# Patient Record
Sex: Female | Born: 1965 | Race: White | Hispanic: No | Marital: Married | Smoking: Former smoker
Health system: Southern US, Community
[De-identification: ages and names within clinical notes are randomized; demographics above are authoritative.]

## PROBLEM LIST (undated history)

## (undated) DIAGNOSIS — U071 COVID-19: Secondary | ICD-10-CM

## (undated) DIAGNOSIS — N200 Calculus of kidney: Secondary | ICD-10-CM

## (undated) DIAGNOSIS — R112 Nausea with vomiting, unspecified: Secondary | ICD-10-CM

## (undated) DIAGNOSIS — G43909 Migraine, unspecified, not intractable, without status migrainosus: Secondary | ICD-10-CM

## (undated) DIAGNOSIS — Z9889 Other specified postprocedural states: Secondary | ICD-10-CM

## (undated) DIAGNOSIS — I509 Heart failure, unspecified: Secondary | ICD-10-CM

## (undated) DIAGNOSIS — I10 Essential (primary) hypertension: Secondary | ICD-10-CM

## (undated) DIAGNOSIS — K589 Irritable bowel syndrome without diarrhea: Secondary | ICD-10-CM

## (undated) DIAGNOSIS — Z22322 Carrier or suspected carrier of Methicillin resistant Staphylococcus aureus: Secondary | ICD-10-CM

## (undated) DIAGNOSIS — J189 Pneumonia, unspecified organism: Secondary | ICD-10-CM

## (undated) DIAGNOSIS — N390 Urinary tract infection, site not specified: Secondary | ICD-10-CM

## (undated) DIAGNOSIS — E039 Hypothyroidism, unspecified: Secondary | ICD-10-CM

## (undated) DIAGNOSIS — K5792 Diverticulitis of intestine, part unspecified, without perforation or abscess without bleeding: Secondary | ICD-10-CM

## (undated) DIAGNOSIS — E079 Disorder of thyroid, unspecified: Secondary | ICD-10-CM

## (undated) DIAGNOSIS — Z87442 Personal history of urinary calculi: Secondary | ICD-10-CM

## (undated) DIAGNOSIS — E785 Hyperlipidemia, unspecified: Secondary | ICD-10-CM

## (undated) DIAGNOSIS — K219 Gastro-esophageal reflux disease without esophagitis: Secondary | ICD-10-CM

## (undated) DIAGNOSIS — E119 Type 2 diabetes mellitus without complications: Secondary | ICD-10-CM

## (undated) HISTORY — PX: TUBAL LIGATION: SHX77

## (undated) HISTORY — DX: Disorder of thyroid, unspecified: E07.9

## (undated) HISTORY — DX: Irritable bowel syndrome, unspecified: K58.9

## (undated) HISTORY — DX: Diverticulitis of intestine, part unspecified, without perforation or abscess without bleeding: K57.92

## (undated) HISTORY — PX: OTHER SURGICAL HISTORY: SHX169

## (undated) HISTORY — DX: Urinary tract infection, site not specified: N39.0

## (undated) HISTORY — DX: COVID-19: U07.1

## (undated) HISTORY — DX: Hyperlipidemia, unspecified: E78.5

## (undated) HISTORY — PX: TYMPANOPLASTY W/ MASTOIDECTOMY: SUR1400

## (undated) HISTORY — DX: Type 2 diabetes mellitus without complications: E11.9

## (undated) HISTORY — DX: Gastro-esophageal reflux disease without esophagitis: K21.9

## (undated) HISTORY — DX: Migraine, unspecified, not intractable, without status migrainosus: G43.909

## (undated) HISTORY — PX: WISDOM TOOTH EXTRACTION: SHX21

---
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
## 2001-08-12 HISTORY — PX: FUNCTIONAL ENDOSCOPIC SINUS SURGERY: SUR616

## 2017-01-16 ENCOUNTER — Encounter: Payer: Self-pay | Admitting: Nurse Practitioner

## 2017-01-16 ENCOUNTER — Ambulatory Visit (INDEPENDENT_AMBULATORY_CARE_PROVIDER_SITE_OTHER): Payer: Managed Care, Other (non HMO) | Admitting: Nurse Practitioner

## 2017-01-16 ENCOUNTER — Other Ambulatory Visit (INDEPENDENT_AMBULATORY_CARE_PROVIDER_SITE_OTHER): Payer: Managed Care, Other (non HMO)

## 2017-01-16 VITALS — BP 98/66 | HR 84 | Temp 97.5°F | Ht 62.0 in | Wt 164.0 lb

## 2017-01-16 DIAGNOSIS — R5383 Other fatigue: Secondary | ICD-10-CM | POA: Diagnosis not present

## 2017-01-16 DIAGNOSIS — G629 Polyneuropathy, unspecified: Secondary | ICD-10-CM | POA: Insufficient documentation

## 2017-01-16 DIAGNOSIS — M2560 Stiffness of unspecified joint, not elsewhere classified: Secondary | ICD-10-CM

## 2017-01-16 DIAGNOSIS — N951 Menopausal and female climacteric states: Secondary | ICD-10-CM

## 2017-01-16 DIAGNOSIS — M2550 Pain in unspecified joint: Secondary | ICD-10-CM

## 2017-01-16 DIAGNOSIS — E1140 Type 2 diabetes mellitus with diabetic neuropathy, unspecified: Secondary | ICD-10-CM | POA: Diagnosis not present

## 2017-01-16 DIAGNOSIS — Z780 Asymptomatic menopausal state: Secondary | ICD-10-CM

## 2017-01-16 DIAGNOSIS — E119 Type 2 diabetes mellitus without complications: Secondary | ICD-10-CM | POA: Insufficient documentation

## 2017-01-16 DIAGNOSIS — E538 Deficiency of other specified B group vitamins: Secondary | ICD-10-CM

## 2017-01-16 DIAGNOSIS — E559 Vitamin D deficiency, unspecified: Secondary | ICD-10-CM | POA: Insufficient documentation

## 2017-01-16 DIAGNOSIS — K219 Gastro-esophageal reflux disease without esophagitis: Secondary | ICD-10-CM

## 2017-01-16 DIAGNOSIS — E039 Hypothyroidism, unspecified: Secondary | ICD-10-CM

## 2017-01-16 DIAGNOSIS — N3010 Interstitial cystitis (chronic) without hematuria: Secondary | ICD-10-CM

## 2017-01-16 DIAGNOSIS — G43009 Migraine without aura, not intractable, without status migrainosus: Secondary | ICD-10-CM | POA: Diagnosis not present

## 2017-01-16 LAB — COMPREHENSIVE METABOLIC PANEL
ALT: 38 U/L — ABNORMAL HIGH (ref 0–35)
AST: 25 U/L (ref 0–37)
Albumin: 4.6 g/dL (ref 3.5–5.2)
Alkaline Phosphatase: 89 U/L (ref 39–117)
BUN: 14 mg/dL (ref 6–23)
CO2: 25 mEq/L (ref 19–32)
Calcium: 10 mg/dL (ref 8.4–10.5)
Chloride: 105 mEq/L (ref 96–112)
Creatinine, Ser: 0.67 mg/dL (ref 0.40–1.20)
GFR: 99.09 mL/min (ref >60.00)
Glucose, Bld: 165 mg/dL — ABNORMAL HIGH (ref 70–99)
Potassium: 3.9 mEq/L (ref 3.5–5.1)
Sodium: 139 mEq/L (ref 135–145)
Total Bilirubin: 0.4 mg/dL (ref 0.2–1.2)
Total Protein: 7.3 g/dL (ref 6.0–8.3)

## 2017-01-16 LAB — CBC WITH DIFFERENTIAL/PLATELET
Basophils Absolute: 0 10*3/uL (ref 0.0–0.1)
Basophils Relative: 0.7 % (ref 0.0–3.0)
Eosinophils Absolute: 0.2 10*3/uL (ref 0.0–0.7)
Eosinophils Relative: 3.1 % (ref 0.0–5.0)
HCT: 43.6 % (ref 36.0–46.0)
Hemoglobin: 14.9 g/dL (ref 12.0–15.0)
Lymphocytes Relative: 33.4 % (ref 12.0–46.0)
Lymphs Abs: 2.2 10*3/uL (ref 0.7–4.0)
MCHC: 34.3 g/dL (ref 30.0–36.0)
MCV: 92.7 fl (ref 78.0–100.0)
Monocytes Absolute: 0.5 10*3/uL (ref 0.1–1.0)
Monocytes Relative: 7.8 % (ref 3.0–12.0)
Neutro Abs: 3.7 10*3/uL (ref 1.4–7.7)
Neutrophils Relative %: 55 % (ref 43.0–77.0)
Platelets: 329 10*3/uL (ref 150.0–400.0)
RBC: 4.7 Mil/uL (ref 3.87–5.11)
RDW: 13.1 % (ref 11.5–15.5)
WBC: 6.7 10*3/uL (ref 4.0–10.5)

## 2017-01-16 LAB — VITAMIN B12: Vitamin B-12: 365 pg/mL (ref 211–911)

## 2017-01-16 LAB — HEMOGLOBIN A1C: Hgb A1c MFr Bld: 7.1 % — ABNORMAL HIGH (ref 4.6–6.5)

## 2017-01-16 LAB — SEDIMENTATION RATE: Sed Rate: 6 mm/hr (ref 0–20)

## 2017-01-16 LAB — T4, FREE: Free T4: 0.66 ng/dL (ref 0.60–1.60)

## 2017-01-16 LAB — URIC ACID: Uric Acid, Serum: 4.8 mg/dL (ref 2.4–7.0)

## 2017-01-16 LAB — TSH: TSH: 0.25 u[IU]/mL — ABNORMAL LOW (ref 0.35–4.50)

## 2017-01-16 MED ORDER — BUTALBITAL-APAP-CAFFEINE 50-300-40 MG PO CAPS: 1.0000 | ORAL_CAPSULE | Freq: Four times a day (QID) | ORAL | 0 refills | Status: DC | PRN

## 2017-01-16 MED ORDER — OMEPRAZOLE 40 MG PO CPDR: 40.0000 mg | DELAYED_RELEASE_CAPSULE | Freq: Every day | ORAL | 0 refills | Status: DC

## 2017-01-16 NOTE — Progress Notes (Signed)
Subjective:    Patient ID: Caitlin Barnett, female    DOB: Jan 05, 1966, 51 y.o.   MRN: [id number]  Patient presents today for establish care (new patient)  HPI  joint pain and stiffness, swelling  x 3weeks. She is concerned about autoimmune cause.  Synthroid changed to levothyroxine 3weeks ago.  Labs last done 11/2016 by previous pcp: Last HgbA1c 6.14, Vit. B12 300. (per patient)  Moved from [place] to [place] 5days ago.  Report hx of interstitial cystitis Will like referral to urology.  Immunizations: (TDAP, Hep C screen, Pneumovax, Influenza, zoster)  Health Maintenance  Topic Date Due  . Pneumococcal vaccine (1) 03/22/1969  . Complete foot exam   03/22/1977  . Eye exam for diabetics  03/22/1977  . Urine Protein Check  03/22/1977  . HIV Screening  03/22/1982  . Pap Smear  03/22/1988  . Tetanus Vaccine  01/16/2018*  . Flu Shot  03/12/2017  . Hemoglobin A1C  07/18/2017  *Topic was postponed. The date shown is not the original due date.   Diet:regular.  Weight:  Wt Readings from Last 3 Encounters:  01/16/17 164 lb (74.4 kg)   Exercise:none.  Fall Risk: Fall Risk  01/16/2017  Falls in the past year? No   Home Safety:home with husband.  Depression/Suicide: Depression screen PHQ 2/9 01/16/2017  Decreased Interest 0  Down, Depressed, Hopeless 0  PHQ - 2 Score 0   Pap Smear (every 54yr for >21-29 without HPV, every 546yrfor >30-6531yrith HPV):needed, no hx of abnormal.  Mammogram (yearly, >45y40yreeded, no hx of abnormal.  Sexual History (birth control, marital status, STD):married, sexually active, works as HomeOccupational hygienistedications and allergies reviewed with patient and updated if appropriate.  Patient Active Problem List   Diagnosis Date Noted  . Hypothyroidism 01/16/2017  . Type 2 diabetes mellitus with diabetic neuropathy, without long-term current use of insulin (HCC)[place]/02/2017  . Vitamin D deficiency 01/16/2017  . Postmenopausal 01/16/2017  . Hot  flashes due to menopause 01/16/2017  . Vitamin B12 deficiency 01/16/2017  . Interstitial cystitis 01/16/2017  . Peripheral neuropathy 01/16/2017    No current outpatient prescriptions on file prior to visit.   No current facility-administered medications on file prior to visit.     Past Medical History:  Diagnosis Date  . Diabetes mellitus without complication (HCC)[place]. Diverticulitis   . GERD (gastroesophageal reflux disease)   . Hyperlipidemia   . IBS (irritable bowel syndrome)   . Migraine   . Thyroid disease   . UTI (urinary tract infection)     Past Surgical History:  Procedure Laterality Date  . CESAREAN SECTION  1993  . cystoscopies     multiple  . FUNCTIONAL ENDOSCOPIC SINUS SURGERY  2003  . LAPAROSCOPIC OVARIAN CYSTECTOMY  1983  . TONSILLECTOMY  1973  . TYMPANOPLASTY W/ MASTOIDECTOMY Left 2016 & 2017    Social History   Social History  . Marital status: Married    Spouse name: N/A  . Number of children: N/A  . Years of education: N/A   Social History Main Topics  . Smoking status: Current Every Day Smoker    Types: Cigarettes  . Smokeless tobacco: Never Used  . Alcohol use No  . Drug use: No  . Sexual activity: Not Asked   Other Topics Concern  . None   Social History Narrative  . None    Family History  Problem Relation Age of Onset  . Mental illness Mother  depression  . Alcohol abuse Father   . Kidney disease Father   . Mental illness Father        depression  . Cancer Maternal Aunt        breast cancer  . Heart disease Maternal Grandmother         Review of Systems  Constitutional: Positive for malaise/fatigue. Negative for fever and weight loss.  HENT: Negative for congestion and sore throat.   Eyes:       Negative for visual changes  Respiratory: Negative for cough and shortness of breath.   Cardiovascular: Negative for chest pain, palpitations and leg swelling.  Gastrointestinal: Negative for blood in stool,  constipation, diarrhea and heartburn.  Genitourinary: Negative for dysuria, frequency and urgency.  Musculoskeletal: Positive for joint pain, myalgias and neck pain. Negative for falls.  Skin: Negative for rash.  Neurological: Negative for dizziness, sensory change and headaches.  Endo/Heme/Allergies: Does not bruise/bleed easily.  Psychiatric/Behavioral: Negative for depression, substance abuse and suicidal ideas. The patient is not nervous/anxious.     Objective:   Vitals:   01/16/17 1118  BP: 98/66  Pulse: 84  Temp: 97.5 F (36.4 C)    Body mass index is 30 kg/m.   Physical Examination:  Physical Exam  Constitutional: She is oriented to person, place, and time and well-developed, well-nourished, and in no distress. No distress.  HENT:  Right Ear: External ear normal.  Left Ear: External ear normal.  Nose: Nose normal.  Mouth/Throat: Oropharynx is clear and moist. No oropharyngeal exudate.  Eyes: Conjunctivae and EOM are normal. Pupils are equal, round, and reactive to light. No scleral icterus.  Neck: Normal range of motion. Neck supple. No thyromegaly present.  Cardiovascular: Normal rate, normal heart sounds and intact distal pulses.   Pulmonary/Chest: Effort normal and breath sounds normal. She exhibits no tenderness.  Abdominal: Soft. Bowel sounds are normal. She exhibits no distension. There is no tenderness.  Musculoskeletal: Normal range of motion. She exhibits no edema or tenderness.  Lymphadenopathy:    She has no cervical adenopathy.  Neurological: She is alert and oriented to person, place, and time. Gait normal.  Skin: Skin is warm and dry.  Psychiatric: Affect and judgment normal.    ASSESSMENT and PLAN:  Suriya was seen today for establish care.  Diagnoses and all orders for this visit:  Joint stiffness -     CBC w/Diff; Future -     Sed Rate (ESR); Future -     Uric acid; Future -     Rheumatoid Factor; Future -     Comprehensive metabolic  panel; Future -     CK Total (and CKMB); Future  Arthralgia, unspecified joint -     CBC w/Diff; Future -     Sed Rate (ESR); Future -     Uric acid; Future -     Rheumatoid Factor; Future -     Comprehensive metabolic panel; Future -     CK Total (and CKMB); Future  Type 2 diabetes mellitus with diabetic neuropathy, without long-term current use of insulin (HCC) -     Hemoglobin A1c; Future  Hypothyroidism, unspecified type -     TSH; Future -     T4, free; Future  Vitamin D deficiency -     Vitamin D 1,25 dihydroxy; Future  Fatigue, unspecified type -     CBC w/Diff; Future -     Sed Rate (ESR); Future -     Comprehensive metabolic panel;  Future -     CK Total (and CKMB); Future  Vitamin B12 deficiency -     B12; Future  Interstitial cystitis -     Ambulatory referral to Urology  Peripheral polyneuropathy  Postmenopausal  Hot flashes due to menopause  Migraine without aura and without status migrainosus, not intractable -     Butalbital-APAP-Caffeine (FIORICET) 50-300-40 MG CAPS; Take 1 tablet by mouth 4 (four) times daily as needed. migrains  Gastroesophageal reflux disease without esophagitis -     omeprazole (PRILOSEC) 40 MG capsule; Take 1 capsule (40 mg total) by mouth daily.   No problem-specific Assessment & Plan notes found for this encounter.     Follow up: Return in about 4 weeks (around 02/13/2017) for CPE (fasting, breast and pap needed).  Wilfred Lacy, NP

## 2017-01-16 NOTE — Patient Instructions (Signed)
Go to basement for blood draw. You will be contacted with results. 

## 2017-01-17 LAB — CK TOTAL AND CKMB (NOT AT ARMC)
CK, MB: <0.7 ng/mL (ref 0.0–5.0)
Total CK: 42 U/L (ref 29–143)

## 2017-01-17 LAB — RHEUMATOID FACTOR: Rhuematoid fact SerPl-aCnc: <14 IU/mL (ref <14)

## 2017-01-21 ENCOUNTER — Telehealth: Payer: Self-pay | Admitting: Nurse Practitioner

## 2017-01-21 DIAGNOSIS — Z780 Asymptomatic menopausal state: Secondary | ICD-10-CM

## 2017-01-21 DIAGNOSIS — N951 Menopausal and female climacteric states: Secondary | ICD-10-CM

## 2017-01-21 LAB — VITAMIN D 1,25 DIHYDROXY
Vitamin D 1, 25 (OH)2 Total: 41 pg/mL (ref 18–72)
Vitamin D2 1, 25 (OH)2: <8 pg/mL
Vitamin D3 1, 25 (OH)2: 41 pg/mL

## 2017-01-21 MED ORDER — CONJ ESTROG-MEDROXYPROGEST ACE 0.45-1.5 MG PO TABS: 1.0000 | ORAL_TABLET | Freq: Every day | ORAL | 1 refills | Status: DC

## 2017-01-21 NOTE — Telephone Encounter (Signed)
rx sent

## 2017-01-21 NOTE — Telephone Encounter (Signed)
Please call patient

## 2017-01-21 NOTE — Telephone Encounter (Signed)
Pt called in and would someone to call her about her lab results   She also has info about her [facility] [address]

## 2017-01-24 ENCOUNTER — Telehealth: Payer: Self-pay | Admitting: Nurse Practitioner

## 2017-01-24 NOTE — Telephone Encounter (Signed)
ROI fax to Dr.Richard Alesia Bandaizzo

## 2017-01-28 ENCOUNTER — Encounter: Payer: Self-pay | Admitting: Nurse Practitioner

## 2017-01-28 ENCOUNTER — Ambulatory Visit (INDEPENDENT_AMBULATORY_CARE_PROVIDER_SITE_OTHER): Payer: Managed Care, Other (non HMO) | Admitting: Nurse Practitioner

## 2017-01-28 VITALS — BP 96/58 | HR 84 | Temp 98.8°F | Ht 62.0 in | Wt 168.0 lb

## 2017-01-28 DIAGNOSIS — R5383 Other fatigue: Secondary | ICD-10-CM

## 2017-01-28 DIAGNOSIS — M2560 Stiffness of unspecified joint, not elsewhere classified: Secondary | ICD-10-CM | POA: Diagnosis not present

## 2017-01-28 DIAGNOSIS — M2550 Pain in unspecified joint: Secondary | ICD-10-CM | POA: Diagnosis not present

## 2017-01-28 MED ORDER — MELOXICAM 7.5 MG PO TABS: 7.5000 mg | ORAL_TABLET | Freq: Every day | ORAL | 0 refills | Status: DC | PRN

## 2017-01-28 MED ORDER — PREGABALIN 75 MG PO CAPS: 75.0000 mg | ORAL_CAPSULE | Freq: Two times a day (BID) | ORAL | 0 refills | Status: DC

## 2017-01-28 NOTE — Patient Instructions (Addendum)
You will be contacted to schedule appt with rheumatology.  Stop gabapentin, and start lyrica.  Reschedule CPE to match follow up visit today.  Pregabalin capsules What is this medicine? PREGABALIN (pre GAB a lin) is used to treat nerve pain from diabetes, shingles, spinal cord injury, and fibromyalgia. It is also used to control seizures in epilepsy. This medicine may be used for other purposes; ask your health care provider or pharmacist if you have questions. COMMON BRAND NAME(S): Lyrica What should I tell my health care provider before I take this medicine? They need to know if you have any of these conditions: -bleeding problems -heart disease, including heart failure -history of alcohol or drug abuse -kidney disease -suicidal thoughts, plans, or attempt; a previous suicide attempt by you or a family member -an unusual or allergic reaction to pregabalin, gabapentin, other medicines, foods, dyes, or preservatives -pregnant or trying to get pregnant or trying to conceive with your partner -breast-feeding How should I use this medicine? Take this medicine by mouth with a glass of water. Follow the directions on the prescription label. You can take this medicine with or without food. Take your doses at regular intervals. Do not take your medicine more often than directed. Do not stop taking except on your doctor's advice. A special MedGuide will be given to you by the pharmacist with each prescription and refill. Be sure to read this information carefully each time. Talk to your pediatrician regarding the use of this medicine in children. Special care may be needed. Overdosage: If you think you have taken too much of this medicine contact a poison control center or emergency room at once. NOTE: This medicine is only for you. Do not share this medicine with others. What if I miss a dose? If you miss a dose, take it as soon as you can. If it is almost time for your next dose, take only that  dose. Do not take double or extra doses. What may interact with this medicine? -alcohol -certain medicines for blood pressure like captopril, enalapril, or lisinopril -certain medicines for diabetes, like pioglitazone or rosiglitazone -certain medicines for anxiety or sleep -narcotic medicines for pain This list may not describe all possible interactions. Give your health care provider a list of all the medicines, herbs, non-prescription drugs, or dietary supplements you use. Also tell them if you smoke, drink alcohol, or use illegal drugs. Some items may interact with your medicine. What should I watch for while using this medicine? Tell your doctor or healthcare professional if your symptoms do not start to get better or if they get worse. Visit your doctor or health care professional for regular checks on your progress. Do not stop taking except on your doctor's advice. You may develop a severe reaction. Your doctor will tell you how much medicine to take. Wear a medical identification bracelet or chain if you are taking this medicine for seizures, and carry a card that describes your disease and details of your medicine and dosage times. You may get drowsy or dizzy. Do not drive, use machinery, or do anything that needs mental alertness until you know how this medicine affects you. Do not stand or sit up quickly, especially if you are an older patient. This reduces the risk of dizzy or fainting spells. Alcohol may interfere with the effect of this medicine. Avoid alcoholic drinks. If you have a heart condition, like congestive heart failure, and notice that you are retaining water and have swelling in your hands  or feet, contact your health care provider immediately. The use of this medicine may increase the chance of suicidal thoughts or actions. Pay special attention to how you are responding while on this medicine. Any worsening of mood, or thoughts of suicide or dying should be reported to your  health care professional right away. This medicine has caused reduced sperm counts in some men. This may interfere with the ability to father a child. You should talk to your doctor or health care professional if you are concerned about your fertility. Women who become pregnant while using this medicine for seizures may enroll in the [place] American Antiepileptic Drug Pregnancy Registry by calling ([phone]. This registry collects information about the safety of antiepileptic drug use during pregnancy. What side effects may I notice from receiving this medicine? Side effects that you should report to your doctor or health care professional as soon as possible: -allergic reactions like skin rash, itching or hives, swelling of the face, lips, or tongue -breathing problems -changes in vision -chest pain -confusion -jerking or unusual movements of any part of your body -loss of memory -muscle pain, tenderness, or weakness -suicidal thoughts or other mood changes -swelling of the ankles, feet, hands -unusual bruising or bleeding Side effects that usually do not require medical attention (report to your doctor or health care professional if they continue or are bothersome): -dizziness -drowsiness -dry mouth -headache -nausea -tremors -trouble sleeping -weight gain This list may not describe all possible side effects. Call your doctor for medical advice about side effects. You may report side effects to FDA at 1-800-FDA-1088. Where should I keep my medicine? Keep out of the reach of children. This medicine can be abused. Keep your medicine in a safe place to protect it from theft. Do not share this medicine with anyone. Selling or giving away this medicine is dangerous and against the law. This medicine may cause accidental overdose and death if it taken by other adults, children, or pets. Mix any unused medicine with a substance like cat litter or coffee grounds. Then throw the medicine  away in a sealed container like a sealed bag or a coffee can with a lid. Do not use the medicine after the expiration date. Store at room temperature between 15 and 30 degrees C (59 and 86 degrees F). NOTE: This sheet is a summary. It may not cover all possible information. If you have questions about this medicine, talk to your doctor, pharmacist, or health care provider.  2018 Elsevier/Gold Standard (2015-08-31 10:26:12)

## 2017-01-28 NOTE — Progress Notes (Signed)
Subjective:  Patient ID: Caitlin Barnett, female    DOB: 03/14/1966  Age: 51 y.o. MRN: [id number]  CC: Follow-up (go over lab and joint pain consult--still in pain but the lab was [facility])   HPI  Joint pain: Worsening in last 2months. No improvement with gabapentin QID. Interfering with ADLs. Takes 1hour to get ready in morning. No paresthesia.  Outpatient Medications Prior to Visit  Medication Sig Dispense Refill  . Butalbital-APAP-Caffeine (FIORICET) 50-300-40 MG CAPS Take 1 tablet by mouth 4 (four) times daily as needed. migrains 30 capsule 0  . cetirizine (ZYRTEC) 10 MG tablet Take 10 mg by mouth daily.    Marland Kitchen. estrogen, conjugated,-medroxyprogesterone (PREMPRO) 0.45-1.5 MG tablet Take 1 tablet by mouth daily. 30 tablet 1  . fluticasone (FLONASE) 50 MCG/ACT nasal spray Place into both nostrils daily.    Marland Kitchen. levothyroxine (SYNTHROID, LEVOTHROID) 125 MCG tablet Take 125 mcg by mouth daily before breakfast.    . metFORMIN (GLUCOPHAGE) 500 MG tablet Take by mouth 2 (two) times daily with a meal.    . nitrofurantoin (MACRODANTIN) 100 MG capsule Take 100 mg by mouth 4 (four) times daily. 3 times a week PRN    . omeprazole (PRILOSEC) 40 MG capsule Take 1 capsule (40 mg total) by mouth daily. 90 capsule 0  . gabapentin (NEURONTIN) 300 MG capsule Take 300 mg by mouth 4 (four) times daily.    . cyanocobalamin 1000 MCG tablet Take 1,000 mcg by mouth daily. Once monthly     No facility-administered medications prior to visit.     ROS See HPI  Objective:  BP (!) 96/58   Pulse 84   Temp 98.8 F (37.1 C)   Ht 5\' 2"  (1.575 m)   Wt 168 lb (76.2 kg)   SpO2 99%   BMI 30.73 kg/m   BP Readings from Last 3 Encounters:  01/28/17 (!) 96/58  01/16/17 98/66    Wt Readings from Last 3 Encounters:  01/28/17 168 lb (76.2 kg)  01/16/17 164 lb (74.4 kg)    Physical Exam  Constitutional: She is oriented to person, place, and time. No distress.  Eyes: No scleral icterus.  Neck: Normal range of  motion. Neck supple.  Cardiovascular: Normal rate.   Pulmonary/Chest: Effort normal. No respiratory distress.  Musculoskeletal: She exhibits no edema.  Neurological: She is alert and oriented to person, place, and time.  Skin: Skin is warm and dry. No rash noted. No erythema.  Vitals reviewed.   Lab Results  Component Value Date   WBC 6.7 01/16/2017   HGB 14.9 01/16/2017   HCT 43.6 01/16/2017   PLT 329.0 01/16/2017   GLUCOSE 165 (H) 01/16/2017   ALT 38 (H) 01/16/2017   AST 25 01/16/2017   NA 139 01/16/2017   K 3.9 01/16/2017   CL 105 01/16/2017   CREATININE 0.67 01/16/2017   BUN 14 01/16/2017   CO2 25 01/16/2017   TSH 0.25 (L) 01/16/2017   HGBA1C 7.1 (H) 01/16/2017    Patient was never admitted.  Assessment & Plan:   Caitlin Barnett was seen today for follow-up.  Diagnoses and all orders for this visit:  Arthralgia, unspecified joint -     meloxicam (MOBIC) 7.5 MG tablet; Take 1 tablet (7.5 mg total) by mouth daily as needed for pain. -     pregabalin (LYRICA) 75 MG capsule; Take 1 capsule (75 mg total) by mouth 2 (two) times daily. -     Ambulatory referral to Rheumatology  Joint stiffness -  meloxicam (MOBIC) 7.5 MG tablet; Take 1 tablet (7.5 mg total) by mouth daily as needed for pain. -     pregabalin (LYRICA) 75 MG capsule; Take 1 capsule (75 mg total) by mouth 2 (two) times daily. -     Ambulatory referral to Rheumatology  Fatigue, unspecified type -     meloxicam (MOBIC) 7.5 MG tablet; Take 1 tablet (7.5 mg total) by mouth daily as needed for pain. -     pregabalin (LYRICA) 75 MG capsule; Take 1 capsule (75 mg total) by mouth 2 (two) times daily. -     Ambulatory referral to Rheumatology   I have discontinued Caitlin Barnett gabapentin. I am also having her start on meloxicam and pregabalin. Additionally, I am having her maintain her metFORMIN, cyanocobalamin, levothyroxine, cetirizine, fluticasone, nitrofurantoin, omeprazole, Butalbital-APAP-Caffeine, estrogen  (conjugated)-medroxyprogesterone, and cyanocobalamin.  Meds ordered this encounter  Medications  . cyanocobalamin (,VITAMIN B-12,) 1000 MCG/ML injection    Sig: Inject 1,000 mcg into the muscle.    Refill:  11  . meloxicam (MOBIC) 7.5 MG tablet    Sig: Take 1 tablet (7.5 mg total) by mouth daily as needed for pain.    Dispense:  30 tablet    Refill:  0    Order Specific Question:   Supervising Provider    Answer:   Tresa Garter [[id number]]  . pregabalin (LYRICA) 75 MG capsule    Sig: Take 1 capsule (75 mg total) by mouth 2 (two) times daily.    Dispense:  60 capsule    Refill:  0    Order Specific Question:   Supervising Provider    Answer:   Tresa Garter [[id number]]    Follow-up: Return in about 4 weeks (around 02/25/2017) for fatigue, joint and muscle pain.  Alysia Penna, NP

## 2017-02-07 ENCOUNTER — Ambulatory Visit: Payer: Managed Care, Other (non HMO) | Admitting: Nurse Practitioner

## 2017-02-24 ENCOUNTER — Ambulatory Visit (INDEPENDENT_AMBULATORY_CARE_PROVIDER_SITE_OTHER): Payer: Managed Care, Other (non HMO) | Admitting: Nurse Practitioner

## 2017-02-24 ENCOUNTER — Other Ambulatory Visit (HOSPITAL_COMMUNITY)
Admission: RE | Admit: 2017-02-24 | Discharge: 2017-02-24 | Disposition: A | Discharge status: Home / Self Care | Payer: Managed Care, Other (non HMO) | Source: Ambulatory Visit | Attending: Nurse Practitioner | Admitting: Nurse Practitioner

## 2017-02-24 ENCOUNTER — Other Ambulatory Visit: Payer: Managed Care, Other (non HMO)

## 2017-02-24 ENCOUNTER — Encounter: Payer: Self-pay | Admitting: Nurse Practitioner

## 2017-02-24 VITALS — BP 100/68 | HR 88 | Temp 98.3°F | Ht 62.0 in | Wt 170.0 lb

## 2017-02-24 DIAGNOSIS — H66005 Acute suppurative otitis media without spontaneous rupture of ear drum, recurrent, left ear: Secondary | ICD-10-CM

## 2017-02-24 DIAGNOSIS — Z124 Encounter for screening for malignant neoplasm of cervix: Secondary | ICD-10-CM | POA: Diagnosis not present

## 2017-02-24 DIAGNOSIS — R8789 Other abnormal findings in specimens from female genital organs: Secondary | ICD-10-CM

## 2017-02-24 DIAGNOSIS — M2560 Stiffness of unspecified joint, not elsewhere classified: Secondary | ICD-10-CM | POA: Diagnosis not present

## 2017-02-24 DIAGNOSIS — N951 Menopausal and female climacteric states: Secondary | ICD-10-CM

## 2017-02-24 DIAGNOSIS — R87618 Other abnormal cytological findings on specimens from cervix uteri: Secondary | ICD-10-CM

## 2017-02-24 DIAGNOSIS — R87810 Cervical high risk human papillomavirus (HPV) DNA test positive: Secondary | ICD-10-CM | POA: Diagnosis not present

## 2017-02-24 DIAGNOSIS — K219 Gastro-esophageal reflux disease without esophagitis: Secondary | ICD-10-CM | POA: Diagnosis not present

## 2017-02-24 DIAGNOSIS — R5383 Other fatigue: Secondary | ICD-10-CM | POA: Diagnosis not present

## 2017-02-24 DIAGNOSIS — H60332 Swimmer's ear, left ear: Secondary | ICD-10-CM | POA: Diagnosis not present

## 2017-02-24 DIAGNOSIS — M2550 Pain in unspecified joint: Secondary | ICD-10-CM

## 2017-02-24 DIAGNOSIS — Z780 Asymptomatic menopausal state: Secondary | ICD-10-CM | POA: Diagnosis not present

## 2017-02-24 DIAGNOSIS — Z0001 Encounter for general adult medical examination with abnormal findings: Secondary | ICD-10-CM | POA: Insufficient documentation

## 2017-02-24 MED ORDER — AMOXICILLIN-POT CLAVULANATE 875-125 MG PO TABS: 1.0000 | ORAL_TABLET | Freq: Two times a day (BID) | ORAL | 0 refills | Status: DC

## 2017-02-24 MED ORDER — PREGABALIN 75 MG PO CAPS: 75.0000 mg | ORAL_CAPSULE | Freq: Two times a day (BID) | ORAL | 2 refills | Status: DC

## 2017-02-24 MED ORDER — CONJ ESTROG-MEDROXYPROGEST ACE 0.45-1.5 MG PO TABS: 1.0000 | ORAL_TABLET | Freq: Every day | ORAL | 2 refills | Status: DC

## 2017-02-24 MED ORDER — OMEPRAZOLE 40 MG PO CPDR: 40.0000 mg | DELAYED_RELEASE_CAPSULE | Freq: Every day | ORAL | 1 refills | Status: DC

## 2017-02-24 MED ORDER — CIPROFLOXACIN-DEXAMETHASONE 0.3-0.1 % OT SUSP: 4.0000 [drp] | Freq: Two times a day (BID) | OTIC | 0 refills | Status: DC

## 2017-02-24 NOTE — Progress Notes (Signed)
Subjective:  Patient ID: Caitlin Barnett, female    DOB: 06-Jun-1966  Age: 51 y.o. MRN: [id number]  CC: Annual Exam (4 wk follow, med help/pap? left ear pain?)   Otalgia   There is pain in the left ear. This is a recurrent problem. The current episode started in the past 7 days. The problem has been gradually worsening. There has been no fever. Associated symptoms include ear discharge and hearing loss. Pertinent negatives include no abdominal pain, coughing, diarrhea, headaches, neck pain, rash, rhinorrhea, sore throat or vomiting. She has tried nothing for the symptoms. Her past medical history is significant for a chronic ear infection, hearing loss and a tympanostomy tube[place]  Joint and muscle pain: Improved with lyrica. Prn use of meloxicam. Does not want to proceed with rheumatology appt at this time.  Postmenopausal symptoms: Denies nay adverse effects with use of prempro.  Depression screen PHQ 2/9 01/16/2017  Decreased Interest 0  Down, Depressed, Hopeless 0  PHQ - 2 Score 0   Diet: regular.  Exercise: none  Past Medical History:  Diagnosis Date  . Diabetes mellitus without complication (HCC)   . Diverticulitis   . GERD (gastroesophageal reflux disease)   . Hyperlipidemia   . IBS (irritable bowel syndrome)   . Migraine   . Thyroid disease   . UTI (urinary tract infection)    Medications reviewed: Outpatient Medications Prior to Visit  Medication Sig Dispense Refill  . Butalbital-APAP-Caffeine (FIORICET) 50-300-40 MG CAPS Take 1 tablet by mouth 4 (four) times daily as needed. migrains 30 capsule 0  . cetirizine (ZYRTEC) 10 MG tablet Take 10 mg by mouth daily.    . cyanocobalamin (,VITAMIN B-12,) 1000 MCG/ML injection Inject 1,000 mcg into the muscle.  11  . fluticasone (FLONASE) 50 MCG/ACT nasal spray Place into both nostrils daily.    Marland Kitchen. levothyroxine (SYNTHROID, LEVOTHROID) 125 MCG tablet Take 125 mcg by mouth daily before breakfast.    .  meloxicam (MOBIC) 7.5 MG tablet Take 1 tablet (7.5 mg total) by mouth daily as needed for pain. 30 tablet 0  . metFORMIN (GLUCOPHAGE) 500 MG tablet Take by mouth 2 (two) times daily with a meal.    . nitrofurantoin (MACRODANTIN) 100 MG capsule Take 100 mg by mouth 4 (four) times daily. 3 times a week PRN    . estrogen, conjugated,-medroxyprogesterone (PREMPRO) 0.45-1.5 MG tablet Take 1 tablet by mouth daily. 30 tablet 1  . omeprazole (PRILOSEC) 40 MG capsule Take 1 capsule (40 mg total) by mouth daily. 90 capsule 0  . pregabalin (LYRICA) 75 MG capsule Take 1 capsule (75 mg total) by mouth 2 (two) times daily. 60 capsule 0  . cyanocobalamin 1000 MCG tablet Take 1,000 mcg by mouth daily. Once monthly     No facility-administered medications prior to visit.    Family History  Problem Relation Age of Onset  . Mental illness Mother        depression  . Alcohol abuse Father   . Kidney disease Father   . Mental illness Father        depression  . Cancer Maternal Aunt        breast cancer  . Heart disease Maternal Grandmother    Social History   Social History  . Marital status: Married    Spouse name: N/A  . Number of children: N/A  . Years of education: N/A   Social History Main Topics  . Smoking status: Current Every Day  Smoker    Types: Cigarettes  . Smokeless tobacco: Never Used  . Alcohol use No  . Drug use: No  . Sexual activity: Not Asked   Other Topics Concern  . None   Social History Narrative  . None   ROS Review of Systems  Constitutional: Negative.   HENT: Positive for ear discharge, ear pain and hearing loss. Negative for rhinorrhea and sore throat.   Eyes: Negative for blurred vision.  Respiratory: Negative.  Negative for cough.   Cardiovascular: Negative.   Gastrointestinal: Negative.  Negative for abdominal pain, diarrhea and vomiting.  Musculoskeletal: Positive for joint pain and myalgias. Negative for falls and neck pain.  Skin: Negative.  Negative for  rash.  Neurological: Negative.  Negative for headaches.  Psychiatric/Behavioral: Negative.     Objective:  BP 100/68   Pulse 88   Temp 98.3 F (36.8 C)   Ht 5\' 2"  (1.575 m)   Wt 170 lb (77.1 kg)   SpO2 98%   BMI 31.09 kg/m   BP Readings from Last 3 Encounters:  02/24/17 100/68  01/28/17 (!) 96/58  01/16/17 98/66    Wt Readings from Last 3 Encounters:  02/24/17 170 lb (77.1 kg)  01/28/17 168 lb (76.2 kg)  01/16/17 164 lb (74.4 kg)    Physical Exam  Constitutional: She is oriented to person, place, and time. No distress.  HENT:  Right Ear: External ear normal. No drainage or tenderness. No mastoid tenderness. Tympanic membrane is not injected, not erythematous and not bulging. No middle ear effusion.  Left Ear: There is drainage. No swelling. No mastoid tenderness. Tympanic membrane is injected, erythematous and bulging. A middle ear effusion is present.  Nose: Nose normal. No mucosal edema or rhinorrhea. Right sinus exhibits no maxillary sinus tenderness and no frontal sinus tenderness. Left sinus exhibits no maxillary sinus tenderness and no frontal sinus tenderness.  Mouth/Throat: Mucous membranes are normal. No oropharyngeal exudate or posterior oropharyngeal erythema.  Eyes: Pupils are equal, round, and reactive to light. Conjunctivae and EOM are normal. No scleral icterus.  Neck: Normal range of motion. Neck supple. No thyromegaly present.  Cardiovascular: Normal rate, regular rhythm and normal heart sounds.   Pulmonary/Chest: Effort normal and breath sounds normal. No respiratory distress. She exhibits no mass and no tenderness. Right breast exhibits no inverted nipple, no mass, no nipple discharge, no skin change and no tenderness. Left breast exhibits no inverted nipple, no mass, no nipple discharge, no skin change and no tenderness. Breasts are symmetrical.  Abdominal: Soft. She exhibits no distension. Hernia confirmed negative in the right inguinal area and confirmed  negative in the left inguinal area.  Genitourinary: No breast swelling, tenderness or discharge. Pelvic exam was performed with patient supine. There is no rash, tenderness or lesion on the right labia. There is no rash, tenderness or lesion on the left labia. No erythema or tenderness in the vagina. Vaginal discharge found.  Musculoskeletal: Normal range of motion. She exhibits no edema or tenderness.  Lymphadenopathy:    She has cervical adenopathy.       Right: No inguinal adenopathy present.       Left: No inguinal adenopathy present.  Neurological: She is alert and oriented to person, place, and time.  Skin: Skin is warm and dry.  Psychiatric: She has a normal mood and affect. Her behavior is normal.  Vitals reviewed.   Lab Results  Component Value Date   WBC 6.7 01/16/2017   HGB 14.9 01/16/2017  HCT 43.6 01/16/2017   PLT 329.0 01/16/2017   GLUCOSE 165 (H) 01/16/2017   ALT 38 (H) 01/16/2017   AST 25 01/16/2017   NA 139 01/16/2017   K 3.9 01/16/2017   CL 105 01/16/2017   CREATININE 0.67 01/16/2017   BUN 14 01/16/2017   CO2 25 01/16/2017   TSH 0.25 (L) 01/16/2017   HGBA1C 7.1 (H) 01/16/2017    Assessment & Plan:   Feliciana was seen today for annual exam.  Diagnoses and all orders for this visit:  Encounter for preventative adult health care exam with abnormal findings -     Cytology - PAP; Future  Recurrent acute suppurative otitis media without spontaneous rupture of left tympanic membrane -     amoxicillin-clavulanate (AUGMENTIN) 875-125 MG tablet; Take 1 tablet by mouth 2 (two) times daily. -     ciprofloxacin-dexamethasone (CIPRODEX) OTIC suspension; Place 4 drops into the left ear 2 (two) times daily.  Acute swimmer's ear of left side -     amoxicillin-clavulanate (AUGMENTIN) 875-125 MG tablet; Take 1 tablet by mouth 2 (two) times daily. -     ciprofloxacin-dexamethasone (CIPRODEX) OTIC suspension; Place 4 drops into the left ear 2 (two) times  daily.  Arthralgia, unspecified joint -     pregabalin (LYRICA) 75 MG capsule; Take 1 capsule (75 mg total) by mouth 2 (two) times daily.  Joint stiffness -     pregabalin (LYRICA) 75 MG capsule; Take 1 capsule (75 mg total) by mouth 2 (two) times daily.  Postmenopausal -     estrogen, conjugated,-medroxyprogesterone (PREMPRO) 0.45-1.5 MG tablet; Take 1 tablet by mouth daily.  Hot flashes due to menopause -     estrogen, conjugated,-medroxyprogesterone (PREMPRO) 0.45-1.5 MG tablet; Take 1 tablet by mouth daily.  Gastroesophageal reflux disease without esophagitis -     omeprazole (PRILOSEC) 40 MG capsule; Take 1 capsule (40 mg total) by mouth daily.  Fatigue, unspecified type -     pregabalin (LYRICA) 75 MG capsule; Take 1 capsule (75 mg total) by mouth 2 (two) times daily.  Encounter for Papanicolaou smear for cervical cancer screening -     Cytology - PAP; Future   I am having Ms. Ogren start on amoxicillin-clavulanate and ciprofloxacin-dexamethasone. I am also having her maintain her metFORMIN, cyanocobalamin, levothyroxine, cetirizine, fluticasone, nitrofurantoin, Butalbital-APAP-Caffeine, cyanocobalamin, meloxicam, estrogen (conjugated)-medroxyprogesterone, omeprazole, and pregabalin.  Meds ordered this encounter  Medications  . estrogen, conjugated,-medroxyprogesterone (PREMPRO) 0.45-1.5 MG tablet    Sig: Take 1 tablet by mouth daily.    Dispense:  30 tablet    Refill:  2    Order Specific Question:   Supervising Provider    Answer:   Tresa Garter [[id number]]  . omeprazole (PRILOSEC) 40 MG capsule    Sig: Take 1 capsule (40 mg total) by mouth daily.    Dispense:  90 capsule    Refill:  1    Order Specific Question:   Supervising Provider    Answer:   Tresa Garter [[id number]]  . pregabalin (LYRICA) 75 MG capsule    Sig: Take 1 capsule (75 mg total) by mouth 2 (two) times daily.    Dispense:  60 capsule    Refill:  2    Order Specific Question:   Supervising  Provider    Answer:   Tresa Garter [[id number]]  . amoxicillin-clavulanate (AUGMENTIN) 875-125 MG tablet    Sig: Take 1 tablet by mouth 2 (two) times daily.    Dispense:  20 tablet  Refill:  0    Order Specific Question:   Supervising Provider    Answer:   Tresa Garter [[id number]]  . ciprofloxacin-dexamethasone (CIPRODEX) OTIC suspension    Sig: Place 4 drops into the left ear 2 (two) times daily.    Dispense:  7.5 mL    Refill:  0    Order Specific Question:   Supervising Provider    Answer:   Tresa Garter [[id number]]    Follow-up: Return in about 6 months (around 08/27/2017) for hypothyroidism, DM and joint pain.  Alysia Penna, NP

## 2017-02-24 NOTE — Assessment & Plan Note (Signed)
Per patient's request will hold rheumatology referral at this time due to symptoms improvement with lyrica 75mg  BID and meloxicam 7.5mg  prn.  Normal arthritis panel.

## 2017-02-24 NOTE — Patient Instructions (Addendum)
Per patient's request will hold rheumatology referral at this time due to symptoms improvement with lyrica and meloxicam  I encourage regular exercise to promote joint motility and weight loss.  You will be contacted with pap results.  Health Maintenance, Female Adopting a healthy lifestyle and getting preventive care can go a long way to promote health and wellness. Talk with your health care provider about what schedule of regular examinations is right for you. This is a good chance for you to check in with your provider about disease prevention and staying healthy. In between checkups, there are plenty of things you can do on your own. Experts have done a lot of research about which lifestyle changes and preventive measures are most likely to keep you healthy. Ask your health care provider for more information. Weight and diet Eat a healthy diet  Be sure to include plenty of vegetables, fruits, low-fat dairy products, and lean protein.  Do not eat a lot of foods high in solid fats, added sugars, or salt.  Get regular exercise. This is one of the most important things you can do for your health. ? Most adults should exercise for at least 150 minutes each week. The exercise should increase your heart rate and make you sweat (moderate-intensity exercise). ? Most adults should also do strengthening exercises at least twice a week. This is in addition to the moderate-intensity exercise.  Maintain a healthy weight  Body mass index (BMI) is a measurement that can be used to identify possible weight problems. It estimates body fat based on height and weight. Your health care provider can help determine your BMI and help you achieve or maintain a healthy weight.  For females 33 years of age and older: ? A BMI below 18.5 is considered underweight. ? A BMI of 18.5 to 24.9 is normal. ? A BMI of 25 to 29.9 is considered overweight. ? A BMI of 30 and above is considered obese.  Watch levels of  cholesterol and blood lipids  You should start having your blood tested for lipids and cholesterol at 50 years of age, then have this test every 5 years.  You may need to have your cholesterol levels checked more often if: ? Your lipid or cholesterol levels are high. ? You are older than 50 years of age. ? You are at high risk for heart disease.  Cancer screening Lung Cancer  Lung cancer screening is recommended for adults 61-38 years old who are at high risk for lung cancer because of a history of smoking.  A yearly low-dose CT scan of the lungs is recommended for people who: ? Currently smoke. ? Have quit within the past 15 years. ? Have at least a 30-pack-year history of smoking. A pack year is smoking an average of one pack of cigarettes a day for 1 year.  Yearly screening should continue until it has been 15 years since you quit.  Yearly screening should stop if you develop a health problem that would prevent you from having lung cancer treatment.  Breast Cancer  Practice breast self-awareness. This means understanding how your breasts normally appear and feel.  It also means doing regular breast self-exams. Let your health care provider know about any changes, no matter how small.  If you are in your 20s or 30s, you should have a clinical breast exam (CBE) by a health care provider every 1-3 years as part of a regular health exam.  If you are 40 or older, have  a CBE every year. Also consider having a breast X-ray (mammogram) every year.  If you have a family history of breast cancer, talk to your health care provider about genetic screening.  If you are at high risk for breast cancer, talk to your health care provider about having an MRI and a mammogram every year.  Breast cancer gene (BRCA) assessment is recommended for women who have family members with BRCA-related cancers. BRCA-related cancers include: ? Breast. ? Ovarian. ? Tubal. ? Peritoneal cancers.  Results  of the assessment will determine the need for genetic counseling and BRCA1 and BRCA2 testing.  Cervical Cancer Your health care provider may recommend that you be screened regularly for cancer of the pelvic organs (ovaries, uterus, and vagina). This screening involves a pelvic examination, including checking for microscopic changes to the surface of your cervix (Pap test). You may be encouraged to have this screening done every 3 years, beginning at age 62.  For women ages 52-65, health care providers may recommend pelvic exams and Pap testing every 3 years, or they may recommend the Pap and pelvic exam, combined with testing for human papilloma virus (HPV), every 5 years. Some types of HPV increase your risk of cervical cancer. Testing for HPV may also be done on women of any age with unclear Pap test results.  Other health care providers may not recommend any screening for nonpregnant women who are considered low risk for pelvic cancer and who do not have symptoms. Ask your health care provider if a screening pelvic exam is right for you.  If you have had past treatment for cervical cancer or a condition that could lead to cancer, you need Pap tests and screening for cancer for at least 20 years after your treatment. If Pap tests have been discontinued, your risk factors (such as having a new sexual partner) need to be reassessed to determine if screening should resume. Some women have medical problems that increase the chance of getting cervical cancer. In these cases, your health care provider may recommend more frequent screening and Pap tests.  Colorectal Cancer  This type of cancer can be detected and often prevented.  Routine colorectal cancer screening usually begins at 50 years of age and continues through 50 years of age.  Your health care provider may recommend screening at an earlier age if you have risk factors for colon cancer.  Your health care provider may also recommend using  home test kits to check for hidden blood in the stool.  A small camera at the end of a tube can be used to examine your colon directly (sigmoidoscopy or colonoscopy). This is done to check for the earliest forms of colorectal cancer.  Routine screening usually begins at age 52.  Direct examination of the colon should be repeated every 5-10 years through 50 years of age. However, you may need to be screened more often if early forms of precancerous polyps or small growths are found.  Skin Cancer  Check your skin from head to toe regularly.  Tell your health care provider about any new moles or changes in moles, especially if there is a change in a mole's shape or color.  Also tell your health care provider if you have a mole that is larger than the size of a pencil eraser.  Always use sunscreen. Apply sunscreen liberally and repeatedly throughout the day.  Protect yourself by wearing long sleeves, pants, a wide-brimmed hat, and sunglasses whenever you are outside.  Heart  disease, diabetes, and high blood pressure  High blood pressure causes heart disease and increases the risk of stroke. High blood pressure is more likely to develop in: ? People who have blood pressure in the high end of the normal range (130-139/85-89 mm Hg). ? People who are overweight or obese. ? People who are African American.  If you are 15-26 years of age, have your blood pressure checked every 3-5 years. If you are 6 years of age or older, have your blood pressure checked every year. You should have your blood pressure measured twice-once when you are at a hospital or clinic, and once when you are not at a hospital or clinic. Record the average of the two measurements. To check your blood pressure when you are not at a hospital or clinic, you can use: ? An automated blood pressure machine at a pharmacy. ? A home blood pressure monitor.  If you are between 70 years and 5 years old, ask your health care provider  if you should take aspirin to prevent strokes.  Have regular diabetes screenings. This involves taking a blood sample to check your fasting blood sugar level. ? If you are at a normal weight and have a low risk for diabetes, have this test once every three years after 50 years of age. ? If you are overweight and have a high risk for diabetes, consider being tested at a younger age or more often. Preventing infection Hepatitis B  If you have a higher risk for hepatitis B, you should be screened for this virus. You are considered at high risk for hepatitis B if: ? You were born in a country where hepatitis B is common. Ask your health care provider which countries are considered high risk. ? Your parents were born in a high-risk country, and you have not been immunized against hepatitis B (hepatitis B vaccine). ? You have HIV or AIDS. ? You use needles to inject street drugs. ? You live with someone who has hepatitis B. ? You have had sex with someone who has hepatitis B. ? You get hemodialysis treatment. ? You take certain medicines for conditions, including cancer, organ transplantation, and autoimmune conditions.  Hepatitis C  Blood testing is recommended for: ? Everyone born from [birth year] through 1964. ? Anyone with known risk factors for hepatitis C.  Sexually transmitted infections (STIs)  You should be screened for sexually transmitted infections (STIs) including gonorrhea and chlamydia if: ? You are sexually active and are younger than 50 years of age. ? You are older than 50 years of age and your health care provider tells you that you are at risk for this type of infection. ? Your sexual activity has changed since you were last screened and you are at an increased risk for chlamydia or gonorrhea. Ask your health care provider if you are at risk.  If you do not have HIV, but are at risk, it may be recommended that you take a prescription medicine daily to prevent HIV infection. This  is called pre-exposure prophylaxis (PrEP). You are considered at risk if: ? You are sexually active and do not regularly use condoms or know the HIV status of your partner(s). ? You take drugs by injection. ? You are sexually active with a partner who has HIV.  Talk with your health care provider about whether you are at high risk of being infected with HIV. If you choose to begin PrEP, you should first be tested for HIV. You  should then be tested every 3 months for as long as you are taking PrEP. Pregnancy  If you are premenopausal and you may become pregnant, ask your health care provider about preconception counseling.  If you may become pregnant, take 400 to 800 micrograms (mcg) of folic acid every day.  If you want to prevent pregnancy, talk to your health care provider about birth control (contraception). Osteoporosis and menopause  Osteoporosis is a disease in which the bones lose minerals and strength with aging. This can result in serious bone fractures. Your risk for osteoporosis can be identified using a bone density scan.  If you are 35 years of age or older, or if you are at risk for osteoporosis and fractures, ask your health care provider if you should be screened.  Ask your health care provider whether you should take a calcium or vitamin D supplement to lower your risk for osteoporosis.  Menopause may have certain physical symptoms and risks.  Hormone replacement therapy may reduce some of these symptoms and risks. Talk to your health care provider about whether hormone replacement therapy is right for you. Follow these instructions at home:  Schedule regular health, dental, and eye exams.  Stay current with your immunizations.  Do not use any tobacco products including cigarettes, chewing tobacco, or electronic cigarettes.  If you are pregnant, do not drink alcohol.  If you are breastfeeding, limit how much and how often you drink alcohol.  Limit alcohol intake  to no more than 1 drink per day for nonpregnant women. One drink equals 12 ounces of beer, 5 ounces of wine, or 1 ounces of hard liquor.  Do not use street drugs.  Do not share needles.  Ask your health care provider for help if you need support or information about quitting drugs.  Tell your health care provider if you often feel depressed.  Tell your health care provider if you have ever been abused or do not feel safe at home. This information is not intended to replace advice given to you by your health care provider. Make sure you discuss any questions you have with your health care provider. Document Released: 02/11/2011 Document Revised: 01/04/2016 Document Reviewed: 05/02/2015 Elsevier Interactive Patient Education  [employer].

## 2017-02-27 LAB — CYTOLOGY - PAP
Adequacy: ABSENT
Bacterial vaginitis: NEGATIVE
Candida vaginitis: NEGATIVE
Diagnosis: NEGATIVE
HPV: DETECTED — AB

## 2017-02-28 ENCOUNTER — Ambulatory Visit: Payer: Managed Care, Other (non HMO) | Admitting: Nurse Practitioner

## 2017-03-03 ENCOUNTER — Encounter: Payer: Self-pay | Admitting: Nurse Practitioner

## 2017-03-06 ENCOUNTER — Encounter: Payer: Self-pay | Admitting: Nurse Practitioner

## 2017-05-27 ENCOUNTER — Encounter: Payer: Self-pay | Admitting: Nurse Practitioner

## 2017-05-27 ENCOUNTER — Other Ambulatory Visit: Payer: Self-pay | Admitting: Nurse Practitioner

## 2017-05-27 ENCOUNTER — Telehealth: Payer: Self-pay | Admitting: Nurse Practitioner

## 2017-05-27 DIAGNOSIS — R5383 Other fatigue: Secondary | ICD-10-CM

## 2017-05-27 DIAGNOSIS — M2550 Pain in unspecified joint: Secondary | ICD-10-CM

## 2017-05-27 DIAGNOSIS — M2560 Stiffness of unspecified joint, not elsewhere classified: Secondary | ICD-10-CM

## 2017-05-27 DIAGNOSIS — G43009 Migraine without aura, not intractable, without status migrainosus: Secondary | ICD-10-CM

## 2017-05-27 MED ORDER — PREGABALIN 75 MG PO CAPS: 75.0000 mg | ORAL_CAPSULE | Freq: Two times a day (BID) | ORAL | 2 refills | Status: DC

## 2017-05-27 MED ORDER — BUTALBITAL-APAP-CAFFEINE 50-300-40 MG PO CAPS: 1.0000 | ORAL_CAPSULE | Freq: Four times a day (QID) | ORAL | 0 refills | Status: DC | PRN

## 2017-05-27 NOTE — Telephone Encounter (Signed)
Requesting refill on lyrica

## 2017-05-27 NOTE — Telephone Encounter (Signed)
Rx for Fioricet and lyrica will be fax tomorrow to [facility]. Omeprazole last sent in was 02/2017 for 6 mo supply. Pt should have enough until she comes in for her appt in 08/2017.   Will call pt tomorrow.

## 2017-05-28 NOTE — Telephone Encounter (Signed)
rx faxed to [facility], pt is aware about all 3 meds.

## 2017-06-17 ENCOUNTER — Encounter: Payer: Self-pay | Admitting: Nurse Practitioner

## 2017-06-17 ENCOUNTER — Other Ambulatory Visit: Payer: Self-pay | Admitting: Nurse Practitioner

## 2017-06-17 DIAGNOSIS — E039 Hypothyroidism, unspecified: Secondary | ICD-10-CM

## 2017-06-17 NOTE — Telephone Encounter (Signed)
Please advise, last lab was 01/2017 suppose to repeat this again in 04/2017 but pt advise to come back for a follow up in 08/2017. Please advise. Ok to send in? With refills?

## 2017-07-22 ENCOUNTER — Ambulatory Visit: Payer: Managed Care, Other (non HMO) | Admitting: Internal Medicine

## 2017-07-22 ENCOUNTER — Encounter: Payer: Self-pay | Admitting: Internal Medicine

## 2017-07-22 VITALS — BP 116/78 | HR 80 | Temp 97.3°F | Resp 16 | Ht 62.0 in | Wt 172.0 lb

## 2017-07-22 DIAGNOSIS — E538 Deficiency of other specified B group vitamins: Secondary | ICD-10-CM

## 2017-07-22 DIAGNOSIS — Z780 Asymptomatic menopausal state: Secondary | ICD-10-CM

## 2017-07-22 DIAGNOSIS — I10 Essential (primary) hypertension: Secondary | ICD-10-CM

## 2017-07-22 DIAGNOSIS — R030 Elevated blood-pressure reading, without diagnosis of hypertension: Secondary | ICD-10-CM

## 2017-07-22 DIAGNOSIS — E1140 Type 2 diabetes mellitus with diabetic neuropathy, unspecified: Secondary | ICD-10-CM

## 2017-07-22 DIAGNOSIS — Z1211 Encounter for screening for malignant neoplasm of colon: Secondary | ICD-10-CM

## 2017-07-22 DIAGNOSIS — Z0000 Encounter for general adult medical examination without abnormal findings: Secondary | ICD-10-CM

## 2017-07-22 DIAGNOSIS — K219 Gastro-esophageal reflux disease without esophagitis: Secondary | ICD-10-CM

## 2017-07-22 DIAGNOSIS — Z23 Encounter for immunization: Secondary | ICD-10-CM

## 2017-07-22 DIAGNOSIS — Z79899 Other long term (current) drug therapy: Secondary | ICD-10-CM | POA: Diagnosis not present

## 2017-07-22 DIAGNOSIS — Z136 Encounter for screening for cardiovascular disorders: Secondary | ICD-10-CM

## 2017-07-22 DIAGNOSIS — Z0001 Encounter for general adult medical examination with abnormal findings: Secondary | ICD-10-CM

## 2017-07-22 DIAGNOSIS — N951 Menopausal and female climacteric states: Secondary | ICD-10-CM

## 2017-07-22 DIAGNOSIS — E782 Mixed hyperlipidemia: Secondary | ICD-10-CM

## 2017-07-22 DIAGNOSIS — Z9109 Other allergy status, other than to drugs and biological substances: Secondary | ICD-10-CM

## 2017-07-22 DIAGNOSIS — Z1212 Encounter for screening for malignant neoplasm of rectum: Secondary | ICD-10-CM

## 2017-07-22 DIAGNOSIS — G609 Hereditary and idiopathic neuropathy, unspecified: Secondary | ICD-10-CM

## 2017-07-22 DIAGNOSIS — F17200 Nicotine dependence, unspecified, uncomplicated: Secondary | ICD-10-CM

## 2017-07-22 DIAGNOSIS — R5383 Other fatigue: Secondary | ICD-10-CM

## 2017-07-22 DIAGNOSIS — E559 Vitamin D deficiency, unspecified: Secondary | ICD-10-CM

## 2017-07-22 DIAGNOSIS — E039 Hypothyroidism, unspecified: Secondary | ICD-10-CM

## 2017-07-22 MED ORDER — LEVOTHYROXINE SODIUM 125 MCG PO TABS: ORAL_TABLET | ORAL | 1 refills | Status: DC

## 2017-07-22 MED ORDER — METFORMIN HCL ER 500 MG PO TB24: ORAL_TABLET | ORAL | 1 refills | Status: DC

## 2017-07-22 MED ORDER — PREGABALIN 75 MG PO CAPS: ORAL_CAPSULE | ORAL | 1 refills | Status: DC

## 2017-07-22 MED ORDER — CONJ ESTROG-MEDROXYPROGEST ACE 0.45-1.5 MG PO TABS: ORAL_TABLET | ORAL | 1 refills | Status: DC

## 2017-07-22 MED ORDER — MONTELUKAST SODIUM 10 MG PO TABS: ORAL_TABLET | ORAL | 3 refills | Status: DC

## 2017-07-22 MED ORDER — OMEPRAZOLE 40 MG PO CPDR: DELAYED_RELEASE_CAPSULE | ORAL | 1 refills | Status: DC

## 2017-07-22 NOTE — Progress Notes (Signed)
[facility] Lucky CowboyWilliam Cornell Bourbon, M.D.     Dyanne CarrelAmanda R. Steffanie Dunnollier, P.A.-C Judd GaudierAshley Corbett, DNP [facility] [address] [place], [place] [id number] Telephone [phone] Telefax [phone] Annual Screening/Preventative Visit & Comprehensive Evaluation &  Examination     This very nice 51 y.o. MWF presents as a new patient to establish with a Screening/Preventative Visit & comprehensive evaluation and management of multiple medical co-morbidities.  Patient his evaluated expectantly for elevated BP, T2_NIDDM , Hyperlipidemia and Vitamin D Deficiency. She also has GERD failing attempt to wean from PPI's recently. She has 20 year smoking hx approx 1/2 ppd and has no intentions to taper or quit smoking.       Patient is monitored expectantly fore elevated BP.Patient's BP has been controlled at home and patient denies any cardiac symptoms as chest pain, palpitations, shortness of breath, dizziness or ankle swelling. Today's BP is at goal -  116/78.      Patient is screened expectantly for abnormal  Lipids.     Patient has hx/o Gestational Diabetes back 6323 and 25 years ago (1993 & 1995) but wasn't dx'd  T2_DM until 2016 when she was started on Metformin. and patient denies reactive hypoglycemic symptoms, visual blurring, diabetic polys, but he does have hx/o painful  Paresthesias of the lower extremities. She does not monitor CBG's.  Her most recent  A1c was not at goal: Lab Results  Component Value Date   HGBA1C 7.1 (H) 01/16/2017      Finally, patient has history of Vitamin D Deficiency ("8" in June 2018) and is not on supplements.   Current Outpatient Medications on File Prior to Visit  Medication Sig  . Butalbital-APAP-Caffeine (FIORICET) 50-300-40 MG CAPS Take 1 tablet by mouth 4 (four) times daily as needed. migrains  . cetirizine (ZYRTEC) 10 MG tablet Take 10 mg by mouth daily.  . cyanocobalamin (,VITAMIN B-12,) 1000 MCG/ML  injection Inject 1,000 mcg into the muscle.  . estrogen, conjugated,-medroxyprogesterone (PREMPRO) 0.45-1.5 MG tablet Take 1 tablet by mouth daily.  . fluticasone (FLONASE) 50 MCG/ACT nasal spray Place into both nostrils daily.  Marland Kitchen. gabapentin (NEURONTIN) 300 MG capsule Take 300 mg by mouth. Takes 1 capsule daily if needed.  Marland Kitchen. levothyroxine (SYNTHROID, LEVOTHROID) 125 MCG tablet Take 125 mcg by mouth daily before breakfast.  . meloxicam (MOBIC) 7.5 MG tablet Take 1 tablet (7.5 mg total) by mouth daily as needed for pain.  . metFORMIN (GLUCOPHAGE) 500 MG tablet Take by mouth 2 (two) times daily with a meal.  . nitrofurantoin (MACRODANTIN) 100 MG capsule Take 100 mg by mouth 4 (four) times daily. 3 times a week PRN  . omeprazole (PRILOSEC) 40 MG capsule Take 1 capsule (40 mg total) by mouth daily.  . pregabalin (LYRICA) 75 MG capsule Take 1 capsule (75 mg total) by mouth 2 (two) times daily.   No current facility-administered medications on file prior to visit.    Allergies  Allergen Reactions  . Tetanus Toxoids Other (See Comments)    Injection site abcess  . Sulfa Antibiotics Hives    itching   Past Medical History:  Diagnosis Date  . Diabetes mellitus without complication (HCC)   . Diverticulitis   . GERD (gastroesophageal reflux disease)   . Hyperlipidemia   . IBS (irritable bowel syndrome)   . Migraine   . Thyroid disease   . UTI (urinary tract infection)    Health Maintenance  Topic Date Due  . PNEUMOCOCCAL POLYSACCHARIDE VACCINE (1)  03/22/1969  . FOOT EXAM  03/22/1977  . OPHTHALMOLOGY EXAM  03/22/1977  . URINE MICROALBUMIN  03/22/1977  . HIV Screening  03/22/1982  . INFLUENZA VACCINE  03/12/2017  . MAMMOGRAM  03/22/2017  . COLONOSCOPY  03/22/2017  . HEMOGLOBIN A1C  07/18/2017  . TETANUS/TDAP  01/16/2018 (Originally 03/22/1986)  . PAP SMEAR  02/25/2020   Immunization History  Administered Date(s) Administered  . Influenza Inj Mdck Quad With Preservative 07/22/2017    Past Surgical History:  Procedure Laterality Date  . CESAREAN SECTION  1993  . cystoscopies     multiple  . FUNCTIONAL ENDOSCOPIC SINUS SURGERY  2003  . LAPAROSCOPIC OVARIAN CYSTECTOMY  1983  . TONSILLECTOMY  1973  . TYMPANOPLASTY W/ MASTOIDECTOMY Left 2016 & 2017   Family History  Problem Relation Age of Onset  . Mental illness Mother        depression  . Alcohol abuse Father   . Kidney disease Father   . Mental illness Father        depression  . Cancer Maternal Aunt        breast cancer  . Heart disease Maternal Grandmother    Social History   Tobacco Use  . Smoking status: Current Every Day Smoker    Types: Cigarettes  . Smokeless tobacco: Never Used  Substance Use Topics  . Alcohol use: No  . Drug use: No    ROS Constitutional: Denies fever, chills, weight loss/gain, headaches, insomnia,  night sweats, and change in appetite. Does c/o fatigue. Eyes: Denies redness, blurred vision, diplopia, discharge, itchy, watery eyes.  ENT: Denies discharge, congestion, post nasal drip, epistaxis, sore throat, earache, hearing loss, dental pain, Tinnitus, Vertigo, Sinus pain, snoring.  Cardio: Denies chest pain, palpitations, irregular heartbeat, syncope, dyspnea, diaphoresis, orthopnea, PND, claudication, edema Respiratory: denies cough, dyspnea, DOE, pleurisy, hoarseness, laryngitis, wheezing.  Gastrointestinal: Denies dysphagia, heartburn, reflux, water brash, pain, cramps, nausea, vomiting, bloating, diarrhea, constipation, hematemesis, melena, hematochezia, jaundice, hemorrhoids Genitourinary: Denies dysuria, frequency, urgency, nocturia, hesitancy, discharge, hematuria, flank pain Breast: Breast lumps, nipple discharge, bleeding.  Musculoskeletal: Denies arthralgia, myalgia, stiffness, Jt. Swelling, pain, limp, and strain/sprain. Denies falls. Skin: Denies puritis, rash, hives, warts, acne, eczema, changing in skin lesion Neuro: No weakness, tremor, incoordination, spasms,  paresthesia, pain Psychiatric: Denies confusion, memory loss, sensory loss. Denies Depression. Endocrine: Denies change in weight, skin, hair change, nocturia, and paresthesia, diabetic polys, visual blurring, hyper / hypo glycemic episodes.  Heme/Lymph: No excessive bleeding, bruising, enlarged lymph nodes.  Physical Exam  BP 116/78   Pulse 80   Temp (!) 97.3 F (36.3 C)   Resp 16   Ht 5\' 2"  (1.575 m)   Wt 172 lb (78 kg)   BMI 31.46 kg/m   General Appearance: over nourished, well groomed and in no apparent distress.  Eyes: PERRLA, EOMs, conjunctiva no swelling or erythema, normal fundi and vessels. Sinuses: No frontal/maxillary tenderness ENT/Mouth: EACs patent / TMs  nl. Nares clear without erythema, swelling, mucoid exudates. Oral hygiene is good. No erythema, swelling, or exudate. Tongue normal, non-obstructing. Tonsils not swollen or erythematous. Hearing normal.  Neck: Supple, thyroid normal. No bruits, nodes or JVD. Respiratory: Respiratory effort normal.  BS equal and clear bilateral without rales, rhonci, wheezing or stridor. Cardio: Heart sounds are normal with regular rate and rhythm and no murmurs, rubs or gallops. Peripheral pulses are normal and equal bilaterally without edema. No aortic or femoral bruits. Chest: symmetric with normal excursions and percussion. Breasts: Symmetric, without lumps, nipple discharge,  retractions, or fibrocystic changes.  Abdomen: Flat, soft with bowel sounds active. Nontender, no guarding, rebound, hernias, masses, or organomegaly.  Lymphatics: Non tender without lymphadenopathy.  Musculoskeletal: Full ROM all peripheral extremities, joint stability, 5/5 strength, and normal gait. Skin: Warm and dry without rashes, lesions, cyanosis, clubbing or  ecchymosis.  Neuro: Cranial nerves intact, reflexes equal bilaterally. Normal muscle tone, no cerebellar symptoms. Sensation intact to touch, vibratory and Monofilament to the toes  bilaterally.  Pysch: Alert and oriented X 3, normal affect, Insight and Judgment appropriate.   Assessment and Plan  1. Annual Preventative Screening Examination  1. Encounter for general adult medical examination with abnormal findings   2. Elevated BP without diagnosis of hypertension  - EKG 12-Lead - [place], RETROPERITNL ABD,  LTD - Urinalysis, Routine w reflex microscopic - Microalbumin / creatinine urine ratio - CBC with Differential/Platelet - BASIC METABOLIC PANEL WITH GFR - Magnesium - TSH  3. Hyperlipidemia, mixed  - EKG 12-Lead - [place], RETROPERITNL ABD,  LTD - Hepatic function panel - Lipid panel - TSH  4. Type 2 diabetes mellitus with diabetic neuropathy, without long-term current use of insulin (HCC)  - metFORMIN (GLUCOPHAGE XR) 500 MG 24 hr tablet; Take 2 tablets 2 x / day for Diabetes  Dispense: 360 tablet; Refill: 1 - EKG 12-Lead - [place], RETROPERITNL ABD,  LTD - Urinalysis, Routine w reflex microscopic - Microalbumin / creatinine urine ratio - Hemoglobin A1c - Insulin, random  5. Vitamin D deficiency  - VITAMIN D 25 Hydroxy (Vit-D Deficiency, Fractures)  6. Vitamin B12 deficiency  - Vitamin B12  7. Environmental allergies  - montelukast (SINGULAIR) 10 MG tablet; Take 1 tablet daily for Allergies  Dispense: 90 tablet; Refill: 3  8. Smoker  - EKG 12-Lead - [place], RETROPERITNL ABD,  LTD - DG Chest 2 View; Future  9. Screening for colorectal cancer  - POC Hemoccult Bld/Stl (3-Cd Home Screen); Future  10. Screening for ischemic heart disease  - EKG 12-Lead  11. Screening for AAA (aortic abdominal aneurysm)  - [place], RETROPERITNL ABD,  LTD  12. Need for immunization against influenza  - FLU VACCINE MDCK QUAD W/Preservative  13. Fatigue, unspecified type  - Iron,Total/Total Iron Binding Cap - Vitamin B12 - CBC with Differential/Platelet - TSH - pregabalin (LYRICA) 75 MG capsule; Take 1 capsule 3 x / day for neuropathy pains  Dispense: 270  capsule; Refill: 1  14. Medication management  - Urinalysis, Routine w reflex microscopic - Microalbumin / creatinine urine ratio - Vitamin B12 - CBC with Differential/Platelet - BASIC METABOLIC PANEL WITH GFR - Hepatic function panel - Magnesium - Lipid panel - TSH - Hemoglobin A1c - Insulin, random - VITAMIN D 25 Hydroxy (Vit-D Deficiency, Fractures)  15. Postmenopausal  - estrogen, conjugated,-medroxyprogesterone (PREMPRO) 0.45-1.5 MG tablet; Take 1 tablet daily  Dispense: 90 tablet; Refill: 1  16. Hot flashes due to menopause ** - estrogen, conjugated,-medroxyprogesterone (PREMPRO) 0.45-1.5 MG tablet; Take 1 tablet daily  Dispense: 90 tablet; Refill: 1  17. Gastroesophageal reflux disease without esophagitis  - omeprazole (PRILOSEC) 40 MG capsule; Take 1 capsule daily for acid indigestion  Dispense: 90 capsule; Refill: 1  19. Neuropathy  - pregabalin (LYRICA) 75 MG capsule; Take 1 capsule 3 x / day for neuropathy pains  Dispense: 270 capsule; Refill: 1       Patient was counseled in prudent diet to achieve/maintain BMI less than 25 for weight control, BP monitoring, regular exercise and medications. Discussed med's effects and SE's. Screening labs  and tests as requested with regular follow-up as recommended. Over 40 minutes of exam, counseling, chart review and high complex critical decision making was performed.

## 2017-07-22 NOTE — Patient Instructions (Addendum)
(1) Recommend take Low Dose Aspirin 81 mg enteric coated Daily  (2) Recommend take Vit D 5,000 units x 2 capsules  = 10,000 units / daily   ++++++++++++++++++++++++++++++   Preventive Care for Adults  A healthy lifestyle and preventive care can promote health and wellness. Preventive health guidelines for women include the following key practices.  A routine yearly physical is a good way to check with your health care provider about your health and preventive screening. It is a chance to share any concerns and updates on your health and to receive a thorough exam.  Visit your dentist for a routine exam and preventive care every 6 months. Brush your teeth twice a day and floss once a day. Good oral hygiene prevents tooth decay and gum disease.  The frequency of eye exams is based on your age, health, family medical history, use of contact lenses, and other factors. Follow your health care provider's recommendations for frequency of eye exams.  Eat a healthy diet. Foods like vegetables, fruits, whole grains, low-fat dairy products, and lean protein foods contain the nutrients you need without too many calories. Decrease your intake of foods high in solid fats, added sugars, and salt. Eat the right amount of calories for you.Get information about a proper diet from your health care provider, if necessary.  Regular physical exercise is one of the most important things you can do for your health. Most adults should get at least 150 minutes of moderate-intensity exercise (any activity that increases your heart rate and causes you to sweat) each week. In addition, most adults need muscle-strengthening exercises on 2 or more days a week.  Maintain a healthy weight. The body mass index (BMI) is a screening tool to identify possible weight problems. It provides an estimate of body fat based on height and weight. Your health care provider can find your BMI and can help you achieve or maintain a healthy  weight.For adults 20 years and older:  A BMI below 18.5 is considered underweight.  A BMI of 18.5 to 24.9 is normal.  A BMI of 25 to 29.9 is considered overweight.  A BMI of 30 and above is considered obese.  Maintain normal blood lipids and cholesterol levels by exercising and minimizing your intake of saturated fat. Eat a balanced diet with plenty of fruit and vegetables. Blood tests for lipids and cholesterol should begin at age 34 and be repeated every 5 years. If your lipid or cholesterol levels are high, you are over 50, or you are at high risk for heart disease, you may need your cholesterol levels checked more frequently.Ongoing high lipid and cholesterol levels should be treated with medicines if diet and exercise are not working.  If you smoke, find out from your health care provider how to quit. If you do not use tobacco, do not start.  Lung cancer screening is recommended for adults aged 51-80 years who are at high risk for developing lung cancer because of a history of smoking. A yearly low-dose CT scan of the lungs is recommended for people who have at least a 30-pack-year history of smoking and are a current smoker or have quit within the past 15 years. A pack year of smoking is smoking an average of 1 pack of cigarettes a day for 1 year (for example: 1 pack a day for 30 years or 2 packs a day for 15 years). Yearly screening should continue until the smoker has stopped smoking for at least 15 years.  Yearly screening should be stopped for people who develop a health problem that would prevent them from having lung cancer treatment.  High blood pressure causes heart disease and increases the risk of stroke. Your blood pressure should be checked at least every 1 to 2 years. Ongoing high blood pressure should be treated with medicines if weight loss and exercise do not work.  If you are 27-91 years old, ask your health care provider if you should take aspirin to prevent  strokes.  Diabetes screening involves taking a blood sample to check your fasting blood sugar level. This should be done once every 3 years, after age 76, if you are within normal weight and without risk factors for diabetes. Testing should be considered at a younger age or be carried out more frequently if you are overweight and have at least 1 risk factor for diabetes.  Breast cancer screening is essential preventive care for women. You should practice "breast self-awareness." This means understanding the normal appearance and feel of your breasts and may include breast self-examination. Any changes detected, no matter how small, should be reported to a health care provider. Women in their 40s and 30s should have a clinical breast exam (CBE) by a health care provider as part of a regular health exam every 1 to 3 years. After age 48, women should have a CBE every year. Starting at age 14, women should consider having a mammogram (breast X-ray test) every year. Women who have a family history of breast cancer should talk to their health care provider about genetic screening. Women at a high risk of breast cancer should talk to their health care providers about having an MRI and a mammogram every year.  Breast cancer gene (BRCA)-related cancer risk assessment is recommended for women who have family members with BRCA-related cancers. BRCA-related cancers include breast, ovarian, tubal, and peritoneal cancers. Having family members with these cancers may be associated with an increased risk for harmful changes (mutations) in the breast cancer genes BRCA1 and BRCA2. Results of the assessment will determine the need for genetic counseling and BRCA1 and BRCA2 testing.  Routine pelvic exams to screen for cancer are no longer recommended for nonpregnant women who are considered low risk for cancer of the pelvic organs (ovaries, uterus, and vagina) and who do not have symptoms. Ask your health care provider if a  screening pelvic exam is right for you.  If you have had past treatment for cervical cancer or a condition that could lead to cancer, you need Pap tests and screening for cancer for at least 20 years after your treatment. If Pap tests have been discontinued, your risk factors (such as having a new sexual partner) need to be reassessed to determine if screening should be resumed. Some women have medical problems that increase the chance of getting cervical cancer. In these cases, your health care provider may recommend more frequent screening and Pap tests.  Colorectal cancer can be detected and often prevented. Most routine colorectal cancer screening begins at the age of 68 years and continues through age 32 years. However, your health care provider may recommend screening at an earlier age if you have risk factors for colon cancer. On a yearly basis, your health care provider may provide home test kits to check for hidden blood in the stool. Use of a small camera at the end of a tube, to directly examine the colon (sigmoidoscopy or colonoscopy), can detect the earliest forms of colorectal cancer. Talk to  your health care provider about this at age 82, when routine screening begins. Direct exam of the colon should be repeated every 5-10 years through age 15 years, unless early forms of pre-cancerous polyps or small growths are found.  Hepatitis C blood testing is recommended for all people born from [birth year] through 1965 and any individual with known risks for hepatitis C.  Pra  Osteoporosis is a disease in which the bones lose minerals and strength with aging. This can result in serious bone fractures or breaks. The risk of osteoporosis can be identified using a bone density scan. Women ages 70 years and over and women at risk for fractures or osteoporosis should discuss screening with their health care providers. Ask your health care provider whether you should take a calcium supplement or vitamin D to  reduce the rate of osteoporosis.  Menopause can be associated with physical symptoms and risks. Hormone replacement therapy is available to decrease symptoms and risks. You should talk to your health care provider about whether hormone replacement therapy is right for you.  Use sunscreen. Apply sunscreen liberally and repeatedly throughout the day. You should seek shade when your shadow is shorter than you. Protect yourself by wearing long sleeves, pants, a wide-brimmed hat, and sunglasses year round, whenever you are outdoors.  Once a month, do a whole body skin exam, using a mirror to look at the skin on your back. Tell your health care provider of new moles, moles that have irregular borders, moles that are larger than a pencil eraser, or moles that have changed in shape or color.  Stay current with required vaccines (immunizations).  Influenza vaccine. All adults should be immunized every year.  Tetanus, diphtheria, and acellular pertussis (Td, Tdap) vaccine. Pregnant women should receive 1 dose of Tdap vaccine during each pregnancy. The dose should be obtained regardless of the length of time since the last dose. Immunization is preferred during the 27th-36th week of gestation. An adult who has not previously received Tdap or who does not know her vaccine status should receive 1 dose of Tdap. This initial dose should be followed by tetanus and diphtheria toxoids (Td) booster doses every 10 years. Adults with an unknown or incomplete history of completing a 3-dose immunization series with Td-containing vaccines should begin or complete a primary immunization series including a Tdap dose. Adults should receive a Td booster every 10 years.  Varicella vaccine. An adult without evidence of immunity to varicella should receive 2 doses or a second dose if she has previously received 1 dose. Pregnant females who do not have evidence of immunity should receive the first dose after pregnancy. This first  dose should be obtained before leaving the health care facility. The second dose should be obtained 4-8 weeks after the first dose.  Human papillomavirus (HPV) vaccine. Females aged 13-26 years who have not received the vaccine previously should obtain the 3-dose series. The vaccine is not recommended for use in pregnant females. However, pregnancy testing is not needed before receiving a dose. If a female is found to be pregnant after receiving a dose, no treatment is needed. In that case, the remaining doses should be delayed until after the pregnancy. Immunization is recommended for any person with an immunocompromised condition through the age of 38 years if she did not get any or all doses earlier. During the 3-dose series, the second dose should be obtained 4-8 weeks after the first dose. The third dose should be obtained 24 weeks after the  first dose and 16 weeks after the second dose.  Zoster vaccine. One dose is recommended for adults aged 58 years or older unless certain conditions are present.  Measles, mumps, and rubella (MMR) vaccine. Adults born before [birth year] generally are considered immune to measles and mumps. Adults born in [birth year] or later should have 1 or more doses of MMR vaccine unless there is a contraindication to the vaccine or there is laboratory evidence of immunity to each of the three diseases. A routine second dose of MMR vaccine should be obtained at least 28 days after the first dose for students attending postsecondary schools, health care workers, or international travelers. People who received inactivated measles vaccine or an unknown type of measles vaccine during 1963-1967 should receive 2 doses of MMR vaccine. People who received inactivated mumps vaccine or an unknown type of mumps vaccine before 1979 and are at high risk for mumps infection should consider immunization with 2 doses of MMR vaccine. For females of childbearing age, rubella immunity should be determined. If there  is no evidence of immunity, females who are not pregnant should be vaccinated. If there is no evidence of immunity, females who are pregnant should delay immunization until after pregnancy. Unvaccinated health care workers born before [birth year] who lack laboratory evidence of measles, mumps, or rubella immunity or laboratory confirmation of disease should consider measles and mumps immunization with 2 doses of MMR vaccine or rubella immunization with 1 dose of MMR vaccine.  Pneumococcal 13-valent conjugate (PCV13) vaccine. When indicated, a person who is uncertain of her immunization history and has no record of immunization should receive the PCV13 vaccine. An adult aged 75 years or older who has certain medical conditions and has not been previously immunized should receive 1 dose of PCV13 vaccine. This PCV13 should be followed with a dose of pneumococcal polysaccharide (PPSV23) vaccine. The PPSV23 vaccine dose should be obtained at least 8 weeks after the dose of PCV13 vaccine. An adult aged 56 years or older who has certain medical conditions and previously received 1 or more doses of PPSV23 vaccine should receive 1 dose of PCV13. The PCV13 vaccine dose should be obtained 1 or more years after the last PPSV23 vaccine dose.    Pneumococcal polysaccharide (PPSV23) vaccine. When PCV13 is also indicated, PCV13 should be obtained first. All adults aged 56 years and older should be immunized. An adult younger than age 31 years who has certain medical conditions should be immunized. Any person who resides in a nursing home or long-term care facility should be immunized. An adult smoker should be immunized. People with an immunocompromised condition and certain other conditions should receive both PCV13 and PPSV23 vaccines. People with human immunodeficiency virus (HIV) infection should be immunized as soon as possible after diagnosis. Immunization during chemotherapy or radiation therapy should be avoided. Routine use  of PPSV23 vaccine is not recommended for American Indians, [place] Natives, or people younger than 65 years unless there are medical conditions that require PPSV23 vaccine. When indicated, people who have unknown immunization and have no record of immunization should receive PPSV23 vaccine. One-time revaccination 5 years after the first dose of PPSV23 is recommended for people aged 19-64 years who have chronic kidney failure, nephrotic syndrome, asplenia, or immunocompromised conditions. People who received 1-2 doses of PPSV23 before age 28 years should receive another dose of PPSV23 vaccine at age 24 years or later if at least 5 years have passed since the previous dose. Doses of PPSV23 are not needed for  people immunized with PPSV23 at or after age 67 years.  Preventive Services / Frequency   Ages 3 to 7 years  Blood pressure check.  Lipid and cholesterol check.  Lung cancer screening. / Every year if you are aged 76-80 years and have a 30-pack-year history of smoking and currently smoke or have quit within the past 15 years. Yearly screening is stopped once you have quit smoking for at least 15 years or develop a health problem that would prevent you from having lung cancer treatment.  Clinical breast exam.** / Every year after age 11 years.  BRCA-related cancer risk assessment.** / For women who have family members with a BRCA-related cancer (breast, ovarian, tubal, or peritoneal cancers).  Mammogram.** / Every year beginning at age 14 years and continuing for as long as you are in good health. Consult with your health care provider.  Pap test.** / Every 3 years starting at age 54 years through age 100 or 49 years with a history of 3 consecutive normal Pap tests.  HPV screening.** / Every 3 years from ages 70 years through ages 76 to 37 years with a history of 3 consecutive normal Pap tests.  Fecal occult blood test (FOBT) of stool. / Every year beginning at age 60 years and continuing  until age 65 years. You may not need to do this test if you get a colonoscopy every 10 years.  Flexible sigmoidoscopy or colonoscopy.** / Every 5 years for a flexible sigmoidoscopy or every 10 years for a colonoscopy beginning at age 69 years and continuing until age 70 years.  Hepatitis C blood test.** / For all people born from [birth year] through 1964 and any individual with known risks for hepatitis C.  Skin self-exam. / Monthly.  Influenza vaccine. / Every year.  Tetanus, diphtheria, and acellular pertussis (Tdap/Td) vaccine.** / Consult your health care provider. Pregnant women should receive 1 dose of Tdap vaccine during each pregnancy. 1 dose of Td every 10 years.  Varicella vaccine.** / Consult your health care provider. Pregnant females who do not have evidence of immunity should receive the first dose after pregnancy.  Zoster vaccine.** / 1 dose for adults aged 29 years or older.  Pneumococcal 13-valent conjugate (PCV13) vaccine.** / Consult your health care provider.  Pneumococcal polysaccharide (PPSV23) vaccine.** / 1 to 2 doses if you smoke cigarettes or if you have certain conditions.  Meningococcal vaccine.** / Consult your health care provider.  Hepatitis A vaccine.** / Consult your health care provider.  Hepatitis B vaccine.** / Consult your health care provider. Screening for abdominal aortic aneurysm (AAA)  by ultrasound is recommended for people over 50 who have history of high blood pressure or who are current or former smokers. ++++++++++++++++++ Recommend Adult Low Dose Aspirin or  coated  Aspirin 81 mg daily  To reduce risk of Colon Cancer 20 %,  Skin Cancer 26 % ,  Melanoma 46%  and  Pancreatic cancer 60% +++++++++++++++++++ Vitamin D goal  is between 70-100.  Please make sure that you are taking your Vitamin D as directed.  It is very important as a natural anti-inflammatory  helping hair, skin, and nails, as well as reducing stroke and heart attack risk.   It helps your bones and helps with mood. It also decreases numerous cancer risks so please take it as directed.  Low Vit D is associated with a 200-300% higher risk for CANCER  and 200-300% higher risk for HEART   ATTACK  &  STROKE.   .....................................Marland Kitchen  It is also associated with higher death rate at younger ages,  autoimmune diseases like Rheumatoid arthritis, Lupus, Multiple Sclerosis.    Also many other serious conditions, like depression, Alzheimer's Dementia, infertility, muscle aches, fatigue, fibromyalgia - just to name a few. ++++++++++++++++++ Recommend the book "The END of DIETING" by Dr Excell Seltzer  & the book "The END of DIABETES " by Dr Excell Seltzer At [email or web address] - get book & Audio CD's    Being diabetic has a  300% increased risk for heart attack, stroke, cancer, and alzheimer- type vascular dementia. It is very important that you work harder with diet by avoiding all foods that are white. Avoid white rice (brown & wild rice is OK), white potatoes (sweetpotatoes in moderation is OK), White bread or wheat bread or anything made out of white flour like bagels, donuts, rolls, buns, biscuits, cakes, pastries, cookies, pizza crust, and pasta (made from white flour & egg whites) - vegetarian pasta or spinach or wheat pasta is OK. Multigrain breads like Arnold's or Pepper[employer], or multigrain sandwich thins or flatbreads.  Diet, exercise and weight loss can reverse and cure diabetes in the early stages.  Diet, exercise and weight loss is very important in the control and prevention of complications of diabetes which affects every system in your body, ie. Brain - dementia/stroke, eyes - glaucoma/blindness, heart - heart attack/heart failure, kidneys - dialysis, stomach - gastric paralysis, intestines - malabsorption, nerves - severe painful neuritis, circulation - gangrene & loss of a leg(s), and finally cancer and Alzheimers.    I recommend avoid fried & greasy foods,   sweets/candy, white rice (brown or wild rice or Quinoa is OK), white potatoes (sweet potatoes are OK) - anything made from white flour - bagels, doughnuts, rolls, buns, biscuits,white and wheat breads, pizza crust and traditional pasta made of white flour & egg white(vegetarian pasta or spinach or wheat pasta is OK).  Multi-grain bread is OK - like multi-grain flat bread or sandwich thins. Avoid alcohol in excess. Exercise is also important.    Eat all the vegetables you want - avoid meat, especially red meat and dairy - especially cheese.  Cheese is the most concentrated form of trans-fats which is the worst thing to clog up our arteries. Veggie cheese is OK which can be found in the fresh produce section at [employer] or [employer] or [employer]  ++++++++++++++++++++++ DASH Eating Plan  DASH stands for "Dietary Approaches to Stop Hypertension."   The DASH eating plan is a healthy eating plan that has been shown to reduce high blood pressure (hypertension). Additional health benefits may include reducing the risk of type 2 diabetes mellitus, heart disease, and stroke. The DASH eating plan may also help with weight loss. WHAT DO I NEED TO KNOW ABOUT THE DASH EATING PLAN? For the DASH eating plan, you will follow these general guidelines:  Choose foods with a percent daily value for sodium of less than 5% (as listed on the food label).  Use salt-free seasonings or herbs instead of table salt or sea salt.  Check with your health care provider or pharmacist before using salt substitutes.  Eat lower-sodium products, often labeled as "lower sodium" or "no salt added."  Eat fresh foods.  Eat more vegetables, fruits, and low-fat dairy products.  Choose whole grains. Look for the word "whole" as the first word in the ingredient list.  Choose fish   Limit sweets, desserts, sugars, and sugary drinks.  Choose heart-healthy fats.  Eat  veggie cheese   Eat more home-cooked food and less  restaurant, buffet, and fast food.  Limit fried foods.  Cook foods using methods other than frying.  Limit canned vegetables. If you do use them, rinse them well to decrease the sodium.  When eating at a restaurant, ask that your food be prepared with less salt, or no salt if possible.                      WHAT FOODS CAN I EAT? Read Dr Fara Olden Fuhrman's books on The End of Dieting & The End of Diabetes  Grains Whole grain or whole wheat bread. Brown rice. Whole grain or whole wheat pasta. Quinoa, bulgur, and whole grain cereals. Low-sodium cereals. Corn or whole wheat flour tortillas. Whole grain cornbread. Whole grain crackers. Low-sodium crackers.  Vegetables Fresh or frozen vegetables (raw, steamed, roasted, or grilled). Low-sodium or reduced-sodium tomato and vegetable juices. Low-sodium or reduced-sodium tomato sauce and paste. Low-sodium or reduced-sodium canned vegetables.   Fruits All fresh, canned (in natural juice), or frozen fruits.  Protein Products  All fish and seafood.  Dried beans, peas, or lentils. Unsalted nuts and seeds. Unsalted canned beans.  Dairy Low-fat dairy products, such as skim or 1% milk, 2% or reduced-fat cheeses, low-fat ricotta or cottage cheese, or plain low-fat yogurt. Low-sodium or reduced-sodium cheeses.  Fats and Oils Tub margarines without trans fats. Light or reduced-fat mayonnaise and salad dressings (reduced sodium). Avocado. Safflower, olive, or canola oils. Natural peanut or almond butter.  Other Unsalted popcorn and pretzels. The items listed above may not be a complete list of recommended foods or beverages. Contact your dietitian for more options.  ++++++++++++++++++  WHAT FOODS ARE NOT RECOMMENDED? Grains/ White flour or wheat flour White bread. White pasta. White rice. Refined cornbread. Bagels and croissants. Crackers that contain trans fat.  Vegetables  Creamed or fried vegetables. Vegetables in a . Regular canned vegetables.  Regular canned tomato sauce and paste. Regular tomato and vegetable juices.  Fruits Dried fruits. Canned fruit in light or heavy syrup. Fruit juice.  Meat and Other Protein Products Meat in general - RED meat & White meat.  Fatty cuts of meat. Ribs, chicken wings, all processed meats as bacon, sausage, bologna, salami, fatback, hot dogs, bratwurst and packaged luncheon meats.  Dairy Whole or 2% milk, cream, half-and-half, and cream cheese. Whole-fat or sweetened yogurt. Full-fat cheeses or blue cheese. Non-dairy creamers and whipped toppings. Processed cheese, cheese spreads, or cheese curds.  Condiments Onion and garlic salt, seasoned salt, table salt, and sea salt. Canned and packaged gravies. Worcestershire sauce. Tartar sauce. Barbecue sauce. Teriyaki sauce. Soy sauce, including reduced sodium. Steak sauce. Fish sauce. Oyster sauce. Cocktail sauce. Horseradish. Ketchup and mustard. Meat flavorings and tenderizers. Bouillon cubes. Hot sauce. Tabasco sauce. Marinades. Taco seasonings. Relishes.  Fats and Oils Butter, stick margarine, lard, shortening and bacon fat. Coconut, palm kernel, or palm oils. Regular salad dressings.  Pickles and olives. Salted popcorn and pretzels.  The items listed above may not be a complete list of foods and beverages to avoid.

## 2017-07-23 ENCOUNTER — Telehealth: Payer: Self-pay | Admitting: *Deleted

## 2017-07-23 LAB — INSULIN, RANDOM: Insulin: 31.3 u[IU]/mL — ABNORMAL HIGH (ref 2.0–19.6)

## 2017-07-23 LAB — CBC WITH DIFFERENTIAL/PLATELET
Basophils Absolute: 49 cells/uL (ref 0–200)
Basophils Relative: 0.7 %
Eosinophils Absolute: 203 cells/uL (ref 15–500)
Eosinophils Relative: 2.9 %
HCT: 39.7 % (ref 35.0–45.0)
Hemoglobin: 13.9 g/dL (ref 11.7–15.5)
Lymphs Abs: 2373 cells/uL (ref 850–3900)
MCH: 32.6 pg (ref 27.0–33.0)
MCHC: 35 g/dL (ref 32.0–36.0)
MCV: 93 fL (ref 80.0–100.0)
MPV: 11 fL (ref 7.5–12.5)
Monocytes Relative: 8.6 %
Neutro Abs: 3773 cells/uL (ref 1500–7800)
Neutrophils Relative %: 53.9 %
Platelets: 306 10*3/uL (ref 140–400)
RBC: 4.27 10*6/uL (ref 3.80–5.10)
RDW: 12.7 % (ref 11.0–15.0)
Total Lymphocyte: 33.9 %
WBC mixed population: 602 cells/uL (ref 200–950)
WBC: 7 10*3/uL (ref 3.8–10.8)

## 2017-07-23 LAB — URINALYSIS, ROUTINE W REFLEX MICROSCOPIC
Bacteria, UA: NONE SEEN /HPF
Bilirubin Urine: NEGATIVE
Glucose, UA: NEGATIVE
Hgb urine dipstick: NEGATIVE
Hyaline Cast: NONE SEEN /LPF
Ketones, ur: NEGATIVE
Leukocytes, UA: NEGATIVE
Nitrite: NEGATIVE
Protein, ur: NEGATIVE
RBC / HPF: NONE SEEN /HPF (ref 0–2)
Specific Gravity, Urine: 1.006 (ref 1.001–1.03)
Squamous Epithelial / LPF: NONE SEEN /HPF (ref <=5)
WBC, UA: NONE SEEN /HPF (ref 0–5)
pH: 5.5 (ref 5.0–8.0)

## 2017-07-23 LAB — LIPID PANEL
Cholesterol: 174 mg/dL (ref <200)
HDL: 38 mg/dL — ABNORMAL LOW (ref >50)
Non-HDL Cholesterol (Calc): 136 mg/dL (calc) — ABNORMAL HIGH (ref <130)
Total CHOL/HDL Ratio: 4.6 (calc) (ref <5.0)
Triglycerides: 431 mg/dL — ABNORMAL HIGH (ref <150)

## 2017-07-23 LAB — HEPATIC FUNCTION PANEL
AG Ratio: 2.1 (calc) (ref 1.0–2.5)
ALT: 44 U/L — ABNORMAL HIGH (ref 6–29)
AST: 24 U/L (ref 10–35)
Albumin: 4.4 g/dL (ref 3.6–5.1)
Alkaline phosphatase (APISO): 89 U/L (ref 33–130)
Bilirubin, Direct: 0 mg/dL (ref 0.0–0.2)
Globulin: 2.1 g/dL (calc) (ref 1.9–3.7)
Indirect Bilirubin: 0.2 mg/dL (calc) (ref 0.2–1.2)
Total Bilirubin: 0.2 mg/dL (ref 0.2–1.2)
Total Protein: 6.5 g/dL (ref 6.1–8.1)

## 2017-07-23 LAB — IRON, TOTAL/TOTAL IRON BINDING CAP
%SAT: 26 % (calc) (ref 11–50)
Iron: 82 ug/dL (ref 45–160)
TIBC: 314 mcg/dL (calc) (ref 250–450)

## 2017-07-23 LAB — BASIC METABOLIC PANEL WITH GFR
BUN: 12 mg/dL (ref 7–25)
CO2: 30 mmol/L (ref 20–32)
Calcium: 9.8 mg/dL (ref 8.6–10.4)
Chloride: 106 mmol/L (ref 98–110)
Creat: 0.59 mg/dL (ref 0.50–1.05)
GFR, Est African American: 124 mL/min/{1.73_m2} (ref >=60)
GFR, Est Non African American: 107 mL/min/{1.73_m2} (ref >=60)
Glucose, Bld: 115 mg/dL — ABNORMAL HIGH (ref 65–99)
Potassium: 4.4 mmol/L (ref 3.5–5.3)
Sodium: 142 mmol/L (ref 135–146)

## 2017-07-23 LAB — HEMOGLOBIN A1C
Hgb A1c MFr Bld: 6.4 % of total Hgb — ABNORMAL HIGH (ref <5.7)
Mean Plasma Glucose: 137 (calc)
eAG (mmol/L): 7.6 (calc)

## 2017-07-23 LAB — MAGNESIUM: Magnesium: 1.9 mg/dL (ref 1.5–2.5)

## 2017-07-23 LAB — VITAMIN D 25 HYDROXY (VIT D DEFICIENCY, FRACTURES): Vit D, 25-Hydroxy: 27 ng/mL — ABNORMAL LOW (ref 30–100)

## 2017-07-23 LAB — MICROALBUMIN / CREATININE URINE RATIO
Creatinine, Urine: 24 mg/dL (ref 20–275)
Microalb Creat Ratio: 17 mcg/mg creat (ref <30)
Microalb, Ur: 0.4 mg/dL

## 2017-07-23 LAB — TSH: TSH: 2.38 mIU/L

## 2017-07-23 LAB — VITAMIN B12: Vitamin B-12: 236 pg/mL (ref 200–1100)

## 2017-07-23 NOTE — Telephone Encounter (Signed)
Patient was advised of the telephone number for [facility] so she can schedule her mammogram.  She was also advised she has an order [facility] and does not require an appointment time there.

## 2017-08-06 ENCOUNTER — Ambulatory Visit: Payer: Managed Care, Other (non HMO) | Admitting: Adult Health

## 2017-08-06 ENCOUNTER — Encounter: Payer: Self-pay | Admitting: Adult Health

## 2017-08-06 VITALS — BP 96/58 | HR 100 | Temp 97.5°F | Ht 62.0 in | Wt 170.0 lb

## 2017-08-06 DIAGNOSIS — R062 Wheezing: Secondary | ICD-10-CM | POA: Diagnosis not present

## 2017-08-06 DIAGNOSIS — R059 Cough, unspecified: Secondary | ICD-10-CM

## 2017-08-06 DIAGNOSIS — R05 Cough: Secondary | ICD-10-CM

## 2017-08-06 DIAGNOSIS — J0141 Acute recurrent pansinusitis: Secondary | ICD-10-CM

## 2017-08-06 MED ORDER — BENZONATATE 100 MG PO CAPS: 200.0000 mg | ORAL_CAPSULE | Freq: Three times a day (TID) | ORAL | 0 refills | Status: DC | PRN

## 2017-08-06 MED ORDER — ALBUTEROL SULFATE HFA 108 (90 BASE) MCG/ACT IN AERS: 2.0000 | INHALATION_SPRAY | RESPIRATORY_TRACT | 0 refills | Status: DC | PRN

## 2017-08-06 MED ORDER — PROMETHAZINE-DM 6.25-15 MG/5ML PO SYRP: 5.0000 mL | ORAL_SOLUTION | Freq: Four times a day (QID) | ORAL | 1 refills | Status: DC | PRN

## 2017-08-06 MED ORDER — PREDNISONE 20 MG PO TABS: ORAL_TABLET | ORAL | 0 refills | Status: DC

## 2017-08-06 MED ORDER — AZITHROMYCIN 250 MG PO TABS: ORAL_TABLET | ORAL | 1 refills | Status: AC

## 2017-08-06 MED ORDER — FLUTICASONE PROPIONATE 50 MCG/ACT NA SUSP: 1.0000 | Freq: Every day | NASAL | 2 refills | Status: DC

## 2017-08-06 NOTE — Progress Notes (Signed)
Assessment and Plan:  Caitlin Barnett was seen today for uri.  Diagnoses and all orders for this visit:  Wheezing -     albuterol (VENTOLIN HFA) 108 (90 Base) MCG/ACT inhaler; Inhale 2 puffs into the lungs every 4 (four) hours as needed for wheezing or shortness of breath. Please give generic or the one that insurance covers  Acute recurrent pansinusitis Suggested symptomatic OTC remedies. Nasal saline spray for congestion. Nasal steroids, allergy pill, oral steroids Follow up as needed - report back for uncontrollable fever, severe pain - worsening symptoms. -     predniSONE (DELTASONE) 20 MG tablet; 2 tablets daily for 3 days, 1 tablet daily for 4 days. -     azithromycin (ZITHROMAX) 250 MG tablet; Take 2 tablets (500 mg) on  Day 1,  followed by 1 tablet (250 mg) once daily on Days 2 through 5. -     fluticasone (FLONASE) 50 MCG/ACT nasal spray; Place 1 spray into both nostrils daily.  Cough -     benzonatate (TESSALON PERLES) 100 MG capsule; Take 2 capsules (200 mg total) by mouth 3 (three) times daily as needed for cough (Max: 600mg  per day). -     promethazine-dextromethorphan (PROMETHAZINE-DM) 6.25-15 MG/5ML syrup; Take 5 mLs by mouth 4 (four) times daily as needed for cough.   Further disposition pending results of labs. Discussed med's effects and SE's.   Over 15 minutes of exam, counseling, chart review, and critical decision making was performed.   Future Appointments  Date Time Provider Department Center  10/21/2017  3:45 PM Judd Gaudierorbett, Jurell Basista, NP [facility] None  01/28/2018  3:30 PM Lucky CowboyMcKeown, William, MD [facility] None    ------------------------------------------------------------------------------------------------------------------   HPI BP (!) 96/58   Pulse 100   Temp (!) 97.5 F (36.4 C)   Ht 5\' 2"  (1.575 m)   Wt 170 lb (77.1 kg)   SpO2 95%   BMI 31.09 kg/m   51 y.o.female presents for facial pressure, nasal congestions, productive cough, chest pressure x 9 days. She  reports intermittent fever, mild headache as well. Denies SOB, chest pain, dizziness.  She took motrin 400 mg for this this AM. She has been on zyrtec, singulair, albuterol inhaler. Takes flonase daily but has run out.   She reports intermittent ongoing symptoms for several months; hx recurrent sinus issues; s/p sinus surgery in 2003. Reports intermittent URI symptoms worse and ongoing since moving back from [place]   Past Medical History:  Diagnosis Date  . Diabetes mellitus without complication (HCC)   . Diverticulitis   . GERD (gastroesophageal reflux disease)   . Hyperlipidemia   . IBS (irritable bowel syndrome)   . Migraine   . Thyroid disease   . UTI (urinary tract infection)      Allergies  Allergen Reactions  . Tetanus Toxoids Other (See Comments)    Injection site abcess  . Sulfa Antibiotics Hives    itching    Current Outpatient Medications on File Prior to Visit  Medication Sig  . Butalbital-APAP-Caffeine (FIORICET) 50-300-40 MG CAPS Take 1 tablet by mouth 4 (four) times daily as needed. migrains  . cetirizine (ZYRTEC) 10 MG tablet Take 10 mg by mouth daily.  . cyanocobalamin (,VITAMIN B-12,) 1000 MCG/ML injection Inject 1,000 mcg into the muscle.  . estrogen, conjugated,-medroxyprogesterone (PREMPRO) 0.45-1.5 MG tablet Take 1 tablet daily  . levothyroxine (SYNTHROID, LEVOTHROID) 125 MCG tablet Take 1 tablet every morning on an empty stomach with only water for 30 minutes  . meloxicam (MOBIC) 7.5 MG tablet  Take 1 tablet (7.5 mg total) by mouth daily as needed for pain.  . metFORMIN (GLUCOPHAGE XR) 500 MG 24 hr tablet Take 2 tablets 2 x / day for Diabetes  . nitrofurantoin (MACRODANTIN) 100 MG capsule Take 100 mg by mouth 4 (four) times daily. 3 times a week PRN  . omeprazole (PRILOSEC) 40 MG capsule Take 1 capsule daily for acid indigestion  . pregabalin (LYRICA) 75 MG capsule Take 1 capsule 3 x / day for neuropathy pains  . montelukast (SINGULAIR) 10 MG tablet Take 1  tablet daily for Allergies   No current facility-administered medications on file prior to visit.     ROS: Review of Systems  Constitutional: Positive for chills and fever. Negative for diaphoresis and malaise/fatigue.  HENT: Positive for congestion and sinus pain. Negative for ear discharge, ear pain, hearing loss, sore throat and tinnitus.   Eyes: Negative for blurred vision, pain, discharge and redness.  Respiratory: Positive for cough. Negative for hemoptysis, sputum production, shortness of breath, wheezing and stridor.   Cardiovascular: Negative for chest pain, palpitations and orthopnea.  Gastrointestinal: Negative for abdominal pain, diarrhea, nausea and vomiting.  Genitourinary: Negative.   Musculoskeletal: Negative for joint pain and myalgias.  Skin: Negative for rash.  Neurological: Negative for dizziness, sensory change, weakness and headaches.  Endo/Heme/Allergies: Negative for environmental allergies.  Psychiatric/Behavioral: Negative.   All other systems reviewed and are negative.    Physical Exam:  BP (!) 96/58   Pulse 100   Temp (!) 97.5 F (36.4 C)   Ht 5\' 2"  (1.575 m)   Wt 170 lb (77.1 kg)   SpO2 95%   BMI 31.09 kg/m   General Appearance: Well nourished, in no apparent distress. Eyes: PERRLA, EOMs, conjunctiva no swelling or erythema Sinuses: Bilateral Frontal/maxillary tenderness ENT/Mouth: Ext aud canals clear, R TMs without erythema, bulging, L TM injected with white discoloration to lower 1/3 - per patient this is baseline. No erythema, swelling, or exudate on post pharynx.  Tonsils not swollen or erythematous. Hearing normal.  Neck: Supple, thyroid normal.  Respiratory: Respiratory effort normal, BS equal bilaterally without rales, rhonchi, wheezing or stridor.  Cardio: RRR with no MRGs. Brisk peripheral pulses without edema.  Abdomen: Soft, + BS.  Non tender, no guarding, rebound, hernias, masses. Lymphatics: Non tender without lymphadenopathy.   Musculoskeletal: Full ROM, 5/5 strength, normal gait.  Skin: Warm, dry without rashes, lesions, ecchymosis.  Neuro: Cranial nerves intact. Normal muscle tone, no cerebellar symptoms. Sensation intact.  Psych: Awake and oriented X 3, normal affect, Insight and Judgment appropriate.     Dan MakerAshley C Temitope Griffing, NP 3:06 PM [facility]

## 2017-08-06 NOTE — Patient Instructions (Signed)

## 2017-08-14 ENCOUNTER — Other Ambulatory Visit: Payer: Self-pay | Admitting: Adult Health

## 2017-08-14 ENCOUNTER — Telehealth: Payer: Self-pay

## 2017-08-14 DIAGNOSIS — J0141 Acute recurrent pansinusitis: Secondary | ICD-10-CM

## 2017-08-14 DIAGNOSIS — R062 Wheezing: Secondary | ICD-10-CM

## 2017-08-14 MED ORDER — PREDNISONE 20 MG PO TABS: ORAL_TABLET | ORAL | 0 refills | Status: DC

## 2017-08-14 MED ORDER — ALBUTEROL SULFATE HFA 108 (90 BASE) MCG/ACT IN AERS: 2.0000 | INHALATION_SPRAY | RESPIRATORY_TRACT | 0 refills | Status: DC | PRN

## 2017-08-14 MED ORDER — AZITHROMYCIN 250 MG PO TABS
ORAL_TABLET | ORAL | 1 refills | Status: AC
Start: 2017-08-14 — End: 2017-08-19

## 2017-08-14 NOTE — Telephone Encounter (Signed)
Sent in as requested 

## 2017-08-14 NOTE — Telephone Encounter (Signed)
Patient called and states that she got better after taking meds but the past 2 days she has been coughing up green mucus. Requesting 1 more round of z-pak and prednisone. Also, pharmacy says that they never received rx for Albuterol that was sent in on 08/07/27 and she really needs it.

## 2017-08-15 NOTE — Telephone Encounter (Signed)
Left detailed message on VM.

## 2017-08-18 ENCOUNTER — Other Ambulatory Visit: Payer: Self-pay | Admitting: Adult Health

## 2017-08-18 ENCOUNTER — Other Ambulatory Visit: Payer: Self-pay | Admitting: Internal Medicine

## 2017-08-18 ENCOUNTER — Telehealth: Payer: Self-pay

## 2017-08-18 DIAGNOSIS — R062 Wheezing: Secondary | ICD-10-CM

## 2017-08-18 MED ORDER — ALBUTEROL SULFATE HFA 108 (90 BASE) MCG/ACT IN AERS: 2.0000 | INHALATION_SPRAY | RESPIRATORY_TRACT | 0 refills | Status: DC | PRN

## 2017-08-18 MED ORDER — PROMETHAZINE-CODEINE 6.25-10 MG/5ML PO SYRP: 5.0000 mL | ORAL_SOLUTION | Freq: Four times a day (QID) | ORAL | 0 refills | Status: DC | PRN

## 2017-08-18 NOTE — Telephone Encounter (Signed)
Patient states that her coughing is really bad, requesting a new rx for something to help. Also, her inhaler still hasn't gone through to the pharmacy. Needs this really bad. Please advise.

## 2017-08-18 NOTE — Telephone Encounter (Signed)
Patient aware of meds being called into the pharmacy

## 2017-08-18 NOTE — Telephone Encounter (Signed)
Both Dr. Oneta RackMckeown and I have resent in albuterol to pharmacy on file today; sent in codeine cough syrup as well. Please call to verify that pharmacy received this prescription this afternoon and call patient back to let her know.

## 2017-10-14 ENCOUNTER — Other Ambulatory Visit: Payer: Self-pay | Admitting: Internal Medicine

## 2017-10-14 DIAGNOSIS — R062 Wheezing: Secondary | ICD-10-CM

## 2017-10-20 DIAGNOSIS — E1169 Type 2 diabetes mellitus with other specified complication: Secondary | ICD-10-CM | POA: Insufficient documentation

## 2017-10-20 DIAGNOSIS — E669 Obesity, unspecified: Secondary | ICD-10-CM | POA: Insufficient documentation

## 2017-10-20 DIAGNOSIS — K219 Gastro-esophageal reflux disease without esophagitis: Secondary | ICD-10-CM | POA: Insufficient documentation

## 2017-10-20 DIAGNOSIS — E785 Hyperlipidemia, unspecified: Secondary | ICD-10-CM

## 2017-10-20 NOTE — Progress Notes (Addendum)
FOLLOW UP  Assessment and Plan:   Palpitations with dyspnea/fatigue/LE edema EKG unremarkable today; will order Holter, cardiology referral placed for possible need extended holter and ECHO for new edema and fatigue x 6 months, suspect paroxysmal a. Fib -   Smoking cessation Recently quit, also some ?depression and wants to lose weight, will start low dose wellbutrin after discussion of risks and benefits  Polyarthralgia/stiffness/fatigue Per patient had autoimmune workup which was "negative" - report not available - she will request reports be sent to us and will follow up sooner if needed - poss fibromyalgia - will discuss further at follow up appointment - may schedule sooner than standard 3 months for this  Cholesterol Currently above goal; not on statin - statin and fenofibrate discussed today Continue low cholesterol diet and exercise.  Check lipid panel.   Diabetes with diabetic mononeuropathy Continue medication: metformin, lyrica Continue diet and exercise.  Perform daily foot/skin check, notify office of any concerning changes.  Check A1C  Obesity with co morbidities Long discussion about weight loss, diet, and exercise Recommended diet heavy in fruits and veggies and low in animal meats, cheeses, and dairy products, appropriate calorie intake Discussed ideal weight for height  Will follow up in 3 months  Hypothyroidism continue medications the same pending lab results reminded to take on an empty stomach 30-6560mins before food.  check TSH level  Vitamin D Def Very low at last visit; patient has started on supplement; continue to recommend supplementation for goal of 70-100 Defer Vit D level  Continue diet and meds as discussed. Further disposition pending results of labs. Discussed med's effects and SE's.   Over 30 minutes of exam, counseling, chart review, and critical decision making was performed.   Future Appointments  Date Time Provider Department Center   01/28/2018  3:30 PM Lucky CowboyMcKeown, William, MD [facility] None    ----------------------------------------------------------------------------------------------------------------------  HPI 52 y.o. female  presents for 3 month follow up on cholesterol, diabetes, hypothyroid, weight and vitamin D deficiency. She reports she recently quit smoking, has been noting intermittent palpitaions "feels like quivering" and has some dyspnea with this and has new peripheral edema for which she has been wearing compression hose. She denies CP, PND. She reports increased stress recently - grandmother passed away last week.   She also endorses ongoing fatigue, polymyalgias (PIP joints, wrists, elbows, hips, knees, ankles feet), joint stiffness - reports she had autoimmune workup which was "negative" at another office. She suspects fibromyalgia but has not been formally diagnosed.   BMI is Body mass index is 32.56 kg/m., she has not been working on diet and exercise. Wt Readings from Last 3 Encounters:  10/21/17 178 lb (80.7 kg)  08/06/17 170 lb (77.1 kg)  07/22/17 172 lb (78 kg)   Her blood pressure has been controlled at home, today their BP is BP: 100/60  She does not workout. She denies chest pain, shortness of breath, dizziness.   She is not on cholesterol medication and denies myalgias. Her cholesterol is not at goal. The cholesterol last visit was:   Lab Results  Component Value Date   CHOL 174 07/22/2017   HDL 38 (L) 07/22/2017   LDLCALC  07/22/2017     Comment:     . LDL cholesterol not calculated. Triglyceride levels greater than 400 mg/dL invalidate calculated LDL results. . Reference range: <100 . Desirable range <100 mg/dL for primary prevention;   <70 mg/dL for patients with CHD or diabetic patients  with > or = 2  CHD risk factors. Marland Kitchen LDL-C is now calculated using the Martin-Hopkins  calculation, which is a validated novel method providing  better accuracy than the Friedewald equation  in the  estimation of LDL-C.  Horald Pollen et al. Lenox Ahr. 1610;960(45): 2061-2068  (http://education.[employer].com/faq/FAQ164)    TRIG 431 (H) 07/22/2017   CHOLHDL 4.6 07/22/2017    She has not been working on diet and exercise for T2DM on metformin, and denies foot ulcerations, increased appetite, nausea, polydipsia, polyuria, visual disturbances, vomiting and weight loss. She is treated by lyrica for neuropathy. Last A1C in the office was:  Lab Results  Component Value Date   HGBA1C 6.4 (H) 07/22/2017   Patient is not on Vitamin D supplement and very low at last check:    Lab Results  Component Value Date   VD25OH 27 (L) 07/22/2017     She is on thyroid medication. Her medication was not changed last visit.   Lab Results  Component Value Date   TSH 2.38 07/22/2017     Current Medications:  Current Outpatient Medications on File Prior to Visit  Medication Sig  . albuterol (PROVENTIL HFA;VENTOLIN HFA) 108 (90 Base) MCG/ACT inhaler INHALE 2 PUFFS INTO THE LUNGS EVERY 4 HOURS AS NEEDED FOR WHEEZING OR SHORTNESS OF BREATH.  . benzonatate (TESSALON PERLES) 100 MG capsule Take 2 capsules (200 mg total) by mouth 3 (three) times daily as needed for cough (Max: 600mg  per day).  . Butalbital-APAP-Caffeine (FIORICET) 50-300-40 MG CAPS Take 1 tablet by mouth 4 (four) times daily as needed. migrains  . cetirizine (ZYRTEC) 10 MG tablet Take 10 mg by mouth daily.  . cyanocobalamin (,VITAMIN B-12,) 1000 MCG/ML injection Inject 1,000 mcg into the muscle.  . estrogen, conjugated,-medroxyprogesterone (PREMPRO) 0.45-1.5 MG tablet Take 1 tablet daily  . fluticasone (FLONASE) 50 MCG/ACT nasal spray Place 1 spray into both nostrils daily.  Marland Kitchen levothyroxine (SYNTHROID, LEVOTHROID) 125 MCG tablet Take 1 tablet every morning on an empty stomach with only water for 30 minutes  . metFORMIN (GLUCOPHAGE XR) 500 MG 24 hr tablet Take 2 tablets 2 x / day for Diabetes  . nitrofurantoin (MACRODANTIN) 100 MG  capsule Take 100 mg by mouth 4 (four) times daily. 3 times a week PRN  . omeprazole (PRILOSEC) 40 MG capsule Take 1 capsule daily for acid indigestion  . pregabalin (LYRICA) 75 MG capsule Take 1 capsule 3 x / day for neuropathy pains  . promethazine-codeine (PHENERGAN WITH CODEINE) 6.25-10 MG/5ML syrup Take 5 mLs by mouth every 6 (six) hours as needed for cough. Max: 20mL per day  . promethazine-dextromethorphan (PROMETHAZINE-DM) 6.25-15 MG/5ML syrup Take 5 mLs by mouth 4 (four) times daily as needed for cough.  . meloxicam (MOBIC) 7.5 MG tablet Take 1 tablet (7.5 mg total) by mouth daily as needed for pain. (Patient not taking: Reported on 10/21/2017)  . montelukast (SINGULAIR) 10 MG tablet Take 1 tablet daily for Allergies  . predniSONE (DELTASONE) 20 MG tablet 2 tablets daily for 3 days, 1 tablet daily for 4 days. (Patient not taking: Reported on 10/21/2017)   No current facility-administered medications on file prior to visit.      Allergies:  Allergies  Allergen Reactions  . Tetanus Toxoids Other (See Comments)    Injection site abcess  . Sulfa Antibiotics Hives    itching     Medical History:  Past Medical History:  Diagnosis Date  . Diabetes mellitus without complication (HCC)   . Diverticulitis   . GERD (gastroesophageal reflux disease)   .  Hyperlipidemia   . IBS (irritable bowel syndrome)   . Migraine   . Thyroid disease   . UTI (urinary tract infection)    Family history- Reviewed and unchanged Social history- Reviewed and unchanged   Review of Systems:  Review of Systems  Constitutional: Positive for malaise/fatigue. Negative for chills, fever and weight loss.  HENT: Negative for hearing loss and tinnitus.   Eyes: Negative for blurred vision and double vision.  Respiratory: Positive for shortness of breath. Negative for cough, sputum production and wheezing.   Cardiovascular: Positive for palpitations and leg swelling. Negative for chest pain, orthopnea,  claudication and PND.  Gastrointestinal: Negative for abdominal pain, blood in stool, constipation, diarrhea, heartburn, melena, nausea and vomiting.  Genitourinary: Negative.   Musculoskeletal: Positive for joint pain. Negative for myalgias.  Skin: Negative for rash.  Neurological: Negative for dizziness, tingling, sensory change, weakness and headaches.  Endo/Heme/Allergies: Negative for polydipsia.  Psychiatric/Behavioral: Negative for depression, substance abuse and suicidal ideas. The patient has insomnia. The patient is not nervous/anxious.   All other systems reviewed and are negative.     Physical Exam: BP 100/60   Pulse 85   Temp 97.9 F (36.6 C)   Ht 5\' 2"  (1.575 m)   Wt 178 lb (80.7 kg)   SpO2 95%   BMI 32.56 kg/m  Wt Readings from Last 3 Encounters:  10/21/17 178 lb (80.7 kg)  08/06/17 170 lb (77.1 kg)  07/22/17 172 lb (78 kg)   General Appearance: Well nourished, in no apparent distress. Eyes: PERRLA, EOMs, conjunctiva no swelling or erythema Sinuses: No Frontal/maxillary tenderness ENT/Mouth: Ext aud canals clear, TMs without erythema, bulging. No erythema, swelling, or exudate on post pharynx.  Tonsils not swollen or erythematous. Hearing normal.  Neck: Supple, thyroid normal.  Respiratory: Respiratory effort normal, BS equal bilaterally without rales, rhonchi, wheezing or stridor.  Cardio: RRR with no MRGs. Brisk peripheral pulses with scant edema to ankles (wearing bilateral compression hose).  Abdomen: Soft, + BS.  Non tender, no guarding, rebound, hernias, masses. Lymphatics: Non tender without lymphadenopathy.  Musculoskeletal: Full ROM, 5/5 strength, Normal gait Skin: Warm, dry without rashes, lesions, ecchymosis.  Neuro: Cranial nerves intact. No cerebellar symptoms.  Psych: Awake and oriented X 3, normal affect, Insight and Judgment appropriate.    Dan Maker, NP 4:05 PM [facility]

## 2017-10-21 ENCOUNTER — Encounter: Payer: Self-pay | Admitting: Adult Health

## 2017-10-21 ENCOUNTER — Ambulatory Visit: Payer: Managed Care, Other (non HMO) | Admitting: Adult Health

## 2017-10-21 VITALS — BP 100/60 | HR 85 | Temp 97.9°F | Ht 62.0 in | Wt 178.0 lb

## 2017-10-21 DIAGNOSIS — K219 Gastro-esophageal reflux disease without esophagitis: Secondary | ICD-10-CM | POA: Diagnosis not present

## 2017-10-21 DIAGNOSIS — Z79899 Other long term (current) drug therapy: Secondary | ICD-10-CM

## 2017-10-21 DIAGNOSIS — E66811 Obesity, class 1: Secondary | ICD-10-CM

## 2017-10-21 DIAGNOSIS — E669 Obesity, unspecified: Secondary | ICD-10-CM | POA: Diagnosis not present

## 2017-10-21 DIAGNOSIS — E1140 Type 2 diabetes mellitus with diabetic neuropathy, unspecified: Secondary | ICD-10-CM | POA: Diagnosis not present

## 2017-10-21 DIAGNOSIS — R002 Palpitations: Secondary | ICD-10-CM

## 2017-10-21 DIAGNOSIS — R600 Localized edema: Secondary | ICD-10-CM

## 2017-10-21 DIAGNOSIS — R5383 Other fatigue: Secondary | ICD-10-CM | POA: Diagnosis not present

## 2017-10-21 DIAGNOSIS — E782 Mixed hyperlipidemia: Secondary | ICD-10-CM

## 2017-10-21 DIAGNOSIS — E039 Hypothyroidism, unspecified: Secondary | ICD-10-CM | POA: Diagnosis not present

## 2017-10-21 DIAGNOSIS — M2550 Pain in unspecified joint: Secondary | ICD-10-CM | POA: Diagnosis not present

## 2017-10-21 DIAGNOSIS — F17210 Nicotine dependence, cigarettes, uncomplicated: Secondary | ICD-10-CM | POA: Diagnosis not present

## 2017-10-21 DIAGNOSIS — E559 Vitamin D deficiency, unspecified: Secondary | ICD-10-CM | POA: Diagnosis not present

## 2017-10-21 MED ORDER — BUPROPION HCL ER (XL) 150 MG PO TB24: 150.0000 mg | ORAL_TABLET | ORAL | 1 refills | Status: DC

## 2017-10-22 ENCOUNTER — Other Ambulatory Visit: Payer: Self-pay | Admitting: Adult Health

## 2017-10-22 LAB — BASIC METABOLIC PANEL WITH GFR
BUN: 16 mg/dL (ref 7–25)
CO2: 26 mmol/L (ref 20–32)
Calcium: 9.1 mg/dL (ref 8.6–10.4)
Chloride: 104 mmol/L (ref 98–110)
Creat: 0.82 mg/dL (ref 0.50–1.05)
GFR, Est African American: 97 mL/min/{1.73_m2} (ref >=60)
GFR, Est Non African American: 83 mL/min/{1.73_m2} (ref >=60)
Glucose, Bld: 192 mg/dL — ABNORMAL HIGH (ref 65–99)
Potassium: 4 mmol/L (ref 3.5–5.3)
Sodium: 140 mmol/L (ref 135–146)

## 2017-10-22 LAB — CBC WITH DIFFERENTIAL/PLATELET
Basophils Absolute: 29 cells/uL (ref 0–200)
Basophils Relative: 0.5 %
Eosinophils Absolute: 220 cells/uL (ref 15–500)
Eosinophils Relative: 3.8 %
HCT: 37 % (ref 35.0–45.0)
Hemoglobin: 12.8 g/dL (ref 11.7–15.5)
Lymphs Abs: 1955 cells/uL (ref 850–3900)
MCH: 31.4 pg (ref 27.0–33.0)
MCHC: 34.6 g/dL (ref 32.0–36.0)
MCV: 90.7 fL (ref 80.0–100.0)
MPV: 11.2 fL (ref 7.5–12.5)
Monocytes Relative: 8.5 %
Neutro Abs: 3103 cells/uL (ref 1500–7800)
Neutrophils Relative %: 53.5 %
Platelets: 285 10*3/uL (ref 140–400)
RBC: 4.08 10*6/uL (ref 3.80–5.10)
RDW: 12 % (ref 11.0–15.0)
Total Lymphocyte: 33.7 %
WBC mixed population: 493 cells/uL (ref 200–950)
WBC: 5.8 10*3/uL (ref 3.8–10.8)

## 2017-10-22 LAB — HEPATIC FUNCTION PANEL
AG Ratio: 2 (calc) (ref 1.0–2.5)
ALT: 32 U/L — ABNORMAL HIGH (ref 6–29)
AST: 29 U/L (ref 10–35)
Albumin: 4.1 g/dL (ref 3.6–5.1)
Alkaline phosphatase (APISO): 98 U/L (ref 33–130)
Bilirubin, Direct: 0.1 mg/dL (ref 0.0–0.2)
Globulin: 2.1 g/dL (calc) (ref 1.9–3.7)
Indirect Bilirubin: 0.2 mg/dL (calc) (ref 0.2–1.2)
Total Bilirubin: 0.3 mg/dL (ref 0.2–1.2)
Total Protein: 6.2 g/dL (ref 6.1–8.1)

## 2017-10-22 LAB — LIPID PANEL
Cholesterol: 147 mg/dL (ref <200)
HDL: 40 mg/dL — ABNORMAL LOW (ref >50)
LDL Cholesterol (Calc): 59 mg/dL (calc)
Non-HDL Cholesterol (Calc): 107 mg/dL (calc) (ref <130)
Total CHOL/HDL Ratio: 3.7 (calc) (ref <5.0)
Triglycerides: 397 mg/dL — ABNORMAL HIGH (ref <150)

## 2017-10-22 LAB — HEMOGLOBIN A1C
Hgb A1c MFr Bld: 7.3 % of total Hgb — ABNORMAL HIGH (ref <5.7)
Mean Plasma Glucose: 163 (calc)
eAG (mmol/L): 9 (calc)

## 2017-10-22 LAB — TSH: TSH: 0.72 mIU/L

## 2017-10-22 LAB — MAGNESIUM: Magnesium: 1.6 mg/dL (ref 1.5–2.5)

## 2017-10-22 LAB — VITAMIN D 25 HYDROXY (VIT D DEFICIENCY, FRACTURES): Vit D, 25-Hydroxy: 55 ng/mL (ref 30–100)

## 2017-10-22 MED ORDER — FENOFIBRATE 145 MG PO TABS: 145.0000 mg | ORAL_TABLET | Freq: Every day | ORAL | 1 refills | Status: DC

## 2017-10-24 ENCOUNTER — Encounter: Payer: Self-pay | Admitting: Adult Health

## 2017-10-26 ENCOUNTER — Other Ambulatory Visit: Payer: Self-pay | Admitting: Adult Health

## 2017-10-26 DIAGNOSIS — H65112 Acute and subacute allergic otitis media (mucoid) (sanguinous) (serous), left ear: Secondary | ICD-10-CM

## 2017-10-26 MED ORDER — AMOXICILLIN-POT CLAVULANATE 875-125 MG PO TABS: 1.0000 | ORAL_TABLET | Freq: Two times a day (BID) | ORAL | 0 refills | Status: AC

## 2017-10-26 MED ORDER — PREDNISONE 20 MG PO TABS: ORAL_TABLET | ORAL | 0 refills | Status: DC

## 2017-10-27 ENCOUNTER — Ambulatory Visit (INDEPENDENT_AMBULATORY_CARE_PROVIDER_SITE_OTHER): Payer: Managed Care, Other (non HMO)

## 2017-10-27 DIAGNOSIS — R002 Palpitations: Secondary | ICD-10-CM

## 2017-10-27 DIAGNOSIS — R5383 Other fatigue: Secondary | ICD-10-CM

## 2017-11-07 ENCOUNTER — Encounter: Payer: Self-pay | Admitting: Cardiovascular Disease

## 2017-11-07 ENCOUNTER — Ambulatory Visit: Payer: Managed Care, Other (non HMO) | Admitting: Cardiovascular Disease

## 2017-11-07 VITALS — BP 108/76 | HR 96 | Ht 62.0 in | Wt 173.0 lb

## 2017-11-07 DIAGNOSIS — R002 Palpitations: Secondary | ICD-10-CM | POA: Diagnosis not present

## 2017-11-07 DIAGNOSIS — R072 Precordial pain: Secondary | ICD-10-CM

## 2017-11-07 DIAGNOSIS — N644 Mastodynia: Secondary | ICD-10-CM | POA: Insufficient documentation

## 2017-11-07 DIAGNOSIS — R600 Localized edema: Secondary | ICD-10-CM | POA: Insufficient documentation

## 2017-11-07 DIAGNOSIS — R0600 Dyspnea, unspecified: Secondary | ICD-10-CM

## 2017-11-07 DIAGNOSIS — I739 Peripheral vascular disease, unspecified: Secondary | ICD-10-CM | POA: Diagnosis not present

## 2017-11-07 DIAGNOSIS — R0609 Other forms of dyspnea: Secondary | ICD-10-CM

## 2017-11-07 NOTE — Assessment & Plan Note (Signed)
History of recent onset dyspnea on exertion exertion in association with her chest pain. I am going to get a 2-D echo to further evaluate. She has also noticed bilateral lower extremity edema during the same time.

## 2017-11-07 NOTE — Assessment & Plan Note (Addendum)
Complaints of claudication although she has palpable pedal pulses. I am going to get lower extremity arterial Doppler studies to further evaluate

## 2017-11-07 NOTE — Assessment & Plan Note (Signed)
History of hyperlipidemia on fenofibrate followed by her PCP 

## 2017-11-07 NOTE — Assessment & Plan Note (Addendum)
History of palpitations over the last several months with recent Holter monitor that showed sinus tachycardia, sinus rhythm, PACs and PVCs. There is no evidence of nonsustained ventricular tachycardia, PSVT or PAF although she said that she did not have her typical "symptoms" while wearing the Holter monitor. I am going to order a 30 day event monitor to further evaluate.

## 2017-11-07 NOTE — Patient Instructions (Signed)
Medication Instructions: Your physician recommends that you continue on your current medications as directed. Please refer to the Current Medication list given to you today.   Testing/Procedures: Your physician has recommended that you wear a 30 day event monitor. Event monitors are medical devices that record the heart's electrical activity. Doctors most often us these monitors to diagnose arrhythmias. Arrhythmias are problems with the speed or rhythm of the heartbeat. The monitor is a small, portable device. You can wear one while you do your normal daily activities. This is usually used to diagnose what is causing palpitations/syncope (passing out).  Your physician has requested that you have an echocardiogram. Echocardiography is a painless test that uses sound waves to create images of your heart. It provides your doctor with information about the size and shape of your heart and how well your heart's chambers and valves are working. This procedure takes approximately one hour. There are no restrictions for this procedure.  Your physician has requested that you have an exercise stress myoview. For further information please visit [email or web address]. Please follow instruction sheet, as given.  Your physician has requested that you have a lower extremity arterial duplex. During this test, ultrasound is used to evaluate arterial blood flow in the legs. Allow one hour for this exam. There are no restrictions or special instructions.  Your physician has requested that you have an ankle brachial index (ABI). During this test an ultrasound and blood pressure cuff are used to evaluate the arteries that supply the arms and legs with blood. Allow thirty minutes for this exam. There are no restrictions or special instructions.   Follow-Up: Your physician recommends that you schedule a follow-up appointment after testing with Dr. Allyson SabalBerry.

## 2017-11-07 NOTE — Progress Notes (Signed)
11/07/2017 [name]   [birth year]  [id number]  Primary Physician [name], MD Primary Cardiologist: Runell Gess MD Nicholes Calamity, [place]  HPI:  Caitlin Barnett is a 52 y.o. mildly overweight married Caucasian female mother of 2, grandmother of hypertension works as a Nurse, mental health at [employer]. She was referred by Judd Gaudier nurse practitioner for cardiovascular evaluation because of palpitations, chest pain dyspnea and edema. Reflexes include 15-pack-years of tobacco abuse having quit back in January of this year as well as reated hyperlipidemia and diabetes. There is no family history. She has never had a heart attack or stroke. Nonetheless 3 months she's noticed increasing fatigue, dyspnea on exertion, lower extremity edema and atypical chest pain occurring on a weekly basis. She is also noted palpitations and had a Holter monitor performed recently that showed sinus rhythm/sinus tachycardia with PACs and PVCs.   Current Meds  Medication Sig  . albuterol (PROVENTIL HFA;VENTOLIN HFA) 108 (90 Base) MCG/ACT inhaler INHALE 2 PUFFS INTO THE LUNGS EVERY 4 HOURS AS NEEDED FOR WHEEZING OR SHORTNESS OF BREATH.  Marland Kitchen aspirin EC 81 MG tablet Take 81 mg by mouth daily.  Marland Kitchen buPROPion (WELLBUTRIN XL) 150 MG 24 hr tablet Take 1 tablet (150 mg total) by mouth every morning.  . Butalbital-APAP-Caffeine (FIORICET) 50-300-40 MG CAPS Take 1 tablet by mouth 4 (four) times daily as needed. migrains  . cetirizine (ZYRTEC) 10 MG tablet Take 10 mg by mouth daily.  . Cholecalciferol 10000 units CAPS Take 1 tablet by mouth daily.  . Cinnamon 500 MG TABS Take 2 capsules by mouth 2 (two) times daily.  . cyanocobalamin (,VITAMIN B-12,) 1000 MCG/ML injection Inject 1,000 mcg into the muscle.  . estrogen, conjugated,-medroxyprogesterone (PREMPRO) 0.45-1.5 MG tablet Take 1 tablet daily  . fenofibrate (TRICOR) 145 MG tablet Take 1 tablet (145 mg total) by mouth daily.  . fluticasone (FLONASE) 50  MCG/ACT nasal spray Place 1 spray into both nostrils daily.  Marland Kitchen levothyroxine (SYNTHROID, LEVOTHROID) 125 MCG tablet Take 1 tablet every morning on an empty stomach with only water for 30 minutes  . metFORMIN (GLUCOPHAGE XR) 500 MG 24 hr tablet Take 2 tablets 2 x / day for Diabetes  . nitrofurantoin (MACRODANTIN) 100 MG capsule Take 100 mg by mouth 4 (four) times daily. 3 times a week PRN  . omeprazole (PRILOSEC) 40 MG capsule Take 1 capsule daily for acid indigestion  . pregabalin (LYRICA) 75 MG capsule Take 1 capsule 3 x / day for neuropathy pains     Allergies  Allergen Reactions  . Tetanus Toxoids Other (See Comments)    Injection site abcess  . Sulfa Antibiotics Hives    itching    Social History   Socioeconomic History  . Marital status: Married    Spouse name: Not on file  . Number of children: Not on file  . Years of education: Not on file  . Highest education level: Not on file  Occupational History  . Not on file  Social Needs  . Financial resource strain: Not on file  . Food insecurity:    Worry: Not on file    Inability: Not on file  . Transportation needs:    Medical: Not on file    Non-medical: Not on file  Tobacco Use  . Smoking status: Former Smoker    Packs/day: 0.50    Years: 30.00    Pack years: 15.00    Types: Cigarettes    Last attempt to quit:  08/24/2017    Years since quitting: 0.2  . Smokeless tobacco: Never Used  Substance and Sexual Activity  . Alcohol use: No  . Drug use: No  . Sexual activity: Not on file  Lifestyle  . Physical activity:    Days per week: Not on file    Minutes per session: Not on file  . Stress: Not on file  Relationships  . Social connections:    Talks on phone: Not on file    Gets together: Not on file    Attends religious service: Not on file    Active member of club or organization: Not on file    Attends meetings of clubs or organizations: Not on file    Relationship status: Not on file  . Intimate partner  violence:    Fear of current or ex partner: Not on file    Emotionally abused: Not on file    Physically abused: Not on file    Forced sexual activity: Not on file  Other Topics Concern  . Not on file  Social History Narrative  . Not on file     Review of Systems: General: negative for chills, fever, night sweats or weight changes.  Cardiovascular: negative for chest pain, dyspnea on exertion, edema, orthopnea, palpitations, paroxysmal nocturnal dyspnea or shortness of breath Dermatological: negative for rash Respiratory: negative for cough or wheezing Urologic: negative for hematuria Abdominal: negative for nausea, vomiting, diarrhea, bright red blood per rectum, melena, or hematemesis Neurologic: negative for visual changes, syncope, or dizziness All other systems reviewed and are otherwise negative except as noted above.    Blood pressure 108/76, pulse 96, height 5\' 2"  (1.575 m), weight 173 lb (78.5 kg).  General appearance: alert and no distress Neck: no adenopathy, no carotid bruit, no JVD, supple, symmetrical, trachea midline and thyroid not enlarged, symmetric, no tenderness/mass/nodules Lungs: clear to auscultation bilaterally Heart: regular rate and rhythm, S1, S2 normal, no murmur, click, rub or gallop Extremities: extremities normal, atraumatic, no cyanosis or edema Pulses: 2+ and symmetric Skin: Skin color, texture, turgor normal. No rashes or lesions Neurologic: Alert and oriented X 3, normal strength and tone. Normal symmetric reflexes. Normal coordination and gait  EKG not performed today  ASSESSMENT AND PLAN:   Claudication in peripheral vascular disease (HCC) Complaints of claudication although she has palpable pedal pulses. I am going to get lower extremity arterial Doppler studies to further evaluate  Hyperlipidemia History of hyperlipidemia on fenofibrate followed by her PCP  Palpitations History of palpitations over the last several months with recent  Holter monitor that showed sinus tachycardia, sinus rhythm, PACs and PVCs. There is no evidence of nonsustained ventricular tachycardia, PSVT or PAF although she said that she did not have her typical "symptoms" while wearing the Holter monitor. I am going to order a 30 day event monitor to further evaluate.  Chest pain History of new onset chest pain in the last 3 months occurring weekly associated with increasing shortness of breath/dyspnea on exertion. Risk factors do include 15-pack-years of tobacco abuse having quit in January of this year, treated diabetes and hyperlipidemia. I am going to get an exercise Myoview stress test to further evaluate  Dyspnea on exertion History of recent onset dyspnea on exertion exertion in association with her chest pain. I am going to get a 2-D echo to further evaluate. She has also noticed bilateral lower extremity edema during the same time.      Runell GessJonathan J. Marji Kuehnel MD FACP,FACC,FAHA, [facility] 11/07/2017  11:22 AM

## 2017-11-07 NOTE — Assessment & Plan Note (Signed)
History of new onset chest pain in the last 3 months occurring weekly associated with increasing shortness of breath/dyspnea on exertion. Risk factors do include 15-pack-years of tobacco abuse having quit in January of this year, treated diabetes and hyperlipidemia. I am going to get an exercise Myoview stress test to further evaluate

## 2017-11-10 DIAGNOSIS — J189 Pneumonia, unspecified organism: Secondary | ICD-10-CM

## 2017-11-10 HISTORY — DX: Pneumonia, unspecified organism: J18.9

## 2017-11-20 ENCOUNTER — Other Ambulatory Visit: Payer: Self-pay | Admitting: Cardiovascular Disease

## 2017-11-20 DIAGNOSIS — I739 Peripheral vascular disease, unspecified: Secondary | ICD-10-CM

## 2017-11-21 ENCOUNTER — Encounter: Payer: Self-pay | Admitting: Cardiovascular Disease

## 2017-11-24 ENCOUNTER — Ambulatory Visit (INDEPENDENT_AMBULATORY_CARE_PROVIDER_SITE_OTHER): Payer: Managed Care, Other (non HMO)

## 2017-11-24 ENCOUNTER — Other Ambulatory Visit: Payer: Self-pay

## 2017-11-24 ENCOUNTER — Ambulatory Visit (HOSPITAL_COMMUNITY): Payer: Managed Care, Other (non HMO) | Attending: Cardiology

## 2017-11-24 DIAGNOSIS — R002 Palpitations: Secondary | ICD-10-CM

## 2017-11-24 DIAGNOSIS — R079 Chest pain, unspecified: Secondary | ICD-10-CM | POA: Diagnosis not present

## 2017-11-24 DIAGNOSIS — R072 Precordial pain: Secondary | ICD-10-CM | POA: Diagnosis not present

## 2017-11-24 DIAGNOSIS — R600 Localized edema: Secondary | ICD-10-CM | POA: Diagnosis not present

## 2017-11-24 DIAGNOSIS — I493 Ventricular premature depolarization: Secondary | ICD-10-CM | POA: Insufficient documentation

## 2017-11-24 DIAGNOSIS — R0609 Other forms of dyspnea: Secondary | ICD-10-CM

## 2017-11-24 DIAGNOSIS — R0600 Dyspnea, unspecified: Secondary | ICD-10-CM | POA: Diagnosis not present

## 2017-11-24 DIAGNOSIS — Z87891 Personal history of nicotine dependence: Secondary | ICD-10-CM | POA: Insufficient documentation

## 2017-11-24 DIAGNOSIS — I5189 Other ill-defined heart diseases: Secondary | ICD-10-CM | POA: Insufficient documentation

## 2017-11-25 ENCOUNTER — Telehealth (HOSPITAL_COMMUNITY): Payer: Self-pay

## 2017-11-25 NOTE — Telephone Encounter (Signed)
Encounter complete. 

## 2017-11-27 ENCOUNTER — Encounter (HOSPITAL_COMMUNITY): Payer: Managed Care, Other (non HMO)

## 2017-11-27 ENCOUNTER — Ambulatory Visit (HOSPITAL_COMMUNITY)
Admission: RE | Admit: 2017-11-27 | Discharge: 2017-11-27 | Disposition: A | Discharge status: Home / Self Care | Payer: Managed Care, Other (non HMO) | Source: Ambulatory Visit | Attending: Cardiovascular Disease | Admitting: Cardiovascular Disease

## 2017-11-27 DIAGNOSIS — R072 Precordial pain: Secondary | ICD-10-CM | POA: Insufficient documentation

## 2017-11-27 DIAGNOSIS — E663 Overweight: Secondary | ICD-10-CM | POA: Insufficient documentation

## 2017-11-27 DIAGNOSIS — E119 Type 2 diabetes mellitus without complications: Secondary | ICD-10-CM | POA: Insufficient documentation

## 2017-11-27 DIAGNOSIS — R002 Palpitations: Secondary | ICD-10-CM | POA: Diagnosis not present

## 2017-11-27 DIAGNOSIS — I739 Peripheral vascular disease, unspecified: Secondary | ICD-10-CM

## 2017-11-27 DIAGNOSIS — I10 Essential (primary) hypertension: Secondary | ICD-10-CM | POA: Insufficient documentation

## 2017-11-27 DIAGNOSIS — Z6831 Body mass index (BMI) 31.0-31.9, adult: Secondary | ICD-10-CM | POA: Insufficient documentation

## 2017-11-27 DIAGNOSIS — R0609 Other forms of dyspnea: Secondary | ICD-10-CM | POA: Insufficient documentation

## 2017-11-27 DIAGNOSIS — R0600 Dyspnea, unspecified: Secondary | ICD-10-CM

## 2017-11-27 DIAGNOSIS — E785 Hyperlipidemia, unspecified: Secondary | ICD-10-CM | POA: Diagnosis not present

## 2017-11-27 DIAGNOSIS — Z87891 Personal history of nicotine dependence: Secondary | ICD-10-CM | POA: Diagnosis not present

## 2017-11-27 DIAGNOSIS — E1151 Type 2 diabetes mellitus with diabetic peripheral angiopathy without gangrene: Secondary | ICD-10-CM | POA: Diagnosis not present

## 2017-11-27 DIAGNOSIS — R5383 Other fatigue: Secondary | ICD-10-CM | POA: Diagnosis not present

## 2017-11-27 DIAGNOSIS — E039 Hypothyroidism, unspecified: Secondary | ICD-10-CM | POA: Diagnosis not present

## 2017-11-27 LAB — MYOCARDIAL PERFUSION IMAGING
Estimated workload: 7.4 METS
Exercise duration (min): 7 min
Exercise duration (sec): 0 s
LV dias vol: 52 mL (ref 46–106)
LV sys vol: 14 mL
MPHR: 170 {beats}/min
Peak HR: 153 {beats}/min
Percent HR: 90 %
RPE: 18
Rest HR: 78 {beats}/min
SDS: 0
SRS: 0
SSS: 0
TID: 0.84

## 2017-11-27 MED ORDER — TECHNETIUM TC 99M TETROFOSMIN IV KIT
10.6000 | PACK | Freq: Once | INTRAVENOUS | Status: AC | PRN
  Administered 2017-11-27: 10.6 via INTRAVENOUS
  Filled 2017-11-27: qty 11

## 2017-11-27 MED ORDER — TECHNETIUM TC 99M TETROFOSMIN IV KIT
32.0000 | PACK | Freq: Once | INTRAVENOUS | Status: AC | PRN
  Administered 2017-11-27: 32 via INTRAVENOUS
  Filled 2017-11-27: qty 32

## 2017-12-01 ENCOUNTER — Inpatient Hospital Stay (HOSPITAL_COMMUNITY)
Admission: EM | Admit: 2017-12-01 | Discharge: 2017-12-05 | DRG: 193 | Disposition: A | Discharge status: Home / Self Care | Payer: Managed Care, Other (non HMO) | Attending: Internal Medicine | Admitting: Internal Medicine

## 2017-12-01 ENCOUNTER — Encounter (HOSPITAL_COMMUNITY): Payer: Self-pay | Admitting: Emergency Medicine

## 2017-12-01 ENCOUNTER — Emergency Department (HOSPITAL_COMMUNITY): Payer: Managed Care, Other (non HMO)

## 2017-12-01 ENCOUNTER — Other Ambulatory Visit: Payer: Self-pay

## 2017-12-01 DIAGNOSIS — G43909 Migraine, unspecified, not intractable, without status migrainosus: Secondary | ICD-10-CM | POA: Diagnosis present

## 2017-12-01 DIAGNOSIS — E118 Type 2 diabetes mellitus with unspecified complications: Secondary | ICD-10-CM | POA: Diagnosis not present

## 2017-12-01 DIAGNOSIS — E1165 Type 2 diabetes mellitus with hyperglycemia: Secondary | ICD-10-CM | POA: Diagnosis not present

## 2017-12-01 DIAGNOSIS — E538 Deficiency of other specified B group vitamins: Secondary | ICD-10-CM | POA: Diagnosis present

## 2017-12-01 DIAGNOSIS — F419 Anxiety disorder, unspecified: Secondary | ICD-10-CM | POA: Diagnosis present

## 2017-12-01 DIAGNOSIS — Z780 Asymptomatic menopausal state: Secondary | ICD-10-CM

## 2017-12-01 DIAGNOSIS — E039 Hypothyroidism, unspecified: Secondary | ICD-10-CM | POA: Diagnosis present

## 2017-12-01 DIAGNOSIS — K760 Fatty (change of) liver, not elsewhere classified: Secondary | ICD-10-CM | POA: Diagnosis present

## 2017-12-01 DIAGNOSIS — Y95 Nosocomial condition: Secondary | ICD-10-CM | POA: Diagnosis present

## 2017-12-01 DIAGNOSIS — I5033 Acute on chronic diastolic (congestive) heart failure: Secondary | ICD-10-CM | POA: Diagnosis present

## 2017-12-01 DIAGNOSIS — N951 Menopausal and female climacteric states: Secondary | ICD-10-CM | POA: Diagnosis present

## 2017-12-01 DIAGNOSIS — Z79899 Other long term (current) drug therapy: Secondary | ICD-10-CM

## 2017-12-01 DIAGNOSIS — J180 Bronchopneumonia, unspecified organism: Principal | ICD-10-CM | POA: Diagnosis present

## 2017-12-01 DIAGNOSIS — R0603 Acute respiratory distress: Secondary | ICD-10-CM

## 2017-12-01 DIAGNOSIS — R0781 Pleurodynia: Secondary | ICD-10-CM | POA: Diagnosis present

## 2017-12-01 DIAGNOSIS — G629 Polyneuropathy, unspecified: Secondary | ICD-10-CM

## 2017-12-01 DIAGNOSIS — E1169 Type 2 diabetes mellitus with other specified complication: Secondary | ICD-10-CM | POA: Diagnosis present

## 2017-12-01 DIAGNOSIS — T380X5A Adverse effect of glucocorticoids and synthetic analogues, initial encounter: Secondary | ICD-10-CM | POA: Diagnosis not present

## 2017-12-01 DIAGNOSIS — I5031 Acute diastolic (congestive) heart failure: Secondary | ICD-10-CM | POA: Diagnosis present

## 2017-12-01 DIAGNOSIS — E1151 Type 2 diabetes mellitus with diabetic peripheral angiopathy without gangrene: Secondary | ICD-10-CM | POA: Diagnosis present

## 2017-12-01 DIAGNOSIS — E119 Type 2 diabetes mellitus without complications: Secondary | ICD-10-CM | POA: Diagnosis present

## 2017-12-01 DIAGNOSIS — J181 Lobar pneumonia, unspecified organism: Secondary | ICD-10-CM | POA: Diagnosis not present

## 2017-12-01 DIAGNOSIS — I739 Peripheral vascular disease, unspecified: Secondary | ICD-10-CM | POA: Diagnosis present

## 2017-12-01 DIAGNOSIS — E785 Hyperlipidemia, unspecified: Secondary | ICD-10-CM | POA: Diagnosis present

## 2017-12-01 DIAGNOSIS — E669 Obesity, unspecified: Secondary | ICD-10-CM | POA: Diagnosis present

## 2017-12-01 DIAGNOSIS — E1140 Type 2 diabetes mellitus with diabetic neuropathy, unspecified: Secondary | ICD-10-CM | POA: Diagnosis not present

## 2017-12-01 DIAGNOSIS — I5032 Chronic diastolic (congestive) heart failure: Secondary | ICD-10-CM | POA: Diagnosis present

## 2017-12-01 DIAGNOSIS — Z22322 Carrier or suspected carrier of Methicillin resistant Staphylococcus aureus: Secondary | ICD-10-CM

## 2017-12-01 DIAGNOSIS — J440 Chronic obstructive pulmonary disease with acute lower respiratory infection: Secondary | ICD-10-CM | POA: Diagnosis present

## 2017-12-01 DIAGNOSIS — J441 Chronic obstructive pulmonary disease with (acute) exacerbation: Secondary | ICD-10-CM | POA: Diagnosis present

## 2017-12-01 DIAGNOSIS — K219 Gastro-esophageal reflux disease without esophagitis: Secondary | ICD-10-CM | POA: Diagnosis present

## 2017-12-01 DIAGNOSIS — R402414 Glasgow coma scale score 13-15, 24 hours or more after hospital admission: Secondary | ICD-10-CM | POA: Diagnosis not present

## 2017-12-01 DIAGNOSIS — Z882 Allergy status to sulfonamides status: Secondary | ICD-10-CM

## 2017-12-01 DIAGNOSIS — Z87891 Personal history of nicotine dependence: Secondary | ICD-10-CM

## 2017-12-01 DIAGNOSIS — Z6831 Body mass index (BMI) 31.0-31.9, adult: Secondary | ICD-10-CM

## 2017-12-01 DIAGNOSIS — J9601 Acute respiratory failure with hypoxia: Secondary | ICD-10-CM | POA: Diagnosis present

## 2017-12-01 DIAGNOSIS — E559 Vitamin D deficiency, unspecified: Secondary | ICD-10-CM | POA: Diagnosis present

## 2017-12-01 DIAGNOSIS — Z79890 Hormone replacement therapy (postmenopausal): Secondary | ICD-10-CM | POA: Diagnosis not present

## 2017-12-01 DIAGNOSIS — Z887 Allergy status to serum and vaccine status: Secondary | ICD-10-CM | POA: Diagnosis not present

## 2017-12-01 DIAGNOSIS — E1142 Type 2 diabetes mellitus with diabetic polyneuropathy: Secondary | ICD-10-CM | POA: Diagnosis present

## 2017-12-01 DIAGNOSIS — Z7984 Long term (current) use of oral hypoglycemic drugs: Secondary | ICD-10-CM

## 2017-12-01 DIAGNOSIS — F329 Major depressive disorder, single episode, unspecified: Secondary | ICD-10-CM | POA: Diagnosis present

## 2017-12-01 DIAGNOSIS — Z7982 Long term (current) use of aspirin: Secondary | ICD-10-CM

## 2017-12-01 DIAGNOSIS — J189 Pneumonia, unspecified organism: Secondary | ICD-10-CM | POA: Insufficient documentation

## 2017-12-01 HISTORY — DX: Pneumonia, unspecified organism: J18.9

## 2017-12-01 LAB — URINALYSIS, ROUTINE W REFLEX MICROSCOPIC
Bacteria, UA: NONE SEEN
Bilirubin Urine: NEGATIVE
Glucose, UA: >=500 mg/dL — AB
Hgb urine dipstick: NEGATIVE
Ketones, ur: NEGATIVE mg/dL
Leukocytes, UA: NEGATIVE
Nitrite: NEGATIVE
Protein, ur: NEGATIVE mg/dL
RBC / HPF: NONE SEEN RBC/hpf (ref 0–5)
Specific Gravity, Urine: 1.012 (ref 1.005–1.030)
pH: 6 (ref 5.0–8.0)

## 2017-12-01 LAB — CBC WITH DIFFERENTIAL/PLATELET
Basophils Absolute: 0 10*3/uL (ref 0.0–0.1)
Basophils Relative: 0 %
Eosinophils Absolute: 0.2 10*3/uL (ref 0.0–0.7)
Eosinophils Relative: 2 %
HCT: 40 % (ref 36.0–46.0)
Hemoglobin: 13.3 g/dL (ref 12.0–15.0)
Lymphocytes Relative: 21 %
Lymphs Abs: 2.1 10*3/uL (ref 0.7–4.0)
MCH: 31.4 pg (ref 26.0–34.0)
MCHC: 33.3 g/dL (ref 30.0–36.0)
MCV: 94.6 fL (ref 78.0–100.0)
Monocytes Absolute: 0.4 10*3/uL (ref 0.1–1.0)
Monocytes Relative: 4 %
Neutro Abs: 7.5 10*3/uL (ref 1.7–7.7)
Neutrophils Relative %: 73 %
Platelets: 333 10*3/uL (ref 150–400)
RBC: 4.23 MIL/uL (ref 3.87–5.11)
RDW: 13 % (ref 11.5–15.5)
WBC: 10.2 10*3/uL (ref 4.0–10.5)

## 2017-12-01 LAB — COMPREHENSIVE METABOLIC PANEL
ALT: 31 U/L (ref 14–54)
AST: 33 U/L (ref 15–41)
Albumin: 3.8 g/dL (ref 3.5–5.0)
Alkaline Phosphatase: 76 U/L (ref 38–126)
Anion gap: 12 (ref 5–15)
BUN: 10 mg/dL (ref 6–20)
CO2: 19 mmol/L — ABNORMAL LOW (ref 22–32)
Calcium: 9.3 mg/dL (ref 8.9–10.3)
Chloride: 103 mmol/L (ref 101–111)
Creatinine, Ser: 0.66 mg/dL (ref 0.44–1.00)
GFR calc Af Amer: >60 mL/min (ref >60)
GFR calc non Af Amer: >60 mL/min (ref >60)
Glucose, Bld: 207 mg/dL — ABNORMAL HIGH (ref 65–99)
Potassium: 3.9 mmol/L (ref 3.5–5.1)
Sodium: 134 mmol/L — ABNORMAL LOW (ref 135–145)
Total Bilirubin: 0.4 mg/dL (ref 0.3–1.2)
Total Protein: 6.7 g/dL (ref 6.5–8.1)

## 2017-12-01 LAB — GLUCOSE, CAPILLARY: Glucose-Capillary: 255 mg/dL — ABNORMAL HIGH (ref 65–99)

## 2017-12-01 LAB — HEMOGLOBIN A1C
Hgb A1c MFr Bld: 7.3 % — ABNORMAL HIGH (ref 4.8–5.6)
Mean Plasma Glucose: 162.81 mg/dL

## 2017-12-01 LAB — MAGNESIUM: Magnesium: 1.7 mg/dL (ref 1.7–2.4)

## 2017-12-01 LAB — PROCALCITONIN: Procalcitonin: <0.1 ng/mL

## 2017-12-01 MED ORDER — LEVOTHYROXINE SODIUM 125 MCG PO TABS
125.0000 ug | ORAL_TABLET | Freq: Every day | ORAL | Status: DC
  Administered 2017-12-02 – 2017-12-05 (×4): 125 ug via ORAL
  Filled 2017-12-01 (×4): qty 1

## 2017-12-01 MED ORDER — HYDROCODONE-ACETAMINOPHEN 5-325 MG PO TABS
1.0000 | ORAL_TABLET | ORAL | Status: DC | PRN
  Administered 2017-12-02: 2 via ORAL
  Filled 2017-12-01: qty 2

## 2017-12-01 MED ORDER — INSULIN ASPART 100 UNIT/ML ~~LOC~~ SOLN: 0.0000 [IU] | Freq: Three times a day (TID) | SUBCUTANEOUS | Status: DC

## 2017-12-01 MED ORDER — MONTELUKAST SODIUM 10 MG PO TABS
10.0000 mg | ORAL_TABLET | Freq: Every day | ORAL | Status: DC
  Administered 2017-12-01 – 2017-12-04 (×4): 10 mg via ORAL
  Filled 2017-12-01 (×4): qty 1

## 2017-12-01 MED ORDER — NICOTINE 21 MG/24HR TD PT24
21.0000 mg | MEDICATED_PATCH | Freq: Every day | TRANSDERMAL | Status: DC
Start: 2017-12-01 — End: 2017-12-02
  Filled 2017-12-01 (×2): qty 1

## 2017-12-01 MED ORDER — IOPAMIDOL (ISOVUE-370) INJECTION 76%
100.0000 mL | Freq: Once | INTRAVENOUS | Status: AC | PRN
  Administered 2017-12-01: 100 mL via INTRAVENOUS

## 2017-12-01 MED ORDER — BUDESONIDE 0.5 MG/2ML IN SUSP
0.5000 mg | Freq: Two times a day (BID) | RESPIRATORY_TRACT | Status: DC
  Administered 2017-12-02 – 2017-12-05 (×7): 0.5 mg via RESPIRATORY_TRACT
  Filled 2017-12-01 (×7): qty 2

## 2017-12-01 MED ORDER — LEVOFLOXACIN IN D5W 750 MG/150ML IV SOLN
750.0000 mg | Freq: Once | INTRAVENOUS | Status: AC
  Administered 2017-12-01: 750 mg via INTRAVENOUS
  Filled 2017-12-01: qty 150

## 2017-12-01 MED ORDER — LORATADINE 10 MG PO TABS
10.0000 mg | ORAL_TABLET | Freq: Every day | ORAL | Status: DC
  Administered 2017-12-01 – 2017-12-05 (×5): 10 mg via ORAL
  Filled 2017-12-01 (×5): qty 1

## 2017-12-01 MED ORDER — VITAMIN D 1000 UNITS PO TABS
10000.0000 [IU] | ORAL_TABLET | Freq: Every day | ORAL | Status: DC
  Administered 2017-12-02: 10000 [IU] via ORAL
  Filled 2017-12-01 (×2): qty 10

## 2017-12-01 MED ORDER — IPRATROPIUM BROMIDE 0.02 % IN SOLN
0.5000 mg | RESPIRATORY_TRACT | Status: DC | PRN
  Filled 2017-12-01 (×2): qty 2.5

## 2017-12-01 MED ORDER — ARFORMOTEROL TARTRATE 15 MCG/2ML IN NEBU
15.0000 ug | INHALATION_SOLUTION | Freq: Two times a day (BID) | RESPIRATORY_TRACT | Status: DC
  Administered 2017-12-02 – 2017-12-05 (×7): 15 ug via RESPIRATORY_TRACT
  Filled 2017-12-01 (×7): qty 2

## 2017-12-01 MED ORDER — IPRATROPIUM BROMIDE 0.02 % IN SOLN
0.5000 mg | Freq: Four times a day (QID) | RESPIRATORY_TRACT | Status: DC
  Administered 2017-12-01 – 2017-12-02 (×3): 0.5 mg via RESPIRATORY_TRACT
  Filled 2017-12-01 (×4): qty 2.5

## 2017-12-01 MED ORDER — VANCOMYCIN HCL IN DEXTROSE 1-5 GM/200ML-% IV SOLN
1000.0000 mg | Freq: Two times a day (BID) | INTRAVENOUS | Status: DC
  Administered 2017-12-02 – 2017-12-03 (×3): 1000 mg via INTRAVENOUS
  Filled 2017-12-01 (×3): qty 200

## 2017-12-01 MED ORDER — LEVALBUTEROL HCL 0.63 MG/3ML IN NEBU
0.6300 mg | INHALATION_SOLUTION | RESPIRATORY_TRACT | Status: DC | PRN
  Administered 2017-12-03 (×3): 0.63 mg via RESPIRATORY_TRACT
  Filled 2017-12-01 (×4): qty 3

## 2017-12-01 MED ORDER — CYANOCOBALAMIN 1000 MCG/ML IJ SOLN
1000.0000 ug | Freq: Every day | INTRAMUSCULAR | Status: DC
  Administered 2017-12-02 – 2017-12-05 (×4): 1000 ug via INTRAMUSCULAR
  Filled 2017-12-01 (×5): qty 1

## 2017-12-01 MED ORDER — BISACODYL 10 MG RE SUPP
10.0000 mg | Freq: Every day | RECTAL | Status: DC | PRN
  Administered 2017-12-03: 10 mg via RECTAL
  Filled 2017-12-01: qty 1

## 2017-12-01 MED ORDER — INSULIN ASPART 100 UNIT/ML ~~LOC~~ SOLN
0.0000 [IU] | Freq: Every day | SUBCUTANEOUS | Status: DC
  Administered 2017-12-01: 3 [IU] via SUBCUTANEOUS
  Administered 2017-12-02: 4 [IU] via SUBCUTANEOUS

## 2017-12-01 MED ORDER — ONDANSETRON HCL 4 MG PO TABS: 4.0000 mg | ORAL_TABLET | Freq: Four times a day (QID) | ORAL | Status: DC | PRN

## 2017-12-01 MED ORDER — GUAIFENESIN ER 600 MG PO TB12: 600.0000 mg | ORAL_TABLET | Freq: Two times a day (BID) | ORAL | Status: DC | PRN

## 2017-12-01 MED ORDER — SODIUM CHLORIDE 0.9 % IV SOLN
1.0000 g | Freq: Three times a day (TID) | INTRAVENOUS | Status: DC
  Administered 2017-12-02 – 2017-12-03 (×4): 1 g via INTRAVENOUS
  Filled 2017-12-01 (×5): qty 1

## 2017-12-01 MED ORDER — INSULIN ASPART 100 UNIT/ML ~~LOC~~ SOLN
0.0000 [IU] | Freq: Three times a day (TID) | SUBCUTANEOUS | Status: DC
  Administered 2017-12-02: 11 [IU] via SUBCUTANEOUS
  Administered 2017-12-02: 15 [IU] via SUBCUTANEOUS
  Administered 2017-12-02: 20 [IU] via SUBCUTANEOUS
  Administered 2017-12-03: 11 [IU] via SUBCUTANEOUS
  Administered 2017-12-03 (×2): 7 [IU] via SUBCUTANEOUS
  Administered 2017-12-04: 15 [IU] via SUBCUTANEOUS
  Administered 2017-12-04: 5 [IU] via SUBCUTANEOUS
  Administered 2017-12-04: 7 [IU] via SUBCUTANEOUS
  Administered 2017-12-05: 15 [IU] via SUBCUTANEOUS
  Administered 2017-12-05: 7 [IU] via SUBCUTANEOUS

## 2017-12-01 MED ORDER — ACETAMINOPHEN 650 MG RE SUPP: 650.0000 mg | Freq: Four times a day (QID) | RECTAL | Status: DC | PRN

## 2017-12-01 MED ORDER — ALBUTEROL SULFATE (2.5 MG/3ML) 0.083% IN NEBU
INHALATION_SOLUTION | RESPIRATORY_TRACT | Status: AC
  Filled 2017-12-01: qty 6

## 2017-12-01 MED ORDER — IPRATROPIUM-ALBUTEROL 0.5-2.5 (3) MG/3ML IN SOLN
3.0000 mL | Freq: Once | RESPIRATORY_TRACT | Status: AC
  Administered 2017-12-01: 3 mL via RESPIRATORY_TRACT
  Filled 2017-12-01: qty 3

## 2017-12-01 MED ORDER — SENNOSIDES-DOCUSATE SODIUM 8.6-50 MG PO TABS
1.0000 | ORAL_TABLET | Freq: Every evening | ORAL | Status: DC | PRN
  Administered 2017-12-02 – 2017-12-03 (×2): 1 via ORAL
  Filled 2017-12-01 (×2): qty 1

## 2017-12-01 MED ORDER — METHYLPREDNISOLONE SODIUM SUCC 125 MG IJ SOLR
60.0000 mg | Freq: Three times a day (TID) | INTRAMUSCULAR | Status: DC
  Administered 2017-12-01 – 2017-12-02 (×3): 60 mg via INTRAVENOUS
  Filled 2017-12-01 (×3): qty 2

## 2017-12-01 MED ORDER — ONDANSETRON HCL 4 MG/2ML IJ SOLN: 4.0000 mg | Freq: Four times a day (QID) | INTRAMUSCULAR | Status: DC | PRN

## 2017-12-01 MED ORDER — LEVALBUTEROL HCL 0.63 MG/3ML IN NEBU
0.6300 mg | INHALATION_SOLUTION | Freq: Four times a day (QID) | RESPIRATORY_TRACT | Status: DC
  Administered 2017-12-01 – 2017-12-02 (×3): 0.63 mg via RESPIRATORY_TRACT
  Filled 2017-12-01 (×2): qty 3

## 2017-12-01 MED ORDER — PREGABALIN 25 MG PO CAPS
75.0000 mg | ORAL_CAPSULE | Freq: Three times a day (TID) | ORAL | Status: DC
  Administered 2017-12-01 – 2017-12-05 (×11): 75 mg via ORAL
  Filled 2017-12-01 (×11): qty 3

## 2017-12-01 MED ORDER — PANTOPRAZOLE SODIUM 40 MG PO TBEC: 40.0000 mg | DELAYED_RELEASE_TABLET | Freq: Every day | ORAL | Status: DC

## 2017-12-01 MED ORDER — BUPROPION HCL ER (XL) 150 MG PO TB24
150.0000 mg | ORAL_TABLET | ORAL | Status: DC
  Administered 2017-12-02 – 2017-12-05 (×4): 150 mg via ORAL
  Filled 2017-12-01 (×4): qty 1

## 2017-12-01 MED ORDER — ASPIRIN EC 81 MG PO TBEC
81.0000 mg | DELAYED_RELEASE_TABLET | Freq: Every day | ORAL | Status: DC
  Administered 2017-12-02 – 2017-12-05 (×4): 81 mg via ORAL
  Filled 2017-12-01 (×4): qty 1

## 2017-12-01 MED ORDER — IOPAMIDOL (ISOVUE-370) INJECTION 76%
INTRAVENOUS | Status: AC
  Filled 2017-12-01: qty 100

## 2017-12-01 MED ORDER — KETOROLAC TROMETHAMINE 30 MG/ML IJ SOLN
30.0000 mg | Freq: Four times a day (QID) | INTRAMUSCULAR | Status: DC | PRN
  Administered 2017-12-01 – 2017-12-02 (×2): 30 mg via INTRAVENOUS
  Filled 2017-12-01 (×2): qty 1

## 2017-12-01 MED ORDER — FLUTICASONE PROPIONATE 50 MCG/ACT NA SUSP
1.0000 | Freq: Every day | NASAL | Status: DC
  Administered 2017-12-02 – 2017-12-05 (×4): 1 via NASAL
  Filled 2017-12-01: qty 16

## 2017-12-01 MED ORDER — HYDROCODONE-ACETAMINOPHEN 5-325 MG PO TABS
1.0000 | ORAL_TABLET | Freq: Once | ORAL | Status: AC
  Administered 2017-12-01: 1 via ORAL
  Filled 2017-12-01: qty 1

## 2017-12-01 MED ORDER — IBUPROFEN 800 MG PO TABS
800.0000 mg | ORAL_TABLET | ORAL | Status: DC | PRN
  Administered 2017-12-04 (×2): 800 mg via ORAL
  Filled 2017-12-01: qty 1
  Filled 2017-12-01: qty 2
  Filled 2017-12-01 (×2): qty 1
  Filled 2017-12-01: qty 2

## 2017-12-01 MED ORDER — FAMOTIDINE IN NACL 20-0.9 MG/50ML-% IV SOLN
20.0000 mg | Freq: Two times a day (BID) | INTRAVENOUS | Status: DC
  Administered 2017-12-01 – 2017-12-03 (×5): 20 mg via INTRAVENOUS
  Filled 2017-12-01 (×6): qty 50

## 2017-12-01 MED ORDER — LEVALBUTEROL HCL 0.63 MG/3ML IN NEBU: 0.6300 mg | INHALATION_SOLUTION | Freq: Four times a day (QID) | RESPIRATORY_TRACT | Status: DC | PRN

## 2017-12-01 MED ORDER — AZITHROMYCIN 500 MG IV SOLR
500.0000 mg | INTRAVENOUS | Status: DC
  Administered 2017-12-02 – 2017-12-03 (×2): 500 mg via INTRAVENOUS
  Filled 2017-12-01 (×3): qty 500

## 2017-12-01 MED ORDER — SODIUM CHLORIDE 0.9 % IV SOLN
2.0000 g | Freq: Once | INTRAVENOUS | Status: AC
  Administered 2017-12-01: 2 g via INTRAVENOUS
  Filled 2017-12-01: qty 2

## 2017-12-01 MED ORDER — ACETAMINOPHEN 325 MG PO TABS: 650.0000 mg | ORAL_TABLET | Freq: Four times a day (QID) | ORAL | Status: DC | PRN

## 2017-12-01 MED ORDER — ALBUTEROL (5 MG/ML) CONTINUOUS INHALATION SOLN: 10.0000 mg/h | INHALATION_SOLUTION | RESPIRATORY_TRACT | Status: DC

## 2017-12-01 MED ORDER — ENOXAPARIN SODIUM 40 MG/0.4ML ~~LOC~~ SOLN
40.0000 mg | SUBCUTANEOUS | Status: DC
  Administered 2017-12-01 – 2017-12-04 (×4): 40 mg via SUBCUTANEOUS
  Filled 2017-12-01 (×4): qty 0.4

## 2017-12-01 MED ORDER — VANCOMYCIN HCL 10 G IV SOLR
1500.0000 mg | Freq: Once | INTRAVENOUS | Status: AC
  Administered 2017-12-01: 1500 mg via INTRAVENOUS
  Filled 2017-12-01: qty 1500

## 2017-12-01 MED ORDER — GUAIFENESIN ER 600 MG PO TB12
1200.0000 mg | ORAL_TABLET | Freq: Two times a day (BID) | ORAL | Status: DC | PRN
Start: 2017-12-01 — End: 2017-12-05
  Administered 2017-12-04: 1200 mg via ORAL
  Filled 2017-12-01: qty 2

## 2017-12-01 MED ORDER — IOPAMIDOL (ISOVUE-300) INJECTION 61%: 100.0000 mL | Freq: Once | INTRAVENOUS | Status: DC | PRN

## 2017-12-01 MED ORDER — ALBUTEROL SULFATE (2.5 MG/3ML) 0.083% IN NEBU
5.0000 mg | INHALATION_SOLUTION | Freq: Once | RESPIRATORY_TRACT | Status: AC
  Administered 2017-12-01: 5 mg via RESPIRATORY_TRACT

## 2017-12-01 MED ORDER — BUTALBITAL-APAP-CAFFEINE 50-325-40 MG PO TABS: 1.0000 | ORAL_TABLET | Freq: Four times a day (QID) | ORAL | Status: DC | PRN

## 2017-12-01 MED ORDER — FENOFIBRATE 54 MG PO TABS
54.0000 mg | ORAL_TABLET | Freq: Every day | ORAL | Status: DC
  Administered 2017-12-02 – 2017-12-04 (×3): 54 mg via ORAL
  Filled 2017-12-01 (×4): qty 1

## 2017-12-01 MED ORDER — IPRATROPIUM BROMIDE 0.02 % IN SOLN: 0.5000 mg | RESPIRATORY_TRACT | Status: DC | PRN

## 2017-12-01 NOTE — ED Notes (Signed)
Patient transported to CT 

## 2017-12-01 NOTE — H&P (Signed)
History and Physical    Caitlin Barnett VHQ:[id number] DOB: 09-Jun-1966 DOA: 12/01/2017   PCP: [name], MD   Patient coming from:  Home    Chief Complaint: Shortness of breath   HPI: Caitlin Barnett is a 52 y.o. female with medical history significant for diabetes, hypothyroidism, restless leg leg syndrome, peripheral neuropathy, migraines, seasonal allergies, presented to the emergency department with 2-week history of productive cough, increasing shortness of breath, and subjective fevers.  The patient has failed 2 rounds of antibiotics, one with Zithromax, and another one with Augmentin an outpatient, without resolution or even improvement of her symptoms.  The patient reports that never had a chest rate until today.  She may have had sick contacts.  She denies any recent long distance trip.  Her shortness of breath becomes worse with exertion.  She denies any chest pain, or palpitations.  She denies any headache, blurred vision, neck pain, weakness, numbness, dizziness, peripheral edema, or calf pain.  She denies any abdominal pain, has mild left lower thoracic pain she attributes this to coughing.  Denies any hematemesis, or other bleeding issues.  She denies any syncope or presyncope.  Denies any history of PE or DVT, but she is on hormonal replacement for menopause.     ED Course:  BP 110/71 (BP Location: Right Arm)   Pulse (!) 106   Temp 98.4 F (36.9 C) (Oral)   Resp 18   SpO2 98%   At the ED, a chest x-ray revealed bilateral pneumonia, left greater than right. She also was given nebulizer treatment with DuoNeb, and albuterol.  She began to experience tachycardia, initially felt to be due to albuterol, but even after its administration, she continues to complain of shortness of breath even at rest. CT angios the chest is pending. Patient has been placed on Levaquin for the treatment of pneumonia.  As mentioned above, no prior history of PE or DVT. Last 2D echo was seen April 2019,  showing EF 60-65%, with grade 1 diastolic dysfunction, but normal systolic.  nuclear test was performed on 11/27/2017, showing EF 73%, no ST segment deviation during stress test, low risk study  Bicarb 19, glucose 207, with anion gap of 12 Liver function tests are normal Creatinine 0.66 CBC is normal HemoGlobin A1c 7.3 October 21, 2017  Review of Systems:  As per HPI otherwise all other systems reviewed and are negative  Past Medical History:  Diagnosis Date  . Diabetes mellitus without complication (HCC)   . Diverticulitis   . GERD (gastroesophageal reflux disease)   . Hyperlipidemia   . IBS (irritable bowel syndrome)   . Migraine   . Thyroid disease   . UTI (urinary tract infection)     Past Surgical History:  Procedure Laterality Date  . CESAREAN SECTION  1993  . cystoscopies     multiple  . FUNCTIONAL ENDOSCOPIC SINUS SURGERY  2003  . LAPAROSCOPIC OVARIAN CYSTECTOMY  1983  . TONSILLECTOMY  1973  . TYMPANOPLASTY W/ MASTOIDECTOMY Left 2016 & 2017    Social History Social History   Socioeconomic History  . Marital status: Married    Spouse name: Not on file  . Number of children: Not on file  . Years of education: Not on file  . Highest education level: Not on file  Occupational History  . Not on file  Social Needs  . Financial resource strain: Not on file  . Food insecurity:    Worry: Not on file    Inability: Not  on file  . Transportation needs:    Medical: Not on file    Non-medical: Not on file  Tobacco Use  . Smoking status: Former Smoker    Packs/day: 0.50    Years: 30.00    Pack years: 15.00    Types: Cigarettes    Last attempt to quit: 08/24/2017    Years since quitting: 0.2  . Smokeless tobacco: Never Used  Substance and Sexual Activity  . Alcohol use: No  . Drug use: No  . Sexual activity: Not on file  Lifestyle  . Physical activity:    Days per week: Not on file    Minutes per session: Not on file  . Stress: Not on file  Relationships  .  Social connections:    Talks on phone: Not on file    Gets together: Not on file    Attends religious service: Not on file    Active member of club or organization: Not on file    Attends meetings of clubs or organizations: Not on file    Relationship status: Not on file  . Intimate partner violence:    Fear of current or ex partner: Not on file    Emotionally abused: Not on file    Physically abused: Not on file    Forced sexual activity: Not on file  Other Topics Concern  . Not on file  Social History Narrative  . Not on file     Allergies  Allergen Reactions  . Tetanus Toxoids Other (See Comments)    Injection site abcess  . Sulfa Antibiotics Hives    itching    Family History  Problem Relation Age of Onset  . Mental illness Mother        depression  . Suicidality Mother   . Alcohol abuse Father   . Kidney disease Father   . Mental illness Father        depression  . Suicidality Father   . Breast cancer Maternal Aunt        breast cancer  . Heart disease Maternal Grandmother   . Breast cancer Maternal Grandmother   . Suicidality Maternal Grandfather   . Stroke Paternal Grandmother   . Heart disease Paternal Grandmother   . Heart disease Paternal Grandfather       Prior to Admission medications   Medication Sig Start Date End Date Taking? Authorizing Provider  albuterol (PROVENTIL HFA;VENTOLIN HFA) 108 (90 Base) MCG/ACT inhaler INHALE 2 PUFFS INTO THE LUNGS EVERY 4 HOURS AS NEEDED FOR WHEEZING OR SHORTNESS OF BREATH. 10/14/17  Yes Judd Gaudier, NP  aspirin EC 81 MG tablet Take 81 mg by mouth daily.   Yes [provider]  buPROPion (WELLBUTRIN XL) 150 MG 24 hr tablet Take 1 tablet (150 mg total) by mouth every morning. 10/21/17 10/21/18 Yes Corbett, Morrie Sheldon, NP  Butalbital-APAP-Caffeine (FIORICET) 50-300-40 MG CAPS Take 1 tablet by mouth 4 (four) times daily as needed. migrains 05/27/17  Yes Nche, Bonna Gains, NP  cetirizine (ZYRTEC) 10 MG tablet Take  10 mg by mouth daily.   Yes [provider]  Cholecalciferol 10000 units CAPS Take 1 tablet by mouth daily.   Yes [provider]  Cinnamon 500 MG TABS Take 2 capsules by mouth 2 (two) times daily.   Yes [provider]  cyanocobalamin (,VITAMIN B-12,) 1000 MCG/ML injection Inject 1,000 mcg into the muscle. 01/16/17  Yes [provider]  estrogen, conjugated,-medroxyprogesterone (PREMPRO) 0.45-1.5 MG tablet Take 1 tablet daily 07/22/17  Yes [name], MD  fenofibrate (TRICOR) 145 MG tablet Take 1 tablet (145 mg total) by mouth daily. 10/22/17 10/22/18 Yes Judd Gaudier, NP  fluticasone (FLONASE) 50 MCG/ACT nasal spray Place 1 spray into both nostrils daily. 08/06/17  Yes Judd Gaudier, NP  ibuprofen (ADVIL,MOTRIN) 200 MG tablet Take 800 mg by mouth as needed for moderate pain.   Yes [provider]  levothyroxine (SYNTHROID, LEVOTHROID) 125 MCG tablet Take 1 tablet every morning on an empty stomach with only water for 30 minutes 07/22/17 01/20/18 Yes [name], MD  Magnesium 250 MG TABS Take 250 mg by mouth daily.   Yes [provider]  metFORMIN (GLUCOPHAGE XR) 500 MG 24 hr tablet Take 2 tablets 2 x / day for Diabetes 07/22/17  Yes [name], MD  montelukast (SINGULAIR) 10 MG tablet Take 1 tablet daily for Allergies 07/22/17 12/01/17 Yes [name], MD  omeprazole (PRILOSEC) 40 MG capsule Take 1 capsule daily for acid indigestion 07/22/17 01/20/18 Yes [name], MD  pregabalin (LYRICA) 75 MG capsule Take 1 capsule 3 x / day for neuropathy pains 07/22/17 01/20/18 Yes [name], MD    Physical Exam:  Vitals:   12/01/17 0901 12/01/17 0903 12/01/17 1251 12/01/17 1607  BP: 116/75  105/72 110/71  Pulse: (!) 102  (!) 105 (!) 106  Resp: 18  18 18   Temp: 98 F (36.7 C)  98.4 F (36.9 C)   TempSrc: Oral  Oral   SpO2: 98% 98% 96% 98%   Constitutional: Moderate degree of discomfort due to shortness of breath.    Eyes: PERRL, lids and conjunctivae normal ENMT: Mucous membranes are moist, without exudate or lesions  Neck: normal, supple, no masses, no thyromegaly Respiratory: No accessory muscle use, however the patient does have rhonchi, wheezing bilaterally especially on the left, with decreased breath sounds on the left base. Cardiovascular: Regular rate and rhythm,  murmur, rubs or gallops. No extremity edema. 2+ pedal pulses. No carotid bruits.  Abdomen: Soft, moderately obese non tender, No hepatosplenomegaly. Bowel sounds positive.  Musculoskeletal: no clubbing / cyanosis. Moves all extremities Skin: no jaundice, No lesions.  Neurologic: Sensation intact  Strength equal in all extremities Psychiatric:   Alert and oriented x 3.  Somewhat anxious    Labs on Admission: I have personally reviewed following labs and imaging studies  CBC: Recent Labs  Lab 12/01/17 0909  WBC 10.2  NEUTROABS 7.5  HGB 13.3  HCT 40.0  MCV 94.6  PLT 333    Basic Metabolic Panel: Recent Labs  Lab 12/01/17 0909  NA 134*  K 3.9  CL 103  CO2 19*  GLUCOSE 207*  BUN 10  CREATININE 0.66  CALCIUM 9.3    GFR: Estimated Creatinine Clearance: 81.7 mL/min (by C-G formula based on SCr of 0.66 mg/dL).  Liver Function Tests: Recent Labs  Lab 12/01/17 0909  AST 33  ALT 31  ALKPHOS 76  BILITOT 0.4  PROT 6.7  ALBUMIN 3.8   No results for input(s): LIPASE, AMYLASE in the last 168 hours. No results for input(s): AMMONIA in the last 168 hours.  Coagulation Profile: No results for input(s): INR, PROTIME in the last 168 hours.  Cardiac Enzymes: No results for input(s): CKTOTAL, CKMB, CKMBINDEX, TROPONINI in the last 168 hours.  BNP (last 3 results) No results for input(s): PROBNP in the last 8760 hours.  HbA1C: No results for input(s): HGBA1C in the last 72 hours.  CBG: No results for input(s): GLUCAP in the last 168  hours.  Lipid Profile: No results for input(s): CHOL, HDL, LDLCALC, TRIG,  CHOLHDL, LDLDIRECT in the last 72 hours.  Thyroid Function Tests: No results for input(s): TSH, T4TOTAL, FREET4, T3FREE, THYROIDAB in the last 72 hours.  Anemia Panel: No results for input(s): VITAMINB12, FOLATE, FERRITIN, TIBC, IRON, RETICCTPCT in the last 72 hours.  Urine analysis:    Component Value Date/Time   COLORURINE YELLOW 07/22/2017 1529   APPEARANCEUR CLOUDY (A) 07/22/2017 1529   LABSPEC 1.006 07/22/2017 1529   PHURINE 5.5 07/22/2017 1529   GLUCOSEU NEGATIVE 07/22/2017 1529   HGBUR NEGATIVE 07/22/2017 1529   KETONESUR NEGATIVE 07/22/2017 1529   PROTEINUR NEGATIVE 07/22/2017 1529   NITRITE NEGATIVE 07/22/2017 1529   LEUKOCYTESUR NEGATIVE 07/22/2017 1529    Sepsis Labs: @LABRCNTIP (procalcitonin:4,lacticidven:4) )No results found for this or any previous visit (from the past 240 hour(s)).   Radiological Exams on Admission: Dg Chest 2 View  Result Date: 12/01/2017 CLINICAL DATA:  Shortness of breath and cough. Fevers. Symptoms for 2 weeks. EXAM: CHEST - 2 VIEW COMPARISON:  None. FINDINGS: Streaky opacity at the left base, lower lobe based on the lateral. No effusion or edema. Normal heart size. Thoracic dextroscoliosis. IMPRESSION: Left lower lobe bronchopneumonia and/or atelectasis. Followup PA and lateral chest X-ray is recommended in 3-4 weeks to ensure resolution. Electronically Signed   By: Marnee SpringJonathon  Watts M.D.   On: 12/01/2017 09:51    EKG: Independently reviewed.  Assessment/Plan Principal Problem:   PNA (pneumonia) Active Problems:   Hypothyroidism   Type 2 diabetes mellitus with diabetic neuropathy, without long-term current use of insulin (HCC)   Vitamin D deficiency   Postmenopausal   Vitamin B12 deficiency   Peripheral neuropathy   Hyperlipidemia   GERD (gastroesophageal reflux disease)   Claudication in peripheral vascular disease (HCC)   Acute Respiratory Failure with Hypoxia likely due to HCAP, Rule out  PE PSI 70, Class 2 low risk for mortality   Presenting now with ongoing / progressive symptoms including productive cough,subjective fever, wheezing.  Patient failed 2 courses of antibiotics as an outpatient. CXR today suggests lateral pneumonia, left greater than right.  CT Angioof the chest is pending.  Bicarb is 21.  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 Admit to telemetry observation Oxygen   sputum cultures  IV antibiotics with per protocol with vancomycin and cefepime Procalcitonin,Strep pneumo urine antigen, Legionella urine antigen Nebulizers as needed, with Atrovent nebulizer 0.5 mg every 4 hours as needed, Xopenex nebulizer every 6 hours, Pulmicort 0.5 mg twice daily, Brovana nightly Mucinex prn  antipyretics. Repeat CBC in am Check magnesium Await for CT angiogram of the chest results Solumedrol  60 mg IV 3 times daily Pepcid IV 20 mg every 12 hours Because of her history of hormonal replacement due to menopause, will hold this medication, until PE is ruled out.  Recent history of tobacco abuse  Patient has quit in January of this year, will place her in nicotine patch 21 mg daily every 4 hours   Type II Diabetes Current blood sugar level is 205  Lab Results  Component Value Date   HGBA1C 7.3 (H) 10/21/2017  Hold home oral diabetic medications.   SSI  Hypothyroidism Continue Synthroid Check TSH  Vitamin D deficiency, no acute issues.  Continue vitamin D supplementation  Peripheral neuropathy, continue Lyrica   B12 deficiency, continue as an outpatient with her monthly injections.  No acute issues at this time.  History of migraines, continue butalbital, with APAP, with caffeine  GERD, no acute symptoms Continue PPI    DVT prophylaxis:  Lovenox Code Status:    Full  Family Communication:  Discussed with patient Disposition Plan: Expect patient to be discharged to home after  condition improves Consults called:    None Admission status: Tele Obs   Marlowe Kays, PA-C [facility]   Amion text  [phone]   12/01/2017, 5:46 PM

## 2017-12-01 NOTE — ED Provider Notes (Addendum)
[facility] EMERGENCY DEPARTMENT Provider Note   CSN: [id number] Arrival date & time: 12/01/17  4098     History   Chief Complaint Chief Complaint  Patient presents with  . Shortness of Breath    HPI Caitlin Barnett is a 52 y.o. female.  HPI Patient presents to the emergency department with productive cough for the last 2 weeks with fevers.  The patient states that yesterday she noted some shortness of breath as well.  Patient states that she has been on several rounds of antibiotics over the last few months for upper respiratory symptoms.  Patient states she is never had a chest x-ray until today.  The patient states nothing seems to make the condition better patient does states that exertion made shortness of breath worse.  The patient denies chest pain,  headache,blurred vision, neck pain, weakness, numbness, dizziness, anorexia, edema, abdominal pain, nausea, vomiting, diarrhea, rash, back pain, dysuria, hematemesis, bloody stool, near syncope, or syncope.  Recently completed a course of Zithromax.  Past Medical History:  Diagnosis Date  . Diabetes mellitus without complication (HCC)   . Diverticulitis   . GERD (gastroesophageal reflux disease)   . Hyperlipidemia   . IBS (irritable bowel syndrome)   . Migraine   . Thyroid disease   . UTI (urinary tract infection)     Patient Active Problem List   Diagnosis Date Noted  . Palpitations 11/07/2017  . Chest pain 11/07/2017  . Dyspnea on exertion 11/07/2017  . Bilateral lower extremity edema 11/07/2017  . Claudication in peripheral vascular disease (HCC) 11/07/2017  . Hyperlipidemia 10/20/2017  . GERD (gastroesophageal reflux disease) 10/20/2017  . Obesity (BMI 30.0-34.9) 10/20/2017  . Arthralgia 02/24/2017  . Hypothyroidism 01/16/2017  . Type 2 diabetes mellitus with diabetic neuropathy, without long-term current use of insulin (HCC) 01/16/2017  . Vitamin D deficiency 01/16/2017  . Postmenopausal  01/16/2017  . Hot flashes due to menopause 01/16/2017  . Vitamin B12 deficiency 01/16/2017  . Interstitial cystitis 01/16/2017  . Peripheral neuropathy 01/16/2017    Past Surgical History:  Procedure Laterality Date  . CESAREAN SECTION  1993  . cystoscopies     multiple  . FUNCTIONAL ENDOSCOPIC SINUS SURGERY  2003  . LAPAROSCOPIC OVARIAN CYSTECTOMY  1983  . TONSILLECTOMY  1973  . TYMPANOPLASTY W/ MASTOIDECTOMY Left 2016 & 2017     OB History   None      Home Medications    Prior to Admission medications   Medication Sig Start Date End Date Taking? Authorizing Provider  albuterol (PROVENTIL HFA;VENTOLIN HFA) 108 (90 Base) MCG/ACT inhaler INHALE 2 PUFFS INTO THE LUNGS EVERY 4 HOURS AS NEEDED FOR WHEEZING OR SHORTNESS OF BREATH. 10/14/17  Yes Judd Gaudier, NP  aspirin EC 81 MG tablet Take 81 mg by mouth daily.   Yes [provider]  buPROPion (WELLBUTRIN XL) 150 MG 24 hr tablet Take 1 tablet (150 mg total) by mouth every morning. 10/21/17 10/21/18 Yes Corbett, Morrie Sheldon, NP  Butalbital-APAP-Caffeine (FIORICET) 50-300-40 MG CAPS Take 1 tablet by mouth 4 (four) times daily as needed. migrains 05/27/17  Yes Nche, Bonna Gains, NP  cetirizine (ZYRTEC) 10 MG tablet Take 10 mg by mouth daily.   Yes [provider]  Cholecalciferol 10000 units CAPS Take 1 tablet by mouth daily.   Yes [provider]  Cinnamon 500 MG TABS Take 2 capsules by mouth 2 (two) times daily.   Yes [provider]  cyanocobalamin (,VITAMIN B-12,) 1000 MCG/ML injection Inject 1,000  mcg into the muscle. 01/16/17  Yes [provider]  estrogen, conjugated,-medroxyprogesterone (PREMPRO) 0.45-1.5 MG tablet Take 1 tablet daily 07/22/17  Yes [name], MD  fenofibrate (TRICOR) 145 MG tablet Take 1 tablet (145 mg total) by mouth daily. 10/22/17 10/22/18 Yes Judd Gaudier, NP  fluticasone (FLONASE) 50 MCG/ACT nasal spray Place 1 spray into both nostrils daily. 08/06/17  Yes  Judd Gaudier, NP  ibuprofen (ADVIL,MOTRIN) 200 MG tablet Take 800 mg by mouth as needed for moderate pain.   Yes [provider]  levothyroxine (SYNTHROID, LEVOTHROID) 125 MCG tablet Take 1 tablet every morning on an empty stomach with only water for 30 minutes 07/22/17 01/20/18 Yes [name], MD  Magnesium 250 MG TABS Take 250 mg by mouth daily.   Yes [provider]  metFORMIN (GLUCOPHAGE XR) 500 MG 24 hr tablet Take 2 tablets 2 x / day for Diabetes 07/22/17  Yes [name], MD  montelukast (SINGULAIR) 10 MG tablet Take 1 tablet daily for Allergies 07/22/17 12/01/17 Yes [name], MD  omeprazole (PRILOSEC) 40 MG capsule Take 1 capsule daily for acid indigestion 07/22/17 01/20/18 Yes [name], MD  pregabalin (LYRICA) 75 MG capsule Take 1 capsule 3 x / day for neuropathy pains 07/22/17 01/20/18 Yes [name], MD    Family History Family History  Problem Relation Age of Onset  . Mental illness Mother        depression  . Suicidality Mother   . Alcohol abuse Father   . Kidney disease Father   . Mental illness Father        depression  . Suicidality Father   . Breast cancer Maternal Aunt        breast cancer  . Heart disease Maternal Grandmother   . Breast cancer Maternal Grandmother   . Suicidality Maternal Grandfather   . Stroke Paternal Grandmother   . Heart disease Paternal Grandmother   . Heart disease Paternal Grandfather     Social History Social History   Tobacco Use  . Smoking status: Former Smoker    Packs/day: 0.50    Years: 30.00    Pack years: 15.00    Types: Cigarettes    Last attempt to quit: 08/24/2017    Years since quitting: 0.2  . Smokeless tobacco: Never Used  Substance Use Topics  . Alcohol use: No  . Drug use: No     Allergies   Tetanus toxoids and Sulfa antibiotics   Review of Systems Review of Systems All other systems negative except as documented in the HPI. All pertinent positives and  negatives as reviewed in the HPI.  Physical Exam Updated Vital Signs BP 105/72 (BP Location: Right Arm)   Pulse (!) 105   Temp 98.4 F (36.9 C) (Oral)   Resp 18   SpO2 96%   Physical Exam  Constitutional: She is oriented to person, place, and time. She appears well-developed and well-nourished. No distress.  HENT:  Head: Normocephalic and atraumatic.  Mouth/Throat: Oropharynx is clear and moist.  Eyes: Pupils are equal, round, and reactive to light.  Neck: Normal range of motion. Neck supple.  Cardiovascular: Normal rate, regular rhythm and normal heart sounds. Exam reveals no gallop and no friction rub.  No murmur heard. Pulmonary/Chest: Effort normal. No respiratory distress. She has wheezes. She has no rhonchi. She has no rales.  Abdominal: Soft. Bowel sounds are normal. She exhibits no distension. There is no tenderness.  Musculoskeletal:       Right lower leg:  She exhibits no edema.  Neurological: She is alert and oriented to person, place, and time. She exhibits normal muscle tone. Coordination normal.  Skin: Skin is warm and dry. Capillary refill takes less than 2 seconds. No rash noted. No erythema.  Psychiatric: She has a normal mood and affect. Her behavior is normal.  Nursing note and vitals reviewed.    ED Treatments / Results  Labs (all labs ordered are listed, but only abnormal results are displayed) Labs Reviewed  COMPREHENSIVE METABOLIC PANEL - Abnormal; Notable for the following components:      Result Value   Sodium 134 (*)    CO2 19 (*)    Glucose, Bld 207 (*)    All other components within normal limits  CBC WITH DIFFERENTIAL/PLATELET    EKG None  Radiology Dg Chest 2 View  Result Date: 12/01/2017 CLINICAL DATA:  Shortness of breath and cough. Fevers. Symptoms for 2 weeks. EXAM: CHEST - 2 VIEW COMPARISON:  None. FINDINGS: Streaky opacity at the left base, lower lobe based on the lateral. No effusion or edema. Normal heart size. Thoracic  dextroscoliosis. IMPRESSION: Left lower lobe bronchopneumonia and/or atelectasis. Followup PA and lateral chest X-ray is recommended in 3-4 weeks to ensure resolution. Electronically Signed   By: Marnee SpringJonathon  Watts M.D.   On: 12/01/2017 09:51    Procedures Procedures (including critical care time)  Medications Ordered in ED Medications  levofloxacin (LEVAQUIN) IVPB 750 mg (750 mg Intravenous New Bag/Given 12/01/17 1501)  HYDROcodone-acetaminophen (NORCO/VICODIN) 5-325 MG per tablet 1 tablet (has no administration in time range)  albuterol (PROVENTIL) (2.5 MG/3ML) 0.083% nebulizer solution 5 mg (5 mg Nebulization Given 12/01/17 0905)  ipratropium-albuterol (DUONEB) 0.5-2.5 (3) MG/3ML nebulizer solution 3 mL (3 mLs Nebulization Given 12/01/17 1459)     Initial Impression / Assessment and Plan / ED Course  I have reviewed the triage vital signs and the nursing notes.  Pertinent labs & imaging results that were available during my care of the patient were reviewed by me and considered in my medical decision making (see chart for details).     Patient does have a left lower lobe pneumonia.  Patient states that she has been on several rounds of Zithromax along with Augmentin.  Patient states that the initial breathing treatment did seem to help somewhat.  She is complaining of the pain due to the coughing.  Patient will be assessed for stability on ambulation with her pulse oximetry.  She appears in no acute respiratory distress at this point.   Final Clinical Impressions(s) / ED Diagnoses   Final diagnoses:  None    ED Discharge Orders    None         Charlestine NightLawyer, Brinleigh Tew, PA-C 12/01/17 1656    Arby BarrettePfeiffer, Marcy, MD 12/08/17 1012

## 2017-12-01 NOTE — Progress Notes (Signed)
Pharmacy Antibiotic Note  Clearence Caitlin Barnett is a 52 y.o. female admitted on 12/01/2017 with SOB concerning for PNA. The patient has been noted to have failed two courses of antibiotics outpatient (Azithromycin and Augmentin). Pharmacy has been consulted for Vancomycin + Cefepime dosing.   Plan: - Vancomycin 1500 mg IV x 1 followed by 1g IV every 12 hours - Cefepime 2g IV x 1 followed by 1g IV every 8 hours - Will continue to follow renal function, culture results, LOT, and antibiotic de-escalation plans     Temp (24hrs), Avg:98.2 F (36.8 C), Min:98 F (36.7 C), Max:98.4 F (36.9 C)  Recent Labs  Lab 12/01/17 0909  WBC 10.2  CREATININE 0.66    Estimated Creatinine Clearance: 81.7 mL/min (by C-G formula based on SCr of 0.66 mg/dL).    Allergies  Allergen Reactions  . Tetanus Toxoids Other (See Comments)    Injection site abcess  . Sulfa Antibiotics Hives    itching    Antimicrobials this admission: Vanc 4/22 >> Cefepime 4/22 >>  Dose adjustments this admission: n/a  Microbiology results: 4/22 RCx >> 4/22 UCx >> 4/22 MRSA PCR >> 4/22 BCx >>  Thank you for allowing pharmacy to be a part of this patient's care.  Georgina PillionElizabeth Chrishauna Mee, PharmD, BCPS Clinical Pharmacist Pager: [phone] Clinical phone for 12/01/2017: [phone] If after 3:30p, please call main pharmacy at: [phone] 12/01/2017 6:33 PM

## 2017-12-01 NOTE — ED Notes (Signed)
Ambulated patient with pulse oximetry. Saturation was 97%-98% the whole time. Hr started at 106 and increased to 122. Patient reports SOB while ambulating

## 2017-12-01 NOTE — ED Triage Notes (Signed)
Pt to ER for evaluation of 2 weeks productive cough with fevers, reports since yesterday has had shortness of breath. Pt tachypnic at this time. O2 sats WNL on RA, expiratory wheezing noted in bilateral lung fields. Pt a/o x4.

## 2017-12-02 ENCOUNTER — Encounter (HOSPITAL_COMMUNITY): Payer: Self-pay | Admitting: General Practice

## 2017-12-02 DIAGNOSIS — J441 Chronic obstructive pulmonary disease with (acute) exacerbation: Secondary | ICD-10-CM | POA: Insufficient documentation

## 2017-12-02 LAB — VITAMIN B12: Vitamin B-12: 194 pg/mL (ref 180–914)

## 2017-12-02 LAB — BASIC METABOLIC PANEL
Anion gap: 9 (ref 5–15)
BUN: 9 mg/dL (ref 6–20)
CO2: 21 mmol/L — ABNORMAL LOW (ref 22–32)
Calcium: 9.3 mg/dL (ref 8.9–10.3)
Chloride: 106 mmol/L (ref 101–111)
Creatinine, Ser: 0.74 mg/dL (ref 0.44–1.00)
GFR calc Af Amer: >60 mL/min (ref >60)
GFR calc non Af Amer: >60 mL/min (ref >60)
Glucose, Bld: 339 mg/dL — ABNORMAL HIGH (ref 65–99)
Potassium: 4.2 mmol/L (ref 3.5–5.1)
Sodium: 136 mmol/L (ref 135–145)

## 2017-12-02 LAB — CBC
HCT: 38.5 % (ref 36.0–46.0)
Hemoglobin: 13.2 g/dL (ref 12.0–15.0)
MCH: 32.9 pg (ref 26.0–34.0)
MCHC: 34.3 g/dL (ref 30.0–36.0)
MCV: 96 fL (ref 78.0–100.0)
Platelets: 322 10*3/uL (ref 150–400)
RBC: 4.01 MIL/uL (ref 3.87–5.11)
RDW: 12.8 % (ref 11.5–15.5)
WBC: 7.9 10*3/uL (ref 4.0–10.5)

## 2017-12-02 LAB — TSH: TSH: 1.383 u[IU]/mL (ref 0.350–4.500)

## 2017-12-02 LAB — GLUCOSE, CAPILLARY
Glucose-Capillary: 354 mg/dL — ABNORMAL HIGH (ref 65–99)
Glucose-Capillary: 285 mg/dL — ABNORMAL HIGH (ref 65–99)
Glucose-Capillary: 324 mg/dL — ABNORMAL HIGH (ref 65–99)
Glucose-Capillary: 311 mg/dL — ABNORMAL HIGH (ref 65–99)

## 2017-12-02 LAB — MRSA PCR SCREENING: MRSA by PCR: POSITIVE — AB

## 2017-12-02 LAB — IRON AND TIBC
Iron: 65 ug/dL (ref 28–170)
Saturation Ratios: 20 % (ref 10.4–31.8)
TIBC: 333 ug/dL (ref 250–450)
UIBC: 268 ug/dL

## 2017-12-02 LAB — APTT: aPTT: 36 seconds (ref 24–36)

## 2017-12-02 LAB — FOLATE: Folate: 13.4 ng/mL (ref >5.9)

## 2017-12-02 LAB — INFLUENZA PANEL BY PCR (TYPE A & B)
Influenza A By PCR: NEGATIVE
Influenza B By PCR: NEGATIVE

## 2017-12-02 LAB — FERRITIN: Ferritin: 108 ng/mL (ref 11–307)

## 2017-12-02 LAB — HIV ANTIBODY (ROUTINE TESTING W REFLEX): HIV Screen 4th Generation wRfx: NONREACTIVE

## 2017-12-02 LAB — PROTIME-INR
INR: 1.06
Prothrombin Time: 13.7 seconds (ref 11.4–15.2)

## 2017-12-02 MED ORDER — SODIUM CHLORIDE 0.9 % IV BOLUS
1000.0000 mL | Freq: Once | INTRAVENOUS | Status: AC
  Administered 2017-12-02: 1000 mL via INTRAVENOUS

## 2017-12-02 MED ORDER — MAGNESIUM SULFATE 4 GM/100ML IV SOLN
4.0000 g | Freq: Once | INTRAVENOUS | Status: AC
  Administered 2017-12-02: 4 g via INTRAVENOUS
  Filled 2017-12-02: qty 100

## 2017-12-02 MED ORDER — INSULIN GLARGINE 100 UNIT/ML ~~LOC~~ SOLN
10.0000 [IU] | Freq: Every day | SUBCUTANEOUS | Status: DC
  Administered 2017-12-02 – 2017-12-03 (×2): 10 [IU] via SUBCUTANEOUS
  Filled 2017-12-02 (×2): qty 0.1

## 2017-12-02 MED ORDER — METHYLPREDNISOLONE SODIUM SUCC 125 MG IJ SOLR
60.0000 mg | Freq: Two times a day (BID) | INTRAMUSCULAR | Status: DC
  Administered 2017-12-03: 60 mg via INTRAVENOUS
  Filled 2017-12-02: qty 2

## 2017-12-02 MED ORDER — SODIUM CHLORIDE 0.9 % IV SOLN
INTRAVENOUS | Status: DC
  Administered 2017-12-02: 1000 mL via INTRAVENOUS

## 2017-12-02 MED ORDER — VITAMIN D 1000 UNITS PO TABS
1000.0000 [IU] | ORAL_TABLET | Freq: Every day | ORAL | Status: DC
  Administered 2017-12-03 – 2017-12-05 (×3): 1000 [IU] via ORAL
  Filled 2017-12-02 (×3): qty 1

## 2017-12-02 MED ORDER — ZOLPIDEM TARTRATE 5 MG PO TABS
5.0000 mg | ORAL_TABLET | Freq: Every evening | ORAL | Status: DC | PRN
  Administered 2017-12-02 – 2017-12-04 (×4): 5 mg via ORAL
  Filled 2017-12-02 (×4): qty 1

## 2017-12-02 MED ORDER — SODIUM CHLORIDE 0.9 % IV SOLN
INTRAVENOUS | Status: DC
  Administered 2017-12-02 – 2017-12-03 (×2): via INTRAVENOUS

## 2017-12-02 MED ORDER — MORPHINE SULFATE 15 MG PO TABS
15.0000 mg | ORAL_TABLET | ORAL | Status: DC | PRN
  Administered 2017-12-02 – 2017-12-03 (×4): 15 mg via ORAL
  Filled 2017-12-02 (×5): qty 1

## 2017-12-02 NOTE — Progress Notes (Signed)
Pt admitted from ED fully alert and oriented, independent with mobility without any assistive device, introduced to the floor and pt care equipment, fall risk assessment done pt encouraged to call when need help , able to demonstrate how to use the call light and phone to call for help when needed, prescribed med given will continue to monitor pt

## 2017-12-02 NOTE — Progress Notes (Signed)
PROGRESS NOTE    Caitlin Barnett  [id number] DOB: 01-08-1966 DOA: 12/01/2017 PCP: Caitlin Barnett, William, MD    Brief Narrative:  Patient is a pleasant 52 year old female history of diabetes, hypothyroidism, peripheral neuropathy on Lyrica, menopausal on Premarin,migraine headaches, prior history of extensive tobacco use quit in January 2019 presented to the ED 2-week history of worsening productive cough, worsening shortness of breath, fevers, pleuritic chest pain.  Patient states has been having auditory symptoms over the past 2-3 months and was treated with 2 rounds of azithromycin and 2 rounds of Augmentin with oral prednisone however with completion of antibiotic treatment symptoms reoccurred.  Patient states shortness of breath worsened over the past 24-48 hrs. with minimal exertion and subsequently presented to the ED.  Patient seen in the ED noted to be significantly short of breath on minimal exertion.  Chest x-ray done revealed a left lower lobe bronchopneumonia.  [facility] were called to admit the patient for further evaluation and management.   Assessment & Plan:   Principal Problem:   Community acquired pneumonia of left lower lobe of lung (HCC) Active Problems:   Hypothyroidism   Type 2 diabetes mellitus with diabetic neuropathy, without long-term current use of insulin (HCC)   Vitamin D deficiency   Postmenopausal   Vitamin B12 deficiency   Peripheral neuropathy   Hyperlipidemia   GERD (gastroesophageal reflux disease)   Claudication in peripheral vascular disease (HCC)   PNA (pneumonia)   Acute respiratory distress   COPD exacerbation (HCC)  Acute respiratory distress with hypoxia secondary to left lower lobe pneumonia rule out PE/mild acute COPD exacerbation Patient presenting with 2-week history of productive cough, worsening shortness of breath, left-sided pleuritic chest pain and wheezing.  Patient with respiratory symptoms over the past few months and treated  with 4 rounds of antibiotics, 2 rounds of Augmentin, 2 rounds of azithromycin and prednisone with no significant clinical improvement.  Patient with a prior history of tobacco abuse.  CT angiogram chest negative for pulmonary embolism, prominent peribronchial thickening in the left lower lobe with patchy tree-in-bud centrilobular nodularity suspicious for aspiration or atypical infection.  Hepatic steatosis.  Prominent mediastinal and left hilar lymph nodes likely reactive.  Urine Legionella antigen in the urine pneumococcus antigen pending.  Blood cultures with no growth to date.  MRSA PCR positive.  Influenza PCR is negative. Patient improving clinically.  Decrease IV Solu-Medrol to every 12 hours.  Continue empiric IV vancomycin and IV cefepime and IV azithromycin.  If blood cultures remain negative tomorrow will discontinue IV vancomycin.  Continue Pulmicort, Brovana, Flonase, Claritin, PPI.  Once patient has been treated may need outpatient follow-up with pulmonary for further evaluation and management.   2.  Type 2 diabetes mellitus  hemoglobin A1c 7.3.  CBGs ranging from 285 -354.  Elevated CBGs likely secondary to IV steroids.  Add Lantus 10 units daily.  Continue sliding scale insulin.   3.  Hypothyroidism TSH is 1.383.  Continue home dose Synthroid.  4.  Vitamin B12 deficiency/low vitamin B12 B12 levels low at 194.  Continue daily vitamin B12 1000 MCG's IM daily and follow.  5.  Gastroesophageal reflux disease PPI.  6.  Peripheral neuropathy Continue home dose Lyrica.  Continue vitamin B12 IM injections.  7.  Borderline blood pressure Systolic blood pressure in the 90s. ??  Etiology.  We will give a bolus of normal saline 1 L x1.  Increase maintenance IV fluids to normal saline at 125 cc/h.  Follow.   DVT prophylaxis: Lovenox  Code Status: Full Family Communication: updated patient.  No family at bedside. Disposition Plan: Home when clinically improved and on oral  antibiotics.   Consultants:   None  Procedures:   CT angiogram chest 12/01/2017  Chest x-ray 12/01/2017  Antimicrobials:  IV vancomycin 12/01/2017  IV cefepime 12/01/2017  IV azithromycin 12/01/2017   Subjective: Patient walking from bathroom.  States shortness of breath and wheezing improving however not at baseline.  States chest pain managed better on IV Toradol than on Vicodin and would like Vicodin discontinued.  Patient states cough is improving.  Objective: Vitals:   12/02/17 0516 12/02/17 0813 12/02/17 1226 12/02/17 1704  BP: 96/70  (!) 89/69 (!) 90/56  Pulse: (!) 108  98   Resp: 16  15   Temp: 97.9 F (36.6 C)  98 F (36.7 C)   TempSrc:      SpO2: 98% 96% 94%   Weight: 68.9 kg (151 lb 14.4 oz)     Height:        Intake/Output Summary (Last 24 hours) at 12/02/2017 1848 Last data filed at 12/02/2017 1844 Gross per 24 hour  Intake 2627 ml  Output 1480 ml  Net 1147 ml   Filed Weights   12/01/17 2227 12/02/17 0516  Weight: 68.9 kg (151 lb 14.4 oz) 68.9 kg (151 lb 14.4 oz)    Examination:  General exam: Appears calm and comfortable  Respiratory system: Lungs with minimal expiratory wheezing.  Less coarse breath sounds.  No crackles.  Normal respiratory effort.   Cardiovascular system: S1 & S2 heard, RRR. No JVD, murmurs, rubs, gallops or clicks. No pedal edema. Gastrointestinal system: Abdomen is nondistended, soft and nontender. No organomegaly or masses felt. Normal bowel sounds heard. Central nervous system: Alert and oriented. No focal neurological deficits. Extremities: Symmetric 5 x 5 power. Skin: No rashes, lesions or ulcers Psychiatry: Judgement and insight appear normal. Mood & affect appropriate.     Data Reviewed: I have personally reviewed following labs and imaging studies  CBC: Recent Labs  Lab 12/01/17 0909 12/02/17 0411  WBC 10.2 7.9  NEUTROABS 7.5  --   HGB 13.3 13.2  HCT 40.0 38.5  MCV 94.6 96.0  PLT 333 322   Basic  Metabolic Panel: Recent Labs  Lab 12/01/17 0909 12/01/17 2013 12/02/17 0411  NA 134*  --  136  K 3.9  --  4.2  CL 103  --  106  CO2 19*  --  21*  GLUCOSE 207*  --  339*  BUN 10  --  9  CREATININE 0.66  --  0.74  CALCIUM 9.3  --  9.3  MG  --  1.7  --    GFR: Estimated Creatinine Clearance: 76.5 mL/min (by C-G formula based on SCr of 0.74 mg/dL). Liver Function Tests: Recent Labs  Lab 12/01/17 0909  AST 33  ALT 31  ALKPHOS 76  BILITOT 0.4  PROT 6.7  ALBUMIN 3.8   No results for input(s): LIPASE, AMYLASE in the last 168 hours. No results for input(s): AMMONIA in the last 168 hours. Coagulation Profile: Recent Labs  Lab 12/02/17 0411  INR 1.06   Cardiac Enzymes: No results for input(s): CKTOTAL, CKMB, CKMBINDEX, TROPONINI in the last 168 hours. BNP (last 3 results) No results for input(s): PROBNP in the last 8760 hours. HbA1C: Recent Labs    12/01/17 2030  HGBA1C 7.3*   CBG: Recent Labs  Lab 12/01/17 2224 12/02/17 0756 12/02/17 1225 12/02/17 1650  GLUCAP 255* 354* 285*  324*   Lipid Profile: No results for input(s): CHOL, HDL, LDLCALC, TRIG, CHOLHDL, LDLDIRECT in the last 72 hours. Thyroid Function Tests: Recent Labs    12/02/17 0411  TSH 1.383   Anemia Panel: Recent Labs    12/02/17 0411  VITAMINB12 194  FOLATE 13.4  FERRITIN 108  TIBC 333  IRON 65   Sepsis Labs: Recent Labs  Lab 12/01/17 2013  PROCALCITON <0.10    Recent Results (from the past 240 hour(s))  Culture, blood (routine x 2) Call MD if unable to obtain prior to antibiotics being given     Status: None (Preliminary result)   Collection Time: 12/01/17  5:42 PM  Result Value Ref Range Status   Specimen Description BLOOD RIGHT WRIST  Final   Special Requests   Final    BOTTLES DRAWN AEROBIC AND ANAEROBIC Blood Culture results may not be optimal due to an excessive volume of blood received in culture bottles   Culture   Final    NO GROWTH < 24 HOURS Performed at [facility], [address], [place], [place] [address]    Report Status PENDING  Incomplete  Culture, blood (routine x 2) Call MD if unable to obtain prior to antibiotics being given     Status: None (Preliminary result)   Collection Time: 12/01/17  5:47 PM  Result Value Ref Range Status   Specimen Description BLOOD RIGHT WRIST  Final   Special Requests   Final    BOTTLES DRAWN AEROBIC AND ANAEROBIC Blood Culture adequate volume   Culture   Final    NO GROWTH < 24 HOURS Performed at [facility], [address], [place], [place] [address]    Report Status PENDING  Incomplete  MRSA PCR Screening     Status: Abnormal   Collection Time: 12/02/17 12:05 AM  Result Value Ref Range Status   MRSA by PCR POSITIVE (A) NEGATIVE Final    Comment:        The GeneXpert MRSA Assay (FDA approved for NASAL specimens only), is one component of a comprehensive MRSA colonization surveillance program. It is not intended to diagnose MRSA infection nor to guide or monitor treatment for MRSA infections. RESULT CALLED TO, READ BACK BY AND VERIFIED WITH: L OSEI RN 12/02/17 0300 JDW Performed at [facility], [address], [place], [place] [address]          Radiology Studies: Dg Chest 2 View  Result Date: 12/01/2017 CLINICAL DATA:  Shortness of breath and cough. Fevers. Symptoms for 2 weeks. EXAM: CHEST - 2 VIEW COMPARISON:  None. FINDINGS: Streaky opacity at the left base, lower lobe based on the lateral. No effusion or edema. Normal heart size. Thoracic dextroscoliosis. IMPRESSION: Left lower lobe bronchopneumonia and/or atelectasis. Followup PA and lateral chest X-ray is recommended in 3-4 weeks to ensure resolution. Electronically Signed   By: Marnee Spring M.D.   On: 12/01/2017 09:51   Ct Angio Chest Pe W/cm &/or Wo Cm  Result Date: 12/01/2017 CLINICAL DATA:  Left-sided chest and back pain with worsening shortness of breath. EXAM: CT ANGIOGRAPHY CHEST WITH CONTRAST TECHNIQUE:  Multidetector CT imaging of the chest was performed using the standard protocol during bolus administration of intravenous contrast. Multiplanar CT image reconstructions and MIPs were obtained to evaluate the vascular anatomy. CONTRAST:  ISOVUE-370 IOPAMIDOL (ISOVUE-370) INJECTION 76% COMPARISON:  Chest x-ray from same day. FINDINGS: Cardiovascular: Satisfactory opacification of the pulmonary arteries to the segmental level. No  evidence of pulmonary embolism. Normal heart size. No pericardial effusion. Normal caliber thoracic aorta. Mediastinum/Nodes: There are a few prominent mediastinal and left hilar lymph nodes measuring up to 12 mm in short axis. No axillary or hilar lymphadenopathy. The thyroid gland, trachea, and esophagus are unremarkable. Lungs/Pleura: Prominent peribronchial thickening in the left lower lobe with patchy tree-in-bud centrilobular nodularity. No pleural effusion or pneumothorax. Mild mosaic attenuation in the lungs. No suspicious pulmonary nodule. Upper Abdomen: No acute abnormality.  Hepatic steatosis. Musculoskeletal: No chest wall abnormality. No acute or significant osseous findings. Review of the MIP images confirms the above findings. IMPRESSION: 1.  No evidence of pulmonary embolism. 2. Prominent peribronchial thickening in the left lower lobe with patchy tree-in-bud centrilobular nodularity, suspicious for aspiration or atypical infection. 3. Mildly prominent mediastinal and left hilar lymph nodes are nonspecific, but favored reactive. 4. Hepatic steatosis. Electronically Signed   By: Obie Dredge M.D.   On: 12/01/2017 18:21        Scheduled Meds: . arformoterol  15 mcg Nebulization BID  . aspirin EC  81 mg Oral Daily  . budesonide (PULMICORT) nebulizer solution  0.5 mg Nebulization BID  . buPROPion  150 mg Oral BH-q7a  . [START ON 12/03/2017] cholecalciferol  1,000 Units Oral Daily  . cyanocobalamin  1,000 mcg Intramuscular Daily  . enoxaparin (LOVENOX)  injection  40 mg Subcutaneous Q24H  . fenofibrate  54 mg Oral Daily  . fluticasone  1 spray Each Nare Daily  . insulin aspart  0-20 Units Subcutaneous TID WC  . insulin aspart  0-5 Units Subcutaneous QHS  . insulin glargine  10 Units Subcutaneous Daily  . levothyroxine  125 mcg Oral QAC breakfast  . loratadine  10 mg Oral Daily  . [START ON 12/03/2017] methylPREDNISolone (SOLU-MEDROL) injection  60 mg Intravenous Q12H  . montelukast  10 mg Oral QHS  . nicotine  21 mg Transdermal Daily  . pregabalin  75 mg Oral TID   Continuous Infusions: . sodium chloride 1,000 mL (12/02/17 0853)  . azithromycin Stopped (12/02/17 1616)  . ceFEPime (MAXIPIME) IV Stopped (12/02/17 1502)  . famotidine (PEPCID) IV Stopped (12/02/17 1034)  . sodium chloride 1,000 mL (12/02/17 1844)  . vancomycin Stopped (12/02/17 0945)     LOS: 1 day    Time spent: 35 minutes    Ramiro Harvest, MD [facility] Pager [phone]  If 7PM-7AM, please contact night-coverage [email or web address] Password [facility] 12/02/2017, 6:48 PM

## 2017-12-03 DIAGNOSIS — I739 Peripheral vascular disease, unspecified: Secondary | ICD-10-CM

## 2017-12-03 LAB — BASIC METABOLIC PANEL
Anion gap: 9 (ref 5–15)
BUN: 7 mg/dL (ref 6–20)
CO2: 21 mmol/L — ABNORMAL LOW (ref 22–32)
Calcium: 8.8 mg/dL — ABNORMAL LOW (ref 8.9–10.3)
Chloride: 110 mmol/L (ref 101–111)
Creatinine, Ser: 0.64 mg/dL (ref 0.44–1.00)
GFR calc Af Amer: >60 mL/min (ref >60)
GFR calc non Af Amer: >60 mL/min (ref >60)
Glucose, Bld: 223 mg/dL — ABNORMAL HIGH (ref 65–99)
Potassium: 4 mmol/L (ref 3.5–5.1)
Sodium: 140 mmol/L (ref 135–145)

## 2017-12-03 LAB — URINE CULTURE: Culture: NO GROWTH

## 2017-12-03 LAB — CBC
HCT: 33.6 % — ABNORMAL LOW (ref 36.0–46.0)
Hemoglobin: 11.1 g/dL — ABNORMAL LOW (ref 12.0–15.0)
MCH: 31.7 pg (ref 26.0–34.0)
MCHC: 33 g/dL (ref 30.0–36.0)
MCV: 96 fL (ref 78.0–100.0)
Platelets: 318 10*3/uL (ref 150–400)
RBC: 3.5 MIL/uL — ABNORMAL LOW (ref 3.87–5.11)
RDW: 13 % (ref 11.5–15.5)
WBC: 12.6 10*3/uL — ABNORMAL HIGH (ref 4.0–10.5)

## 2017-12-03 LAB — GLUCOSE, CAPILLARY
Glucose-Capillary: 224 mg/dL — ABNORMAL HIGH (ref 65–99)
Glucose-Capillary: 266 mg/dL — ABNORMAL HIGH (ref 65–99)
Glucose-Capillary: 232 mg/dL — ABNORMAL HIGH (ref 65–99)
Glucose-Capillary: 272 mg/dL — ABNORMAL HIGH (ref 65–99)

## 2017-12-03 LAB — MAGNESIUM: Magnesium: 2.3 mg/dL (ref 1.7–2.4)

## 2017-12-03 LAB — LEGIONELLA PNEUMOPHILA SEROGP 1 UR AG: L. pneumophila Serogp 1 Ur Ag: NEGATIVE

## 2017-12-03 MED ORDER — INSULIN ASPART 100 UNIT/ML ~~LOC~~ SOLN: 6.0000 [IU] | Freq: Three times a day (TID) | SUBCUTANEOUS | Status: DC

## 2017-12-03 MED ORDER — METHYLPREDNISOLONE SODIUM SUCC 40 MG IJ SOLR
40.0000 mg | Freq: Two times a day (BID) | INTRAMUSCULAR | Status: DC
  Administered 2017-12-03 – 2017-12-04 (×2): 40 mg via INTRAVENOUS
  Filled 2017-12-03 (×3): qty 1

## 2017-12-03 MED ORDER — INSULIN GLARGINE 100 UNIT/ML ~~LOC~~ SOLN
25.0000 [IU] | Freq: Two times a day (BID) | SUBCUTANEOUS | Status: DC
  Filled 2017-12-03: qty 0.25

## 2017-12-03 MED ORDER — INSULIN GLARGINE 100 UNIT/ML ~~LOC~~ SOLN
10.0000 [IU] | Freq: Two times a day (BID) | SUBCUTANEOUS | Status: DC
  Administered 2017-12-03 – 2017-12-04 (×2): 10 [IU] via SUBCUTANEOUS
  Filled 2017-12-03 (×3): qty 0.1

## 2017-12-03 MED ORDER — METHYLPREDNISOLONE SODIUM SUCC 125 MG IJ SOLR: 60.0000 mg | Freq: Every day | INTRAMUSCULAR | Status: DC

## 2017-12-03 MED ORDER — SODIUM CHLORIDE 0.9 % IV SOLN
2.0000 g | INTRAVENOUS | Status: DC
  Administered 2017-12-03: 2 g via INTRAVENOUS
  Filled 2017-12-03 (×2): qty 20

## 2017-12-03 NOTE — Progress Notes (Addendum)
PROGRESS NOTE  Caitlin CheekSusan Munos ZOX:[id number] DOB: Jan 04, 1966 DOA: 12/01/2017 PCP: Lucky CowboyMcKeown, William, MD  HPI/Recap of past 24 hours: Patient is a pleasant 52 year old female history of diabetes, hypothyroidism, peripheral neuropathy on Lyrica, menopausal on Premarin,migraine headaches, prior history of extensive tobacco use quit in January 2019 presented to the ED 2-week history of worsening productive cough, worsening shortness of breath, fevers, pleuritic chest pain. Patient states has been having auditory symptoms over the past 2-3 months and was treated with 2 rounds of azithromycin and 2 rounds of Augmentin with oral prednisone however with completion of antibiotic treatment symptoms reoccurred. Patient states shortness of breath worsened over the past 24-48 hrs. with minimal exertion and subsequently presented to the ED. Patient seen in the ED noted to be significantly short of breath on minimal exertion. Chest x-ray done revealed a left lower lobe bronchopneumonia. [facility] were called to admit the patient for further evaluation and management.  12/03/17: Seen and examined at her bedside.  She denies any dyspnea at rest states she is breathing better.  Cough is improving.   Assessment/Plan: Principal Problem:   Community acquired pneumonia of left lower lobe of lung (HCC) Active Problems:   Hypothyroidism   Type 2 diabetes mellitus with diabetic neuropathy, without long-term current use of insulin (HCC)   Vitamin D deficiency   Postmenopausal   Vitamin B12 deficiency   Peripheral neuropathy   Hyperlipidemia   GERD (gastroesophageal reflux disease)   Claudication in peripheral vascular disease (HCC)   PNA (pneumonia)   Acute respiratory distress   COPD exacerbation (HCC)  Community-acquired pneumonia, POA Improving Continue IV azithromycin, IV cefepime, IV vancomycin MRSA positive- possibly colonizer Wean off IV Solu-Medrol from 60 mg twice daily to 60 mg  daily Continue O2 supplementation to maintain O2 saturation 92% of greater Continue duo nebs twice daily  Type 2 diabetes Last A1c 7.3 on 12/01/17 Continue insulin sliding scale in the hospital Avoid hypoglycemia Resume home medications at discharge  COPD Continue home medications Continue Brovana  Hypothyroidism Continue levothyroxine  Chronic depression/anxiety Continue bupropion  GERD Continue Prilosec    Code Status: Full code  Family Communication: None at bedside  Disposition Plan: Possible discharge tomorrow on oral antibiotics and nebulizer.   Consultants:  None  Procedures: None Antimicrobials:  IV azithromycin, IV cefepime, IV Vancomycin   DVT prophylaxis: Subcu Lovenox   Objective: Vitals:   12/03/17 0455 12/03/17 0621 12/03/17 0733 12/03/17 0735  BP: 109/89     Pulse: (!) 116     Resp: 18     Temp: 97.7 F (36.5 C)     TempSrc:      SpO2: 98%  95% 95%  Weight:  80.8 kg (178 lb 1.6 oz)    Height:        Intake/Output Summary (Last 24 hours) at 12/03/2017 1015 Last data filed at 12/03/2017 [id number] Gross per 24 hour  Intake 3230.42 ml  Output 900 ml  Net 2330.42 ml   Filed Weights   12/01/17 2227 12/02/17 0516 12/03/17 0621  Weight: 68.9 kg (151 lb 14.4 oz) 68.9 kg (151 lb 14.4 oz) 80.8 kg (178 lb 1.6 oz)    Exam:   General: 52 year old Caucasian female with developed well-nourished in no acute distress.  Alert and oriented x3.  Cardiovascular: Rate and rhythm with no rubs or gallops.  No thyromegaly or JVD.  Respiratory: Mild rales at bases.  No wheezes noted.  Abdomen: Soft nontender nondistended with normal bowel sounds x4 quadrant.  Musculoskeletal:  Moves all 4 extremities.  No lower extremity edema.  Skin: No ulcerative lesions.  No rashes.  Psychiatry: Mood is appropriate for condition and setting   Data Reviewed: CBC: Recent Labs  Lab 12/01/17 0909 12/02/17 0411 12/03/17 0243  WBC 10.2 7.9 12.6*  NEUTROABS 7.5   --   --   HGB 13.3 13.2 11.1*  HCT 40.0 38.5 33.6*  MCV 94.6 96.0 96.0  PLT 333 322 318   Basic Metabolic Panel: Recent Labs  Lab 12/01/17 0909 12/01/17 2013 12/02/17 0411 12/03/17 0243  NA 134*  --  136 140  K 3.9  --  4.2 4.0  CL 103  --  106 110  CO2 19*  --  21* 21*  GLUCOSE 207*  --  339* 223*  BUN 10  --  9 7  CREATININE 0.66  --  0.74 0.64  CALCIUM 9.3  --  9.3 8.8*  MG  --  1.7  --  2.3   GFR: Estimated Creatinine Clearance: 82.9 mL/min (by C-G formula based on SCr of 0.64 mg/dL). Liver Function Tests: Recent Labs  Lab 12/01/17 0909  AST 33  ALT 31  ALKPHOS 76  BILITOT 0.4  PROT 6.7  ALBUMIN 3.8   No results for input(s): LIPASE, AMYLASE in the last 168 hours. No results for input(s): AMMONIA in the last 168 hours. Coagulation Profile: Recent Labs  Lab 12/02/17 0411  INR 1.06   Cardiac Enzymes: No results for input(s): CKTOTAL, CKMB, CKMBINDEX, TROPONINI in the last 168 hours. BNP (last 3 results) No results for input(s): PROBNP in the last 8760 hours. HbA1C: Recent Labs    12/01/17 2030  HGBA1C 7.3*   CBG: Recent Labs  Lab 12/02/17 0756 12/02/17 1225 12/02/17 1650 12/02/17 2210 12/03/17 0746  GLUCAP 354* 285* 324* 311* 224*   Lipid Profile: No results for input(s): CHOL, HDL, LDLCALC, TRIG, CHOLHDL, LDLDIRECT in the last 72 hours. Thyroid Function Tests: Recent Labs    12/02/17 0411  TSH 1.383   Anemia Panel: Recent Labs    12/02/17 0411  VITAMINB12 194  FOLATE 13.4  FERRITIN 108  TIBC 333  IRON 65   Urine analysis:    Component Value Date/Time   COLORURINE STRAW (A) 12/01/2017 2329   APPEARANCEUR CLEAR 12/01/2017 2329   LABSPEC 1.012 12/01/2017 2329   PHURINE 6.0 12/01/2017 2329   GLUCOSEU >=500 (A) 12/01/2017 2329   HGBUR NEGATIVE 12/01/2017 2329   BILIRUBINUR NEGATIVE 12/01/2017 2329   KETONESUR NEGATIVE 12/01/2017 2329   PROTEINUR NEGATIVE 12/01/2017 2329   NITRITE NEGATIVE 12/01/2017 2329   LEUKOCYTESUR  NEGATIVE 12/01/2017 2329   Sepsis Labs: @LABRCNTIP (procalcitonin:4,lacticidven:4)  ) Recent Results (from the past 240 hour(s))  Culture, blood (routine x 2) Call MD if unable to obtain prior to antibiotics being given     Status: None (Preliminary result)   Collection Time: 12/01/17  5:42 PM  Result Value Ref Range Status   Specimen Description BLOOD RIGHT WRIST  Final   Special Requests   Final    BOTTLES DRAWN AEROBIC AND ANAEROBIC Blood Culture results may not be optimal due to an excessive volume of blood received in culture bottles   Culture   Final    NO GROWTH < 24 HOURS Performed at [facility], [address] [place], [place] [address]    Report Status PENDING  Incomplete  Culture, blood (routine x 2) Call MD if unable to obtain prior to antibiotics being given     Status:  None (Preliminary result)   Collection Time: 12/01/17  5:47 PM  Result Value Ref Range Status   Specimen Description BLOOD RIGHT WRIST  Final   Special Requests   Final    BOTTLES DRAWN AEROBIC AND ANAEROBIC Blood Culture adequate volume   Culture   Final    NO GROWTH < 24 HOURS Performed at [facility], [address], [place], [place] [address]    Report Status PENDING  Incomplete  Urine Culture     Status: None   Collection Time: 12/01/17  6:18 PM  Result Value Ref Range Status   Specimen Description URINE, RANDOM  Final   Special Requests NONE  Final   Culture   Final    NO GROWTH Performed at [facility], [address] [place], [place] [address]    Report Status 12/03/2017 FINAL  Final  MRSA PCR Screening     Status: Abnormal   Collection Time: 12/02/17 12:05 AM  Result Value Ref Range Status   MRSA by PCR POSITIVE (A) NEGATIVE Final    Comment:        The GeneXpert MRSA Assay (FDA approved for NASAL specimens only), is one component of a comprehensive MRSA colonization surveillance program. It is not intended to diagnose MRSA infection nor to guide  or monitor treatment for MRSA infections. RESULT CALLED TO, READ BACK BY AND VERIFIED WITH: L OSEI RN 12/02/17 0300 JDW Performed at [facility], [address] [place], [place] [address]       Studies: No results found.  Scheduled Meds: . arformoterol  15 mcg Nebulization BID  . aspirin EC  81 mg Oral Daily  . budesonide (PULMICORT) nebulizer solution  0.5 mg Nebulization BID  . buPROPion  150 mg Oral BH-q7a  . cholecalciferol  1,000 Units Oral Daily  . cyanocobalamin  1,000 mcg Intramuscular Daily  . enoxaparin (LOVENOX) injection  40 mg Subcutaneous Q24H  . fenofibrate  54 mg Oral Daily  . fluticasone  1 spray Each Nare Daily  . insulin aspart  0-20 Units Subcutaneous TID WC  . insulin aspart  0-5 Units Subcutaneous QHS  . insulin glargine  10 Units Subcutaneous Daily  . levothyroxine  125 mcg Oral QAC breakfast  . loratadine  10 mg Oral Daily  . [START ON 12/04/2017] methylPREDNISolone (SOLU-MEDROL) injection  60 mg Intravenous Daily  . montelukast  10 mg Oral QHS  . pregabalin  75 mg Oral TID    Continuous Infusions: . azithromycin Stopped (12/02/17 1616)  . ceFEPime (MAXIPIME) IV Stopped (12/03/17 0615)  . famotidine (PEPCID) IV Stopped (12/02/17 2356)  . vancomycin 1,000 mg (12/03/17 0851)     LOS: 2 days     Darlin Drop, MD [facility] Pager [phone]  If 7PM-7AM, please contact night-coverage [email or web address] Password [facility] 12/03/2017, 10:15 AM

## 2017-12-03 NOTE — Progress Notes (Addendum)
Inpatient Diabetes Program Recommendations  AACE/ADA: New Consensus Statement on Inpatient Glycemic Control (2015)  Target Ranges:  Prepandial:   less than 140 mg/dL      Peak postprandial:   less than 180 mg/dL (1-2 hours)      Critically ill patients:  140 - 180 mg/dL   Lab Results  Component Value Date   GLUCAP 224 (H) 12/03/2017   HGBA1C 7.3 (H) 12/01/2017    Review of Glycemic Control Results for Caitlin Barnett, Caitlin Barnett (MRN [id number]) as of 12/03/2017 09:20  Ref. Range 12/02/2017 07:56 12/02/2017 12:25 12/02/2017 16:50 12/02/2017 22:10 12/03/2017 07:46  Glucose-Capillary Latest Ref Range: 65 - 99 mg/dL 742354 (H) 595285 (H) 638324 (H) 311 (H) 224 (H)   Diabetes history: Type 2 DM Outpatient Diabetes medications: Metformin 500 mg BID Current orders for Inpatient glycemic control: Novolog 0-20 units TID, Novolog 0-5 units QHS, Lantus 10 units QD, Solumedrol 60 mg QD  Inpatient Diabetes Program Recommendations:    In the setting of steroids, would consider increasing Lantus 25 units QD as patient has received >50 additional units of short acting insulin in 24 hour period.   Thanks, Lujean RaveLauren Artha Stavros, MSN, RNC-OB Diabetes Coordinator [phone] (8a-5p)

## 2017-12-04 ENCOUNTER — Encounter: Payer: Self-pay | Admitting: Cardiovascular Disease

## 2017-12-04 ENCOUNTER — Encounter: Payer: Self-pay | Admitting: Internal Medicine

## 2017-12-04 DIAGNOSIS — I5033 Acute on chronic diastolic (congestive) heart failure: Secondary | ICD-10-CM

## 2017-12-04 DIAGNOSIS — E669 Obesity, unspecified: Secondary | ICD-10-CM

## 2017-12-04 DIAGNOSIS — E039 Hypothyroidism, unspecified: Secondary | ICD-10-CM

## 2017-12-04 DIAGNOSIS — E1140 Type 2 diabetes mellitus with diabetic neuropathy, unspecified: Secondary | ICD-10-CM

## 2017-12-04 LAB — BASIC METABOLIC PANEL
Anion gap: 9 (ref 5–15)
BUN: 12 mg/dL (ref 6–20)
CO2: 24 mmol/L (ref 22–32)
Calcium: 9.2 mg/dL (ref 8.9–10.3)
Chloride: 104 mmol/L (ref 101–111)
Creatinine, Ser: 0.69 mg/dL (ref 0.44–1.00)
GFR calc Af Amer: >60 mL/min (ref >60)
GFR calc non Af Amer: >60 mL/min (ref >60)
Glucose, Bld: 213 mg/dL — ABNORMAL HIGH (ref 65–99)
Potassium: 3.9 mmol/L (ref 3.5–5.1)
Sodium: 137 mmol/L (ref 135–145)

## 2017-12-04 LAB — CBC
HCT: 34.8 % — ABNORMAL LOW (ref 36.0–46.0)
Hemoglobin: 11 g/dL — ABNORMAL LOW (ref 12.0–15.0)
MCH: 30.6 pg (ref 26.0–34.0)
MCHC: 31.6 g/dL (ref 30.0–36.0)
MCV: 96.7 fL (ref 78.0–100.0)
Platelets: 318 10*3/uL (ref 150–400)
RBC: 3.6 MIL/uL — ABNORMAL LOW (ref 3.87–5.11)
RDW: 13.1 % (ref 11.5–15.5)
WBC: 11.4 10*3/uL — ABNORMAL HIGH (ref 4.0–10.5)

## 2017-12-04 LAB — GLUCOSE, CAPILLARY
Glucose-Capillary: 165 mg/dL — ABNORMAL HIGH (ref 65–99)
Glucose-Capillary: 330 mg/dL — ABNORMAL HIGH (ref 65–99)
Glucose-Capillary: 230 mg/dL — ABNORMAL HIGH (ref 65–99)
Glucose-Capillary: 251 mg/dL — ABNORMAL HIGH (ref 65–99)

## 2017-12-04 LAB — PROCALCITONIN: Procalcitonin: <0.1 ng/mL

## 2017-12-04 LAB — BRAIN NATRIURETIC PEPTIDE: B Natriuretic Peptide: 254.2 pg/mL — ABNORMAL HIGH (ref 0.0–100.0)

## 2017-12-04 MED ORDER — AZITHROMYCIN 500 MG PO TABS: 500.0000 mg | ORAL_TABLET | Freq: Every day | ORAL | Status: DC

## 2017-12-04 MED ORDER — FUROSEMIDE 10 MG/ML IJ SOLN
20.0000 mg | Freq: Two times a day (BID) | INTRAMUSCULAR | Status: DC
  Administered 2017-12-04 – 2017-12-05 (×3): 20 mg via INTRAVENOUS
  Filled 2017-12-04 (×3): qty 2

## 2017-12-04 MED ORDER — INSULIN GLARGINE 100 UNIT/ML ~~LOC~~ SOLN
13.0000 [IU] | Freq: Two times a day (BID) | SUBCUTANEOUS | Status: DC
  Administered 2017-12-04 – 2017-12-05 (×2): 13 [IU] via SUBCUTANEOUS
  Filled 2017-12-04 (×2): qty 0.13

## 2017-12-04 MED ORDER — PANTOPRAZOLE SODIUM 40 MG PO TBEC
40.0000 mg | DELAYED_RELEASE_TABLET | Freq: Every day | ORAL | Status: DC
Start: 2017-12-04 — End: 2017-12-05
  Administered 2017-12-04 – 2017-12-05 (×2): 40 mg via ORAL
  Filled 2017-12-04 (×3): qty 1

## 2017-12-04 MED ORDER — CEFDINIR 300 MG PO CAPS
600.0000 mg | ORAL_CAPSULE | Freq: Every day | ORAL | Status: DC
  Filled 2017-12-04: qty 2

## 2017-12-04 MED ORDER — PREDNISONE 50 MG PO TABS
50.0000 mg | ORAL_TABLET | Freq: Two times a day (BID) | ORAL | Status: DC
  Administered 2017-12-04 – 2017-12-05 (×2): 50 mg via ORAL
  Filled 2017-12-04 (×2): qty 1

## 2017-12-04 NOTE — Plan of Care (Signed)
  Problem: Clinical Measurements: Goal: Ability to maintain clinical measurements within normal limits will improve Outcome: Progressing Pt able to ambulate and increase activity level with more endurance without tiring quickly.  Pt reports increase in air exchange and improved ability to breath.

## 2017-12-04 NOTE — Progress Notes (Signed)
PROGRESS NOTE  Zainah Steven ZOX:[id number] DOB: 1965/12/03 DOA: 12/01/2017 PCP: [name], MD  HPI/Recap of past 24 hours: Patient is a pleasant 52 year old female history of diabetes, hypothyroidism, peripheral neuropathy on Lyrica, menopausal on Premarin,migraine headaches, prior history of extensive tobacco use quit in January 2019 presented to the ED 2-week history of worsening productive cough, worsening shortness of breath, fevers, pleuritic chest pain. Patient states has been having respiratory symptoms over the past 2-3 months and was treated with 2 rounds of azithromycin and 2 rounds of Augmentin with oral prednisone however with completion of antibiotic treatment symptoms reoccurred. Patient states shortness of breath worsened over the past 24-48 hrs. with minimal exertion and subsequently presented to the ED on 4/22. Patient seen in the ED noted to be significantly short of breath on minimal exertion. Chest x-ray done revealed a left lower lobe bronchopneumonia. [facility] were called to admit the patient for further evaluation and management.  Over the next few days, patient slowly improving, has never been hypoxic.  But still feels dyspneic, even on exertion.  Yesterday, with ambulation, started feeling very tight and wheezing.  She attributed to Solu-Medrol being decreased to soon.  Today she says she feels a little bit better but is anxious about going home.  In review of previous records, she had an echocardiogram done a week ago which noted grade 1 diastolic dysfunction.  BNP checked and mildly elevated at 254.  Patient started on IV Lasix.  Procalcitonin level normalized.   Assessment/Plan: Principal Problem:   Community acquired pneumonia of left lower lobe of lung (HCC) Active Problems:   Hypothyroidism   Type 2 diabetes mellitus with diabetic neuropathy, without long-term current use of insulin (HCC)   Vitamin D deficiency   Postmenopausal   Vitamin B12  deficiency   Peripheral neuropathy   Hyperlipidemia   GERD (gastroesophageal reflux disease)   Claudication in peripheral vascular disease (HCC)   PNA (pneumonia)   Acute respiratory distress   COPD exacerbation (HCC)  Dyspnea on exertion secondary to acute on chronic diastolic heart failure: Embolus thinking that this is community-acquired pneumonia given the chest x-ray noted pneumonia versus atelectasis and after 3 days, patient has had normal procalcitonin level.  Have discontinued all IV antibiotics.  We will also wean off all IVs including Solu-Medrol and change over to p.o. prednisone.  Continue nebulizers.  Have started IV Lasix and monitoring daily weights.   Type 2 diabetes Last A1c 7.3 on 12/01/17 Continue insulin sliding scale in the hospital plus Lantus.  CBG still running high, likely secondary to steroids.  Increase Lantus dose. Avoid hypoglycemia Resume home medications at discharge  COPD Continue home medications Continue Brovana  Hypothyroidism Continue levothyroxine  Chronic depression/anxiety Continue bupropion  GERD Continue Prilosec  Obesity: Patient meets criteria BMI greater than 30  Code Status: Full code  Family Communication: None at bedside  Disposition Plan: Anticipate discharge home tomorrow once diuresed.   Consultants:  None  Procedures: None Antimicrobials:  IV azithromycin, IV cefepime, IV Vancomycin 4/22-4/24  DVT prophylaxis: Subcu Lovenox   Objective: Vitals:   12/03/17 1958 12/03/17 2100 12/04/17 0500 12/04/17 0758  BP:  140/87 120/87   Pulse: (!) 102 85 72   Resp: 16 18 18    Temp:  98 F (36.7 C) 98 F (36.7 C)   TempSrc:  Oral Oral   SpO2: 99% 97% 95% 95%  Weight:   80.7 kg (178 lb)   Height:        Intake/Output  Summary (Last 24 hours) at 12/04/2017 1303 Last data filed at 12/04/2017 1152 Gross per 24 hour  Intake 650 ml  Output 1001 ml  Net -351 ml   Filed Weights   12/02/17 0516 12/03/17 0621 12/04/17  0500  Weight: 68.9 kg (151 lb 14.4 oz) 80.8 kg (178 lb 1.6 oz) 80.7 kg (178 lb)   Body mass index is 32.56 kg/m.  Exam:   General: Alert and oriented x3, no acute distress  HEENT: Normocephalic and atraumatic, mucous memories are moist  Neck: Supple, no JVD  Cardiovascular: Regular rate and rhythm, S1-S2  Lungs: Good airway exchange with only mild end expiratory wheeze  Abdomen: Soft, nontender, nondistended, positive bowel sounds  Extremities: No clubbing or cyanosis, trace pedal edema  Neuro: No focal deficits  Skin: No skin breaks, tears or lesions  Psychiatric: Patient is appropriate, no evidence of psychoses   Data Reviewed: CBC: Recent Labs  Lab 12/01/17 0909 12/02/17 0411 12/03/17 0243 12/04/17 0437  WBC 10.2 7.9 12.6* 11.4*  NEUTROABS 7.5  --   --   --   HGB 13.3 13.2 11.1* 11.0*  HCT 40.0 38.5 33.6* 34.8*  MCV 94.6 96.0 96.0 96.7  PLT 333 322 318 318   Basic Metabolic Panel: Recent Labs  Lab 12/01/17 0909 12/01/17 2013 12/02/17 0411 12/03/17 0243 12/04/17 0437  NA 134*  --  136 140 137  K 3.9  --  4.2 4.0 3.9  CL 103  --  106 110 104  CO2 19*  --  21* 21* 24  GLUCOSE 207*  --  339* 223* 213*  BUN 10  --  9 7 12   CREATININE 0.66  --  0.74 0.64 0.69  CALCIUM 9.3  --  9.3 8.8* 9.2  MG  --  1.7  --  2.3  --    GFR: Estimated Creatinine Clearance: 82.7 mL/min (by C-G formula based on SCr of 0.69 mg/dL). Liver Function Tests: Recent Labs  Lab 12/01/17 0909  AST 33  ALT 31  ALKPHOS 76  BILITOT 0.4  PROT 6.7  ALBUMIN 3.8   No results for input(s): LIPASE, AMYLASE in the last 168 hours. No results for input(s): AMMONIA in the last 168 hours. Coagulation Profile: Recent Labs  Lab 12/02/17 0411  INR 1.06   Cardiac Enzymes: No results for input(s): CKTOTAL, CKMB, CKMBINDEX, TROPONINI in the last 168 hours. BNP (last 3 results) No results for input(s): PROBNP in the last 8760 hours. HbA1C: Recent Labs    12/01/17 2030  HGBA1C  7.3*   CBG: Recent Labs  Lab 12/03/17 1159 12/03/17 1705 12/03/17 2150 12/04/17 0809 12/04/17 1203  GLUCAP 266* 232* 272* 165* 330*   Lipid Profile: No results for input(s): CHOL, HDL, LDLCALC, TRIG, CHOLHDL, LDLDIRECT in the last 72 hours. Thyroid Function Tests: Recent Labs    12/02/17 0411  TSH 1.383   Anemia Panel: Recent Labs    12/02/17 0411  VITAMINB12 194  FOLATE 13.4  FERRITIN 108  TIBC 333  IRON 65   Urine analysis:    Component Value Date/Time   COLORURINE STRAW (A) 12/01/2017 2329   APPEARANCEUR CLEAR 12/01/2017 2329   LABSPEC 1.012 12/01/2017 2329   PHURINE 6.0 12/01/2017 2329   GLUCOSEU >=500 (A) 12/01/2017 2329   HGBUR NEGATIVE 12/01/2017 2329   BILIRUBINUR NEGATIVE 12/01/2017 2329   KETONESUR NEGATIVE 12/01/2017 2329   PROTEINUR NEGATIVE 12/01/2017 2329   NITRITE NEGATIVE 12/01/2017 2329   LEUKOCYTESUR NEGATIVE 12/01/2017 2329   Sepsis Labs: @LABRCNTIP (procalcitonin:4,lacticidven:4)  )  Recent Results (from the past 240 hour(s))  Culture, blood (routine x 2) Call MD if unable to obtain prior to antibiotics being given     Status: None (Preliminary result)   Collection Time: 12/01/17  5:42 PM  Result Value Ref Range Status   Specimen Description BLOOD RIGHT WRIST  Final   Special Requests   Final    BOTTLES DRAWN AEROBIC AND ANAEROBIC Blood Culture results may not be optimal due to an excessive volume of blood received in culture bottles   Culture   Final    NO GROWTH 2 DAYS Performed at [facility], [address] [place], [place] [address]    Report Status PENDING  Incomplete  Culture, blood (routine x 2) Call MD if unable to obtain prior to antibiotics being given     Status: None (Preliminary result)   Collection Time: 12/01/17  5:47 PM  Result Value Ref Range Status   Specimen Description BLOOD RIGHT WRIST  Final   Special Requests   Final    BOTTLES DRAWN AEROBIC AND ANAEROBIC Blood Culture adequate volume   Culture    Final    NO GROWTH 2 DAYS Performed at [facility], [address] [place], [place] [address]    Report Status PENDING  Incomplete  Urine Culture     Status: None   Collection Time: 12/01/17  6:18 PM  Result Value Ref Range Status   Specimen Description URINE, RANDOM  Final   Special Requests NONE  Final   Culture   Final    NO GROWTH Performed at [facility], [address] [place], [place] [address]    Report Status 12/03/2017 FINAL  Final  MRSA PCR Screening     Status: Abnormal   Collection Time: 12/02/17 12:05 AM  Result Value Ref Range Status   MRSA by PCR POSITIVE (A) NEGATIVE Final    Comment:        The GeneXpert MRSA Assay (FDA approved for NASAL specimens only), is one component of a comprehensive MRSA colonization surveillance program. It is not intended to diagnose MRSA infection nor to guide or monitor treatment for MRSA infections. RESULT CALLED TO, READ BACK BY AND VERIFIED WITH: L OSEI RN 12/02/17 0300 JDW Performed at [facility], [address] [place], [place] [address]       Studies: No results found.  Scheduled Meds: . arformoterol  15 mcg Nebulization BID  . aspirin EC  81 mg Oral Daily  . budesonide (PULMICORT) nebulizer solution  0.5 mg Nebulization BID  . buPROPion  150 mg Oral [id number]  . cholecalciferol  1,000 Units Oral Daily  . cyanocobalamin  1,000 mcg Intramuscular Daily  . enoxaparin (LOVENOX) injection  40 mg Subcutaneous Q24H  . fenofibrate  54 mg Oral Daily  . fluticasone  1 spray Each Nare Daily  . furosemide  20 mg Intravenous Q12H  . insulin aspart  0-20 Units Subcutaneous TID WC  . insulin glargine  10 Units Subcutaneous BID  . levothyroxine  125 mcg Oral QAC breakfast  . loratadine  10 mg Oral Daily  . montelukast  10 mg Oral QHS  . predniSONE  50 mg Oral BID WC  . pregabalin  75 mg Oral TID    Continuous Infusions:    LOS: 3 days     Hollice EspySendil K Marabeth Melland, MD [facility] Pager  [phone]  If 7PM-7AM, please contact night-coverage [email or web address] Password [email or web address] 12/04/2017, 1:03 PM

## 2017-12-04 NOTE — Progress Notes (Signed)
Inpatient Diabetes Program Recommendations  AACE/ADA: New Consensus Statement on Inpatient Glycemic Control (2015)  Target Ranges:  Prepandial:   less than 140 mg/dL      Peak postprandial:   less than 180 mg/dL (1-2 hours)      Critically ill patients:  140 - 180 mg/dL   Lab Results  Component Value Date   GLUCAP 330 (H) 12/04/2017   HGBA1C 7.3 (H) 12/01/2017     Diabetes history: Type 2 DM Outpatient Diabetes medications: Metformin 500 mg BID Current orders for Inpatient glycemic control: Novolog 0-20 units TID, Novolog 0-5 units QHS, Lantus 10 units QD, Solumedrol 40 mg QD to begin taper to Prednisone   Inpatient Diabetes Program Recommendations:    Noted steroid taper, would consider increasing Lantus to 15 units QD.  If to remain inpatient, consider adding meal coverage of Novolog 4 units TID (assuming patient is consuming >50%).   Thanks, Lujean RaveLauren Trinitie Mcgirr, MSN, RNC-OB Diabetes Coordinator [phone] (8a-5p)

## 2017-12-04 NOTE — Plan of Care (Signed)
  Problem: Clinical Measurements: Goal: Ability to maintain clinical measurements within normal limits will improve Outcome: Progressing   Problem: Education: Goal: Ability to verbalize understanding of medication therapies will improve Outcome: Progressing  Patient verbalizes understanding of mechanism of lasix, pulling excess fluid off to decrease workload of heart, and increase endurance level.

## 2017-12-05 DIAGNOSIS — I5031 Acute diastolic (congestive) heart failure: Secondary | ICD-10-CM

## 2017-12-05 DIAGNOSIS — E118 Type 2 diabetes mellitus with unspecified complications: Secondary | ICD-10-CM

## 2017-12-05 LAB — BASIC METABOLIC PANEL
Anion gap: 12 (ref 5–15)
BUN: 19 mg/dL (ref 6–20)
CO2: 27 mmol/L (ref 22–32)
Calcium: 9.5 mg/dL (ref 8.9–10.3)
Chloride: 99 mmol/L — ABNORMAL LOW (ref 101–111)
Creatinine, Ser: 0.75 mg/dL (ref 0.44–1.00)
GFR calc Af Amer: >60 mL/min (ref >60)
GFR calc non Af Amer: >60 mL/min (ref >60)
Glucose, Bld: 254 mg/dL — ABNORMAL HIGH (ref 65–99)
Potassium: 3.7 mmol/L (ref 3.5–5.1)
Sodium: 138 mmol/L (ref 135–145)

## 2017-12-05 LAB — GLUCOSE, CAPILLARY
Glucose-Capillary: 203 mg/dL — ABNORMAL HIGH (ref 65–99)
Glucose-Capillary: 301 mg/dL — ABNORMAL HIGH (ref 65–99)

## 2017-12-05 MED ORDER — ALBUTEROL SULFATE (2.5 MG/3ML) 0.083% IN NEBU: 2.5000 mg | INHALATION_SOLUTION | Freq: Four times a day (QID) | RESPIRATORY_TRACT | 12 refills | Status: DC | PRN

## 2017-12-05 MED ORDER — BLOOD GLUCOSE MONITOR KIT: PACK | 0 refills | Status: AC

## 2017-12-05 MED ORDER — FUROSEMIDE 20 MG PO TABS: ORAL_TABLET | ORAL | 11 refills | Status: DC

## 2017-12-05 MED ORDER — POTASSIUM CHLORIDE ER 10 MEQ PO TBCR: 10.0000 meq | EXTENDED_RELEASE_TABLET | ORAL | 0 refills | Status: DC

## 2017-12-05 NOTE — Discharge Summary (Addendum)
Discharge Summary  Caitlin Barnett TOI:[id number] DOB: 25-Apr-1966  PCP: Unk Pinto, MD  Admit date: 12/01/2017 Discharge date: 12/05/2017  Time spent: 25 minutes  Recommendations for Outpatient Follow-up:  1. New medications: Lasix 40 mg x 3 days then 20 mg daily. 2. New medication: Potassium 10 meq p.o. every other day 3. Patient will follow-up with her PCP in the next few weeks 4. She is advised to return back to work on Monday 4/29  Discharge Diagnoses:  Active Hospital Problems   Diagnosis Date Noted  . Community acquired pneumonia of left lower lobe of lung ([place])   . COPD exacerbation ([place]) 12/02/2017  . PNA (pneumonia) 12/01/2017  . Acute respiratory distress 12/01/2017  . Claudication in peripheral vascular disease ([place]) 11/07/2017  . Hyperlipidemia 10/20/2017  . GERD (gastroesophageal reflux disease) 10/20/2017  . Hypothyroidism 01/16/2017  . Type 2 diabetes mellitus ([place]) 01/16/2017  . Vitamin D deficiency 01/16/2017  . Postmenopausal 01/16/2017  . Vitamin B12 deficiency 01/16/2017  . Peripheral neuropathy 01/16/2017    Resolved Hospital Problems  No resolved problems to display.    Discharge Condition: Improved, being discharged home  Diet recommendation: Heart healthy, carb modified  Vitals:   12/05/17 0642 12/05/17 0916  BP: 124/87   Pulse: 72   Resp: 16   Temp: 97.8 F (36.6 C)   SpO2: 95% 98%    History of present illness:  Patient is a pleasant 52 year old female history of diabetes, hypothyroidism, peripheral neuropathy on Lyrica, menopausal on Premarin,migraine headaches, prior history of extensive tobacco use quit in January 2019 presented to the ED 2-week history of worsening productive cough, worsening shortness of breath, fevers, pleuritic chest pain. Patient states has been having respiratory symptoms over the past 2-3 months and was treated with 2 rounds of azithromycin and 2 rounds of Augmentin with oral prednisone however with completion  of antibiotic treatment symptoms reoccurred. Patient states shortness of breath worsened over the past 24-48 hrs. with minimal exertion and subsequently presented to the ED on 4/22. Patient seen in the ED noted to be significantly short of breath on minimal exertion. Chest x-ray done revealed a left lower lobe bronchopneumonia. [facility] hospital[facility] were called to admit the patient for further evaluation and management.     Hospital Course:  Dyspnea on exertion secondary to acute on chronic diastolic heart failure: Initial thinking that this is community-acquired pneumonia given the chest x-ray noted pneumonia versus atelectasis. Initially on antibiotics and after next few days patient improving slowly, dyspneic even on exertion.  No hypoxia. and after 3 days, patient has had normal procalcitonin level.  Have discontinued all IV antibiotics.    We will also wean off all IVs including Solu-Medrol and change over to p.o. prednisone.  Continue nebulizers.  Have started IV Lasix and monitoring daily weights.    Following 2 doses of IV Lasix, patient diuresed almost 5 L and down 6 pounds from previous day.  We will plan to discharge home on Lasix 40 mg x 3 days and to a new baseline of 20 mg p.o. daily.  For potassium replacement, 10 mEq every other day.  No signs of systolic dysfunction, so no need for ACE inhibitor at this time.  Patient will follow-up with her PCP.  Given information on checking her weight daily as well as watching sodium intake.  Weight on discharge at 172.  Likely baseline is between 165 and 170.  Type 2 diabetes, not controlled with complications, not on long-term insulin use Last A1c 7.3 on  12/01/17 Continue insulin sliding scale in the hospital plus Lantus.    CBGs run high in hospital, secondary to steroids.  Briefly required Lantus. Avoid hypoglycemia Resume home medications at discharge and given prescription for glucometer  Obesity: Patient meets criteria BMI greater than  30.  COPD Continue home medications Continue Brovana Gave prescription for new nebulizer machine  Hypothyroidism Continue levothyroxine  Chronic depression/anxiety Continue bupropion  GERD Continue Prilosec    Procedures:  None  Consultations:  None  Discharge Exam: BP 124/87 (BP Location: Right Arm)   Pulse 72   Temp 97.8 F (36.6 C) (Oral)   Resp 16   Ht '5\' 2"'$  (1.575 m)   Wt 78.3 kg (172 lb 11.2 oz)   SpO2 98%   BMI 31.59 kg/m   General: Alert and oriented x3, no acute distress  Cardiovascular: Regular rate and rhythm S1-S2 Respiratory: Clear to auscultation bilaterally, no wheezing  Discharge Instructions You were cared for by a hospitalist during your hospital stay. If you have any questions about your discharge medications or the care you received while you were in the hospital after you are discharged, you can call the unit and asked to speak with the hospitalist on call if the hospitalist that took care of you is not available. Once you are discharged, your primary care physician will handle any further medical issues. Please note that NO REFILLS for any discharge medications will be authorized once you are discharged, as it is imperative that you return to your primary care physician (or establish a relationship with a primary care physician if you do not have one) for your aftercare needs so that they can reassess your need for medications and monitor your lab values.  Discharge Instructions    DME Nebulizer machine   Complete by:  As directed    Patient needs a nebulizer to treat with the following condition:  COPD (chronic obstructive pulmonary disease) (HCC)   Diet - low sodium heart healthy   Complete by:  As directed    Increase activity slowly   Complete by:  As directed      Allergies as of 12/05/2017      Reactions   Tetanus Toxoids Other (See Comments)   Injection site abcess   Sulfa Antibiotics Hives   itching      Medication List     TAKE these medications   albuterol 108 (90 Base) MCG/ACT inhaler Commonly known as:  PROVENTIL HFA;VENTOLIN HFA INHALE 2 PUFFS INTO THE LUNGS EVERY 4 HOURS AS NEEDED FOR WHEEZING OR SHORTNESS OF BREATH. What changed:  Another medication with the same name was added. Make sure you understand how and when to take each.   albuterol (2.5 MG/3ML) 0.083% nebulizer solution Commonly known as:  PROVENTIL Take 3 mLs (2.5 mg total) by nebulization every 6 (six) hours as needed for wheezing or shortness of breath. What changed:  You were already taking a medication with the same name, and this prescription was added. Make sure you understand how and when to take each.   aspirin EC 81 MG tablet Take 81 mg by mouth daily.   blood glucose meter kit and supplies Kit Dispense based on patient and insurance preference. Use up to four times daily as directed. (FOR ICD-9 250.00, 250.01).   buPROPion 150 MG 24 hr tablet Commonly known as:  WELLBUTRIN XL Take 1 tablet (150 mg total) by mouth every morning.   Butalbital-APAP-Caffeine 50-300-40 MG Caps Commonly known as:  FIORICET Take 1  tablet by mouth 4 (four) times daily as needed. migrains   cetirizine 10 MG tablet Commonly known as:  ZYRTEC Take 10 mg by mouth daily.   Cholecalciferol 1000 units capsule Take 1 tablet by mouth daily.   Cinnamon 500 MG Tabs Take 2 capsules by mouth 2 (two) times daily.   cyanocobalamin 1000 MCG/ML injection Commonly known as:  (VITAMIN B-12) Inject 1,000 mcg into the muscle.   estrogen (conjugated)-medroxyprogesterone 0.45-1.5 MG tablet Commonly known as:  PREMPRO Take 1 tablet daily   fenofibrate 145 MG tablet Commonly known as:  TRICOR Take 1 tablet (145 mg total) by mouth daily.   fluticasone 50 MCG/ACT nasal spray Commonly known as:  FLONASE Place 1 spray into both nostrils daily.   furosemide 20 MG tablet Commonly known as:  LASIX Take 40 mg (2 pills) daily for next 3 days, then '20mg'$  po  daily   ibuprofen 200 MG tablet Commonly known as:  ADVIL,MOTRIN Take 800 mg by mouth as needed for moderate pain.   levothyroxine 125 MCG tablet Commonly known as:  SYNTHROID, LEVOTHROID Take 1 tablet every morning on an empty stomach with only water for 30 minutes   Magnesium 250 MG Tabs Take 250 mg by mouth daily.   metFORMIN 500 MG 24 hr tablet Commonly known as:  GLUCOPHAGE XR Take 2 tablets 2 x / day for Diabetes   montelukast 10 MG tablet Commonly known as:  SINGULAIR Take 1 tablet daily for Allergies   omeprazole 40 MG capsule Commonly known as:  PRILOSEC Take 1 capsule daily for acid indigestion   potassium chloride 10 MEQ tablet Commonly known as:  K-DUR Take 1 tablet (10 mEq total) by mouth every other day.   pregabalin 75 MG capsule Commonly known as:  LYRICA Take 1 capsule 3 x / day for neuropathy pains            Durable Medical Equipment  (From admission, onward)        Start     Ordered   12/05/17 1257  For home use only DME Nebulizer machine  Once    Question:  Patient needs a nebulizer to treat with the following condition  Answer:  COPD (chronic obstructive pulmonary disease) ([place])   12/05/17 1257   12/05/17 0000  DME Nebulizer machine    Question:  Patient needs a nebulizer to treat with the following condition  Answer:  COPD (chronic obstructive pulmonary disease) ([place])   12/05/17 1140      Allergies  Allergen Reactions  . Tetanus Toxoids Other (See Comments)    Injection site abcess  . Sulfa Antibiotics Hives    itching   Follow-up Information    Unk Pinto, MD Follow up in 2 week(s).   Specialty:  Internal Medicine Contact information: [address] [place] [address] [phone]            The results of significant diagnostics from this hospitalization (including imaging, microbiology, ancillary and laboratory) are listed below for reference.    Significant Diagnostic Studies: Dg Chest 2  View  Result Date: 12/01/2017 CLINICAL DATA:  Shortness of breath and cough. Fevers. Symptoms for 2 weeks. EXAM: CHEST - 2 VIEW COMPARISON:  None. FINDINGS: Streaky opacity at the left base, lower lobe based on the lateral. No effusion or edema. Normal heart size. Thoracic dextroscoliosis. IMPRESSION: Left lower lobe bronchopneumonia and/or atelectasis. Followup PA and lateral chest X-ray is recommended in 3-4 weeks to ensure resolution. Electronically Signed  By: Monte Fantasia M.D.   On: 12/01/2017 09:51   Ct Angio Chest Pe W/cm &/or Wo Cm  Result Date: 12/01/2017 CLINICAL DATA:  Left-sided chest and back pain with worsening shortness of breath. EXAM: CT ANGIOGRAPHY CHEST WITH CONTRAST TECHNIQUE: Multidetector CT imaging of the chest was performed using the standard protocol during bolus administration of intravenous contrast. Multiplanar CT image reconstructions and MIPs were obtained to evaluate the vascular anatomy. CONTRAST:  186m ISOVUE-370 IOPAMIDOL (ISOVUE-370) INJECTION 76% COMPARISON:  Chest x-ray from same day. FINDINGS: Cardiovascular: Satisfactory opacification of the pulmonary arteries to the segmental level. No evidence of pulmonary embolism. Normal heart size. No pericardial effusion. Normal caliber thoracic aorta. Mediastinum/Nodes: There are a few prominent mediastinal and left hilar lymph nodes measuring up to 12 mm in short axis. No axillary or hilar lymphadenopathy. The thyroid gland, trachea, and esophagus are unremarkable. Lungs/Pleura: Prominent peribronchial thickening in the left lower lobe with patchy tree-in-bud centrilobular nodularity. No pleural effusion or pneumothorax. Mild mosaic attenuation in the lungs. No suspicious pulmonary nodule. Upper Abdomen: No acute abnormality.  Hepatic steatosis. Musculoskeletal: No chest wall abnormality. No acute or significant osseous findings. Review of the MIP images confirms the above findings. IMPRESSION: 1.  No evidence of pulmonary  embolism. 2. Prominent peribronchial thickening in the left lower lobe with patchy tree-in-bud centrilobular nodularity, suspicious for aspiration or atypical infection. 3. Mildly prominent mediastinal and left hilar lymph nodes are nonspecific, but favored reactive. 4. Hepatic steatosis. Electronically Signed   By: WTitus DubinM.D.   On: 12/01/2017 18:21    Microbiology: Recent Results (from the past 240 hour(s))  Culture, blood (routine x 2) Call MD if unable to obtain prior to antibiotics being given     Status: None (Preliminary result)   Collection Time: 12/01/17  5:42 PM  Result Value Ref Range Status   Specimen Description BLOOD RIGHT WRIST  Final   Special Requests   Final    BOTTLES DRAWN AEROBIC AND ANAEROBIC Blood Culture results may not be optimal due to an excessive volume of blood received in culture bottles   Culture   Final    NO GROWTH 4 DAYS Performed at [facility] [address] [place] [address]   Report Status PENDING  Incomplete  Culture, blood (routine x 2) Call MD if unable to obtain prior to antibiotics being given     Status: None (Preliminary result)   Collection Time: 12/01/17  5:47 PM  Result Value Ref Range Status   Specimen Description BLOOD RIGHT WRIST  Final   Special Requests   Final    BOTTLES DRAWN AEROBIC AND ANAEROBIC Blood Culture adequate volume   Culture   Final    NO GROWTH 4 DAYS Performed at [facility] [address], [place] [address]   Report Status PENDING  Incomplete  Urine Culture     Status: None   Collection Time: 12/01/17  6:18 PM  Result Value Ref Range Status   Specimen Description URINE, RANDOM  Final   Special Requests NONE  Final   Culture   Final    NO GROWTH Performed at [facility] [address] [place] [address]   Report Status 12/03/2017 FINAL  Final  MRSA PCR Screening     Status: Abnormal   Collection Time: 12/02/17 12:05 AM  Result Value Ref Range  Status   MRSA by PCR POSITIVE (A) NEGATIVE Final  Comment:        The GeneXpert MRSA Assay (FDA approved for NASAL specimens only), is one component of a comprehensive MRSA colonization surveillance program. It is not intended to diagnose MRSA infection nor to guide or monitor treatment for MRSA infections. RESULT CALLED TO, READ BACK BY AND VERIFIED WITH: L OSEI RN 12/02/17 0300 JDW Performed at [facility], [address] [place], [place] [address]      Labs: Basic Metabolic Panel: Recent Labs  Lab 12/01/17 [phone] 12/01/17 2013 12/02/17 0411 12/03/17 0243 12/04/17 0437 12/05/17 0502  NA 134*  --  136 140 137 138  K 3.9  --  4.2 4.0 3.9 3.7  CL 103  --  106 110 104 99*  CO2 19*  --  21* 21* 24 27  GLUCOSE 207*  --  339* 223* 213* 254*  BUN 10  --  '9 7 12 19  '$ CREATININE 0.66  --  0.74 0.64 0.69 0.75  CALCIUM 9.3  --  9.3 8.8* 9.2 9.5  MG  --  1.7  --  2.3  --   --    Liver Function Tests: Recent Labs  Lab 12/01/17 0909  AST 33  ALT 31  ALKPHOS 76  BILITOT 0.4  PROT 6.7  ALBUMIN 3.8   No results for input(s): LIPASE, AMYLASE in the last 168 hours. No results for input(s): AMMONIA in the last 168 hours. CBC: Recent Labs  Lab 12/01/17 0909 12/02/17 0411 12/03/17 0243 12/04/17 0437  WBC 10.2 7.9 12.6* 11.4*  NEUTROABS 7.5  --   --   --   HGB 13.3 13.2 11.1* 11.0*  HCT 40.0 38.5 33.6* 34.8*  MCV 94.6 96.0 96.0 96.7  PLT 333 322 318 318   Cardiac Enzymes: No results for input(s): CKTOTAL, CKMB, CKMBINDEX, TROPONINI in the last 168 hours. BNP: BNP (last 3 results) Recent Labs    12/04/17 0437  BNP 254.2*    ProBNP (last 3 results) No results for input(s): PROBNP in the last 8760 hours.  CBG: Recent Labs  Lab 12/04/17 0809 12/04/17 1203 12/04/17 1731 12/04/17 2041 12/05/17 0752  GLUCAP 165* 330* 230* 251* 203*       Signed:  Annita Brod, MD [facility] 12/05/2017, 11:42 AM

## 2017-12-05 NOTE — Progress Notes (Signed)
Patient was discharged home by MD order; discharged instructions review and give to patient with care notes; IV DIC; skin intact; patient will be escorted to the car by a volunteer via wheelchair.  

## 2017-12-05 NOTE — Care Management Note (Signed)
Case Management Note  Patient Details  Name: Clearence CheekSusan Fogelman MRN: [id number] Date of Birth: April 08, 1966  Subjective/Objective:     Admitted with CAP.              Action/Plan: Transition to home today.  Expected Discharge Date:  12/05/17               Expected Discharge Plan:  Home/Self Care  In-House Referral:     Discharge planning Services  CM Consult  Post Acute Care Choice:    Choice offered to:  Patient  DME Arranged:  Nebulizer machine DME Agency:  [facility] referral made per NCM, awaiting insurance approval.  HH Arranged:    N/A HH Agency:   N/A  Status of Service:  Completed, signed off  If discussed at Long Length of Stay Meetings, dates discussed:    Additional Comments:  Epifanio LeschesCole, Inda Mcglothen Hudson, RN 12/05/2017, 1:00 PM

## 2017-12-06 LAB — CULTURE, BLOOD (ROUTINE X 2)
Culture: NO GROWTH
Culture: NO GROWTH
Special Requests: ADEQUATE

## 2017-12-08 NOTE — Progress Notes (Signed)
Hospital follow up  Assessment and Plan: Hospital visit follow up for: COPD exacerbation, CHF, LLL pneumonia Hospital discharge meds were reviewed, and reconciled with the patient.   Acute diastolic CHF (congestive heart failure) (HCC) Continue lasix, potassium Limit salt and fluid intake Continue daily weights - call if increasing edema or sudden weight gain 3 + lb  Follow up with cardiology as scheduled -     BASIC METABOLIC PANEL WITH GFR       -     potassium chloride (K-DUR) 10 MEQ tablet; Take 1 tablet (10 mEq total) by mouth every other day.  COPD exacerbation ([place]) Discussed with patient and she would like to be referred to pulmonology for PFTs after discussion today. Was not prescribed brovana at discharge - provided with symbicort samples today, will prescribe if there is perceived benefit.  -     CBC with Differential/Platelet  -     Ambulatory referral to Pulmonology  Pneumonia of left lower lobe due to infectious organism [facility]) Lungs clear to auscultation today -     CBC with Differential/Platelet -     DG Chest 2 View; Future -     predniSONE (DELTASONE) 20 MG tablet; 2 tablets daily for 3 days, 1 tablet daily for 4 days. -     promethazine-dextromethorphan (PROMETHAZINE-DM) 6.25-15 MG/5ML syrup; Take 5 mLs by mouth 4 (four) times daily as needed for cough.  Hepatic steatosis       Noted on CT chest; LFT WNL; weight loss advised, continue to monitor    Medications Discontinued During This Encounter  Medication Reason  . potassium chloride (K-DUR) 10 MEQ tablet Reorder    Over 40 minutes of exam, counseling, chart review, and complex, high/moderate level critical decision making was performed this visit.   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 Admit date to the hospital was 12/01/17, patient was discharged from the hospital on 12/05/17 and our clinical staff contacted the office the day after discharge to set up a follow up appointment. The discharge summary, medications, and diagnostic test results were reviewed before meeting with the patient. The patient was admitted for: COPD exacerbation, pneumonia of LLL, ?acute exacerbation of CHF.   Patient with prior history of extensive tobacco use quit in January 2019 and COPD (not previously treated by inhalers) presented to the ED 2-week history of worsening productive cough, worsening shortness of breath, fevers, pleuritic chest pain; URI symptoms were ongoing for past 3-4 months and had been treated by our office with 2 rounds of azithromycin and Augmentin with prednisone with recurrence, with acute worsening over the past 1-2 days, and reported dyspnea with minimal exertion and subsequently presented to the [facility], [place] 4/22. Chest x-ray done revealed a left lower lobe bronchopneumonia vs atelectasis and was initiated on IV solu-medrol and IV abx, which was discontinued after 3rd day due to significant improvement and she was discharged after transitioning to oral prednisone.   Patient was recently referred by our office to cardiology for evaluation due to new sensation of palpitations and LE edema with unremarkable EKG; however ECHO from 11/25/2017 was "essentially normal" per Dr. Quay Burow, showed grade 1 diastolic dysfunction with LV EF 60-65%. However, while hospitalized the patient was diuresed by IV  lasix with 5 L and ~6 lb of weight loss while admitted and discharged on newly initiated PO lasix 40 mg daily with potassium supplement x 3 days, then to continue daily lasix 20 mg; discharge weight 172# with baseline thought to be 165-170#.   Wt Readings from Last 3 Encounters:  12/09/17 173 lb (78.5 kg)  12/05/17 172 lb 11.2 oz (78.3 kg)  11/27/17 173 lb  (78.5 kg)   She presents today with some emotional distress, tearful, reporting she does not feel she was listened to while hospitalized and dissatisfied with her experience. She does report she is feeling somewhat better, is continuing with 20 mg daily lasix with home weights stable, reports she has noted reduced generalized edema (is now able to wear her rings). However, she  continues to have a sense of abdominal distention, exertional dyspnea, orthopnea (sleeping with 2 pillows) which is new from baseline/atypical for her. She reports Garlon Hatchet was never prescribed, and she has been using home nebulized albuterol as needed. She reports ongoing cough, and lower chest wall pain with coughing. She is very concerned regarding ongoing changes from her baseline for the past 6 months, and requests referral to pulmonology for further evaluation.   She has a follow up with cardiology next month. She is scheduled for 30-day event monitoring in the meantime.   Home health is not involved.   Images while in the hospital: Dg Chest 2 View  Result Date: 12/01/2017 CLINICAL DATA:  Shortness of breath and cough. Fevers. Symptoms for 2 weeks. EXAM: CHEST - 2 VIEW COMPARISON:  None. FINDINGS: Streaky opacity at the left base, lower lobe based on the lateral. No effusion or edema. Normal heart size. Thoracic dextroscoliosis. IMPRESSION: Left lower lobe bronchopneumonia and/or atelectasis. Followup PA and lateral chest X-ray is recommended in 3-4 weeks to ensure resolution. Electronically Signed   By: Monte Fantasia M.D.   On: 12/01/2017 09:51   Ct Angio Chest Pe W/cm &/or Wo Cm  Result Date: 12/01/2017 CLINICAL DATA:  Left-sided chest and back pain with worsening shortness of breath. EXAM: CT ANGIOGRAPHY CHEST WITH CONTRAST TECHNIQUE: Multidetector CT imaging of the chest was performed using the standard protocol during bolus administration of intravenous contrast. Multiplanar CT image reconstructions and MIPs were  obtained to evaluate the vascular anatomy. CONTRAST:  168m ISOVUE-370 IOPAMIDOL (ISOVUE-370) INJECTION 76% COMPARISON:  Chest x-ray from same day. FINDINGS: Cardiovascular: Satisfactory opacification of the pulmonary arteries to the segmental level. No evidence of pulmonary embolism. Normal heart size. No pericardial effusion. Normal caliber thoracic aorta. Mediastinum/Nodes: There are a few prominent mediastinal and left hilar lymph nodes measuring up to 12 mm in short axis. No axillary or hilar lymphadenopathy. The thyroid gland, trachea, and esophagus are unremarkable. Lungs/Pleura: Prominent peribronchial thickening in the left lower lobe with patchy tree-in-bud centrilobular nodularity. No pleural effusion or pneumothorax. Mild mosaic attenuation in the lungs. No suspicious pulmonary nodule. Upper Abdomen: No acute abnormality.  Hepatic steatosis. Musculoskeletal: No chest wall abnormality. No acute or significant osseous findings. Review of the MIP images confirms the above findings. IMPRESSION: 1.  No evidence of pulmonary embolism. 2. Prominent peribronchial thickening in the left lower lobe with patchy tree-in-bud centrilobular nodularity, suspicious for aspiration or atypical infection. 3. Mildly prominent mediastinal and left hilar lymph nodes are nonspecific, but favored reactive. 4. Hepatic steatosis. Electronically Signed   By: WTitus DubinM.D.   On: 12/01/2017 18:21    Past Medical History:  Diagnosis Date  . Diabetes mellitus without complication (  [place])   . Diverticulitis   . GERD (gastroesophageal reflux disease)   . Hyperlipidemia   . IBS (irritable bowel syndrome)   . Migraine   . Pneumonia 11/2017  . Thyroid disease   . UTI (urinary tract infection)      Allergies  Allergen Reactions  . Tetanus Toxoids Other (See Comments)    Injection site abcess  . Sulfa Antibiotics Hives    itching      Current Outpatient Medications on File Prior to Visit  Medication Sig Dispense  Refill  . albuterol (PROVENTIL HFA;VENTOLIN HFA) 108 (90 Base) MCG/ACT inhaler INHALE 2 PUFFS INTO THE LUNGS EVERY 4 HOURS AS NEEDED FOR WHEEZING OR SHORTNESS OF BREATH. 18 Inhaler 0  . albuterol (PROVENTIL) (2.5 MG/3ML) 0.083% nebulizer solution Take 3 mLs (2.5 mg total) by nebulization every 6 (six) hours as needed for wheezing or shortness of breath. 75 mL 12  . aspirin EC 81 MG tablet Take 81 mg by mouth daily.    . blood glucose meter kit and supplies KIT Dispense based on patient and insurance preference. Use up to four times daily as directed. (FOR ICD-9 250.00, 250.01). 1 each 0  . Butalbital-APAP-Caffeine (FIORICET) 50-300-40 MG CAPS Take 1 tablet by mouth 4 (four) times daily as needed. migrains 30 capsule 0  . cetirizine (ZYRTEC) 10 MG tablet Take 10 mg by mouth daily.    . Cholecalciferol 1000 units capsule Take 1 tablet by mouth daily.    . Cinnamon 500 MG TABS Take 2 capsules by mouth 2 (two) times daily.    . cyanocobalamin (,VITAMIN B-12,) 1000 MCG/ML injection Inject 1,000 mcg into the muscle.  11  . estrogen, conjugated,-medroxyprogesterone (PREMPRO) 0.45-1.5 MG tablet Take 1 tablet daily 90 tablet 1  . fenofibrate (TRICOR) 145 MG tablet Take 1 tablet (145 mg total) by mouth daily. 90 tablet 1  . fluticasone (FLONASE) 50 MCG/ACT nasal spray Place 1 spray into both nostrils daily. 16 g 2  . furosemide (LASIX) 20 MG tablet Take 40 mg (2 pills) daily for next 3 days, then 54m po daily 33 tablet 11  . ibuprofen (ADVIL,MOTRIN) 200 MG tablet Take 800 mg by mouth as needed for moderate pain.    .Marland Kitchenlevothyroxine (SYNTHROID, LEVOTHROID) 125 MCG tablet Take 1 tablet every morning on an empty stomach with only water for 30 minutes 90 tablet 1  . Magnesium 250 MG TABS Take 250 mg by mouth daily.    . metFORMIN (GLUCOPHAGE XR) 500 MG 24 hr tablet Take 2 tablets 2 x / day for Diabetes 360 tablet 1  . omeprazole (PRILOSEC) 40 MG capsule Take 1 capsule daily for acid indigestion 90 capsule 1  .  pregabalin (LYRICA) 75 MG capsule Take 1 capsule 3 x / day for neuropathy pains 270 capsule 1  . buPROPion (WELLBUTRIN XL) 150 MG 24 hr tablet Take 1 tablet (150 mg total) by mouth every morning. (Patient not taking: Reported on 12/09/2017) 90 tablet 1  . montelukast (SINGULAIR) 10 MG tablet Take 1 tablet daily for Allergies 90 tablet 3   No current facility-administered medications on file prior to visit.     ROS: all negative except above.   Physical Exam: Filed Weights   12/09/17 1456  Weight: 173 lb (78.5 kg)   BP 110/76   Pulse 98   Temp (!) 97.5 F (36.4 C)   Ht 5' 2" (1.575 m)   Wt 173 lb (78.5 kg)   SpO2 98%   BMI 31.64  kg/m  General Appearance: Well nourished, in no apparent distress. Eyes: PERRLA, EOMs, conjunctiva no swelling or erythema Sinuses: No Frontal/maxillary tenderness ENT/Mouth: Ext aud canals clear, TMs without erythema, bulging. No erythema, swelling, or exudate on post pharynx.  Tonsils not swollen or erythematous. Hearing normal.  Neck: Supple.  Respiratory: Respiratory effort normal, BS equal bilaterally without rales, rhonchi, wheezing or stridor.  Cardio: RRR with no MRGs. Brisk peripheral pulses without edema.  Abdomen: Soft, + BS.  Somewhat ?distended, though not firm. Generalized tenderness, no guarding, rebound, hernias, palpable masses.  Lymphatics: Non tender without lymphadenopathy.  Musculoskeletal: Symmetrical strength, slow gait.  Skin: Warm, dry without rashes, lesions, ecchymosis.  Psych: Awake and oriented X 3, tearful affect, Insight and Judgment appropriate.     [name], NP 6:17 PM [facility]

## 2017-12-09 ENCOUNTER — Encounter: Payer: Self-pay | Admitting: Adult Health

## 2017-12-09 ENCOUNTER — Ambulatory Visit: Payer: Managed Care, Other (non HMO) | Admitting: Adult Health

## 2017-12-09 VITALS — BP 110/76 | HR 98 | Temp 97.5°F | Ht 62.0 in | Wt 173.0 lb

## 2017-12-09 DIAGNOSIS — J441 Chronic obstructive pulmonary disease with (acute) exacerbation: Secondary | ICD-10-CM

## 2017-12-09 DIAGNOSIS — K760 Fatty (change of) liver, not elsewhere classified: Secondary | ICD-10-CM

## 2017-12-09 DIAGNOSIS — I5031 Acute diastolic (congestive) heart failure: Secondary | ICD-10-CM | POA: Diagnosis not present

## 2017-12-09 DIAGNOSIS — J181 Lobar pneumonia, unspecified organism: Secondary | ICD-10-CM | POA: Diagnosis not present

## 2017-12-09 DIAGNOSIS — R0609 Other forms of dyspnea: Secondary | ICD-10-CM | POA: Diagnosis not present

## 2017-12-09 DIAGNOSIS — R0600 Dyspnea, unspecified: Secondary | ICD-10-CM

## 2017-12-09 DIAGNOSIS — J189 Pneumonia, unspecified organism: Secondary | ICD-10-CM

## 2017-12-09 LAB — CBC WITH DIFFERENTIAL/PLATELET
Basophils Absolute: 111 cells/uL (ref 0–200)
Basophils Relative: 1.1 %
Eosinophils Absolute: 525 cells/uL — ABNORMAL HIGH (ref 15–500)
Eosinophils Relative: 5.2 %
HCT: 44.5 % (ref 35.0–45.0)
Hemoglobin: 15.5 g/dL (ref 11.7–15.5)
Lymphs Abs: 2666 cells/uL (ref 850–3900)
MCH: 31.3 pg (ref 27.0–33.0)
MCHC: 34.8 g/dL (ref 32.0–36.0)
MCV: 89.7 fL (ref 80.0–100.0)
MPV: 10.1 fL (ref 7.5–12.5)
Monocytes Relative: 8.1 %
Neutro Abs: 5979 cells/uL (ref 1500–7800)
Neutrophils Relative %: 59.2 %
Platelets: 411 10*3/uL — ABNORMAL HIGH (ref 140–400)
RBC: 4.96 10*6/uL (ref 3.80–5.10)
RDW: 12.3 % (ref 11.0–15.0)
Total Lymphocyte: 26.4 %
WBC mixed population: 818 cells/uL (ref 200–950)
WBC: 10.1 10*3/uL (ref 3.8–10.8)

## 2017-12-09 MED ORDER — BUDESONIDE-FORMOTEROL FUMARATE 160-4.5 MCG/ACT IN AERO: 2.0000 | INHALATION_SPRAY | Freq: Two times a day (BID) | RESPIRATORY_TRACT | 12 refills | Status: DC

## 2017-12-09 MED ORDER — PROMETHAZINE-DM 6.25-15 MG/5ML PO SYRP: 5.0000 mL | ORAL_SOLUTION | Freq: Four times a day (QID) | ORAL | 1 refills | Status: DC | PRN

## 2017-12-09 MED ORDER — PREDNISONE 20 MG PO TABS: ORAL_TABLET | ORAL | 0 refills | Status: DC

## 2017-12-09 MED ORDER — POTASSIUM CHLORIDE ER 10 MEQ PO TBCR: 10.0000 meq | EXTENDED_RELEASE_TABLET | ORAL | 0 refills | Status: DC

## 2017-12-10 LAB — BASIC METABOLIC PANEL WITH GFR
BUN: 17 mg/dL (ref 7–25)
CO2: 27 mmol/L (ref 20–32)
Calcium: 10 mg/dL (ref 8.6–10.4)
Chloride: 99 mmol/L (ref 98–110)
Creat: 0.7 mg/dL (ref 0.50–1.05)
GFR, Est African American: 117 mL/min/{1.73_m2} (ref >=60)
GFR, Est Non African American: 101 mL/min/{1.73_m2} (ref >=60)
Glucose, Bld: 86 mg/dL (ref 65–99)
Potassium: 4.8 mmol/L (ref 3.5–5.3)
Sodium: 138 mmol/L (ref 135–146)

## 2017-12-25 ENCOUNTER — Other Ambulatory Visit: Payer: Self-pay | Admitting: Internal Medicine

## 2017-12-25 ENCOUNTER — Encounter: Payer: Self-pay | Admitting: Adult Health

## 2017-12-25 ENCOUNTER — Other Ambulatory Visit: Payer: Self-pay | Admitting: Adult Health

## 2017-12-25 ENCOUNTER — Other Ambulatory Visit: Payer: Self-pay | Admitting: Nurse Practitioner

## 2017-12-25 DIAGNOSIS — R519 Headache, unspecified: Secondary | ICD-10-CM

## 2017-12-25 DIAGNOSIS — Z9109 Other allergy status, other than to drugs and biological substances: Secondary | ICD-10-CM

## 2017-12-25 DIAGNOSIS — G43009 Migraine without aura, not intractable, without status migrainosus: Secondary | ICD-10-CM

## 2017-12-25 DIAGNOSIS — R51 Headache: Principal | ICD-10-CM

## 2017-12-25 DIAGNOSIS — R062 Wheezing: Secondary | ICD-10-CM

## 2017-12-25 MED ORDER — ALBUTEROL SULFATE HFA 108 (90 BASE) MCG/ACT IN AERS: 2.0000 | INHALATION_SPRAY | RESPIRATORY_TRACT | 2 refills | Status: DC | PRN

## 2017-12-25 MED ORDER — BUTALBITAL-APAP-CAFFEINE 50-325-40 MG PO TABS: ORAL_TABLET | ORAL | 0 refills | Status: DC

## 2017-12-25 MED ORDER — MONTELUKAST SODIUM 10 MG PO TABS: ORAL_TABLET | ORAL | 3 refills | Status: DC

## 2017-12-25 MED ORDER — POTASSIUM CHLORIDE ER 10 MEQ PO TBCR: 10.0000 meq | EXTENDED_RELEASE_TABLET | ORAL | 0 refills | Status: DC

## 2017-12-26 ENCOUNTER — Ambulatory Visit: Payer: Managed Care, Other (non HMO) | Admitting: Cardiovascular Disease

## 2017-12-26 ENCOUNTER — Other Ambulatory Visit: Payer: Self-pay | Admitting: Adult Health

## 2017-12-26 MED ORDER — FLUTICASONE-UMECLIDIN-VILANT 100-62.5-25 MCG/INH IN AEPB: 100.0000 ug | INHALATION_SPRAY | Freq: Every day | RESPIRATORY_TRACT | 6 refills | Status: DC

## 2018-01-03 ENCOUNTER — Other Ambulatory Visit: Payer: Self-pay | Admitting: Internal Medicine

## 2018-01-03 DIAGNOSIS — Z780 Asymptomatic menopausal state: Secondary | ICD-10-CM

## 2018-01-03 DIAGNOSIS — N951 Menopausal and female climacteric states: Secondary | ICD-10-CM

## 2018-01-06 ENCOUNTER — Ambulatory Visit: Payer: Managed Care, Other (non HMO) | Admitting: Cardiovascular Disease

## 2018-01-06 ENCOUNTER — Encounter: Payer: Self-pay | Admitting: Cardiovascular Disease

## 2018-01-06 DIAGNOSIS — I5031 Acute diastolic (congestive) heart failure: Secondary | ICD-10-CM | POA: Diagnosis not present

## 2018-01-06 DIAGNOSIS — E781 Pure hyperglyceridemia: Secondary | ICD-10-CM

## 2018-01-06 DIAGNOSIS — I739 Peripheral vascular disease, unspecified: Secondary | ICD-10-CM

## 2018-01-06 MED ORDER — FUROSEMIDE 40 MG PO TABS: ORAL_TABLET | ORAL | 6 refills | Status: DC

## 2018-01-06 NOTE — Assessment & Plan Note (Signed)
Caitlin Barnett was recently hospitalized on 12/01/2017 with shortness of breath she is found to have a left lower lobe pneumonia.  2D echo revealed normal LV function with grade 1 diastolic dysfunction.  She did diurese 5 L with Lasix and felt clinically improved and remains on 20 mg of Lasix daily which can double in the event she has more edema than usual she says that her breathing is better than has been in a while.

## 2018-01-06 NOTE — Progress Notes (Signed)
01/06/2018 [place]   [birth year]  [id number]  Primary Physician Unk Pinto, MD Primary Cardiologist: Lorretta Harp MD Lupe Carney, [place]  HPI:  Caitlin Barnett is a 52 y.o.   mildly overweight married Caucasian female mother of 2, grandmother of hypertension works as a Teacher, adult education at [employer]. She was referred by Liane Comber nurse practitioner for cardiovascular evaluation because of palpitations, chest pain dyspnea and edema.  I last saw her in the office/29/19.  Her cardiovascular risk factors include 15-pack-years of tobacco abuse having quit back in January of this year as well as reated hyperlipidemia and diabetes. There is no family history. She has never had a heart attack or stroke.  Over the past 3 months she's noticed increasing fatigue, dyspnea on exertion, lower extremity edema and atypical chest pain occurring on a weekly basis. She is also noted palpitations and had a Holter monitor performed recently that showed sinus rhythm/sinus tachycardia with PACs and PVCs. A 2D echocardiogram revealed normal LV systolic function with grade 1 diastolic dysfunction.  A Myoview stress test was normal and an event monitor showed no arrhythmias.  She was hospitalized 12/01/2017 with shortness of breath and was diagnosed with left lower lobe pneumonia which was adequately treated.  She was also volume overloaded and diuresed 5 L with IV Lasix.  She is currently on p.o. Lasix and feels clinically improved.  She probably had an element of diastolic heart failure as well.     Current Meds  Medication Sig  . albuterol (PROVENTIL HFA;VENTOLIN HFA) 108 (90 Base) MCG/ACT inhaler Inhale 2 puffs into the lungs every 4 (four) hours as needed for wheezing or shortness of breath.  Marland Kitchen albuterol (PROVENTIL) (2.5 MG/3ML) 0.083% nebulizer solution Take 3 mLs (2.5 mg total) by nebulization every 6 (six) hours as needed for wheezing or shortness of breath.  Marland Kitchen aspirin EC 81 MG tablet Take  81 mg by mouth daily.  . blood glucose meter kit and supplies KIT Dispense based on patient and insurance preference. Use up to four times daily as directed. (FOR ICD-9 250.00, 250.01).  . butalbital-acetaminophen-caffeine (FIORICET, ESGIC) 50-325-40 MG tablet Take 1/2 to 1 tablet every 4 hours if needed for headache  . cetirizine (ZYRTEC) 10 MG tablet Take 10 mg by mouth daily.  . Cholecalciferol 1000 units capsule Take 1 tablet by mouth daily.  . Cinnamon 500 MG TABS Take 2 capsules by mouth 2 (two) times daily.  . cyanocobalamin (,VITAMIN B-12,) 1000 MCG/ML injection Inject 1,000 mcg into the muscle.  . estrogen, conjugated,-medroxyprogesterone (PREMPRO) 0.45-1.5 MG tablet TAKE 1 TABLET BY MOUTH EVERY DAY  . fenofibrate (TRICOR) 145 MG tablet Take 1 tablet (145 mg total) by mouth daily.  . fluticasone (FLONASE) 50 MCG/ACT nasal spray Place 1 spray into both nostrils daily.  . Fluticasone-Umeclidin-Vilant (TRELEGY ELLIPTA) 100-62.5-25 MCG/INH AEPB Inhale 100 mcg into the lungs daily.  . furosemide (LASIX) 40 MG tablet Take 40 mg (1 tab) by mouth daily. May take 1/2 tab on most days and 1 full tab daily when needed.  Marland Kitchen ibuprofen (ADVIL,MOTRIN) 200 MG tablet Take 800 mg by mouth as needed for moderate pain.  Marland Kitchen levothyroxine (SYNTHROID, LEVOTHROID) 125 MCG tablet Take 1 tablet every morning on an empty stomach with only water for 30 minutes  . Magnesium 250 MG TABS Take 250 mg by mouth daily.  . metFORMIN (GLUCOPHAGE XR) 500 MG 24 hr tablet Take 2 tablets 2 x / day for Diabetes  .  montelukast (SINGULAIR) 10 MG tablet Take 1 tablet daily for Allergies  . omeprazole (PRILOSEC) 40 MG capsule Take 1 capsule daily for acid indigestion  . potassium chloride (K-DUR) 10 MEQ tablet Take 1 tablet (10 mEq total) by mouth every other day.  . pregabalin (LYRICA) 75 MG capsule Take 1 capsule 3 x / day for neuropathy pains  . promethazine-dextromethorphan (PROMETHAZINE-DM) 6.25-15 MG/5ML syrup Take 5 mLs by  mouth 4 (four) times daily as needed for cough.  . [DISCONTINUED] furosemide (LASIX) 20 MG tablet Take 40 mg (2 pills) daily for next 3 days, then '20mg'$  po daily  . [DISCONTINUED] predniSONE (DELTASONE) 20 MG tablet 2 tablets daily for 3 days, 1 tablet daily for 4 days.     Allergies  Allergen Reactions  . Tetanus Toxoids Other (See Comments)    Injection site abcess  . Sulfa Antibiotics Hives    itching    Social History   Socioeconomic History  . Marital status: Married    Spouse name: Not on file  . Number of children: Not on file  . Years of education: Not on file  . Highest education level: Not on file  Occupational History  . Not on file  Social Needs  . Financial resource strain: Not on file  . Food insecurity:    Worry: Not on file    Inability: Not on file  . Transportation needs:    Medical: Not on file    Non-medical: Not on file  Tobacco Use  . Smoking status: Former Smoker    Packs/day: 0.50    Years: 30.00    Pack years: 15.00    Types: Cigarettes    Last attempt to quit: 08/24/2017    Years since quitting: 0.3  . Smokeless tobacco: Never Used  Substance and Sexual Activity  . Alcohol use: No  . Drug use: No  . Sexual activity: Not on file  Lifestyle  . Physical activity:    Days per week: Not on file    Minutes per session: Not on file  . Stress: Not on file  Relationships  . Social connections:    Talks on phone: Not on file    Gets together: Not on file    Attends religious service: Not on file    Active member of club or organization: Not on file    Attends meetings of clubs or organizations: Not on file    Relationship status: Not on file  . Intimate partner violence:    Fear of current or ex partner: Not on file    Emotionally abused: Not on file    Physically abused: Not on file    Forced sexual activity: Not on file  Other Topics Concern  . Not on file  Social History Narrative  . Not on file     Review of Systems: General:  negative for chills, fever, night sweats or weight changes.  Cardiovascular: negative for chest pain, dyspnea on exertion, edema, orthopnea, palpitations, paroxysmal nocturnal dyspnea or shortness of breath Dermatological: negative for rash Respiratory: negative for cough or wheezing Urologic: negative for hematuria Abdominal: negative for nausea, vomiting, diarrhea, bright red blood per rectum, melena, or hematemesis Neurologic: negative for visual changes, syncope, or dizziness All other systems reviewed and are otherwise negative except as noted above.    Blood pressure 98/70, pulse 91, height '5\' 2"'$  (1.575 m), weight 177 lb (80.3 kg).  General appearance: alert and no distress Neck: no adenopathy, no carotid bruit, no JVD,  supple, symmetrical, trachea midline and thyroid not enlarged, symmetric, no tenderness/mass/nodules Lungs: clear to auscultation bilaterally Heart: regular rate and rhythm, S1, S2 normal, no murmur, click, rub or gallop Extremities: extremities normal, atraumatic, no cyanosis or edema Pulses: 2+ and symmetric Skin: Skin color, texture, turgor normal. No rashes or lesions Neurologic: Alert and oriented X 3, normal strength and tone. Normal symmetric reflexes. Normal coordination and gait  EKG not performed today  ASSESSMENT AND PLAN:   Acute diastolic CHF (congestive heart failure) ([place]) Ms. Salais was recently hospitalized on 12/01/2017 with shortness of breath she is found to have a left lower lobe pneumonia.  2D echo revealed normal LV function with grade 1 diastolic dysfunction.  She did diurese 5 L with Lasix and felt clinically improved and remains on 20 mg of Lasix daily which can double in the event she has more edema than usual she says that her breathing is better than has been in a while.  Claudication in peripheral vascular disease ([place]) Symptoms of claudication improved with diuresis with normal arterial Doppler studies.  Hyperlipidemia She of  hyperlipidemia total cholesterol measured on 10/21/2017 at 147 she is on fenofibrate.  Referring her to Dr. Debara Pickett for further evaluation and treatment.      Lorretta Harp MD FACP,FACC,FAHA, [facility] 01/06/2018 4:26 PM

## 2018-01-06 NOTE — Assessment & Plan Note (Signed)
She of hyperlipidemia total cholesterol measured on 10/21/2017 at 147 she is on fenofibrate.  Referring her to Dr. Rennis Golden for further evaluation and treatment.

## 2018-01-06 NOTE — Assessment & Plan Note (Signed)
Symptoms of claudication improved with diuresis with normal arterial Doppler studies.

## 2018-01-06 NOTE — Patient Instructions (Signed)
Medication Instructions: Increase Furosemide (Lasix) to 40 mg daily. May take 1/2 tab on most days and 1 full tab daily when needed.   Follow-Up: We request that you follow-up in: 6 months with an extender and in 12 months with Dr San Morelle will receive a reminder letter in the mail two months in advance. If you don't receive a letter, please call our office to schedule the follow-up appointment.  If you need a refill on your cardiac medications before your next appointment, please call your pharmacy.

## 2018-01-07 ENCOUNTER — Other Ambulatory Visit: Payer: Managed Care, Other (non HMO)

## 2018-01-07 ENCOUNTER — Encounter: Payer: Self-pay | Admitting: Emergency Medicine

## 2018-01-07 ENCOUNTER — Ambulatory Visit: Payer: Managed Care, Other (non HMO) | Admitting: Emergency Medicine

## 2018-01-07 VITALS — BP 110/70 | HR 94 | Ht 62.0 in | Wt 176.0 lb

## 2018-01-07 DIAGNOSIS — R0602 Shortness of breath: Secondary | ICD-10-CM

## 2018-01-07 DIAGNOSIS — R0609 Other forms of dyspnea: Secondary | ICD-10-CM

## 2018-01-07 DIAGNOSIS — R0600 Dyspnea, unspecified: Secondary | ICD-10-CM

## 2018-01-07 DIAGNOSIS — R9389 Abnormal findings on diagnostic imaging of other specified body structures: Secondary | ICD-10-CM | POA: Diagnosis not present

## 2018-01-07 DIAGNOSIS — R918 Other nonspecific abnormal finding of lung field: Secondary | ICD-10-CM

## 2018-01-07 NOTE — Assessment & Plan Note (Signed)
May be multifactorial.  She has been evaluated by cardiology and likely has some diastolic dysfunction, benefited from measures diuresis.  Suspect that she does also have some degree of obstructive disease.  This has been quantified and she needs pulmonary function testing.  Given her strong family history, possible liver findings on CT and our suspicion for obstruction I think she needs an alpha-1 antitrypsin screen we will perform this today.  Based on her pulmonary function testing we will decide whether bronchodilators are indicated.  Hold off on the Trelegy for now.

## 2018-01-07 NOTE — Patient Instructions (Addendum)
We will arrange for pulmonary function testing.   We will plan to repeat your CT chest in about 1 month to compare with 11/2017.  We will perform blood work today. Follow with Dr Delton Coombes in 1 month after your CT to review the results together.

## 2018-01-07 NOTE — Assessment & Plan Note (Signed)
Unclear etiology, this is a scattered nodular infiltrate in the left lower lobe and it occurred when she was having a clinical syndrome consistent with a community acquired pneumonia.  This could be infectious.  Also consider other inflammatory etiologies.  She is TB test negative last July.  Pattern is not consistent with malignancy.  We will repeat her CT scan of the chest in 1 month, look for interval resolution.  If the nodular infiltrates persist then we will have to plan further evaluation, even consider bronchoscopy

## 2018-01-07 NOTE — Progress Notes (Signed)
Subjective:    Patient ID: Caitlin Barnett, female    DOB: Aug 09, 1966, 52 y.o.   MRN: [id number]  HPI 52 year old former smoker (15 pack years), with a history of obesity, hypertension with diastolic dysfunction,  diabetes, irritable bowel syndrome, hyperlipidemia, hypothyroidism, migraines, allergic rhinitis, GERD.  She had sinus surgery performed in 2013.  She is referred today for evaluation of shortness of breath. She has had a cardiac eval with a reassuring gated Myoview 11/27/2017.  Her echocardiogram from 11/24/2017 showed intact LV function with impaired diastolic function.  An event monitor did not show any evidence of arrhythmias.    She notes that she had URI sx in January, had cough with some associated pain, dyspnea and was treated as an outpt with abx. She would temporarily improve, sputum would improve, but cough persisted. All at the same time she noted edema and some sx consistent w diastolic CHF. Admitted in late April. She clinically improved on lasix + Abx.  In retrospect she had noticed some exertional SOB with hills earlier in the Fall.  She stopped smoking in January. She was started on a trial of Trelegy, but stopped after a week. Her cough is better, her exertional tolerance is still impaired. No wheeze currently.    Review of Systems  Constitutional: Negative for fever and unexpected weight change.  HENT: Positive for sneezing and sore throat. Negative for congestion, dental problem, ear pain, nosebleeds, postnasal drip, rhinorrhea, sinus pressure and trouble swallowing.   Eyes: Negative for redness and itching.  Respiratory: Positive for cough and shortness of breath. Negative for chest tightness and wheezing.   Cardiovascular: Negative for palpitations and leg swelling.  Gastrointestinal: Negative for nausea and vomiting.  Genitourinary: Negative for dysuria.  Musculoskeletal: Negative for joint swelling.  Skin: Negative for rash.  Neurological: Negative for headaches.    Hematological: Does not bruise/bleed easily.  Psychiatric/Behavioral: Positive for dysphoric mood. The patient is nervous/anxious.     Past Medical History:  Diagnosis Date  . Diabetes mellitus without complication ([place])   . Diverticulitis   . GERD (gastroesophageal reflux disease)   . Hyperlipidemia   . IBS (irritable bowel syndrome)   . Migraine   . Pneumonia 11/2017  . Thyroid disease   . UTI (urinary tract infection)      Family History  Problem Relation Age of Onset  . Mental illness Mother        depression  . Suicidality Mother   . Alcohol abuse Father   . Kidney disease Father   . Mental illness Father        depression  . Suicidality Father   . Breast cancer Maternal Aunt        breast cancer  . Heart disease Maternal Grandmother   . Breast cancer Maternal Grandmother   . Suicidality Maternal Grandfather   . Stroke Paternal Grandmother   . Heart disease Paternal Grandmother   . Heart disease Paternal Grandfather      Social History   Socioeconomic History  . Marital status: Married    Spouse name: Not on file  . Number of children: Not on file  . Years of education: Not on file  . Highest education level: Not on file  Occupational History  . Not on file  Social Needs  . Financial resource strain: Not on file  . Food insecurity:    Worry: Not on file    Inability: Not on file  . Transportation needs:    Medical: Not on  file    Non-medical: Not on file  Tobacco Use  . Smoking status: Former Smoker    Packs/day: 0.50    Years: 30.00    Pack years: 15.00    Types: Cigarettes    Last attempt to quit: 08/24/2017    Years since quitting: 0.3  . Smokeless tobacco: Never Used  Substance and Sexual Activity  . Alcohol use: No  . Drug use: No  . Sexual activity: Not on file  Lifestyle  . Physical activity:    Days per week: Not on file    Minutes per session: Not on file  . Stress: Not on file  Relationships  . Social connections:    Talks on  phone: Not on file    Gets together: Not on file    Attends religious service: Not on file    Active member of club or organization: Not on file    Attends meetings of clubs or organizations: Not on file    Relationship status: Not on file  . Intimate partner violence:    Fear of current or ex partner: Not on file    Emotionally abused: Not on file    Physically abused: Not on file    Forced sexual activity: Not on file  Other Topics Concern  . Not on file  Social History Narrative  . Not on file   Has lived in the [place], works as an Therapist, sports, no other exposures. Her TB testing has always been negative, last was July 2018.   Allergies  Allergen Reactions  . Tetanus Toxoids Other (See Comments)    Injection site abcess  . Sulfa Antibiotics Hives    itching     Outpatient Medications Prior to Visit  Medication Sig Dispense Refill  . albuterol (PROVENTIL HFA;VENTOLIN HFA) 108 (90 Base) MCG/ACT inhaler Inhale 2 puffs into the lungs every 4 (four) hours as needed for wheezing or shortness of breath. 18 Inhaler 2  . albuterol (PROVENTIL) (2.5 MG/3ML) 0.083% nebulizer solution Take 3 mLs (2.5 mg total) by nebulization every 6 (six) hours as needed for wheezing or shortness of breath. 75 mL 12  . aspirin EC 81 MG tablet Take 81 mg by mouth daily.    . blood glucose meter kit and supplies KIT Dispense based on patient and insurance preference. Use up to four times daily as directed. (FOR ICD-9 250.00, 250.01). 1 each 0  . butalbital-acetaminophen-caffeine (FIORICET, ESGIC) 50-325-40 MG tablet Take 1/2 to 1 tablet every 4 hours if needed for headache 20 tablet 0  . cetirizine (ZYRTEC) 10 MG tablet Take 10 mg by mouth daily.    . Cholecalciferol 1000 units capsule Take 1 tablet by mouth daily.    . Cinnamon 500 MG TABS Take 2 capsules by mouth 2 (two) times daily.    . cyanocobalamin (,VITAMIN B-12,) 1000 MCG/ML injection Inject 1,000 mcg into the muscle.  11  . estrogen,  conjugated,-medroxyprogesterone (PREMPRO) 0.45-1.5 MG tablet TAKE 1 TABLET BY MOUTH EVERY DAY 84 tablet 3  . fenofibrate (TRICOR) 145 MG tablet Take 1 tablet (145 mg total) by mouth daily. 90 tablet 1  . fluticasone (FLONASE) 50 MCG/ACT nasal spray Place 1 spray into both nostrils daily. 16 g 2  . Fluticasone-Umeclidin-Vilant (TRELEGY ELLIPTA) 100-62.5-25 MCG/INH AEPB Inhale 100 mcg into the lungs daily. 60 each 6  . furosemide (LASIX) 40 MG tablet Take 40 mg (1 tab) by mouth daily. May take 1/2 tab on most days and 1 full tab daily when  needed. 30 tablet 6  . ibuprofen (ADVIL,MOTRIN) 200 MG tablet Take 800 mg by mouth as needed for moderate pain.    Marland Kitchen levothyroxine (SYNTHROID, LEVOTHROID) 125 MCG tablet Take 1 tablet every morning on an empty stomach with only water for 30 minutes 90 tablet 1  . Magnesium 250 MG TABS Take 250 mg by mouth daily.    . metFORMIN (GLUCOPHAGE XR) 500 MG 24 hr tablet Take 2 tablets 2 x / day for Diabetes 360 tablet 1  . montelukast (SINGULAIR) 10 MG tablet Take 1 tablet daily for Allergies 90 tablet 3  . omeprazole (PRILOSEC) 40 MG capsule Take 1 capsule daily for acid indigestion 90 capsule 1  . potassium chloride (K-DUR) 10 MEQ tablet Take 1 tablet (10 mEq total) by mouth every other day. 30 tablet 0  . pregabalin (LYRICA) 75 MG capsule Take 1 capsule 3 x / day for neuropathy pains 270 capsule 1  . promethazine-dextromethorphan (PROMETHAZINE-DM) 6.25-15 MG/5ML syrup Take 5 mLs by mouth 4 (four) times daily as needed for cough. 240 mL 1   No facility-administered medications prior to visit.         Objective:   Physical Exam  Vitals:   01/07/18 1440  BP: 110/70  Pulse: 94  SpO2: 96%  Weight: 176 lb (79.8 kg)  Height: _0  (1.575 m)   Gen: Pleasant, overwt, in no distress,  normal affect  ENT: No lesions,  mouth clear,  oropharynx clear, no postnasal drip  Neck: No JVD, no stridor  Lungs: No use of accessory muscles, clear B   Cardiovascular: RRR,  heart sounds normal, no murmur or gallops, no peripheral edema  Musculoskeletal: No deformities, no cyanosis or clubbing  Neuro: alert, non focal  Skin: Warm, no lesions or rash     Assessment & Plan:  Dyspnea on exertion May be multifactorial.  She has been evaluated by cardiology and likely has some diastolic dysfunction, benefited from measures diuresis.  Suspect that she does also have some degree of obstructive disease.  This has been quantified and she needs pulmonary function testing.  Given her strong family history, possible liver findings on CT and our suspicion for obstruction I think she needs an alpha-1 antitrypsin screen we will perform this today.  Based on her pulmonary function testing we will decide whether bronchodilators are indicated.  Hold off on the Trelegy for now.  Abnormal CT of the chest Unclear etiology, this is a scattered nodular infiltrate in the left lower lobe and it occurred when she was having a clinical syndrome consistent with a community acquired pneumonia.  This could be infectious.  Also consider other inflammatory etiologies.  She is TB test negative last July.  Pattern is not consistent with malignancy.  We will repeat her CT scan of the chest in 1 month, look for interval resolution.  If the nodular infiltrates persist then we will have to plan further evaluation, even consider bronchoscopy  Baltazar Apo, MD, PhD 01/07/2018, 3:20 PM [facility] [phone] or if no answer [phone]

## 2018-01-12 LAB — ALPHA-1 ANTITRYPSIN PHENOTYPE: A-1 Antitrypsin, Ser: 147 mg/dL (ref 83–199)

## 2018-01-16 ENCOUNTER — Other Ambulatory Visit: Payer: Self-pay | Admitting: *Deleted

## 2018-01-16 ENCOUNTER — Encounter: Payer: Self-pay | Admitting: Internal Medicine

## 2018-01-16 DIAGNOSIS — E1140 Type 2 diabetes mellitus with diabetic neuropathy, unspecified: Secondary | ICD-10-CM

## 2018-01-16 MED ORDER — METFORMIN HCL ER 500 MG PO TB24: ORAL_TABLET | ORAL | 1 refills | Status: DC

## 2018-01-28 ENCOUNTER — Encounter: Payer: Self-pay | Admitting: Internal Medicine

## 2018-01-28 ENCOUNTER — Ambulatory Visit: Payer: Managed Care, Other (non HMO) | Admitting: Internal Medicine

## 2018-01-28 VITALS — BP 102/70 | HR 88 | Temp 97.6°F | Resp 16 | Ht 62.0 in | Wt 172.0 lb

## 2018-01-28 DIAGNOSIS — E039 Hypothyroidism, unspecified: Secondary | ICD-10-CM | POA: Diagnosis not present

## 2018-01-28 DIAGNOSIS — Z79899 Other long term (current) drug therapy: Secondary | ICD-10-CM | POA: Diagnosis not present

## 2018-01-28 DIAGNOSIS — E782 Mixed hyperlipidemia: Secondary | ICD-10-CM

## 2018-01-28 DIAGNOSIS — E1140 Type 2 diabetes mellitus with diabetic neuropathy, unspecified: Secondary | ICD-10-CM | POA: Diagnosis not present

## 2018-01-28 DIAGNOSIS — R030 Elevated blood-pressure reading, without diagnosis of hypertension: Secondary | ICD-10-CM | POA: Diagnosis not present

## 2018-01-28 DIAGNOSIS — E559 Vitamin D deficiency, unspecified: Secondary | ICD-10-CM

## 2018-01-28 NOTE — Patient Instructions (Signed)

## 2018-01-28 NOTE — Progress Notes (Signed)
This very nice 52 y.o. MWF presents for 3 month follow up with HTN, HLD, T2_DM and Vitamin D Deficiency.  Patient has GERD controlled w/her PPI's and has failed attempts to wean.      Patient failed treatment w/Z-Pak and Augmentin and was hospitalized 4/22-26/2019 with a LLL CAP. During hospitalization and aggessive IVF rehydration, she developed dyspnea and she subsequently did require diuresis w/Lasix.  Cardiac Echo was reported Normal. Patient has 30+ yr smoking hx ~ 1/2 ppd and alleges stopping in Jan 2019.     Patient is treated for HTN & BP has been controlled at home. Today's  . Patient has had no complaints of any cardiac type chest pain, palpitations, dyspnea / orthopnea / PND, dizziness, claudication, or dependent edema.     Hyperlipidemia is controlled with diet & meds. Patient denies myalgias or other med SE's. Last Lipids were  Lab Results  Component Value Date   CHOL 147 10/21/2017   HDL 40 (L) 10/21/2017   LDLCALC 59 10/21/2017   TRIG 397 (H) 10/21/2017   CHOLHDL 3.7 10/21/2017      Also, the patient has history of remote Gestational DM (1993/95) and then in 2016 was dx'd w/ T2_NIDDM & started on Metformin.  She has had no symptoms of reactive hypoglycemia, diabetic polys or visual blurring, but does have painful paresthesias of her soles.  Last A1c was not at goal: Lab Results  Component Value Date   HGBA1C 7.3 (H) 12/01/2017      Patient has been on Thyroid replacement since 10 (age 89). Further, the patient also has history of Vitamin D Deficiency  ("8"/2018) and supplements vitamin D without any suspected side-effects. Last vitamin D was near goal:  Lab Results  Component Value Date   VD25OH 55 10/21/2017   Current Outpatient Medications on File Prior to Visit  Medication Sig  . albuterol (PROVENTIL HFA;VENTOLIN HFA) 108 (90 Base) MCG/ACT inhaler Inhale 2 puffs into the lungs every 4 (four) hours as needed for wheezing or shortness of breath.  Marland Kitchen albuterol  (PROVENTIL) (2.5 MG/3ML) 0.083% nebulizer solution Take 3 mLs (2.5 mg total) by nebulization every 6 (six) hours as needed for wheezing or shortness of breath.  Marland Kitchen aspirin EC 81 MG tablet Take 81 mg by mouth daily.  . blood glucose meter kit and supplies KIT Dispense based on patient and insurance preference. Use up to four times daily as directed. (FOR ICD-9 250.00, 250.01).  . butalbital-acetaminophen-caffeine (FIORICET, ESGIC) 50-325-40 MG tablet Take 1/2 to 1 tablet every 4 hours if needed for headache  . cetirizine (ZYRTEC) 10 MG tablet Take 10 mg by mouth daily.  . Cholecalciferol 1000 units capsule Take 1 tablet by mouth daily.  . Cinnamon 500 MG TABS Take 2 capsules by mouth 2 (two) times daily.  . cyanocobalamin (,VITAMIN B-12,) 1000 MCG/ML injection Inject 1,000 mcg into the muscle.  . estrogen, conjugated,-medroxyprogesterone (PREMPRO) 0.45-1.5 MG tablet TAKE 1 TABLET BY MOUTH EVERY DAY  . fenofibrate (TRICOR) 145 MG tablet Take 1 tablet (145 mg total) by mouth daily.  . fluticasone (FLONASE) 50 MCG/ACT nasal spray Place 1 spray into both nostrils daily.  . Fluticasone-Umeclidin-Vilant (TRELEGY ELLIPTA) 100-62.5-25 MCG/INH AEPB Inhale 100 mcg into the lungs daily.  . furosemide (LASIX) 40 MG tablet Take 40 mg (1 tab) by mouth daily. May take 1/2 tab on most days and 1 full tab daily when needed.  Marland Kitchen ibuprofen (ADVIL,MOTRIN) 200 MG tablet Take 800 mg by mouth as  needed for moderate pain.  Marland Kitchen levothyroxine (SYNTHROID, LEVOTHROID) 125 MCG tablet Take 1 tablet every morning on an empty stomach with only water for 30 minutes  . Magnesium 250 MG TABS Take 250 mg by mouth daily.  . metFORMIN (GLUCOPHAGE XR) 500 MG 24 hr tablet Take 2 tablets 2 x / day for Diabetes  . montelukast (SINGULAIR) 10 MG tablet Take 1 tablet daily for Allergies  . omeprazole (PRILOSEC) 40 MG capsule Take 1 capsule daily for acid indigestion  . potassium chloride (K-DUR) 10 MEQ tablet Take 1 tablet (10 mEq total) by mouth  every other day.  . pregabalin (LYRICA) 75 MG capsule Take 1 capsule 3 x / day for neuropathy pains  . promethazine-dextromethorphan (PROMETHAZINE-DM) 6.25-15 MG/5ML syrup Take 5 mLs by mouth 4 (four) times daily as needed for cough.   No current facility-administered medications on file prior to visit.    Allergies  Allergen Reactions  . Tetanus Toxoids Other (See Comments)    Injection site abcess  . Sulfa Antibiotics Hives    itching   PMHx:   Past Medical History:  Diagnosis Date  . Diabetes mellitus without complication ([place])   . Diverticulitis   . GERD (gastroesophageal reflux disease)   . Hyperlipidemia   . IBS (irritable bowel syndrome)   . Migraine   . Pneumonia 11/2017  . Thyroid disease   . UTI (urinary tract infection)    Immunization History  Administered Date(s) Administered  . Influenza Inj Mdck Quad With Preservative 07/22/2017   Past Surgical History:  Procedure Laterality Date  . CESAREAN SECTION  1993  . cystoscopies     multiple  . FUNCTIONAL ENDOSCOPIC SINUS SURGERY  2003  . LAPAROSCOPIC OVARIAN CYSTECTOMY  1983  . TONSILLECTOMY  1973  . TYMPANOPLASTY W/ MASTOIDECTOMY Left 2016 & 2017   FHx:    Reviewed / unchanged  SHx:    Reviewed / unchanged  Systems Review:  Constitutional: Denies fever, chills, wt changes, headaches, insomnia, fatigue, night sweats, change in appetite. Eyes: Denies redness, blurred vision, diplopia, discharge, itchy, watery eyes.  ENT: Denies discharge, congestion, post nasal drip, epistaxis, sore throat, earache, hearing loss, dental pain, tinnitus, vertigo, sinus pain, snoring.  CV: Denies chest pain, palpitations, irregular heartbeat, syncope, dyspnea, diaphoresis, orthopnea, PND, claudication or edema. Respiratory: denies cough, dyspnea, DOE, pleurisy, hoarseness, laryngitis, wheezing.  Gastrointestinal: Denies dysphagia, odynophagia, heartburn, reflux, water brash, abdominal pain or cramps, nausea, vomiting, bloating,  diarrhea, constipation, hematemesis, melena, hematochezia  or hemorrhoids. Genitourinary: Denies dysuria, frequency, urgency, nocturia, hesitancy, discharge, hematuria or flank pain. Musculoskeletal: Denies arthralgias, myalgias, stiffness, jt. swelling, pain, limping or strain/sprain.  Skin: Denies pruritus, rash, hives, warts, acne, eczema or change in skin lesion(s). Neuro: No weakness, tremor, incoordination, spasms, paresthesia or pain. Psychiatric: Denies confusion, memory loss or sensory loss. Endo: Denies change in weight, skin or hair change.  Heme/Lymph: No excessive bleeding, bruising or enlarged lymph nodes.  Physical Exam  There were no vitals taken for this visit.  Appears  well nourished, well groomed  and in no distress.  Eyes: PERRLA, EOMs, conjunctiva no swelling or erythema. Sinuses: No frontal/maxillary tenderness ENT/Mouth: EAC's clear, TM's nl w/o erythema, bulging. Nares clear w/o erythema, swelling, exudates. Oropharynx clear without erythema or exudates. Oral hygiene is good. Tongue normal, non obstructing. Hearing intact.  Neck: Supple. Thyroid not palpable. Car 2+/2+ without bruits, nodes or JVD. Chest: Respirations nl with BS clear & equal w/o rales, rhonchi, wheezing or stridor.  Cor: Heart sounds normal  w/ regular rate and rhythm without sig. murmurs, gallops, clicks or rubs. Peripheral pulses normal and equal  without edema.  Abdomen: Soft & bowel sounds normal. Non-tender w/o guarding, rebound, hernias, masses or organomegaly.  Lymphatics: Unremarkable.  Musculoskeletal: Full ROM all peripheral extremities, joint stability, 5/5 strength and normal gait.  Skin: Warm, dry without exposed rashes, lesions or ecchymosis apparent.  Neuro: Cranial nerves intact, reflexes equal bilaterally. Sensory-motor testing grossly intact. Tendon reflexes grossly intact.  Pysch: Alert & oriented x 3.  Insight and judgement nl & appropriate. No ideations.  Assessment and  Plan:  1. Elevated BP without diagnosis of hypertension  - Continue medication, monitor blood pressure at home.  - Continue DASH diet.  Reminder to go to the ER if any CP,  SOB, nausea, dizziness, severe HA, changes vision/speech.  - CBC with Differential/Platelet - COMPLETE METABOLIC PANEL WITH GFR - Magnesium - TSH  2. Hyperlipidemia, mixed  - Continue diet/meds, exercise,& lifestyle modifications.  - Continue monitor periodic cholesterol/liver & renal functions   - Lipid panel - TSH  3. Type 2 diabetes mellitus with diabetic neuropathy, without long-term current use of insulin (HCC)  - Continue diet, exercise, lifestyle modifications.  - Monitor appropriate labs.  - Hemoglobin A1c - Insulin, random  4. Vitamin D deficiency  - Continue supplementation.   - VITAMIN D 25 Hydroxyl  5. Hypothyroidism  - TSH  6. Medication management  - CBC with Differential/Platelet - COMPLETE METABOLIC PANEL WITH GFR - Magnesium - Lipid panel - TSH - Hemoglobin A1c - Insulin, random - VITAMIN D 25 Hydroxyl      Discussed  regular exercise, BP monitoring, weight control to achieve/maintain BMI less than 25 and discussed med and SE's. Recommended labs to assess and monitor clinical status with further disposition pending results of labs. Over 30 minutes of exam, counseling, chart review was performed.

## 2018-01-29 ENCOUNTER — Other Ambulatory Visit: Payer: Self-pay | Admitting: Internal Medicine

## 2018-01-29 DIAGNOSIS — R945 Abnormal results of liver function studies: Secondary | ICD-10-CM

## 2018-01-29 DIAGNOSIS — R7989 Other specified abnormal findings of blood chemistry: Secondary | ICD-10-CM

## 2018-01-29 LAB — TSH: TSH: 0.56 mIU/L

## 2018-01-29 LAB — COMPLETE METABOLIC PANEL WITH GFR
AG Ratio: 1.9 (calc) (ref 1.0–2.5)
ALT: 53 U/L — ABNORMAL HIGH (ref 6–29)
AST: 42 U/L — ABNORMAL HIGH (ref 10–35)
Albumin: 4.6 g/dL (ref 3.6–5.1)
Alkaline phosphatase (APISO): 83 U/L (ref 33–130)
BUN: 17 mg/dL (ref 7–25)
CO2: 28 mmol/L (ref 20–32)
Calcium: 10.4 mg/dL (ref 8.6–10.4)
Chloride: 102 mmol/L (ref 98–110)
Creat: 0.79 mg/dL (ref 0.50–1.05)
GFR, Est African American: 101 mL/min/{1.73_m2} (ref >=60)
GFR, Est Non African American: 87 mL/min/{1.73_m2} (ref >=60)
Globulin: 2.4 g/dL (calc) (ref 1.9–3.7)
Glucose, Bld: 127 mg/dL — ABNORMAL HIGH (ref 65–99)
Potassium: 4.2 mmol/L (ref 3.5–5.3)
Sodium: 139 mmol/L (ref 135–146)
Total Bilirubin: 0.4 mg/dL (ref 0.2–1.2)
Total Protein: 7 g/dL (ref 6.1–8.1)

## 2018-01-29 LAB — CBC WITH DIFFERENTIAL/PLATELET
Basophils Absolute: 59 cells/uL (ref 0–200)
Basophils Relative: 0.9 %
Eosinophils Absolute: 169 cells/uL (ref 15–500)
Eosinophils Relative: 2.6 %
HCT: 39.4 % (ref 35.0–45.0)
Hemoglobin: 13.7 g/dL (ref 11.7–15.5)
Lymphs Abs: 2184 cells/uL (ref 850–3900)
MCH: 30.9 pg (ref 27.0–33.0)
MCHC: 34.8 g/dL (ref 32.0–36.0)
MCV: 88.7 fL (ref 80.0–100.0)
MPV: 10.6 fL (ref 7.5–12.5)
Monocytes Relative: 7 %
Neutro Abs: 3634 cells/uL (ref 1500–7800)
Neutrophils Relative %: 55.9 %
Platelets: 352 10*3/uL (ref 140–400)
RBC: 4.44 10*6/uL (ref 3.80–5.10)
RDW: 12.3 % (ref 11.0–15.0)
Total Lymphocyte: 33.6 %
WBC mixed population: 455 cells/uL (ref 200–950)
WBC: 6.5 10*3/uL (ref 3.8–10.8)

## 2018-01-29 LAB — LIPID PANEL
Cholesterol: 156 mg/dL (ref <200)
HDL: 35 mg/dL — ABNORMAL LOW (ref >50)
Non-HDL Cholesterol (Calc): 121 mg/dL (calc) (ref <130)
Total CHOL/HDL Ratio: 4.5 (calc) (ref <5.0)
Triglycerides: 437 mg/dL — ABNORMAL HIGH (ref <150)

## 2018-01-29 LAB — MAGNESIUM: Magnesium: 1.9 mg/dL (ref 1.5–2.5)

## 2018-01-29 LAB — HEMOGLOBIN A1C
Hgb A1c MFr Bld: 7.5 % of total Hgb — ABNORMAL HIGH (ref <5.7)
Mean Plasma Glucose: 169 (calc)
eAG (mmol/L): 9.3 (calc)

## 2018-01-29 LAB — VITAMIN D 25 HYDROXY (VIT D DEFICIENCY, FRACTURES): Vit D, 25-Hydroxy: 46 ng/mL (ref 30–100)

## 2018-01-29 LAB — INSULIN, RANDOM: Insulin: 12.9 u[IU]/mL (ref 2.0–19.6)

## 2018-01-31 ENCOUNTER — Encounter: Payer: Self-pay | Admitting: Internal Medicine

## 2018-02-02 NOTE — Progress Notes (Deleted)
HPI: Follow-up diastolic congestive heart failure.  Previously followed by Dr. Gwenlyn Found.  Holter monitor March 2019 showed sinus rhythm with rare PAC and PVC. Echo April 2019 showed normal LV function, mild diastolic dysfunction.  Event monitor April 2019 showed sinus to sinus tachycardia.  Nuclear study April 2019 showed ejection fraction 73% and no ischemia or infarction.  ABIs April 2019 normal.  Since last seen  Current Outpatient Medications  Medication Sig Dispense Refill  . albuterol (PROVENTIL HFA;VENTOLIN HFA) 108 (90 Base) MCG/ACT inhaler Inhale 2 puffs into the lungs every 4 (four) hours as needed for wheezing or shortness of breath. 18 Inhaler 2  . albuterol (PROVENTIL) (2.5 MG/3ML) 0.083% nebulizer solution Take 3 mLs (2.5 mg total) by nebulization every 6 (six) hours as needed for wheezing or shortness of breath. 75 mL 12  . aspirin EC 81 MG tablet Take 81 mg by mouth daily.    . blood glucose meter kit and supplies KIT Dispense based on patient and insurance preference. Use up to four times daily as directed. (FOR ICD-9 250.00, 250.01). 1 each 0  . butalbital-acetaminophen-caffeine (FIORICET, ESGIC) 50-325-40 MG tablet Take 1/2 to 1 tablet every 4 hours if needed for headache 20 tablet 0  . cetirizine (ZYRTEC) 10 MG tablet Take 10 mg by mouth daily.    . Cholecalciferol 1000 units capsule Take 1 tablet by mouth daily.    . Cinnamon 500 MG TABS Take 2 capsules by mouth 2 (two) times daily.    . cyanocobalamin (,VITAMIN B-12,) 1000 MCG/ML injection Inject 1,000 mcg into the muscle.  11  . estrogen, conjugated,-medroxyprogesterone (PREMPRO) 0.45-1.5 MG tablet TAKE 1 TABLET BY MOUTH EVERY DAY 84 tablet 3  . fenofibrate (TRICOR) 145 MG tablet Take 1 tablet (145 mg total) by mouth daily. 90 tablet 1  . fluticasone (FLONASE) 50 MCG/ACT nasal spray Place 1 spray into both nostrils daily. 16 g 2  . Fluticasone-Umeclidin-Vilant (TRELEGY ELLIPTA) 100-62.5-25 MCG/INH AEPB Inhale 100 mcg  into the lungs daily. 60 each 6  . furosemide (LASIX) 40 MG tablet Take 40 mg (1 tab) by mouth daily. May take 1/2 tab on most days and 1 full tab daily when needed. 30 tablet 6  . ibuprofen (ADVIL,MOTRIN) 200 MG tablet Take 800 mg by mouth as needed for moderate pain.    Marland Kitchen levothyroxine (SYNTHROID, LEVOTHROID) 125 MCG tablet Take 1 tablet every morning on an empty stomach with only water for 30 minutes 90 tablet 1  . Magnesium 250 MG TABS Take 250 mg by mouth daily.    . metFORMIN (GLUCOPHAGE XR) 500 MG 24 hr tablet Take 2 tablets 2 x / day for Diabetes 360 tablet 1  . montelukast (SINGULAIR) 10 MG tablet Take 1 tablet daily for Allergies 90 tablet 3  . omeprazole (PRILOSEC) 40 MG capsule Take 1 capsule daily for acid indigestion 90 capsule 1  . potassium chloride (K-DUR) 10 MEQ tablet Take 1 tablet (10 mEq total) by mouth every other day. 30 tablet 0  . pregabalin (LYRICA) 75 MG capsule Take 1 capsule 3 x / day for neuropathy pains 270 capsule 1   No current facility-administered medications for this visit.      Past Medical History:  Diagnosis Date  . Diabetes mellitus without complication ([place])   . Diverticulitis   . GERD (gastroesophageal reflux disease)   . Hyperlipidemia   . IBS (irritable bowel syndrome)   . Migraine   . Pneumonia 11/2017  . Thyroid disease   .  UTI (urinary tract infection)     Past Surgical History:  Procedure Laterality Date  . CESAREAN SECTION  1993  . cystoscopies     multiple  . FUNCTIONAL ENDOSCOPIC SINUS SURGERY  2003  . LAPAROSCOPIC OVARIAN CYSTECTOMY  1983  . TONSILLECTOMY  1973  . TYMPANOPLASTY W/ MASTOIDECTOMY Left 2016 & 2017    Social History   Socioeconomic History  . Marital status: Married    Spouse name: Not on file  . Number of children: Not on file  . Years of education: Not on file  . Highest education level: Not on file  Occupational History  . Not on file  Social Needs  . Financial resource strain: Not on file  . Food  insecurity:    Worry: Not on file    Inability: Not on file  . Transportation needs:    Medical: Not on file    Non-medical: Not on file  Tobacco Use  . Smoking status: Former Smoker    Packs/day: 0.50    Years: 30.00    Pack years: 15.00    Types: Cigarettes    Last attempt to quit: 08/24/2017    Years since quitting: 0.4  . Smokeless tobacco: Never Used  Substance and Sexual Activity  . Alcohol use: No  . Drug use: No  . Sexual activity: Not on file  Lifestyle  . Physical activity:    Days per week: Not on file    Minutes per session: Not on file  . Stress: Not on file  Relationships  . Social connections:    Talks on phone: Not on file    Gets together: Not on file    Attends religious service: Not on file    Active member of club or organization: Not on file    Attends meetings of clubs or organizations: Not on file    Relationship status: Not on file  . Intimate partner violence:    Fear of current or ex partner: Not on file    Emotionally abused: Not on file    Physically abused: Not on file    Forced sexual activity: Not on file  Other Topics Concern  . Not on file  Social History Narrative  . Not on file    Family History  Problem Relation Age of Onset  . Mental illness Mother        depression  . Suicidality Mother   . Alcohol abuse Father   . Kidney disease Father   . Mental illness Father        depression  . Suicidality Father   . Breast cancer Maternal Aunt        breast cancer  . Heart disease Maternal Grandmother   . Breast cancer Maternal Grandmother   . Suicidality Maternal Grandfather   . Stroke Paternal Grandmother   . Heart disease Paternal Grandmother   . Heart disease Paternal Grandfather     ROS: no fevers or chills, productive cough, hemoptysis, dysphasia, odynophagia, melena, hematochezia, dysuria, hematuria, rash, seizure activity, orthopnea, PND, pedal edema, claudication. Remaining systems are negative.  Physical  Exam: Well-developed well-nourished in no acute distress.  Skin is warm and dry.  HEENT is normal.  Neck is supple.  Chest is clear to auscultation with normal expansion.  Cardiovascular exam is regular rate and rhythm.  Abdominal exam nontender or distended. No masses palpated. Extremities show no edema. neuro grossly intact  ECG- personally reviewed  A/P  1  Kirk Ruths, MD

## 2018-02-08 ENCOUNTER — Other Ambulatory Visit: Payer: Self-pay | Admitting: Internal Medicine

## 2018-02-08 DIAGNOSIS — E039 Hypothyroidism, unspecified: Secondary | ICD-10-CM

## 2018-02-09 ENCOUNTER — Inpatient Hospital Stay: Admission: RE | Admit: 2018-02-09 | Payer: Managed Care, Other (non HMO) | Source: Ambulatory Visit

## 2018-02-10 ENCOUNTER — Ambulatory Visit: Payer: Managed Care, Other (non HMO) | Admitting: Cardiology

## 2018-03-02 ENCOUNTER — Ambulatory Visit (INDEPENDENT_AMBULATORY_CARE_PROVIDER_SITE_OTHER)
Admission: RE | Admit: 2018-03-02 | Discharge: 2018-03-02 | Disposition: A | Discharge status: Home / Self Care | Payer: Managed Care, Other (non HMO) | Source: Ambulatory Visit | Attending: Emergency Medicine | Admitting: Emergency Medicine

## 2018-03-02 DIAGNOSIS — R918 Other nonspecific abnormal finding of lung field: Secondary | ICD-10-CM

## 2018-03-03 ENCOUNTER — Ambulatory Visit: Payer: Managed Care, Other (non HMO) | Admitting: Emergency Medicine

## 2018-03-03 ENCOUNTER — Encounter: Payer: Self-pay | Admitting: Emergency Medicine

## 2018-03-03 ENCOUNTER — Ambulatory Visit (INDEPENDENT_AMBULATORY_CARE_PROVIDER_SITE_OTHER): Payer: Managed Care, Other (non HMO) | Admitting: Emergency Medicine

## 2018-03-03 DIAGNOSIS — R9389 Abnormal findings on diagnostic imaging of other specified body structures: Secondary | ICD-10-CM | POA: Diagnosis not present

## 2018-03-03 DIAGNOSIS — R0609 Other forms of dyspnea: Secondary | ICD-10-CM | POA: Diagnosis not present

## 2018-03-03 DIAGNOSIS — R0602 Shortness of breath: Secondary | ICD-10-CM

## 2018-03-03 DIAGNOSIS — R0600 Dyspnea, unspecified: Secondary | ICD-10-CM

## 2018-03-03 LAB — PULMONARY FUNCTION TEST
DL/VA % pred: 105 %
DL/VA: 4.72 ml/min/mmHg/L
DLCO cor % pred: 91 %
DLCO cor: 19.02 ml/min/mmHg
DLCO unc % pred: 91 %
DLCO unc: 19.19 ml/min/mmHg
FEF 25-75 Post: 3.15 L/sec
FEF 25-75 Pre: 2.33 L/sec
FEF2575-%Change-Post: 35 %
FEF2575-%Pred-Post: 122 %
FEF2575-%Pred-Pre: 90 %
FEV1-%Change-Post: 7 %
FEV1-%Pred-Post: 95 %
FEV1-%Pred-Pre: 88 %
FEV1-Post: 2.43 L
FEV1-Pre: 2.27 L
FEV1FVC-%Change-Post: 1 %
FEV1FVC-%Pred-Pre: 102 %
FEV6-%Change-Post: 5 %
FEV6-%Pred-Post: 92 %
FEV6-%Pred-Pre: 87 %
FEV6-Post: 2.9 L
FEV6-Pre: 2.75 L
FEV6FVC-%Pred-Post: 103 %
FEV6FVC-%Pred-Pre: 103 %
FVC-%Change-Post: 5 %
FVC-%Pred-Post: 90 %
FVC-%Pred-Pre: 85 %
FVC-Post: 2.9 L
FVC-Pre: 2.75 L
Post FEV1/FVC ratio: 84 %
Post FEV6/FVC ratio: 100 %
Pre FEV1/FVC ratio: 82 %
Pre FEV6/FVC Ratio: 100 %
RV % pred: 117 %
RV: 1.98 L
TLC % pred: 98 %
TLC: 4.61 L

## 2018-03-03 NOTE — Patient Instructions (Addendum)
Your CT chest and pulmonary function testing are both reassuring.  We should not need to start any scheduled inhaled medication.  You can keep albuterol available to use 2 puffs if needed for shortness of breath Continue your lasix as managed by Drs Allyson SabalBerry and Oneta RackMcKeown.  Follow with Dr Delton CoombesByrum as needed for any changes in your breathing

## 2018-03-03 NOTE — Assessment & Plan Note (Signed)
Inflammatory changes noted on her CT scan have all resolved.  I do not believe there is any other intervention here that needs to be made.

## 2018-03-03 NOTE — Progress Notes (Signed)
PFT done today. 

## 2018-03-03 NOTE — Progress Notes (Signed)
Subjective:    Patient ID: Caitlin Barnett, female    DOB: 1966-07-17, 52 y.o.   MRN: [id number]  HPI 52 year old former smoker (15 pack years), with a history of obesity, hypertension with diastolic dysfunction,  diabetes, irritable bowel syndrome, hyperlipidemia, hypothyroidism, migraines, allergic rhinitis, GERD.  She had sinus surgery performed in 2013.  She is referred today for evaluation of shortness of breath. She has had a cardiac eval with a reassuring gated Myoview 11/27/2017.  Her echocardiogram from 11/24/2017 showed intact LV function with impaired diastolic function.  An event monitor did not show any evidence of arrhythmias.    She notes that she had URI sx in January, had cough with some associated pain, dyspnea and was treated as an outpt with abx. She would temporarily improve, sputum would improve, but cough persisted. All at the same time she noted edema and some sx consistent w diastolic CHF. Admitted in late April. She clinically improved on lasix + Abx.  In retrospect she had noticed some exertional SOB with hills earlier in the Fall.  She stopped smoking in January. She was started on a trial of Trelegy, but stopped after a week. Her cough is better, her exertional tolerance is still impaired. No wheeze currently.   ROV 03/03/18 --patient follows up today for further evaluation of her shortness of breath and for abnormal CT scan of the chest with some scattered nodular infiltrate.  She underwent pulmonary function testing today that I have reviewed.  This shows normal airflows with a grossly normal flow volume loop, significant bronchodilator change only on FEF 25 to 75%.  Her lung volumes are normal.  Her diffusion capacity is normal. Alpha-1 antitrypsin testing MM.   Repeat CT scan of her chest was done on 03/02/2018 which I have reviewed.  This shows complete resolution of her left lower lobe airspace disease with some slight residual linear scarring and subsegmental atelectasis.       Review of Systems  Constitutional: Negative for fever and unexpected weight change.  HENT: Positive for sneezing and sore throat. Negative for congestion, dental problem, ear pain, nosebleeds, postnasal drip, rhinorrhea, sinus pressure and trouble swallowing.   Eyes: Negative for redness and itching.  Respiratory: Positive for cough and shortness of breath. Negative for chest tightness and wheezing.   Cardiovascular: Negative for palpitations and leg swelling.  Gastrointestinal: Negative for nausea and vomiting.  Genitourinary: Negative for dysuria.  Musculoskeletal: Negative for joint swelling.  Skin: Negative for rash.  Neurological: Negative for headaches.  Hematological: Does not bruise/bleed easily.  Psychiatric/Behavioral: Positive for dysphoric mood. The patient is nervous/anxious.     Past Medical History:  Diagnosis Date  . Diabetes mellitus without complication ([place])   . Diverticulitis   . GERD (gastroesophageal reflux disease)   . Hyperlipidemia   . IBS (irritable bowel syndrome)   . Migraine   . Pneumonia 11/2017  . Thyroid disease   . UTI (urinary tract infection)      Family History  Problem Relation Age of Onset  . Mental illness Mother        depression  . Suicidality Mother   . Alcohol abuse Father   . Kidney disease Father   . Mental illness Father        depression  . Suicidality Father   . Breast cancer Maternal Aunt        breast cancer  . Heart disease Maternal Grandmother   . Breast cancer Maternal Grandmother   . Suicidality Maternal Grandfather   .  Stroke Paternal Grandmother   . Heart disease Paternal Grandmother   . Heart disease Paternal Grandfather      Social History   Socioeconomic History  . Marital status: Married    Spouse name: Not on file  . Number of children: Not on file  . Years of education: Not on file  . Highest education level: Not on file  Occupational History  . Not on file  Social Needs  . Financial  resource strain: Not on file  . Food insecurity:    Worry: Not on file    Inability: Not on file  . Transportation needs:    Medical: Not on file    Non-medical: Not on file  Tobacco Use  . Smoking status: Former Smoker    Packs/day: 0.50    Years: 30.00    Pack years: 15.00    Types: Cigarettes    Last attempt to quit: 08/24/2017    Years since quitting: 0.5  . Smokeless tobacco: Never Used  Substance and Sexual Activity  . Alcohol use: No  . Drug use: No  . Sexual activity: Not on file  Lifestyle  . Physical activity:    Days per week: Not on file    Minutes per session: Not on file  . Stress: Not on file  Relationships  . Social connections:    Talks on phone: Not on file    Gets together: Not on file    Attends religious service: Not on file    Active member of club or organization: Not on file    Attends meetings of clubs or organizations: Not on file    Relationship status: Not on file  . Intimate partner violence:    Fear of current or ex partner: Not on file    Emotionally abused: Not on file    Physically abused: Not on file    Forced sexual activity: Not on file  Other Topics Concern  . Not on file  Social History Narrative  . Not on file   Has lived in the [place], works as an Therapist, sports, no other exposures. Her TB testing has always been negative, last was July 2018.   Allergies  Allergen Reactions  . Tetanus Toxoids Other (See Comments)    Injection site abcess  . Sulfa Antibiotics Hives    itching     Outpatient Medications Prior to Visit  Medication Sig Dispense Refill  . albuterol (PROVENTIL HFA;VENTOLIN HFA) 108 (90 Base) MCG/ACT inhaler Inhale 2 puffs into the lungs every 4 (four) hours as needed for wheezing or shortness of breath. 18 Inhaler 2  . albuterol (PROVENTIL) (2.5 MG/3ML) 0.083% nebulizer solution Take 3 mLs (2.5 mg total) by nebulization every 6 (six) hours as needed for wheezing or shortness of breath. 75 mL 12  . aspirin EC 81 MG tablet  Take 81 mg by mouth daily.    . blood glucose meter kit and supplies KIT Dispense based on patient and insurance preference. Use up to four times daily as directed. (FOR ICD-9 250.00, 250.01). 1 each 0  . butalbital-acetaminophen-caffeine (FIORICET, ESGIC) 50-325-40 MG tablet Take 1/2 to 1 tablet every 4 hours if needed for headache 20 tablet 0  . cetirizine (ZYRTEC) 10 MG tablet Take 10 mg by mouth daily.    . Cholecalciferol 1000 units capsule Take 1 tablet by mouth daily.    . Cinnamon 500 MG TABS Take 2 capsules by mouth 2 (two) times daily.    . cyanocobalamin (,VITAMIN B-12,)  1000 MCG/ML injection Inject 1,000 mcg into the muscle.  11  . estrogen, conjugated,-medroxyprogesterone (PREMPRO) 0.45-1.5 MG tablet TAKE 1 TABLET BY MOUTH EVERY DAY 84 tablet 3  . fenofibrate (TRICOR) 145 MG tablet Take 1 tablet (145 mg total) by mouth daily. 90 tablet 1  . fluticasone (FLONASE) 50 MCG/ACT nasal spray Place 1 spray into both nostrils daily. 16 g 2  . furosemide (LASIX) 40 MG tablet Take 40 mg (1 tab) by mouth daily. May take 1/2 tab on most days and 1 full tab daily when needed. 30 tablet 6  . ibuprofen (ADVIL,MOTRIN) 200 MG tablet Take 800 mg by mouth as needed for moderate pain.    Marland Kitchen levothyroxine (SYNTHROID, LEVOTHROID) 125 MCG tablet TAKE 1 TABLET EVERY MORNING ON AN EMPTY STOMACH WITH ONLY WATER FOR 30 MINUTES 30 tablet 5  . Magnesium 250 MG TABS Take 250 mg by mouth daily.    . metFORMIN (GLUCOPHAGE XR) 500 MG 24 hr tablet Take 2 tablets 2 x / day for Diabetes 360 tablet 1  . montelukast (SINGULAIR) 10 MG tablet Take 1 tablet daily for Allergies 90 tablet 3  . potassium chloride (K-DUR) 10 MEQ tablet Take 1 tablet (10 mEq total) by mouth every other day. 30 tablet 0  . omeprazole (PRILOSEC) 40 MG capsule Take 1 capsule daily for acid indigestion 90 capsule 1  . pregabalin (LYRICA) 75 MG capsule Take 1 capsule 3 x / day for neuropathy pains 270 capsule 1  . Fluticasone-Umeclidin-Vilant (TRELEGY  ELLIPTA) 100-62.5-25 MCG/INH AEPB Inhale 100 mcg into the lungs daily. 60 each 6   No facility-administered medications prior to visit.         Objective:   Physical Exam  Vitals:   03/03/18 1617  BP: 96/64  Pulse: 89  SpO2: 96%  Weight: 173 lb (78.5 kg)  Height: 5' 1.5" (1.562 m)   Gen: Pleasant, overwt, in no distress,  normal affect  ENT: No lesions,  mouth clear,  oropharynx clear, no postnasal drip  Neck: No JVD, no stridor  Lungs: No use of accessory muscles, clear B   Cardiovascular: RRR, heart sounds normal, no murmur or gallops, no peripheral edema  Musculoskeletal: No deformities, no cyanosis or clubbing  Neuro: alert, non focal  Skin: Warm, no lesions or rash     Assessment & Plan:  Abnormal CT of the chest Inflammatory changes noted on her CT scan have all resolved.  I do not believe there is any other intervention here that needs to be made.  Dyspnea on exertion Based on her pulmonary function testing, CT scan I do not believe there is obstructive lung disease or any significant pulmonary contribution to her dyspnea.  This is reassuring and I believe that she is principally dealing now with deconditioning post hospitalization.  We talked about ways to begin to build her stamina back up.  Your CT chest and pulmonary function testing are both reassuring.  We should not need to start any scheduled inhaled medication.  You can keep albuterol available to use 2 puffs if needed for shortness of breath Continue your lasix as managed by Drs Gwenlyn Found and Melford Aase.  Follow with Dr Lamonte Sakai as needed for any changes in your breathing  Baltazar Apo, MD, PhD 03/03/2018, 4:55 PM [facility] [phone] or if no answer [phone]

## 2018-03-03 NOTE — Assessment & Plan Note (Signed)
Based on her pulmonary function testing, CT scan I do not believe there is obstructive lung disease or any significant pulmonary contribution to her dyspnea.  This is reassuring and I believe that she is principally dealing now with deconditioning post hospitalization.  We talked about ways to begin to build her stamina back up.  Your CT chest and pulmonary function testing are both reassuring.  We should not need to start any scheduled inhaled medication.  You can keep albuterol available to use 2 puffs if needed for shortness of breath Continue your lasix as managed by Drs Berry and Oneta RackMcKeAllyson Sabalown.  Follow with Dr Delton CoombesByrum as needed for any changes in your breathing

## 2018-03-13 NOTE — Progress Notes (Signed)
Assessment and Plan:  Caitlin Barnett was seen today for sinusitis and wheezing.  Diagnoses and all orders for this visit:  Acute mucoid otitis media of left ear Continue allergy medications, hygiene discussed  if drainage from ear, fever, chills, HA, nausea, call the office or go to the ER Follow up PRN -     amoxicillin-clavulanate (AUGMENTIN) 875-125 MG tablet; Take 1 tablet by mouth 2 (two) times daily for 10 days. -     predniSONE (DELTASONE) 20 MG tablet; 2 tablets daily for 3 days, 1 tablet daily for 4 days. -     promethazine-dextromethorphan (PROMETHAZINE-DM) 6.25-15 MG/5ML syrup; Take 5 mLs by mouth 4 (four) times daily as needed for cough.  Further disposition pending results of labs. Discussed med's effects and SE's.   Over 15 minutes of exam, counseling, chart review, and critical decision making was performed.   Future Appointments  Date Time Provider [place]  03/17/2018  8:00 AM [facility] [place] [facility]  05/11/2018  8:20 AM Lelon Perla, MD [facility]  05/13/2018  4:30 PM Liane Comber, NP [facility] None  08/20/2018  3:45 PM Unk Pinto, MD [facility] None    ------------------------------------------------------------------------------------------------------------------   HPI BP 98/60   Pulse 96   Temp (!) 97.5 F (36.4 C)   Ht 5' 1.5" (1.562 m)   Wt 172 lb (78 kg)   SpO2 96%   BMI 31.97 kg/m   52 y.o.female with hx of pneumonia with hospitalization earlier this year presents for evaluation of URI; she reports symptoms started with facial pressure/nasal congestion and discharge that began 4 days ago. Developed a mild fever 3 days ago, having lots of post nasal drip and starting to cough. Was feeling wheezy so started albuterol neb at home and doing well on this. She does report left ear pressure/popping/clicking. Endorses mild headache that resolved with ibuprofen.   She is on zyrtec, singulair, flonase for allergies.  Past Medical  History:  Diagnosis Date  . Diabetes mellitus without complication ([place])   . Diverticulitis   . GERD (gastroesophageal reflux disease)   . Hyperlipidemia   . IBS (irritable bowel syndrome)   . Migraine   . Pneumonia 11/2017  . Thyroid disease   . UTI (urinary tract infection)      Allergies  Allergen Reactions  . Tetanus Toxoids Other (See Comments)    Injection site abcess  . Sulfa Antibiotics Hives    itching    Current Outpatient Medications on File Prior to Visit  Medication Sig  . albuterol (PROVENTIL HFA;VENTOLIN HFA) 108 (90 Base) MCG/ACT inhaler Inhale 2 puffs into the lungs every 4 (four) hours as needed for wheezing or shortness of breath.  Marland Kitchen albuterol (PROVENTIL) (2.5 MG/3ML) 0.083% nebulizer solution Take 3 mLs (2.5 mg total) by nebulization every 6 (six) hours as needed for wheezing or shortness of breath.  Marland Kitchen aspirin EC 81 MG tablet Take 81 mg by mouth daily.  . blood glucose meter kit and supplies KIT Dispense based on patient and insurance preference. Use up to four times daily as directed. (FOR ICD-9 250.00, 250.01).  . butalbital-acetaminophen-caffeine (FIORICET, ESGIC) 50-325-40 MG tablet Take 1/2 to 1 tablet every 4 hours if needed for headache  . cetirizine (ZYRTEC) 10 MG tablet Take 10 mg by mouth daily.  . Cholecalciferol 1000 units capsule Take 5,000 Units by mouth 2 (two) times daily.   . Cinnamon 500 MG TABS Take 2 capsules by mouth 2 (two) times daily.  . cyanocobalamin (,VITAMIN B-12,)  1000 MCG/ML injection Inject 1,000 mcg into the muscle.  . estrogen, conjugated,-medroxyprogesterone (PREMPRO) 0.45-1.5 MG tablet TAKE 1 TABLET BY MOUTH EVERY DAY  . fenofibrate (TRICOR) 145 MG tablet Take 1 tablet (145 mg total) by mouth daily.  . fluticasone (FLONASE) 50 MCG/ACT nasal spray Place 1 spray into both nostrils daily.  . furosemide (LASIX) 40 MG tablet Take 40 mg (1 tab) by mouth daily. May take 1/2 tab on most days and 1 full tab daily when needed.  Marland Kitchen  ibuprofen (ADVIL,MOTRIN) 200 MG tablet Take 800 mg by mouth as needed for moderate pain.  Marland Kitchen levothyroxine (SYNTHROID, LEVOTHROID) 125 MCG tablet TAKE 1 TABLET EVERY MORNING ON AN EMPTY STOMACH WITH ONLY WATER FOR 30 MINUTES  . Magnesium 250 MG TABS Take 250 mg by mouth daily.  . metFORMIN (GLUCOPHAGE XR) 500 MG 24 hr tablet Take 2 tablets 2 x / day for Diabetes  . montelukast (SINGULAIR) 10 MG tablet Take 1 tablet daily for Allergies  . omeprazole (PRILOSEC) 40 MG capsule Take 1 capsule daily for acid indigestion  . potassium chloride (K-DUR) 10 MEQ tablet Take 1 tablet (10 mEq total) by mouth every other day.  . pregabalin (LYRICA) 75 MG capsule Take 1 capsule 3 x / day for neuropathy pains   No current facility-administered medications on file prior to visit.     ROS: all negative except above.   Physical Exam:  BP 98/60   Pulse 96   Temp (!) 97.5 F (36.4 C)   Ht 5' 1.5" (1.562 m)   Wt 172 lb (78 kg)   SpO2 96%   BMI 31.97 kg/m   General Appearance: Well nourished, in no apparent distress. Eyes: PERRLA, EOMs, conjunctiva no swelling or erythema Sinuses: Generlized Frontal/maxillary pressure without tenderness ENT/Mouth: Ext aud canals clear, R TMs without erythema, bulging, L TM with opaque/white/green effusion present, no obvious bulging/erythema. Mastoid, auricle, tragus non-tender. No erythema, swelling, or exudate on post pharynx.  Tonsils not swollen or erythematous. Hearing normal.  Neck: Supple  Respiratory: Respiratory effort normal, BS equal bilaterally without rales, rhonchi, wheezing or stridor.  Cardio: RRR with no MRGs. Brisk peripheral pulses without edema.  Lymphatics: Non tender without lymphadenopathy.  Musculoskeletal: normal gait.  Skin: Warm, dry without rashes, lesions, ecchymosis.  Neuro: Cranial nerves intact.  Psych: Awake and oriented X 3, normal affect, Insight and Judgment appropriate.     [name], NP 3:29 PM [facility]

## 2018-03-16 ENCOUNTER — Ambulatory Visit: Payer: Managed Care, Other (non HMO) | Admitting: Adult Health

## 2018-03-16 ENCOUNTER — Encounter: Payer: Self-pay | Admitting: Adult Health

## 2018-03-16 VITALS — BP 98/60 | HR 96 | Temp 97.5°F | Ht 61.5 in | Wt 172.0 lb

## 2018-03-16 DIAGNOSIS — H65112 Acute and subacute allergic otitis media (mucoid) (sanguinous) (serous), left ear: Secondary | ICD-10-CM | POA: Diagnosis not present

## 2018-03-16 MED ORDER — AMOXICILLIN-POT CLAVULANATE 875-125 MG PO TABS: 1.0000 | ORAL_TABLET | Freq: Two times a day (BID) | ORAL | 0 refills | Status: AC

## 2018-03-16 MED ORDER — PROMETHAZINE-DM 6.25-15 MG/5ML PO SYRP: 5.0000 mL | ORAL_SOLUTION | Freq: Four times a day (QID) | ORAL | 1 refills | Status: DC | PRN

## 2018-03-16 MED ORDER — PREDNISONE 20 MG PO TABS: ORAL_TABLET | ORAL | 0 refills | Status: DC

## 2018-03-16 NOTE — Patient Instructions (Signed)

## 2018-03-17 ENCOUNTER — Ambulatory Visit
Admission: RE | Admit: 2018-03-17 | Discharge: 2018-03-17 | Disposition: A | Discharge status: Home / Self Care | Payer: Managed Care, Other (non HMO) | Source: Ambulatory Visit | Attending: Internal Medicine | Admitting: Internal Medicine

## 2018-03-17 DIAGNOSIS — R7989 Other specified abnormal findings of blood chemistry: Secondary | ICD-10-CM

## 2018-03-17 DIAGNOSIS — R945 Abnormal results of liver function studies: Secondary | ICD-10-CM

## 2018-03-23 ENCOUNTER — Other Ambulatory Visit: Payer: Self-pay | Admitting: Internal Medicine

## 2018-03-23 DIAGNOSIS — G609 Hereditary and idiopathic neuropathy, unspecified: Secondary | ICD-10-CM

## 2018-03-23 DIAGNOSIS — R5383 Other fatigue: Secondary | ICD-10-CM

## 2018-03-25 ENCOUNTER — Other Ambulatory Visit: Payer: Self-pay | Admitting: Internal Medicine

## 2018-03-25 DIAGNOSIS — E1140 Type 2 diabetes mellitus with diabetic neuropathy, unspecified: Secondary | ICD-10-CM

## 2018-03-25 MED ORDER — GABAPENTIN 600 MG PO TABS: ORAL_TABLET | ORAL | 1 refills | Status: DC

## 2018-04-04 ENCOUNTER — Other Ambulatory Visit: Payer: Self-pay | Admitting: Adult Health

## 2018-04-04 DIAGNOSIS — R062 Wheezing: Secondary | ICD-10-CM

## 2018-04-23 IMAGING — CR DG CHEST 2V
2 series · 2 of 2 positions shown · non-contrast
Comparison: None.

CLINICAL DATA: Shortness of breath and cough. Fevers. Symptoms for
2 weeks.

EXAM:
CHEST - 2 VIEW

[chest pa]
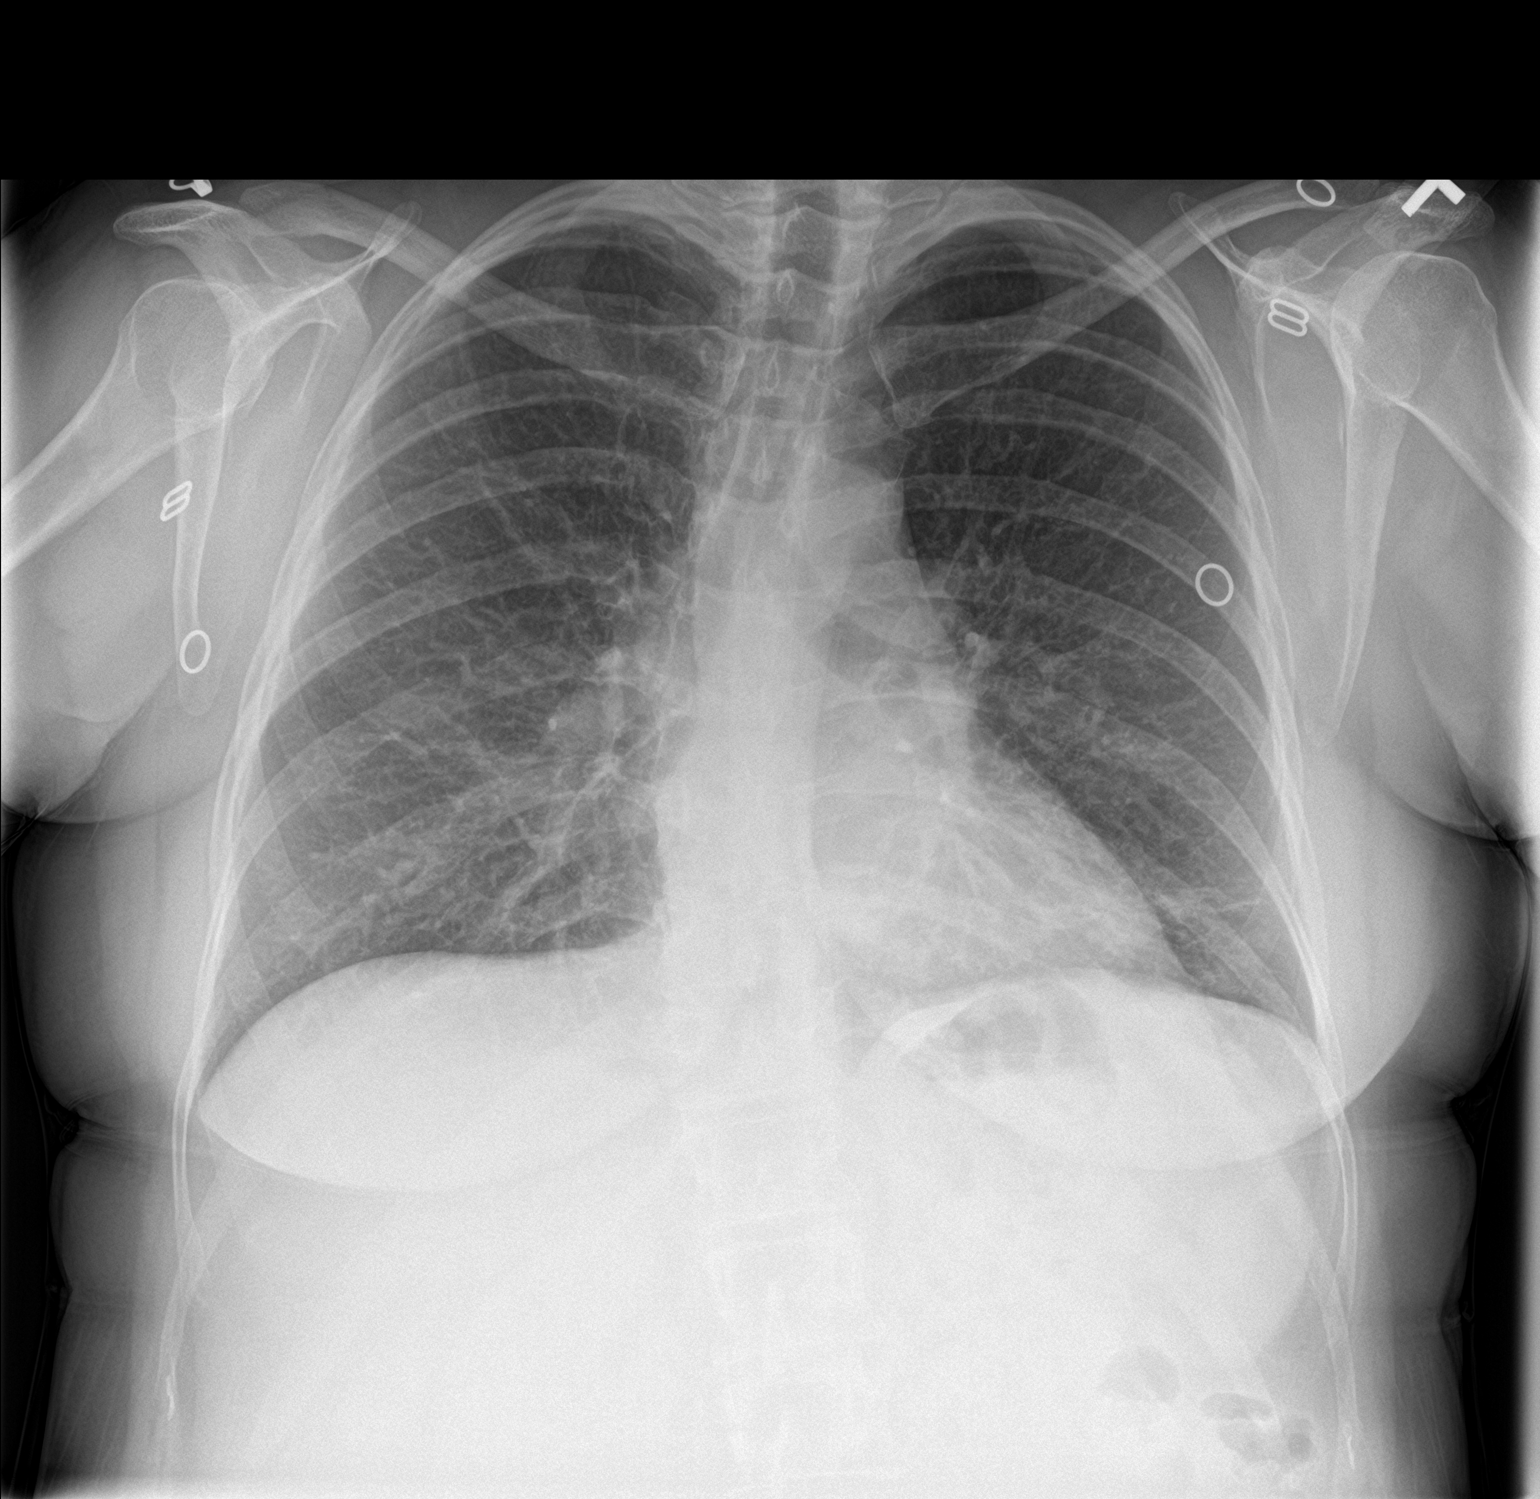

[chest lat]
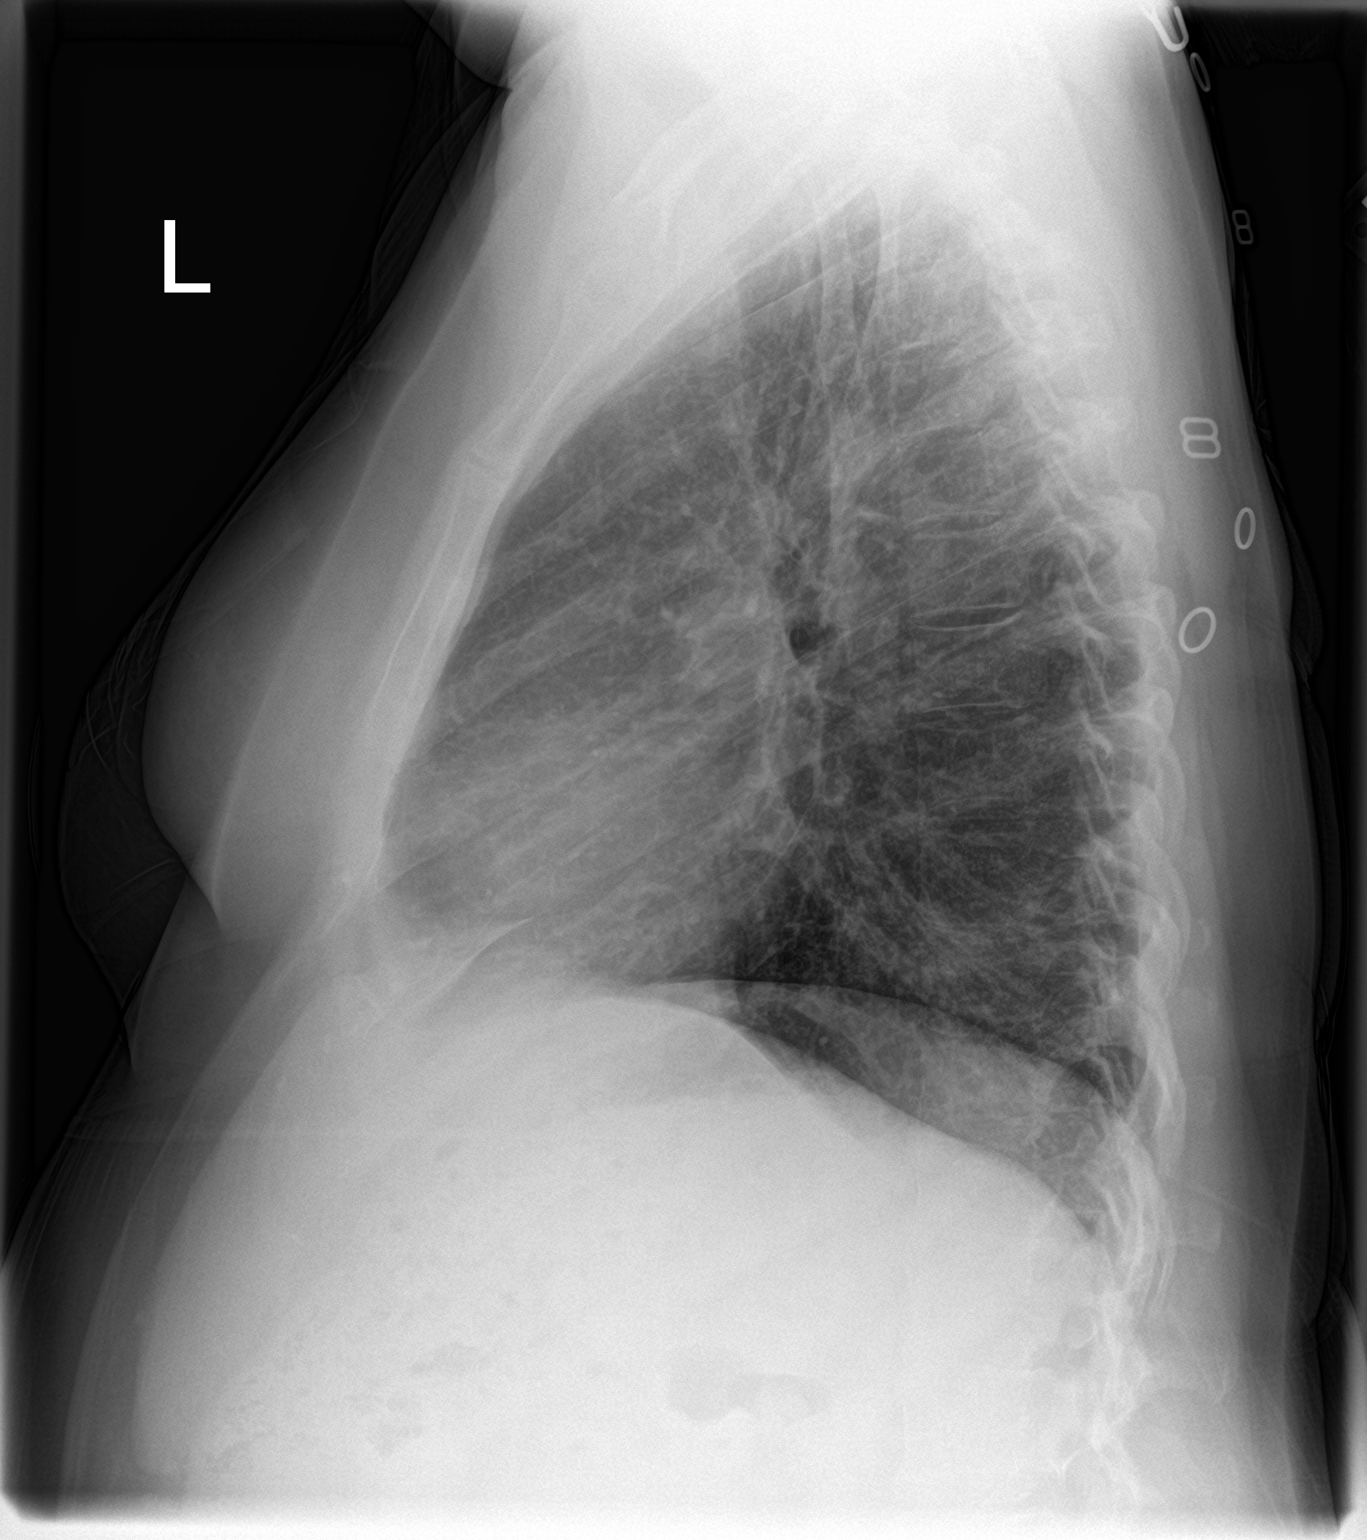

[2 of 2 positions shown; findings below may reference images not displayed]

FINDINGS: Streaky opacity at the left base, lower lobe based on the lateral.
No effusion or edema. Normal heart size. Thoracic dextroscoliosis.
IMPRESSION: Left lower lobe bronchopneumonia and/or atelectasis. Followup PA and
lateral chest X-ray is recommended in 3-4 weeks to ensure
resolution.

## 2018-04-24 ENCOUNTER — Ambulatory Visit: Payer: Managed Care, Other (non HMO) | Admitting: Podiatry

## 2018-04-24 ENCOUNTER — Other Ambulatory Visit: Payer: Self-pay

## 2018-04-24 DIAGNOSIS — E118 Type 2 diabetes mellitus with unspecified complications: Secondary | ICD-10-CM

## 2018-04-24 DIAGNOSIS — L600 Ingrowing nail: Secondary | ICD-10-CM

## 2018-04-24 DIAGNOSIS — M79676 Pain in unspecified toe(s): Secondary | ICD-10-CM | POA: Diagnosis not present

## 2018-04-24 MED ORDER — TRAMADOL HCL 50 MG PO TABS: 50.0000 mg | ORAL_TABLET | Freq: Three times a day (TID) | ORAL | 0 refills | Status: DC | PRN

## 2018-04-24 NOTE — Patient Instructions (Signed)

## 2018-04-24 NOTE — Progress Notes (Signed)
Subjective:  Patient ID: Caitlin Barnett, female    DOB: 1966-06-09,  MRN: [id number]  Chief Complaint  Patient presents with  . Ingrown Toenail    left great toenail, lateral border - painful x 11 days -tried to dig it out herself    52 y.o. female presents with the above complaint.  Reports pain to the outside of the left great toenail.  Has been painful the past 11 days trying to get herself.  States that she is prone to getting ingrown nails to both great toes.  Has never had professional attempted removal before.  Patient works as an Therapist, sports.  Review of Systems: Negative except as noted in the HPI. Denies N/V/F/Ch.  Past Medical History:  Diagnosis Date  . Diabetes mellitus without complication ([place])   . Diverticulitis   . GERD (gastroesophageal reflux disease)   . Hyperlipidemia   . IBS (irritable bowel syndrome)   . Migraine   . Pneumonia 11/2017  . Thyroid disease   . UTI (urinary tract infection)     Current Outpatient Medications:  .  albuterol (PROVENTIL HFA;VENTOLIN HFA) 108 (90 Base) MCG/ACT inhaler, Use 1 to 2 inhalations 15 minutes apart every 4 hours to rescue Asthma Attack, Disp: 48 g, Rfl: 3 .  albuterol (PROVENTIL) (2.5 MG/3ML) 0.083% nebulizer solution, Take 3 mLs (2.5 mg total) by nebulization every 6 (six) hours as needed for wheezing or shortness of breath., Disp: 75 mL, Rfl: 12 .  aspirin EC 81 MG tablet, Take 81 mg by mouth daily., Disp: , Rfl:  .  blood glucose meter kit and supplies KIT, Dispense based on patient and insurance preference. Use up to four times daily as directed. (FOR ICD-9 250.00, 250.01)., Disp: 1 each, Rfl: 0 .  buPROPion (WELLBUTRIN XL) 150 MG 24 hr tablet, TAKE 1 TABLET BY MOUTH EVERY DAY IN THE MORNING, Disp: , Rfl: 1 .  butalbital-acetaminophen-caffeine (FIORICET, ESGIC) 50-325-40 MG tablet, Take 1/2 to 1 tablet every 4 hours if needed for headache, Disp: 20 tablet, Rfl: 0 .  cetirizine (ZYRTEC) 10 MG tablet, Take 10 mg by mouth daily., Disp:  , Rfl:  .  Cholecalciferol 1000 units capsule, Take 5,000 Units by mouth 2 (two) times daily. , Disp: , Rfl:  .  Cinnamon 500 MG TABS, Take 2 capsules by mouth 2 (two) times daily., Disp: , Rfl:  .  cyanocobalamin (,VITAMIN B-12,) 1000 MCG/ML injection, Inject 1,000 mcg into the muscle., Disp: , Rfl: 11 .  estrogen, conjugated,-medroxyprogesterone (PREMPRO) 0.45-1.5 MG tablet, TAKE 1 TABLET BY MOUTH EVERY DAY, Disp: 84 tablet, Rfl: 3 .  fenofibrate (TRICOR) 145 MG tablet, Take 1 tablet (145 mg total) by mouth daily., Disp: 90 tablet, Rfl: 1 .  fluticasone (FLONASE) 50 MCG/ACT nasal spray, Place 1 spray into both nostrils daily., Disp: 16 g, Rfl: 2 .  furosemide (LASIX) 40 MG tablet, Take 40 mg (1 tab) by mouth daily. May take 1/2 tab on most days and 1 full tab daily when needed., Disp: 30 tablet, Rfl: 6 .  gabapentin (NEURONTIN) 600 MG tablet, Take 1/2 to 1 tablet 3 to 4 x /day for Diabetic Neuropathy, Disp: 360 tablet, Rfl: 1 .  ibuprofen (ADVIL,MOTRIN) 200 MG tablet, Take 800 mg by mouth as needed for moderate pain., Disp: , Rfl:  .  levothyroxine (SYNTHROID, LEVOTHROID) 125 MCG tablet, TAKE 1 TABLET EVERY MORNING ON AN EMPTY STOMACH WITH ONLY WATER FOR 30 MINUTES, Disp: 30 tablet, Rfl: 5 .  Magnesium 250 MG TABS, Take 250  mg by mouth daily., Disp: , Rfl:  .  metFORMIN (GLUCOPHAGE XR) 500 MG 24 hr tablet, Take 2 tablets 2 x / day for Diabetes, Disp: 360 tablet, Rfl: 1 .  montelukast (SINGULAIR) 10 MG tablet, Take 1 tablet daily for Allergies, Disp: 90 tablet, Rfl: 3 .  omeprazole (PRILOSEC) 40 MG capsule, Take 1 capsule daily for acid indigestion, Disp: 90 capsule, Rfl: 1 .  omeprazole (PRILOSEC) 40 MG capsule, TAKE 1 CAPSULE DAILY FOR ACID INDIGESTION, Disp: , Rfl: 1 .  potassium chloride (K-DUR) 10 MEQ tablet, Take 1 tablet (10 mEq total) by mouth every other day., Disp: 30 tablet, Rfl: 0 .  predniSONE (DELTASONE) 20 MG tablet, 2 tablets daily for 3 days, 1 tablet daily for 4 days., Disp: 10  tablet, Rfl: 0 .  pregabalin (LYRICA) 75 MG capsule, TAKE 1 CAPSULE 3 TIMES DAY FOR NEUROPATHY PAINS, Disp: 270 capsule, Rfl: 1 .  promethazine-dextromethorphan (PROMETHAZINE-DM) 6.25-15 MG/5ML syrup, Take 5 mLs by mouth 4 (four) times daily as needed for cough., Disp: 240 mL, Rfl: 1 .  traMADol (ULTRAM) 50 MG tablet, Take 1 tablet (50 mg total) by mouth every 8 (eight) hours as needed., Disp: 4 tablet, Rfl: 0  Social History   Tobacco Use  Smoking Status Former Smoker  . Packs/day: 0.50  . Years: 30.00  . Pack years: 15.00  . Types: Cigarettes  . Last attempt to quit: 08/24/2017  . Years since quitting: 0.6  Smokeless Tobacco Never Used    Allergies  Allergen Reactions  . Tetanus Toxoids Other (See Comments)    Injection site abcess  . Sulfa Antibiotics Hives    itching   Objective:  There were no vitals filed for this visit. There is no height or weight on file to calculate BMI. Constitutional Well developed. Well nourished.  Vascular Dorsalis pedis pulses palpable bilaterally. Posterior tibial pulses palpable bilaterally. Capillary refill normal to all digits.  No cyanosis or clubbing noted. Pedal hair growth normal.  Neurologic Normal speech. Oriented to person, place, and time. Epicritic sensation to light touch grossly present bilaterally.  Dermatologic Painful ingrowing nail at lateral nail borders of the hallux nail left. No other open wounds. No skin lesions.  Orthopedic: Normal joint ROM without pain or crepitus bilaterally. No visible deformities. No bony tenderness.   Radiographs: None Assessment:   1. Ingrown nail   2. Pain around toenail   3. Type 2 diabetes mellitus with complication, without long-term current use of insulin ([place])    Plan:  Patient was evaluated and treated and all questions answered.  Ingrown Nail, left -Patient elects to proceed with minor surgery to remove ingrown toenail removal today. Consent reviewed and signed by  patient. -Ingrown nail excised. See procedure note. -Educated on post-procedure care including soaking. Written instructions provided and reviewed. -Diabetes under good control though it is still a risk factor for delayed healing -Patient to follow up in 2 weeks for nail check.  Procedure: Excision of Ingrown Toenail Location: Left 1st toe lateral nail borders. Anesthesia: Lidocaine 1% plain; 1.5 mL and Marcaine 0.5% plain; 1.5 mL, digital block. Skin Prep: Betadine. Dressing: Silvadene; telfa; dry, sterile, compression dressing. Technique: Following skin prep, the toe was exsanguinated and a tourniquet was secured at the base of the toe. The affected nail border was freed, split with a nail splitter, and excised. Chemical matrixectomy was then performed with phenol and irrigated out with alcohol. The tourniquet was then removed and sterile dressing applied. Disposition: Patient tolerated procedure  well. Patient to return in 2 weeks for follow-up.   Return in about 2 weeks (around 05/08/2018), or 2 week nail check left, for Nail Check.

## 2018-04-28 ENCOUNTER — Other Ambulatory Visit: Payer: Self-pay | Admitting: Adult Health

## 2018-04-28 DIAGNOSIS — F17210 Nicotine dependence, cigarettes, uncomplicated: Secondary | ICD-10-CM

## 2018-05-01 ENCOUNTER — Other Ambulatory Visit: Payer: Self-pay | Admitting: Adult Health

## 2018-05-04 ENCOUNTER — Other Ambulatory Visit: Payer: Self-pay | Admitting: Adult Health

## 2018-05-04 NOTE — Progress Notes (Signed)
HPI: Follow-up diastolic congestive heart failure and hyperlipidemia.  Previously followed by Dr. Gwenlyn Found.  Seen previously for palpitations, chest pain, dyspnea and edema.  Echocardiogram April 2019 showed normal LV systolic function, mild diastolic dysfunction.  Monitor April 2019 showed sinus to sinus tachycardia.  Nuclear study April 2019 showed ejection fraction 73% and no ischemia.  ABIs April 2019 normal.  CTA April 2019 showed no pulmonary embolus.  There is prominent peribronchial thickening.  Treated for diastolic congestive heart failure with Lasix 20 mg daily.  Since last seen she has mild dyspnea on exertion but no orthopnea, PND, pedal edema, chest pain, palpitations or syncope.  Current Outpatient Medications  Medication Sig Dispense Refill  . albuterol (PROVENTIL HFA;VENTOLIN HFA) 108 (90 Base) MCG/ACT inhaler Use 1 to 2 inhalations 15 minutes apart every 4 hours to rescue Asthma Attack 48 g 3  . albuterol (PROVENTIL) (2.5 MG/3ML) 0.083% nebulizer solution Take 3 mLs (2.5 mg total) by nebulization every 6 (six) hours as needed for wheezing or shortness of breath. 75 mL 12  . aspirin EC 81 MG tablet Take 81 mg by mouth daily.    . blood glucose meter kit and supplies KIT Dispense based on patient and insurance preference. Use up to four times daily as directed. (FOR ICD-9 250.00, 250.01). 1 each 0  . butalbital-acetaminophen-caffeine (FIORICET, ESGIC) 50-325-40 MG tablet Take 1/2 to 1 tablet every 4 hours if needed for headache 20 tablet 0  . cephALEXin (KEFLEX) 500 MG capsule Take 1 capsule (500 mg total) by mouth 2 (two) times daily. 14 capsule 0  . cetirizine (ZYRTEC) 10 MG tablet Take 10 mg by mouth daily.    . Cholecalciferol 1000 units capsule Take 5,000 Units by mouth 2 (two) times daily.     . Cinnamon 500 MG TABS Take 2 capsules by mouth 2 (two) times daily.    . cyanocobalamin (,VITAMIN B-12,) 1000 MCG/ML injection Inject 1,000 mcg into the muscle.  11  . estrogen,  conjugated,-medroxyprogesterone (PREMPRO) 0.45-1.5 MG tablet TAKE 1 TABLET BY MOUTH EVERY DAY 84 tablet 3  . fenofibrate (TRICOR) 145 MG tablet TAKE 1 TABLET BY MOUTH EVERY DAY 90 tablet 1  . fluticasone (FLONASE) 50 MCG/ACT nasal spray Place 1 spray into both nostrils daily. 16 g 2  . furosemide (LASIX) 40 MG tablet Take 40 mg (1 tab) by mouth daily. May take 1/2 tab on most days and 1 full tab daily when needed. 30 tablet 6  . gabapentin (NEURONTIN) 600 MG tablet Take 1/2 to 1 tablet 3 to 4 x /day for Diabetic Neuropathy 360 tablet 1  . ibuprofen (ADVIL,MOTRIN) 200 MG tablet Take 800 mg by mouth as needed for moderate pain.    Marland Kitchen levothyroxine (SYNTHROID, LEVOTHROID) 125 MCG tablet TAKE 1 TABLET EVERY MORNING ON AN EMPTY STOMACH WITH ONLY WATER FOR 30 MINUTES 30 tablet 5  . Magnesium 250 MG TABS Take 250 mg by mouth daily.    . metFORMIN (GLUCOPHAGE XR) 500 MG 24 hr tablet Take 2 tablets 2 x / day for Diabetes 360 tablet 1  . montelukast (SINGULAIR) 10 MG tablet Take 1 tablet daily for Allergies 90 tablet 3  . omeprazole (PRILOSEC) 40 MG capsule TAKE 1 CAPSULE DAILY FOR ACID INDIGESTION  1  . potassium chloride (K-DUR) 10 MEQ tablet TAKE 1 TABLET (10 MEQ TOTAL) BY MOUTH EVERY OTHER DAY. 15 tablet 1  . predniSONE (DELTASONE) 20 MG tablet 2 tablets daily for 3 days, 1 tablet daily  for 4 days. 10 tablet 0  . pregabalin (LYRICA) 75 MG capsule TAKE 1 CAPSULE 3 TIMES DAY FOR NEUROPATHY PAINS 270 capsule 1  . promethazine-dextromethorphan (PROMETHAZINE-DM) 6.25-15 MG/5ML syrup Take 5 mLs by mouth 4 (four) times daily as needed for cough. 240 mL 1  . traMADol (ULTRAM) 50 MG tablet Take 1 tablet (50 mg total) by mouth every 8 (eight) hours as needed. 4 tablet 0   No current facility-administered medications for this visit.      Past Medical History:  Diagnosis Date  . Diabetes mellitus without complication ([place])   . Diverticulitis   . GERD (gastroesophageal reflux disease)   . Hyperlipidemia   .  IBS (irritable bowel syndrome)   . Migraine   . Pneumonia 11/2017  . Thyroid disease   . UTI (urinary tract infection)     Past Surgical History:  Procedure Laterality Date  . CESAREAN SECTION  1993  . cystoscopies     multiple  . FUNCTIONAL ENDOSCOPIC SINUS SURGERY  2003  . LAPAROSCOPIC OVARIAN CYSTECTOMY  1983  . TONSILLECTOMY  1973  . TYMPANOPLASTY W/ MASTOIDECTOMY Left 2016 & 2017    Social History   Socioeconomic History  . Marital status: Married    Spouse name: Not on file  . Number of children: Not on file  . Years of education: Not on file  . Highest education level: Not on file  Occupational History  . Not on file  Social Needs  . Financial resource strain: Not on file  . Food insecurity:    Worry: Not on file    Inability: Not on file  . Transportation needs:    Medical: Not on file    Non-medical: Not on file  Tobacco Use  . Smoking status: Former Smoker    Packs/day: 0.50    Years: 30.00    Pack years: 15.00    Types: Cigarettes    Last attempt to quit: 08/24/2017    Years since quitting: 0.7  . Smokeless tobacco: Never Used  Substance and Sexual Activity  . Alcohol use: No  . Drug use: No  . Sexual activity: Not on file  Lifestyle  . Physical activity:    Days per week: Not on file    Minutes per session: Not on file  . Stress: Not on file  Relationships  . Social connections:    Talks on phone: Not on file    Gets together: Not on file    Attends religious service: Not on file    Active member of club or organization: Not on file    Attends meetings of clubs or organizations: Not on file    Relationship status: Not on file  . Intimate partner violence:    Fear of current or ex partner: Not on file    Emotionally abused: Not on file    Physically abused: Not on file    Forced sexual activity: Not on file  Other Topics Concern  . Not on file  Social History Narrative  . Not on file    Family History  Problem Relation Age of Onset    . Mental illness Mother        depression  . Suicidality Mother   . Alcohol abuse Father   . Kidney disease Father   . Mental illness Father        depression  . Suicidality Father   . Breast cancer Maternal Aunt        breast cancer  .  Heart disease Maternal Grandmother   . Breast cancer Maternal Grandmother   . Suicidality Maternal Grandfather   . Stroke Paternal Grandmother   . Heart disease Paternal Grandmother   . Heart disease Paternal Grandfather     ROS: no fevers or chills, productive cough, hemoptysis, dysphasia, odynophagia, melena, hematochezia, dysuria, hematuria, rash, seizure activity, orthopnea, PND, pedal edema, claudication. Remaining systems are negative.  Physical Exam: Well-developed well-nourished in no acute distress.  Skin is warm and dry.  HEENT is normal.  Neck is supple.  Chest is clear to auscultation with normal expansion.  Cardiovascular exam is regular rate and rhythm.  Abdominal exam nontender or distended. No masses palpated. Extremities show no edema. neuro grossly intact   A/P  1 chronic diastolic congestive heart failure-patient appears to be euvolemic on examination.  We will continue present dose of Lasix. We discussed the importance of low-sodium diet and fluid restriction.  2 hyperlipidemia-we will continue with present therapy.  She is scheduled to have lipids drawn on Wednesday of this week.  We will review those and adjust regimen as needed.  3 diabetes mellitus-management per primary care.  Kirk Ruths, MD

## 2018-05-08 ENCOUNTER — Ambulatory Visit: Payer: Managed Care, Other (non HMO) | Admitting: Podiatry

## 2018-05-08 VITALS — Temp 98.0°F

## 2018-05-08 DIAGNOSIS — M79674 Pain in right toe(s): Secondary | ICD-10-CM

## 2018-05-08 DIAGNOSIS — L02612 Cutaneous abscess of left foot: Secondary | ICD-10-CM

## 2018-05-08 DIAGNOSIS — L03032 Cellulitis of left toe: Secondary | ICD-10-CM

## 2018-05-08 DIAGNOSIS — L600 Ingrowing nail: Secondary | ICD-10-CM

## 2018-05-08 MED ORDER — CEPHALEXIN 500 MG PO CAPS: 500.0000 mg | ORAL_CAPSULE | Freq: Two times a day (BID) | ORAL | 0 refills | Status: DC

## 2018-05-08 NOTE — Patient Instructions (Signed)

## 2018-05-08 NOTE — Progress Notes (Signed)
Subjective:  Patient ID: Caitlin Barnett, female    DOB: 1966/06/20,  MRN: [id number]  Chief Complaint  Patient presents with  . Ingrown Toenail    Wound not healing well, some drainag/redness/swelling/inflammation/pus, blistered once. 2wk f/u    52 y.o. female presents with the above complaint.  Patient believes the wound is not healing very well with some drainage and redness.  States that the toenail had a little bit of pus draining on it since last visit.  Review of Systems: Negative except as noted in the HPI. Denies N/V/F/Ch.  Past Medical History:  Diagnosis Date  . Diabetes mellitus without complication ([place])   . Diverticulitis   . GERD (gastroesophageal reflux disease)   . Hyperlipidemia   . IBS (irritable bowel syndrome)   . Migraine   . Pneumonia 11/2017  . Thyroid disease   . UTI (urinary tract infection)     Current Outpatient Medications:  .  albuterol (PROVENTIL HFA;VENTOLIN HFA) 108 (90 Base) MCG/ACT inhaler, Use 1 to 2 inhalations 15 minutes apart every 4 hours to rescue Asthma Attack, Disp: 48 g, Rfl: 3 .  albuterol (PROVENTIL) (2.5 MG/3ML) 0.083% nebulizer solution, Take 3 mLs (2.5 mg total) by nebulization every 6 (six) hours as needed for wheezing or shortness of breath., Disp: 75 mL, Rfl: 12 .  aspirin EC 81 MG tablet, Take 81 mg by mouth daily., Disp: , Rfl:  .  blood glucose meter kit and supplies KIT, Dispense based on patient and insurance preference. Use up to four times daily as directed. (FOR ICD-9 250.00, 250.01)., Disp: 1 each, Rfl: 0 .  buPROPion (WELLBUTRIN XL) 150 MG 24 hr tablet, TAKE 1 TABLET BY MOUTH EVERY DAY IN THE MORNING, Disp: , Rfl: 1 .  buPROPion (WELLBUTRIN XL) 150 MG 24 hr tablet, TAKE 1 TABLET BY MOUTH EVERY DAY IN THE MORNING, Disp: 90 tablet, Rfl: 1 .  butalbital-acetaminophen-caffeine (FIORICET, ESGIC) 50-325-40 MG tablet, Take 1/2 to 1 tablet every 4 hours if needed for headache, Disp: 20 tablet, Rfl: 0 .  cephALEXin (KEFLEX) 500 MG  capsule, Take 1 capsule (500 mg total) by mouth 2 (two) times daily., Disp: 14 capsule, Rfl: 0 .  cetirizine (ZYRTEC) 10 MG tablet, Take 10 mg by mouth daily., Disp: , Rfl:  .  Cholecalciferol 1000 units capsule, Take 5,000 Units by mouth 2 (two) times daily. , Disp: , Rfl:  .  Cinnamon 500 MG TABS, Take 2 capsules by mouth 2 (two) times daily., Disp: , Rfl:  .  cyanocobalamin (,VITAMIN B-12,) 1000 MCG/ML injection, Inject 1,000 mcg into the muscle., Disp: , Rfl: 11 .  estrogen, conjugated,-medroxyprogesterone (PREMPRO) 0.45-1.5 MG tablet, TAKE 1 TABLET BY MOUTH EVERY DAY, Disp: 84 tablet, Rfl: 3 .  fenofibrate (TRICOR) 145 MG tablet, TAKE 1 TABLET BY MOUTH EVERY DAY, Disp: 90 tablet, Rfl: 1 .  fluticasone (FLONASE) 50 MCG/ACT nasal spray, Place 1 spray into both nostrils daily., Disp: 16 g, Rfl: 2 .  furosemide (LASIX) 40 MG tablet, Take 40 mg (1 tab) by mouth daily. May take 1/2 tab on most days and 1 full tab daily when needed., Disp: 30 tablet, Rfl: 6 .  gabapentin (NEURONTIN) 600 MG tablet, Take 1/2 to 1 tablet 3 to 4 x /day for Diabetic Neuropathy, Disp: 360 tablet, Rfl: 1 .  ibuprofen (ADVIL,MOTRIN) 200 MG tablet, Take 800 mg by mouth as needed for moderate pain., Disp: , Rfl:  .  levothyroxine (SYNTHROID, LEVOTHROID) 125 MCG tablet, TAKE 1 TABLET EVERY MORNING ON  AN EMPTY STOMACH WITH ONLY WATER FOR 30 MINUTES, Disp: 30 tablet, Rfl: 5 .  Magnesium 250 MG TABS, Take 250 mg by mouth daily., Disp: , Rfl:  .  metFORMIN (GLUCOPHAGE XR) 500 MG 24 hr tablet, Take 2 tablets 2 x / day for Diabetes, Disp: 360 tablet, Rfl: 1 .  montelukast (SINGULAIR) 10 MG tablet, Take 1 tablet daily for Allergies, Disp: 90 tablet, Rfl: 3 .  omeprazole (PRILOSEC) 40 MG capsule, Take 1 capsule daily for acid indigestion, Disp: 90 capsule, Rfl: 1 .  omeprazole (PRILOSEC) 40 MG capsule, TAKE 1 CAPSULE DAILY FOR ACID INDIGESTION, Disp: , Rfl: 1 .  potassium chloride (K-DUR) 10 MEQ tablet, TAKE 1 TABLET (10 MEQ TOTAL) BY  MOUTH EVERY OTHER DAY., Disp: 15 tablet, Rfl: 1 .  predniSONE (DELTASONE) 20 MG tablet, 2 tablets daily for 3 days, 1 tablet daily for 4 days., Disp: 10 tablet, Rfl: 0 .  pregabalin (LYRICA) 75 MG capsule, TAKE 1 CAPSULE 3 TIMES DAY FOR NEUROPATHY PAINS, Disp: 270 capsule, Rfl: 1 .  promethazine-dextromethorphan (PROMETHAZINE-DM) 6.25-15 MG/5ML syrup, Take 5 mLs by mouth 4 (four) times daily as needed for cough., Disp: 240 mL, Rfl: 1 .  traMADol (ULTRAM) 50 MG tablet, Take 1 tablet (50 mg total) by mouth every 8 (eight) hours as needed., Disp: 4 tablet, Rfl: 0  Social History   Tobacco Use  Smoking Status Former Smoker  . Packs/day: 0.50  . Years: 30.00  . Pack years: 15.00  . Types: Cigarettes  . Last attempt to quit: 08/24/2017  . Years since quitting: 0.7  Smokeless Tobacco Never Used    Allergies  Allergen Reactions  . Tetanus Toxoids Other (See Comments)    Injection site abcess  . Sulfa Antibiotics Hives    itching   Objective:   Vitals:   05/08/18 1453  Temp: 98 F (36.7 C)   There is no height or weight on file to calculate BMI. Constitutional Well developed. Well nourished.  Vascular Dorsalis pedis pulses palpable bilaterally. Posterior tibial pulses palpable bilaterally. Capillary refill normal to all digits.  No cyanosis or clubbing noted. Pedal hair growth normal.  Neurologic Normal speech. Oriented to person, place, and time. Epicritic sensation to light touch grossly present bilaterally.  Dermatologic Painful ingrowing nail at lateral nail borders of the hallux nail right. Left great toenail avulsion site healing well with only slight local redness but without warmth.  No drainage. No other open wounds. No skin lesions.  Orthopedic: Normal joint ROM without pain or crepitus bilaterally. No visible deformities. No bony tenderness.   Radiographs: None Assessment:   1. Ingrown nail   2. Cellulitis and abscess of toe of left foot   3. Pain in toe of  right foot    Plan:  Patient was evaluated and treated and all questions answered.  Ingrown Nail, left -Healing well with only slightly continued redness.  Not warm. -Patient provided pictures of her toe with a little bit of pus.  Rx Keflex as prophylaxis -Continue soaking -Follow-up in 1 week  Ingrown nail right -Patient would like to this discussed removal of the toenail as it is starting to become symptomatic.  Will consider next visit to make sure the left great toenail is healed first.   Return in about 1 week (around 05/15/2018) for Ingrown bilat .

## 2018-05-09 NOTE — Progress Notes (Signed)
FOLLOW UP  Assessment and Plan:   Hypertension At goal; currently on lasix 20 mg daily for edema; discussed may need to decrease with initiation of jardiance Monitor blood pressure at home; patient to call if consistently greater than 130/80 Continue DASH diet.   Reminder to go to the ER if any CP, SOB, nausea, dizziness, severe HA, changes vision/speech, left arm numbness and tingling and jaw pain.  Cholesterol Currently above goal; on fenofibrate, not on statin, will initiate pravastatin 20 mg daily, advised she start on omega 3/fish oil supplement for trigs Continue low cholesterol diet and exercise.  Check lipid panel.   Diabetes with other circulatory complications Continue medication: metformin, starting jardiance today - samples given, start 10 mg daily then increase to 25 mg daily; discussed risks/benefits, information given on AVS - call with any questions/concerns/potential SE Continue diet and exercise.  Perform daily foot/skin check, notify office of any concerning changes.  Check A1C  Obesity with co morbidities Long discussion about weight loss, diet, and exercise Recommended diet heavy in fruits and veggies and low in animal meats, cheeses, and dairy products, appropriate calorie intake Discussed ideal weight for height and initial weight goal (<155lb) She has taken phentermine with significant benefit previously and strongly requesting to restart this- disucssed cardiac risks and would like to proceed cautiously. Will start the patient on phentermine- 1/2 tab daily to begin with. Hand out given and AE's discussed, will do close follow up.  Will follow up in 3 months   Hypothyroidism continue medications the same pending lab results reminded to take on an empty stomach 30-23mns before food.  check TSH level  Vitamin D Def Below goal at last visit; she has changed her dose continue supplementation to maintain goal of 70-100 Check Vit D level  Continue diet and  meds as discussed. Further disposition pending results of labs. Discussed med's effects and SE's.   Over 30 minutes of exam, counseling, chart review, and critical decision making was performed.   Future Appointments  Date Time Provider [place] 08/20/2018  3:45 PM MUnk Pinto MD [facility] None    ----------------------------------------------------------------------------------------------------------------------  HPI 52y.o. female  presents for 3 month follow up on hypertension, cholesterol, diabetes, hypothyroid, obesity and vitamin D deficiency.   She was abdmitted this year in 11/2017 with LLL pneumonia; 2D ECHO showed normal LV function with grade 1 diastolic dysfunction. Improved with IV diuretics and remains on oral lasix 20 mg with improved breathing reported by patient.    BMI is Body mass index is 32.72 kg/m., she has been working on diet and exercise, working on cutting carbs, slowly starting to increase exercise, trying to walk daily.  Wt Readings from Last 3 Encounters:  05/13/18 176 lb (79.8 kg)  05/11/18 176 lb (79.8 kg)  03/16/18 172 lb (78 kg)   Her blood pressure has been controlled at home, today their BP is BP: 100/68  She does workout. She denies chest pain, shortness of breath, dizziness.   She is on cholesterol medication Fenofibrate and denies myalgias. Her cholesterol is not at goal. The cholesterol last visit was:   Lab Results  Component Value Date   CHOL 156 01/28/2018   HDL 35 (L) 01/28/2018   LCherry 01/28/2018     Comment:     . LDL cholesterol not calculated. Triglyceride levels greater than 400 mg/dL invalidate calculated LDL results. . Reference range: <100 . Desirable range <100 mg/dL for primary prevention;   <70 mg/dL for patients  with CHD or diabetic patients  with > or = 2 CHD risk factors. Marland Kitchen LDL-C is now calculated using the Martin-Hopkins  calculation, which is a validated novel method providing  better accuracy  than the Friedewald equation in the  estimation of LDL-C.  [name] et al. Annamaria Helling. 7741;287(86): 2061-2068  (http://education.[employer].com/faq/FAQ164)    TRIG 437 (H) 01/28/2018   CHOLHDL 4.5 01/28/2018    She has been working on diet and exercise for T2 diabetes treated by metformin 1000 mg BID, and denies hyperglycemia, hypoglycemia , increased appetite, nausea, paresthesia of the feet, polydipsia, polyuria and visual disturbances. She does check fasting glucose, ranges 100-150. Last A1C in the office was:  Lab Results  Component Value Date   HGBA1C 7.5 (H) 01/28/2018   She is on thyroid medication. Her medication was not changed last visit.   Lab Results  Component Value Date   TSH 0.56 01/28/2018   Patient is on Vitamin D supplement.   Lab Results  Component Value Date   VD25OH 46 01/28/2018        Current Medications:  Current Outpatient Medications on File Prior to Visit  Medication Sig  . albuterol (PROVENTIL HFA;VENTOLIN HFA) 108 (90 Base) MCG/ACT inhaler Use 1 to 2 inhalations 15 minutes apart every 4 hours to rescue Asthma Attack  . albuterol (PROVENTIL) (2.5 MG/3ML) 0.083% nebulizer solution Take 3 mLs (2.5 mg total) by nebulization every 6 (six) hours as needed for wheezing or shortness of breath.  Marland Kitchen aspirin EC 81 MG tablet Take 81 mg by mouth daily.  . blood glucose meter kit and supplies KIT Dispense based on patient and insurance preference. Use up to four times daily as directed. (FOR ICD-9 250.00, 250.01).  . butalbital-acetaminophen-caffeine (FIORICET, ESGIC) 50-325-40 MG tablet Take 1/2 to 1 tablet every 4 hours if needed for headache  . cephALEXin (KEFLEX) 500 MG capsule Take 1 capsule (500 mg total) by mouth 2 (two) times daily.  . cetirizine (ZYRTEC) 10 MG tablet Take 10 mg by mouth daily.  . Cholecalciferol 1000 units capsule Take 5,000 Units by mouth 2 (two) times daily.   . Cinnamon 500 MG TABS Take 2 capsules by mouth 2 (two) times daily.  .  cyanocobalamin (,VITAMIN B-12,) 1000 MCG/ML injection Inject 1,000 mcg into the muscle.  . estrogen, conjugated,-medroxyprogesterone (PREMPRO) 0.45-1.5 MG tablet TAKE 1 TABLET BY MOUTH EVERY DAY  . fenofibrate (TRICOR) 145 MG tablet TAKE 1 TABLET BY MOUTH EVERY DAY  . fluticasone (FLONASE) 50 MCG/ACT nasal spray Place 1 spray into both nostrils daily.  . furosemide (LASIX) 40 MG tablet Take 40 mg (1 tab) by mouth daily. May take 1/2 tab on most days and 1 full tab daily when needed.  . gabapentin (NEURONTIN) 600 MG tablet Take 1/2 to 1 tablet 3 to 4 x /day for Diabetic Neuropathy  . ibuprofen (ADVIL,MOTRIN) 200 MG tablet Take 800 mg by mouth as needed for moderate pain.  Marland Kitchen levothyroxine (SYNTHROID, LEVOTHROID) 125 MCG tablet TAKE 1 TABLET EVERY MORNING ON AN EMPTY STOMACH WITH ONLY WATER FOR 30 MINUTES  . Magnesium 250 MG TABS Take 250 mg by mouth daily.  . metFORMIN (GLUCOPHAGE XR) 500 MG 24 hr tablet Take 2 tablets 2 x / day for Diabetes  . omeprazole (PRILOSEC) 40 MG capsule TAKE 1 CAPSULE DAILY FOR ACID INDIGESTION  . potassium chloride (K-DUR) 10 MEQ tablet TAKE 1 TABLET (10 MEQ TOTAL) BY MOUTH EVERY OTHER DAY.  Marland Kitchen predniSONE (DELTASONE) 20 MG tablet 2 tablets daily  for 3 days, 1 tablet daily for 4 days.  . pregabalin (LYRICA) 75 MG capsule TAKE 1 CAPSULE 3 TIMES DAY FOR NEUROPATHY PAINS  . promethazine-dextromethorphan (PROMETHAZINE-DM) 6.25-15 MG/5ML syrup Take 5 mLs by mouth 4 (four) times daily as needed for cough.  . traMADol (ULTRAM) 50 MG tablet Take 1 tablet (50 mg total) by mouth every 8 (eight) hours as needed.  . montelukast (SINGULAIR) 10 MG tablet Take 1 tablet daily for Allergies   No current facility-administered medications on file prior to visit.      Allergies:  Allergies  Allergen Reactions  . Tetanus Toxoids Other (See Comments)    Injection site abcess  . Sulfa Antibiotics Hives    itching     Medical History:  Past Medical History:  Diagnosis Date  .  Diabetes mellitus without complication ([place])   . Diverticulitis   . GERD (gastroesophageal reflux disease)   . Hyperlipidemia   . IBS (irritable bowel syndrome)   . Migraine   . Pneumonia 11/2017  . Thyroid disease   . UTI (urinary tract infection)    Family history- Reviewed and unchanged Social history- Reviewed and unchanged   Review of Systems:  Review of Systems  Constitutional: Negative for malaise/fatigue and weight loss.  HENT: Negative for hearing loss and tinnitus.   Eyes: Negative for blurred vision and double vision.  Respiratory: Negative for cough, shortness of breath and wheezing.   Cardiovascular: Negative for chest pain, palpitations, orthopnea, claudication and leg swelling.  Gastrointestinal: Negative for abdominal pain, blood in stool, constipation, diarrhea, heartburn, melena, nausea and vomiting.  Genitourinary: Negative.   Musculoskeletal: Negative for joint pain and myalgias.  Skin: Negative for rash.  Neurological: Negative for dizziness, tingling, sensory change, weakness and headaches.  Endo/Heme/Allergies: Negative for polydipsia.  Psychiatric/Behavioral: Negative.   All other systems reviewed and are negative.     Physical Exam: BP 100/68   Pulse 97   Temp 97.7 F (36.5 C)   Ht 5' 1.5" (1.562 m)   Wt 176 lb (79.8 kg)   SpO2 98%   BMI 32.72 kg/m  Wt Readings from Last 3 Encounters:  05/13/18 176 lb (79.8 kg)  05/11/18 176 lb (79.8 kg)  03/16/18 172 lb (78 kg)   General Appearance: Well nourished, in no apparent distress. Eyes: PERRLA, EOMs, conjunctiva no swelling or erythema Sinuses: No Frontal/maxillary tenderness ENT/Mouth: Ext aud canals clear, TMs without erythema, bulging. No erythema, swelling, or exudate on post pharynx.  Tonsils not swollen or erythematous. Hearing normal.  Neck: Supple, thyroid normal.  Respiratory: Respiratory effort normal, BS equal bilaterally without rales, rhonchi, wheezing or stridor.  Cardio: RRR with  no MRGs. Brisk peripheral pulses without edema.  Abdomen: Soft, + BS.  Non tender, no guarding, rebound, hernias, masses. Lymphatics: Non tender without lymphadenopathy.  Musculoskeletal: Full ROM, 5/5 strength, Normal gait Skin: Warm, dry without rashes, lesions, ecchymosis.  Neuro: Cranial nerves intact. No cerebellar symptoms.  Psych: Awake and oriented X 3, normal affect, Insight and Judgment appropriate.    [name], NP 4:43 PM [facility]

## 2018-05-11 ENCOUNTER — Ambulatory Visit: Payer: Managed Care, Other (non HMO) | Admitting: Cardiology

## 2018-05-11 ENCOUNTER — Encounter: Payer: Self-pay | Admitting: Cardiology

## 2018-05-11 VITALS — BP 96/62 | HR 87 | Ht 61.5 in | Wt 176.0 lb

## 2018-05-11 DIAGNOSIS — E7800 Pure hypercholesterolemia, unspecified: Secondary | ICD-10-CM

## 2018-05-11 DIAGNOSIS — I5032 Chronic diastolic (congestive) heart failure: Secondary | ICD-10-CM | POA: Diagnosis not present

## 2018-05-11 NOTE — Patient Instructions (Signed)
Your physician wants you to follow-up in: 6 MONTHS WITH DR CRENSHAW You will receive a reminder letter in the mail two months in advance. If you don't receive a letter, please call our office to schedule the follow-up appointment.   If you need a refill on your cardiac medications before your next appointment, please call your pharmacy.  

## 2018-05-13 ENCOUNTER — Ambulatory Visit (INDEPENDENT_AMBULATORY_CARE_PROVIDER_SITE_OTHER): Payer: Managed Care, Other (non HMO) | Admitting: Adult Health

## 2018-05-13 ENCOUNTER — Encounter: Payer: Self-pay | Admitting: Adult Health

## 2018-05-13 VITALS — BP 100/68 | HR 97 | Temp 97.7°F | Ht 61.5 in | Wt 176.0 lb

## 2018-05-13 DIAGNOSIS — E1169 Type 2 diabetes mellitus with other specified complication: Secondary | ICD-10-CM

## 2018-05-13 DIAGNOSIS — I5031 Acute diastolic (congestive) heart failure: Secondary | ICD-10-CM | POA: Diagnosis not present

## 2018-05-13 DIAGNOSIS — K760 Fatty (change of) liver, not elsewhere classified: Secondary | ICD-10-CM | POA: Diagnosis not present

## 2018-05-13 DIAGNOSIS — K219 Gastro-esophageal reflux disease without esophagitis: Secondary | ICD-10-CM | POA: Diagnosis not present

## 2018-05-13 DIAGNOSIS — E559 Vitamin D deficiency, unspecified: Secondary | ICD-10-CM | POA: Diagnosis not present

## 2018-05-13 DIAGNOSIS — Z79899 Other long term (current) drug therapy: Secondary | ICD-10-CM

## 2018-05-13 DIAGNOSIS — E785 Hyperlipidemia, unspecified: Secondary | ICD-10-CM | POA: Diagnosis not present

## 2018-05-13 DIAGNOSIS — E039 Hypothyroidism, unspecified: Secondary | ICD-10-CM

## 2018-05-13 DIAGNOSIS — Z23 Encounter for immunization: Secondary | ICD-10-CM

## 2018-05-13 DIAGNOSIS — E669 Obesity, unspecified: Secondary | ICD-10-CM

## 2018-05-13 DIAGNOSIS — E118 Type 2 diabetes mellitus with unspecified complications: Secondary | ICD-10-CM

## 2018-05-13 MED ORDER — PHENTERMINE HCL 37.5 MG PO TABS: ORAL_TABLET | ORAL | 2 refills | Status: DC

## 2018-05-13 MED ORDER — CYANOCOBALAMIN 1000 MCG/ML IJ SOLN: 1000.0000 ug | INTRAMUSCULAR | 1 refills | Status: DC

## 2018-05-13 MED ORDER — EMPAGLIFLOZIN 25 MG PO TABS: 25.0000 mg | ORAL_TABLET | Freq: Every day | ORAL | 1 refills | Status: DC

## 2018-05-13 MED ORDER — PRAVASTATIN SODIUM 20 MG PO TABS: 20.0000 mg | ORAL_TABLET | Freq: Every day | ORAL | 1 refills | Status: DC

## 2018-05-13 NOTE — Patient Instructions (Signed)
Goals    . HEMOGLOBIN A1C < 7.0    . LDL CALC < 70    . Weight (lb) < 155 lb (70.3 kg)       Start jardiance 10 mg daily x 1 week, then increase to 25 mg daily - this may reduce your blood pressure, and may be able to cut back further on diuretic   Start pravastatin 20 mg daily for cholesterol - take in the evening for best benefit, and monitor for muscle aches/sudden and severe fatigue  Start phentermine carefully, be aware that this can increase stress on your heart - start with 1/2 tab at first, monitor closely, take regular drug breaks for best benefit    Drink 1/2 your body weight in fluid ounces of water daily; drink a tall glass of water 30 min before meals  Don't eat until you're stuffed- listen to your stomach and eat until you are 80% full   Try eating off of a salad plate; wait 10 min after finishing before going back for seconds  Start by eating the vegetables on your plate; aim for 60% of your meals to be fruits or vegetables  Then eat your protein - lean meats (grass fed if possible), fish, beans, nuts in moderation  Eat your carbs/starch last ONLY if you still are hungry. If you can, stop before finishing it all  Avoid sugar and flour - the closer it looks to it's original form in nature, typically the better it is for you  Splurge in moderation - "assign" days when you get to splurge and have the "bad stuff" - I like to follow a 80% - 20% plan- "good" choices 80 % of the time, "bad" choices in moderation 20% of the time  Simple equation is: Calories out > calories in = weight loss - even if you eat the bad stuff, if you limit portions, you will still lose weight     Empagliflozin oral tablets What is this medicine? EMPAGLIFLOZIN (EM pa gli FLOE zin) helps to treat type 2 diabetes. It helps to control blood sugar. This drug may also reduce the risk of heart attack or stroke if you have type 2 diabetes and risk factors for heart disease. Treatment is combined with  diet and exercise. This medicine may be used for other purposes; ask your health care provider or pharmacist if you have questions. COMMON BRAND NAME(S): JARDIANCE What should I tell my health care provider before I take this medicine? They need to know if you have any of these conditions: -dehydration -diabetic ketoacidosis -diet low in salt -eating less due to illness, surgery, dieting, or any other reason -having surgery -high cholesterol -high levels of potassium in the blood -history of pancreatitis or pancreas problems -history of yeast infection of the penis or vagina -if you often drink alcohol -infections in the bladder, kidneys, or urinary tract -kidney disease -liver disease -low blood pressure -on hemodialysis -problems urinating -type 1 diabetes -uncircumcised female -an unusual or allergic reaction to empagliflozin, other medicines, foods, dyes, or preservatives -pregnant or trying to get pregnant -breast-feeding How should I use this medicine? Take this medicine by mouth with a glass of water. Follow the directions on the prescription label. Take it in the morning, with or without food. Take your dose at the same time each day. Do not take more often than directed. Do not stop taking except on your doctor's advice. Talk to your pediatrician regarding the use of this medicine in children.  Special care may be needed. Overdosage: If you think you have taken too much of this medicine contact a poison control center or emergency room at once. NOTE: This medicine is only for you. Do not share this medicine with others. What if I miss a dose? If you miss a dose, take it as soon as you can. If it is almost time for your next dose, take only that dose. Do not take double or extra doses. What may interact with this medicine? Do not take this medicine with any of the following medications: -gatifloxacin This medicine may also interact with the following  medications: -alcohol -certain medicines for blood pressure, heart disease -diuretics This list may not describe all possible interactions. Give your health care provider a list of all the medicines, herbs, non-prescription drugs, or dietary supplements you use. Also tell them if you smoke, drink alcohol, or use illegal drugs. Some items may interact with your medicine. What should I watch for while using this medicine? Visit your doctor or health care professional for regular checks on your progress. This medicine can cause a serious condition in which there is too much acid in the blood. If you develop nausea, vomiting, stomach pain, unusual tiredness, or breathing problems, stop taking this medicine and call your doctor right away. If possible, use a ketone dipstick to check for ketones in your urine. A test called the HbA1C (A1C) will be monitored. This is a simple blood test. It measures your blood sugar control over the last 2 to 3 months. You will receive this test every 3 to 6 months. Learn how to check your blood sugar. Learn the symptoms of low and high blood sugar and how to manage them. Always carry a quick-source of sugar with you in case you have symptoms of low blood sugar. Examples include hard sugar candy or glucose tablets. Make sure others know that you can choke if you eat or drink when you develop serious symptoms of low blood sugar, such as seizures or unconsciousness. They must get medical help at once. Tell your doctor or health care professional if you have high blood sugar. You might need to change the dose of your medicine. If you are sick or exercising more than usual, you might need to change the dose of your medicine. Do not skip meals. Ask your doctor or health care professional if you should avoid alcohol. Many nonprescription cough and cold products contain sugar or alcohol. These can affect blood sugar. Wear a medical ID bracelet or chain, and carry a card that  describes your disease and details of your medicine and dosage times. What side effects may I notice from receiving this medicine? Side effects that you should report to your doctor or health care professional as soon as possible: -allergic reactions like skin rash, itching or hives, swelling of the face, lips, or tongue -breathing problems -dizziness -fast or irregular heartbeat -feeling faint or lightheaded, falls -muscle weakness -nausea, vomiting, unusual stomach upset or pain -signs and symptoms of low blood sugar such as feeling anxious, confusion, dizziness, increased hunger, unusually weak or tired, sweating, shakiness, cold, irritable, headache, blurred vision, fast heartbeat, loss of consciousness -signs and symptoms of a urinary tract infection, such as fever, chills, a burning feeling when urinating, blood in the urine, back pain -trouble passing urine or change in the amount of urine, including an urgent need to urinate more often, in larger amounts, or at night -penile discharge, itching, or pain in men -unusual  tiredness -vaginal discharge, itching, or odor in women Side effects that usually do not require medical attention (report to your doctor or health care professional if they continue or are bothersome): -joint pain -mild increase in urination -thirsty This list may not describe all possible side effects. Call your doctor for medical advice about side effects. You may report side effects to FDA at 1-800-FDA-1088. Where should I keep my medicine? Keep out of the reach of children. Store at room temperature between 20 and 25 degrees C (68 and 77 degrees F). Throw away any unused medicine after the expiration date. NOTE: This sheet is a summary. It may not cover all possible information. If you have questions about this medicine, talk to your doctor, pharmacist, or health care provider.  2018 Elsevier/Gold Standard (2015-08-31 11:46:10)   Pravastatin tablets What is  this medicine? PRAVASTATIN (PRA va stat in) is known as a HMG-CoA reductase inhibitor or 'statin'. It lowers the level of cholesterol and triglycerides in the blood. This drug may also reduce the risk of heart attack, stroke, or other health problems in patients with risk factors for heart disease. Diet and lifestyle changes are often used with this drug. This medicine may be used for other purposes; ask your health care provider or pharmacist if you have questions. COMMON BRAND NAME(S): Pravachol What should I tell my health care provider before I take this medicine? They need to know if you have any of these conditions: -frequently drink alcoholic beverages -kidney disease -liver disease -muscle aches or weakness -other medical condition -an unusual or allergic reaction to pravastatin, other medicines, foods, dyes, or preservatives -pregnant or trying to get pregnant -breast-feeding How should I use this medicine? Take pravastatin tablets by mouth. Swallow the tablets with a drink of water. Pravastatin can be taken at anytime of the day, with or without food. Follow the directions on the prescription label. Take your doses at regular intervals. Do not take your medicine more often than directed. Talk to your pediatrician regarding the use of this medicine in children. Special care may be needed. Pravastatin has been used in children as young as 36 years of age. Overdosage: If you think you have taken too much of this medicine contact a poison control center or emergency room at once. NOTE: This medicine is only for you. Do not share this medicine with others. What if I miss a dose? If you miss a dose, take it as soon as you can. If it is almost time for your next dose, take only that dose. Do not take double or extra doses. What may interact with this medicine? This medicine may interact with the following medications: -colchicine -cyclosporine -other medicines for high cholesterol -some  antibiotics like azithromycin, clarithromycin, erythromycin, and telithromycin This list may not describe all possible interactions. Give your health care provider a list of all the medicines, herbs, non-prescription drugs, or dietary supplements you use. Also tell them if you smoke, drink alcohol, or use illegal drugs. Some items may interact with your medicine. What should I watch for while using this medicine? Visit your doctor or health care professional for regular check-ups. You may need regular tests to make sure your liver is working properly. Tell your doctor or health care professional right away if you get any unexplained muscle pain, tenderness, or weakness, especially if you also have a fever and tiredness. Your doctor or health care professional may tell you to stop taking this medicine if you develop muscle problems. If  your muscle problems do not go away after stopping this medicine, contact your health care professional. This drug is only part of a total heart-health program. Your doctor or a dietician can suggest a low-cholesterol and low-fat diet to help. Avoid alcohol and smoking, and keep a proper exercise schedule. Do not use this drug if you are pregnant or breast-feeding. Serious side effects to an unborn child or to an infant are possible. Talk to your doctor or pharmacist for more information. This medicine may affect blood sugar levels. If you have diabetes, check with your doctor or health care professional before you change your diet or the dose of your diabetic medicine. If you are going to have surgery tell your health care professional that you are taking this drug. What side effects may I notice from receiving this medicine? Side effects that you should report to your doctor or health care professional as soon as possible: -allergic reactions like skin rash, itching or hives, swelling of the face, lips, or tongue -dark urine -fever -muscle pain, cramps, or  weakness -redness, blistering, peeling or loosening of the skin, including inside the mouth -trouble passing urine or change in the amount of urine -unusually weak or tired -yellowing of the eyes or skin Side effects that usually do not require medical attention (report to your doctor or health care professional if they continue or are bothersome): -gas -headache -heartburn -indigestion -stomach pain This list may not describe all possible side effects. Call your doctor for medical advice about side effects. You may report side effects to FDA at 1-800-FDA-1088. Where should I keep my medicine? Keep out of the reach of children. Store at room temperature between 15 to 30 degrees C (59 to 86 degrees F). Protect from light. Keep container tightly closed. Throw away any unused medicine after the expiration date. NOTE: This sheet is a summary. It may not cover all possible information. If you have questions about this medicine, talk to your doctor, pharmacist, or health care provider.  2018 Elsevier/Gold Standard (2015-02-23 16:00:27)    Phentermine tablets or capsules What is this medicine? PHENTERMINE (FEN ter meen) decreases your appetite. It is used with a reduced calorie diet and exercise to help you lose weight. This medicine may be used for other purposes; ask your health care provider or pharmacist if you have questions. COMMON BRAND NAME(S): Adipex-P, Atti-Plex P, Atti-Plex P Spansule, Fastin, Lomaira, Pro-Fast, Tara-8 What should I tell my health care provider before I take this medicine? They need to know if you have any of these conditions: -agitation -glaucoma -heart disease -high blood pressure -history of substance abuse -lung disease called Primary Pulmonary Hypertension (PPH) -taken an MAOI like Carbex, Eldepryl, Marplan, Nardil, or Parnate in last 14 days -thyroid disease -an unusual or allergic reaction to phentermine, other medicines, foods, dyes, or  preservatives -pregnant or trying to get pregnant -breast-feeding How should I use this medicine? Take this medicine by mouth with a glass of water. Follow the directions on the prescription label. The instructions for use may differ based on the product and dose you are taking. Avoid taking this medicine in the evening. It may interfere with sleep. Take your doses at regular intervals. Do not take your medicine more often than directed. Talk to your pediatrician regarding the use of this medicine in children. While this drug may be prescribed for children 17 years or older for selected conditions, precautions do apply. Overdosage: If you think you have taken too much of this  medicine contact a poison control center or emergency room at once. NOTE: This medicine is only for you. Do not share this medicine with others. What if I miss a dose? If you miss a dose, take it as soon as you can. If it is almost time for your next dose, take only that dose. Do not take double or extra doses. What may interact with this medicine? Do not take this medicine with any of the following medications: -duloxetine -MAOIs like Carbex, Eldepryl, Marplan, Nardil, and Parnate -medicines for colds or breathing difficulties like pseudoephedrine or phenylephrine -procarbazine -sibutramine -SSRIs like citalopram, escitalopram, fluoxetine, fluvoxamine, paroxetine, and sertraline -stimulants like dexmethylphenidate, methylphenidate or modafinil -venlafaxine This medicine may also interact with the following medications: -medicines for diabetes This list may not describe all possible interactions. Give your health care provider a list of all the medicines, herbs, non-prescription drugs, or dietary supplements you use. Also tell them if you smoke, drink alcohol, or use illegal drugs. Some items may interact with your medicine. What should I watch for while using this medicine? Notify your physician immediately if you  become short of breath while doing your normal activities. Do not take this medicine within 6 hours of bedtime. It can keep you from getting to sleep. Avoid drinks that contain caffeine and try to stick to a regular bedtime every night. This medicine was intended to be used in addition to a healthy diet and exercise. The best results are achieved this way. This medicine is only indicated for short-term use. Eventually your weight loss may level out. At that point, the drug will only help you maintain your new weight. Do not increase or in any way change your dose without consulting your doctor. You may get drowsy or dizzy. Do not drive, use machinery, or do anything that needs mental alertness until you know how this medicine affects you. Do not stand or sit up quickly, especially if you are an older patient. This reduces the risk of dizzy or fainting spells. Alcohol may increase dizziness and drowsiness. Avoid alcoholic drinks. What side effects may I notice from receiving this medicine? Side effects that you should report to your doctor or health care professional as soon as possible: -chest pain, palpitations -depression or severe changes in mood -increased blood pressure -irritability -nervousness or restlessness -severe dizziness -shortness of breath -problems urinating -unusual swelling of the legs -vomiting Side effects that usually do not require medical attention (report to your doctor or health care professional if they continue or are bothersome): -blurred vision or other eye problems -changes in sexual ability or desire -constipation or diarrhea -difficulty sleeping -dry mouth or unpleasant taste -headache -nausea This list may not describe all possible side effects. Call your doctor for medical advice about side effects. You may report side effects to FDA at 1-800-FDA-1088. Where should I keep my medicine? Keep out of the reach of children. This medicine can be abused. Keep  your medicine in a safe place to protect it from theft. Do not share this medicine with anyone. Selling or giving away this medicine is dangerous and against the law. This medicine may cause accidental overdose and death if taken by other adults, children, or pets. Mix any unused medicine with a substance like cat litter or coffee grounds. Then throw the medicine away in a sealed container like a sealed bag or a coffee can with a lid. Do not use the medicine after the expiration date. Store at room temperature between 20 and  25 degrees C (68 and 77 degrees F). Keep container tightly closed. NOTE: This sheet is a summary. It may not cover all possible information. If you have questions about this medicine, talk to your doctor, pharmacist, or health care provider.  2018 Elsevier/Gold Standard (2015-05-05 12:53:15)

## 2018-05-14 ENCOUNTER — Ambulatory Visit: Payer: Managed Care, Other (non HMO) | Admitting: Podiatry

## 2018-05-14 DIAGNOSIS — Z23 Encounter for immunization: Secondary | ICD-10-CM

## 2018-05-14 LAB — LIPID PANEL
Cholesterol: 150 mg/dL (ref <200)
HDL: 37 mg/dL — ABNORMAL LOW (ref >50)
Non-HDL Cholesterol (Calc): 113 mg/dL (calc) (ref <130)
Total CHOL/HDL Ratio: 4.1 (calc) (ref <5.0)
Triglycerides: 480 mg/dL — ABNORMAL HIGH (ref <150)

## 2018-05-14 LAB — COMPLETE METABOLIC PANEL WITH GFR
AG Ratio: 2 (calc) (ref 1.0–2.5)
ALT: 37 U/L — ABNORMAL HIGH (ref 6–29)
AST: 28 U/L (ref 10–35)
Albumin: 4.7 g/dL (ref 3.6–5.1)
Alkaline phosphatase (APISO): 104 U/L (ref 33–130)
BUN: 15 mg/dL (ref 7–25)
CO2: 26 mmol/L (ref 20–32)
Calcium: 10.8 mg/dL — ABNORMAL HIGH (ref 8.6–10.4)
Chloride: 97 mmol/L — ABNORMAL LOW (ref 98–110)
Creat: 0.7 mg/dL (ref 0.50–1.05)
GFR, Est African American: 116 mL/min/{1.73_m2} (ref >=60)
GFR, Est Non African American: 100 mL/min/{1.73_m2} (ref >=60)
Globulin: 2.4 g/dL (calc) (ref 1.9–3.7)
Glucose, Bld: 238 mg/dL — ABNORMAL HIGH (ref 65–99)
Potassium: 3.6 mmol/L (ref 3.5–5.3)
Sodium: 137 mmol/L (ref 135–146)
Total Bilirubin: 0.3 mg/dL (ref 0.2–1.2)
Total Protein: 7.1 g/dL (ref 6.1–8.1)

## 2018-05-14 LAB — VITAMIN D 25 HYDROXY (VIT D DEFICIENCY, FRACTURES): Vit D, 25-Hydroxy: 58 ng/mL (ref 30–100)

## 2018-05-14 LAB — CBC WITH DIFFERENTIAL/PLATELET
Basophils Absolute: 52 cells/uL (ref 0–200)
Basophils Relative: 0.7 %
Eosinophils Absolute: 222 cells/uL (ref 15–500)
Eosinophils Relative: 3 %
HCT: 39.4 % (ref 35.0–45.0)
Hemoglobin: 13.4 g/dL (ref 11.7–15.5)
Lymphs Abs: 2464 cells/uL (ref 850–3900)
MCH: 30.7 pg (ref 27.0–33.0)
MCHC: 34 g/dL (ref 32.0–36.0)
MCV: 90.4 fL (ref 80.0–100.0)
MPV: 11 fL (ref 7.5–12.5)
Monocytes Relative: 9.4 %
Neutro Abs: 3966 cells/uL (ref 1500–7800)
Neutrophils Relative %: 53.6 %
Platelets: 336 10*3/uL (ref 140–400)
RBC: 4.36 10*6/uL (ref 3.80–5.10)
RDW: 12.4 % (ref 11.0–15.0)
Total Lymphocyte: 33.3 %
WBC mixed population: 696 cells/uL (ref 200–950)
WBC: 7.4 10*3/uL (ref 3.8–10.8)

## 2018-05-14 LAB — TSH: TSH: 0.61 mIU/L

## 2018-05-14 LAB — HEMOGLOBIN A1C
Hgb A1c MFr Bld: 7.7 % of total Hgb — ABNORMAL HIGH (ref <5.7)
Mean Plasma Glucose: 174 (calc)
eAG (mmol/L): 9.7 (calc)

## 2018-05-14 LAB — MAGNESIUM: Magnesium: 1.6 mg/dL (ref 1.5–2.5)

## 2018-05-14 NOTE — Addendum Note (Signed)
Addended by: Dionicio Stall on: 05/14/2018 09:13 AM   Modules accepted: Orders

## 2018-06-24 ENCOUNTER — Other Ambulatory Visit: Payer: Self-pay | Admitting: Internal Medicine

## 2018-06-24 DIAGNOSIS — K219 Gastro-esophageal reflux disease without esophagitis: Secondary | ICD-10-CM

## 2018-06-26 ENCOUNTER — Other Ambulatory Visit: Payer: Self-pay | Admitting: Adult Health

## 2018-07-24 ENCOUNTER — Other Ambulatory Visit: Payer: Self-pay | Admitting: Cardiovascular Disease

## 2018-07-24 ENCOUNTER — Other Ambulatory Visit: Payer: Self-pay | Admitting: Internal Medicine

## 2018-07-24 DIAGNOSIS — E039 Hypothyroidism, unspecified: Secondary | ICD-10-CM

## 2018-07-24 NOTE — Telephone Encounter (Signed)
Rx request sent to pharmacy.  

## 2018-07-29 ENCOUNTER — Other Ambulatory Visit: Payer: Self-pay

## 2018-07-29 ENCOUNTER — Other Ambulatory Visit: Payer: Self-pay | Admitting: Adult Health

## 2018-07-29 ENCOUNTER — Other Ambulatory Visit: Payer: Self-pay | Admitting: Internal Medicine

## 2018-07-29 DIAGNOSIS — R51 Headache: Principal | ICD-10-CM

## 2018-07-29 DIAGNOSIS — R519 Headache, unspecified: Secondary | ICD-10-CM

## 2018-07-29 DIAGNOSIS — R062 Wheezing: Secondary | ICD-10-CM

## 2018-07-29 MED ORDER — VALACYCLOVIR HCL 500 MG PO TABS: 500.0000 mg | ORAL_TABLET | Freq: Two times a day (BID) | ORAL | 1 refills | Status: DC

## 2018-08-10 NOTE — Progress Notes (Signed)
Subjective:    Patient ID: Caitlin Barnett, female    DOB: December 04, 1965, 52 y.o.   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 She has hx/o chronic Lt problems & several surgeries. Denies fever, choills, sweats, rash or dyspnea.  Marland Kitchen. FLONASE nasal spray Place 1 spray into both nostrils daily.  . furosemide  40 MG  TAKE 40 MG DAILY.   Marland Kitchen. ibuprofen  200 MG tablet Take 800 mg  as needed for moderate pain.  Marland Kitchen. levothyroxine 125 MCG  TAKE 1 TABLET EVERY MORNING   . Magnesium 250 MG Take 250 mg by mouth daily.  . metFORMIN-XR 500 MG  Take 2 tablets 2 x / day for Diabetes  . montelukast  10 MG  Take 1 tablet daily for Allergies  . Omeprazole 40 MG TAKE 1 CAPSULE DAILY FOR ACID INDIGESTION  . phentermine  37.5 MG  Take 1/2 to 1 tablet every morning for dieting & weightloss  . K-DUR 10 MEQ  Take 1 tablet daily or as directed  . pravastatin 20 MG tablet Take 1 tablet  daily.  . pregabalin  75 MG capsule TAKE 1 CAPSULE 3 TIMES DAY FOR NEUROPATHY PAINS  . PROMETHAZINE-DM  syrup Take 5 mLs  4 times daily as needed for cough.  . traMADol  50 MG tablet Take 1 tablet  every 8  hours as needed.  . valACYclovir  500 MG tablet Take 1 tablet  2  times daily.    Allergies  Allergen Reactions  . Tetanus Toxoids Other (See Comments)    Injection site abcess  . Sulfa Antibiotics Hives    itching   Past Medical History:  Diagnosis Date  . Diabetes mellitus without complication (HCC)   . Diverticulitis   . GERD (gastroesophageal reflux disease)   . Hyperlipidemia   . IBS (irritable bowel syndrome)   . Migraine   . Pneumonia 11/2017  . Thyroid disease   . UTI (urinary tract infection)    Review of Systems    10 point systems review negative except as above.    Objective:   Physical Exam Ht 5' 1.5" (1.562 m)   BMI 32.72 kg/m   In No Distress. No stridor. Speech hoarse. Cough congested.   HEENT - Eac's  patent.  RE TM Nl & Left TM dull red anteriorly and bulging. EOM's full. PERRLA.  NasoOroPharynx clear. Fronto-maxillary sinuses tender. Neck - supple. Nl Thyroid. Chest - Clear equal BS w/o rales, rhonchi, wheezes. Cor - Nl HS. RRR w/o sig m. No edema. MS- FROM w/o deformities. Muscle power, tone and bulk Nl. Gait Nl. Neuro - No obvious Cr N abnormalities. Sensory, motor and Cerebellar functions appear Nl w/o focal abnormalities. Skin - exposed clear w/o rash cyanosis or icterus.    Assessment & Plan:   1. 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 Other infective chronic otitis externa  of left ear  - levofloxacin (LEVAQUIN) 500 MG tablet; Take 1 tablet daily with food for infection  Dispense: 15 tablet; Refill: 1  - doxycycline (VIBRAMYCIN) 100 MG capsule; Take 1 capsule 2 x /day with food for infection  Dispense: 30 capsule; Refill: 1  - neomycin-polymyxin-hydrocortisone (CORTISPORIN) 3.5-10000-1 OTIC suspension; Instill 6 to 8 drops to the infected ear 2 to 4 x /day  Dispense: 10 mL; Refill: 0  3. Acute non-recurrent pansinusitis  - levofloxacin (LEVAQUIN) 500 MG tablet; Take 1 tablet daily with food for infection  Dispense: 15 tablet; Refill: 1  - doxycycline (VIBRAMYCIN) 100 MG capsule; Take 1 capsule 2 x /day with food for infection  Dispense: 30 capsule; Refill: 1  - dexamethasone (DECADRON) 1 MG tablet; Take 1 tab 3 x day - 3 days, then 2 x day - 3 days, then 1 tab daily  Dispense: 20 tablet; Refill: 1

## 2018-08-10 NOTE — Progress Notes (Signed)
Patient ID: Caitlin CheekSusan Barnett, female   DOB: [birth year], 52 y.o.   MRN: [id number]

## 2018-08-11 ENCOUNTER — Ambulatory Visit: Payer: Managed Care, Other (non HMO) | Admitting: Internal Medicine

## 2018-08-11 VITALS — BP 100/60 | HR 88 | Temp 97.8°F | Resp 16 | Ht 61.5 in | Wt 170.6 lb

## 2018-08-11 DIAGNOSIS — J0140 Acute pansinusitis, unspecified: Secondary | ICD-10-CM | POA: Diagnosis not present

## 2018-08-11 DIAGNOSIS — H663X2 Other chronic suppurative otitis media, left ear: Secondary | ICD-10-CM | POA: Diagnosis not present

## 2018-08-11 DIAGNOSIS — H60392 Other infective otitis externa, left ear: Secondary | ICD-10-CM

## 2018-08-11 MED ORDER — DEXAMETHASONE 1 MG PO TABS: ORAL_TABLET | ORAL | 1 refills | Status: DC

## 2018-08-11 MED ORDER — NEOMYCIN-POLYMYXIN-HC 3.5-10000-1 OT SUSP: OTIC | 0 refills | Status: DC

## 2018-08-11 MED ORDER — DOXYCYCLINE HYCLATE 100 MG PO CAPS: ORAL_CAPSULE | ORAL | 1 refills | Status: DC

## 2018-08-11 MED ORDER — LEVOFLOXACIN 500 MG PO TABS: ORAL_TABLET | ORAL | 1 refills | Status: DC

## 2018-08-12 ENCOUNTER — Encounter: Payer: Self-pay | Admitting: Internal Medicine

## 2018-08-12 DIAGNOSIS — H663X2 Other chronic suppurative otitis media, left ear: Secondary | ICD-10-CM | POA: Insufficient documentation

## 2018-08-12 DIAGNOSIS — H6090 Unspecified otitis externa, unspecified ear: Secondary | ICD-10-CM | POA: Insufficient documentation

## 2018-08-19 ENCOUNTER — Encounter: Payer: Self-pay | Admitting: Internal Medicine

## 2018-08-19 NOTE — Progress Notes (Signed)
[facility] Unk Pinto, M.D.     Uvaldo Bristle. Silverio Lay, P.A.-C Liane Comber, [place] [address] [place], [place] [address] Telephone [phone] Telefax [phone] Annual Screening/Preventative Visit & Comprehensive Evaluation &  Examination     This very nice 53 y.o. MWF presents for a Screening / Preventative Visit & comprehensive evaluation and management of multiple medical co-morbidities.  Patient has been followed for HTN, HLD, T2_NIDDM  and Vitamin D Deficiency. Patient has GERD controlled w/her meds.      Patient is monitored expectantly for labile  HTN. Patient's BP has been controlled at home and patient denies any cardiac symptoms as chest pain, palpitations, shortness of breath, dizziness or ankle swelling. Today's BP is at goal - 100/62      Patient's hyperlipidemia is not controlled with diet and medications. Patient denies myalgias or other medication SE's. Last lipids were at goal albeit very elevated Trig's: Lab Results  Component Value Date   CHOL 150 05/13/2018   HDL 37 (L) 05/13/2018   LDLCALC not calculated 05/13/2018   TRIG 480 (H) 05/13/2018   CHOLHDL 4.1 05/13/2018      Patient has remote hx/o  Gestational Diabetes ([place]), the homeymooned until relapsed in 2016 developing T2_NIDDM & was started on Metformin.  She denies reactive hypoglycemic symptoms, visual blurring, diabetic polys or paresthesias. Last A1c was not at goal: Lab Results  Component Value Date   HGBA1C 6.7 (H) 08/20/2018      Finally, patient has history of Vitamin D Deficiency ("8" / June 2018) and last Vitamin D was near goal: Lab Results  Component Value Date   VD25OH 48 08/20/2018   Current Outpatient Medications on File Prior to Visit  Medication Sig  . albuterol (PROVENTIL HFA;VENTOLIN HFA) 108 (90 Base) MCG/ACT inhaler 2 inhalations  15-20  minutes apart every 4 hours as needed to rescue Asthma   . albuterol (PROVENTIL) (2.5 MG/3ML) 0.083% nebulizer solution Take 3 mLs (2.5 mg total) by nebulization every 6 (six) hours as needed for wheezing or shortness of breath.  Marland Kitchen aspirin EC 81 MG tablet Take 81 mg by mouth daily.  . blood glucose meter kit and supplies KIT Dispense based on patient and insurance preference. Use up to four times daily as directed. (FOR ICD-9 250.00, 250.01).  . butalbital-acetaminophen-caffeine (FIORICET, ESGIC) 50-325-40 MG tablet TAKE 1/2 TO 1 TABLET EVERY 4 HOURS IF NEEDED FOR HEADACHE  . cetirizine (ZYRTEC) 10 MG tablet Take 10 mg by mouth daily.  . Cholecalciferol 1000 units capsule Take 5,000 Units by mouth 2 (two) times daily.   . Cinnamon 500 MG TABS Take 2 capsules by mouth 2 (two) times daily.  . cyanocobalamin (,VITAMIN B-12,) 1000 MCG/ML injection Inject 1 mL (1,000 mcg total) into the muscle every 30 (thirty) days.  Marland Kitchen dexamethasone (DECADRON) 1 MG tablet Take 1 tab 3 x day - 3 days, then 2 x day - 3 days, then 1 tab daily  . doxycycline (VIBRAMYCIN) 100 MG capsule Take 1 capsule 2 x /day with food for infection  . empagliflozin (JARDIANCE) 25 MG TABS tablet Take 25 mg by mouth daily.  Marland Kitchen estrogen, conjugated,-medroxyprogesterone (PREMPRO) 0.45-1.5 MG tablet TAKE 1 TABLET BY MOUTH EVERY DAY  . fenofibrate (TRICOR) 145 MG tablet TAKE 1 TABLET BY MOUTH EVERY DAY  . fluticasone (FLONASE) 50 MCG/ACT nasal spray Place 1 spray into both nostrils daily.  . furosemide (LASIX) 40 MG tablet TAKE 40 MG (1  TAB) BY MOUTH DAILY. MAY TAKE 1/2 TAB ON MOST DAYS AND 1 FULL TAB DAILY WHEN NEEDED.  Marland Kitchen ibuprofen (ADVIL,MOTRIN) 200 MG tablet Take 800 mg by mouth as needed for moderate pain.  Marland Kitchen levofloxacin (LEVAQUIN) 500 MG tablet Take 1 tablet daily with food for infection  . levothyroxine (SYNTHROID, LEVOTHROID) 125 MCG tablet TAKE 1 TABLET EVERY MORNING ON AN EMPTY STOMACH WITH ONLY WATER FOR 30 MINUTES  . Magnesium 250 MG TABS Take 250 mg by mouth daily.  . metFORMIN  (GLUCOPHAGE XR) 500 MG 24 hr tablet Take 2 tablets 2 x / day for Diabetes  . neomycin-polymyxin-hydrocortisone (CORTISPORIN) 3.5-10000-1 OTIC suspension Instill 6 to 8 drops to the infected ear 2 to 4 x /day  . omeprazole (PRILOSEC) 40 MG capsule TAKE 1 CAPSULE DAILY FOR ACID INDIGESTION  . potassium chloride (K-DUR) 10 MEQ tablet Take 1 tablet daily or as directed  . pravastatin (PRAVACHOL) 20 MG tablet Take 1 tablet (20 mg total) by mouth daily.  . pregabalin (LYRICA) 75 MG capsule TAKE 1 CAPSULE 3 TIMES DAY FOR NEUROPATHY PAINS  . promethazine-dextromethorphan (PROMETHAZINE-DM) 6.25-15 MG/5ML syrup Take 5 mLs by mouth 4 (four) times daily as needed for cough.  . valACYclovir (VALTREX) 500 MG tablet Take 1 tablet (500 mg total) by mouth 2 (two) times daily. For 3-5 days until resolution of cold sore. Repeat as needed for outbreak.  . montelukast (SINGULAIR) 10 MG tablet Take 1 tablet daily for Allergies   No current facility-administered medications on file prior to visit.    Allergies  Allergen Reactions  . Tetanus Toxoids Other (See Comments)    Injection site abcess  . Sulfa Antibiotics Hives    itching   Past Medical History:  Diagnosis Date  . Diabetes mellitus without complication ([place])   . Diverticulitis   . GERD (gastroesophageal reflux disease)   . Hyperlipidemia   . IBS (irritable bowel syndrome)   . Migraine   . Pneumonia 11/2017  . Thyroid disease   . UTI (urinary tract infection)    Health Maintenance  Topic Date Due  . OPHTHALMOLOGY EXAM  03/22/1977  . TETANUS/TDAP  03/22/1986  . MAMMOGRAM  03/22/2017  . COLONOSCOPY  03/22/2017  . HEMOGLOBIN A1C  02/18/2019  . FOOT EXAM  08/21/2019  . URINE MICROALBUMIN  08/21/2019  . PAP SMEAR-Modifier  02/25/2020  . INFLUENZA VACCINE  Completed  . PNEUMOCOCCAL POLYSACCHARIDE VACCINE AGE 57-64 HIGH RISK  Completed  . HIV Screening  Completed   Immunization History  Administered Date(s) Administered  . Influenza Inj Mdck  Quad With Preservative 07/22/2017, 05/14/2018  . Pneumococcal Polysaccharide-23 08/20/2018   Last Colon - 2016 in [place] - dx IBS - recc f/u 5 yrs due 2021.   Last MGM - Last in [place] - overdue - Ordered Diag due to c/o bilat breast pains.  Past Surgical History:  Procedure Laterality Date  . CESAREAN SECTION  1993  . cystoscopies     multiple  . FUNCTIONAL ENDOSCOPIC SINUS SURGERY  2003  . LAPAROSCOPIC OVARIAN CYSTECTOMY  1983  . TONSILLECTOMY  1973  . TYMPANOPLASTY W/ MASTOIDECTOMY Left 2016 & 2017   Family History  Problem Relation Age of Onset  . Mental illness Mother        depression  . Suicidality Mother   . Alcohol abuse Father   . Kidney disease Father   . Mental illness Father        depression  . Suicidality Father   . Breast cancer  Maternal Aunt        breast cancer  . Heart disease Maternal Grandmother   . Breast cancer Maternal Grandmother   . Suicidality Maternal Grandfather   . Stroke Paternal Grandmother   . Heart disease Paternal Grandmother   . Heart disease Paternal Grandfather    Social History   Tobacco Use  . Smoking status: Former Smoker    Packs/day: 0.50    Years: 30.00    Pack years: 15.00    Types: Cigarettes    Last attempt to quit: 08/24/2017    Years since quitting: 0.9  . Smokeless tobacco: Never Used  Substance Use Topics  . Alcohol use: No  . Drug use: No    ROS Constitutional: Denies fever, chills, weight loss/gain, headaches, insomnia,  night sweats, and change in appetite. Does c/o fatigue. Eyes: Denies redness, blurred vision, diplopia, discharge, itchy, watery eyes.  ENT: Denies discharge, congestion, post nasal drip, epistaxis, sore throat, earache, hearing loss, dental pain, Tinnitus, Vertigo, Sinus pain, snoring.  Cardio: Denies chest pain, palpitations, irregular heartbeat, syncope, dyspnea, diaphoresis, orthopnea, PND, claudication, edema Respiratory: denies cough, dyspnea, DOE, pleurisy, hoarseness, laryngitis,  wheezing.  Gastrointestinal: Denies dysphagia, heartburn, reflux, water brash, pain, cramps, nausea, vomiting, bloating, diarrhea, constipation, hematemesis, melena, hematochezia, jaundice, hemorrhoids Genitourinary: Denies dysuria, frequency, urgency, nocturia, hesitancy, discharge, hematuria, flank pain Breast: c/o bilat breat pain , denies breast lumps, nipple discharge, bleeding.  Musculoskeletal: Denies arthralgia, myalgia, stiffness, Jt. Swelling, pain, limp, and strain/sprain. Denies falls. Skin: Denies puritis, rash, hives, warts, acne, eczema, changing in skin lesion Neuro: No weakness, tremor, incoordination, spasms, paresthesia, pain Psychiatric: Denies confusion, memory loss, sensory loss. Denies Depression. Endocrine: Denies change in weight, skin, hair change, nocturia, and paresthesia, diabetic polys, visual blurring, hyper / hypo glycemic episodes.  Heme/Lymph: No excessive bleeding, bruising, enlarged lymph nodes.  Physical Exam  BP 100/62   Pulse 76   Temp 97.7 F (36.5 C)   Resp 16   Ht 5' 2.25" (1.581 m)   Wt 170 lb 12.8 oz (77.5 kg)   BMI 30.99 kg/m   General Appearance: Over nourished, well groomed and in no apparent distress.  Eyes: PERRLA, EOMs, conjunctiva no swelling or erythema, normal fundi and vessels. Sinuses: No frontal/maxillary tenderness ENT/Mouth: EACs patent / TMs  nl. Nares clear without erythema, swelling, mucoid exudates. Oral hygiene is good. No erythema, swelling, or exudate. Tongue normal, non-obstructing. Tonsils not swollen or erythematous. Hearing normal.  Neck: Supple, thyroid not palpable. No bruits, nodes or JVD. Respiratory: Respiratory effort normal.  BS equal and clear bilateral without rales, rhonci, wheezing or stridor. Cardio: Heart sounds are normal with regular rate and rhythm and no murmurs, rubs or gallops. Peripheral pulses are normal and equal bilaterally without edema. No aortic or femoral bruits. Chest: symmetric with normal  excursions and percussion. Breasts: Symmetric, without lumps, nipple discharge, retractions, or fibrocystic changes.  Abdomen: Flat, soft with bowel sounds active. Nontender, no guarding, rebound, hernias, masses, or organomegaly.  Lymphatics: Non tender without lymphadenopathy.  Genitourinary:  Musculoskeletal: Full ROM all peripheral extremities, joint stability, 5/5 strength, and normal gait. Skin: Warm and dry without rashes, lesions, cyanosis, clubbing or  ecchymosis.  Neuro: Cranial nerves intact, reflexes equal bilaterally. Normal muscle tone, no cerebellar symptoms. Sensation intact to touch, vibratory and Monofilament to the toes bilaterally. Pysch: Alert and oriented X 3, normal affect, Insight and Judgment appropriate.   Assessment and Plan  1. Annual Preventative Screening Examination  2. Labile hypertension  - EKG 12-Lead -  [place], RETROPERITNL ABD,  LTD - Urinalysis, Routine w reflex microscopic - Microalbumin / creatinine urine ratio - CBC with Differential/Platelet - COMPLETE METABOLIC PANEL WITH GFR - Magnesium - TSH  3. Hyperlipidemia, mixed  - EKG 12-Lead - [place], RETROPERITNL ABD,  LTD - Lipid panel - TSH  4. Type 2 diabetes mellitus with diabetic neuropathy, without long-term current use of insulin (H[facility])  - EKG 12-Lead - [place], RETROPERITNL ABD,  LTD - Urinalysis, Routine w reflex microscopic - Microalbumin / creatinine urine ratio - HM DIABETES FOOT EXAM - LOW EXTREMITY NEUR EXAM DOCUM - Hemoglobin A1c - Insulin, random  5. Vitamin D deficiency  - VITAMIN D 25 Hydroxyl  6. Hypothyroidism  - TSH  7. Class 1 obesity due to excess calories with serious comorbidity and body mass index (BMI) of 31.0 to 31.9 in adult  - phentermine (ADIPEX-P) 37.5 MG tablet; Take 1/2 to 1 tablet every morning for Dieting & Weight  Loss  Dispense: 30 tablet; Refill: 5  - topiramate (TOPAMAX) 25 MG tablet; Take 1 to 2  Tablet(s)  2 x /day at Suppertime & Bedtime for Dieting  & Weight Loss  Dispense: 180 tablet; Refill: 1  8. Gastroesophageal reflux disease  - CBC with Differential/Platelet  9. Screening-pulmonary TB  - TB Skin Test  10. Screening for ischemic heart disease  - EKG 12-Lead - Lipid panel  11. FHx: heart disease  - EKG 12-Lead - [place], RETROPERITNL ABD,  LTD  12. Former smoker  - EKG 12-Lead - [place], RETROPERITNL ABD,  LTD  13. Screening for AAA (aortic abdominal aneurysm)  - [place], RETROPERITNL ABD,  LTD  14. Fatigue  - Iron,Total/Total Iron Binding Cap - Vitamin B12 - CBC with Differential/Platelet  15. Medication management  - Urinalysis, Routine w reflex microscopic - Microalbumin / creatinine urine ratio - Uric acid - CBC with Differential/Platelet - COMPLETE METABOLIC PANEL WITH GFR - Magnesium - Lipid panel - TSH - Hemoglobin A1c - Insulin, random - VITAMIN D 25 Hydroxyl  16. Screening for colorectal cancer  - POC Hemoccult Bld/Stl   17. Mastalgia  - MM Digital Diagnostic Bilat  18. Need for prophylactic vaccination against Streptococcus pneumoniae (pneumococcus)  - Pneumococcal polysaccharide vaccine 23-valent greater than or equal to 2yo subcutaneous/IM        Patient was counseled in prudent diet to achieve/maintain BMI less than 25 for weight control, BP monitoring, regular exercise and medications. Discussed med's effects and SE's. Screening labs and tests as requested with regular follow-up as recommended. Over 40 minutes of exam, counseling, chart review and high complex critical decision making was performed.

## 2018-08-19 NOTE — Patient Instructions (Signed)

## 2018-08-20 ENCOUNTER — Ambulatory Visit: Payer: Managed Care, Other (non HMO) | Admitting: Internal Medicine

## 2018-08-20 VITALS — BP 100/62 | HR 76 | Temp 97.7°F | Resp 16 | Ht 62.25 in | Wt 170.8 lb

## 2018-08-20 DIAGNOSIS — Z0001 Encounter for general adult medical examination with abnormal findings: Secondary | ICD-10-CM

## 2018-08-20 DIAGNOSIS — R5383 Other fatigue: Secondary | ICD-10-CM

## 2018-08-20 DIAGNOSIS — Z1389 Encounter for screening for other disorder: Secondary | ICD-10-CM | POA: Diagnosis not present

## 2018-08-20 DIAGNOSIS — Z131 Encounter for screening for diabetes mellitus: Secondary | ICD-10-CM | POA: Diagnosis not present

## 2018-08-20 DIAGNOSIS — K219 Gastro-esophageal reflux disease without esophagitis: Secondary | ICD-10-CM

## 2018-08-20 DIAGNOSIS — Z0000 Encounter for general adult medical examination without abnormal findings: Secondary | ICD-10-CM | POA: Diagnosis not present

## 2018-08-20 DIAGNOSIS — Z130 Encounter for screening for diseases of the blood and blood-forming organs and certain disorders involving the immune mechanism: Secondary | ICD-10-CM | POA: Diagnosis not present

## 2018-08-20 DIAGNOSIS — E559 Vitamin D deficiency, unspecified: Secondary | ICD-10-CM | POA: Diagnosis not present

## 2018-08-20 DIAGNOSIS — Z111 Encounter for screening for respiratory tuberculosis: Secondary | ICD-10-CM

## 2018-08-20 DIAGNOSIS — Z1211 Encounter for screening for malignant neoplasm of colon: Secondary | ICD-10-CM

## 2018-08-20 DIAGNOSIS — Z23 Encounter for immunization: Secondary | ICD-10-CM

## 2018-08-20 DIAGNOSIS — Z79899 Other long term (current) drug therapy: Secondary | ICD-10-CM

## 2018-08-20 DIAGNOSIS — Z1329 Encounter for screening for other suspected endocrine disorder: Secondary | ICD-10-CM | POA: Diagnosis not present

## 2018-08-20 DIAGNOSIS — E782 Mixed hyperlipidemia: Secondary | ICD-10-CM

## 2018-08-20 DIAGNOSIS — I10 Essential (primary) hypertension: Secondary | ICD-10-CM | POA: Diagnosis not present

## 2018-08-20 DIAGNOSIS — Z136 Encounter for screening for cardiovascular disorders: Secondary | ICD-10-CM | POA: Diagnosis not present

## 2018-08-20 DIAGNOSIS — E1140 Type 2 diabetes mellitus with diabetic neuropathy, unspecified: Secondary | ICD-10-CM

## 2018-08-20 DIAGNOSIS — Z13220 Encounter for screening for lipoid disorders: Secondary | ICD-10-CM | POA: Diagnosis not present

## 2018-08-20 DIAGNOSIS — Z87891 Personal history of nicotine dependence: Secondary | ICD-10-CM

## 2018-08-20 DIAGNOSIS — Z6831 Body mass index (BMI) 31.0-31.9, adult: Secondary | ICD-10-CM

## 2018-08-20 DIAGNOSIS — Z8249 Family history of ischemic heart disease and other diseases of the circulatory system: Secondary | ICD-10-CM

## 2018-08-20 DIAGNOSIS — E039 Hypothyroidism, unspecified: Secondary | ICD-10-CM

## 2018-08-20 DIAGNOSIS — E6609 Other obesity due to excess calories: Secondary | ICD-10-CM

## 2018-08-20 DIAGNOSIS — R0989 Other specified symptoms and signs involving the circulatory and respiratory systems: Secondary | ICD-10-CM

## 2018-08-20 DIAGNOSIS — N644 Mastodynia: Secondary | ICD-10-CM

## 2018-08-20 DIAGNOSIS — Z1212 Encounter for screening for malignant neoplasm of rectum: Secondary | ICD-10-CM

## 2018-08-21 DIAGNOSIS — R5383 Other fatigue: Secondary | ICD-10-CM | POA: Insufficient documentation

## 2018-08-21 DIAGNOSIS — Z136 Encounter for screening for cardiovascular disorders: Secondary | ICD-10-CM | POA: Insufficient documentation

## 2018-08-21 DIAGNOSIS — Z6831 Body mass index (BMI) 31.0-31.9, adult: Secondary | ICD-10-CM

## 2018-08-21 DIAGNOSIS — R0989 Other specified symptoms and signs involving the circulatory and respiratory systems: Secondary | ICD-10-CM | POA: Insufficient documentation

## 2018-08-21 DIAGNOSIS — E6609 Other obesity due to excess calories: Secondary | ICD-10-CM | POA: Insufficient documentation

## 2018-08-21 DIAGNOSIS — Z8249 Family history of ischemic heart disease and other diseases of the circulatory system: Secondary | ICD-10-CM | POA: Insufficient documentation

## 2018-08-21 DIAGNOSIS — Z87891 Personal history of nicotine dependence: Secondary | ICD-10-CM | POA: Insufficient documentation

## 2018-08-21 DIAGNOSIS — I10 Essential (primary) hypertension: Secondary | ICD-10-CM | POA: Insufficient documentation

## 2018-08-21 DIAGNOSIS — E782 Mixed hyperlipidemia: Secondary | ICD-10-CM | POA: Insufficient documentation

## 2018-08-21 DIAGNOSIS — Z111 Encounter for screening for respiratory tuberculosis: Secondary | ICD-10-CM | POA: Insufficient documentation

## 2018-08-21 DIAGNOSIS — Z23 Encounter for immunization: Secondary | ICD-10-CM | POA: Insufficient documentation

## 2018-08-21 LAB — HEMOGLOBIN A1C
Hgb A1c MFr Bld: 6.7 % of total Hgb — ABNORMAL HIGH (ref <5.7)
Mean Plasma Glucose: 146 (calc)
eAG (mmol/L): 8.1 (calc)

## 2018-08-21 LAB — URINALYSIS, ROUTINE W REFLEX MICROSCOPIC
Bilirubin Urine: NEGATIVE
Hgb urine dipstick: NEGATIVE
Ketones, ur: NEGATIVE
Leukocytes, UA: NEGATIVE
Nitrite: NEGATIVE
Protein, ur: NEGATIVE
Specific Gravity, Urine: 1.04 — ABNORMAL HIGH (ref 1.001–1.03)
pH: 6 (ref 5.0–8.0)

## 2018-08-21 LAB — COMPLETE METABOLIC PANEL WITH GFR
AG Ratio: 2 (calc) (ref 1.0–2.5)
ALT: 28 U/L (ref 6–29)
AST: 25 U/L (ref 10–35)
Albumin: 4.6 g/dL (ref 3.6–5.1)
Alkaline phosphatase (APISO): 85 U/L (ref 33–130)
BUN: 22 mg/dL (ref 7–25)
CO2: 30 mmol/L (ref 20–32)
Calcium: 10.1 mg/dL (ref 8.6–10.4)
Chloride: 100 mmol/L (ref 98–110)
Creat: 0.74 mg/dL (ref 0.50–1.05)
GFR, Est African American: 109 mL/min/{1.73_m2} (ref >=60)
GFR, Est Non African American: 94 mL/min/{1.73_m2} (ref >=60)
Globulin: 2.3 g/dL (calc) (ref 1.9–3.7)
Glucose, Bld: 100 mg/dL — ABNORMAL HIGH (ref 65–99)
Potassium: 4.3 mmol/L (ref 3.5–5.3)
Sodium: 139 mmol/L (ref 135–146)
Total Bilirubin: 0.3 mg/dL (ref 0.2–1.2)
Total Protein: 6.9 g/dL (ref 6.1–8.1)

## 2018-08-21 LAB — LIPID PANEL
Cholesterol: 103 mg/dL (ref <200)
HDL: 56 mg/dL (ref >50)
LDL Cholesterol (Calc): 20 mg/dL (calc)
Non-HDL Cholesterol (Calc): 47 mg/dL (calc) (ref <130)
Total CHOL/HDL Ratio: 1.8 (calc) (ref <5.0)
Triglycerides: 195 mg/dL — ABNORMAL HIGH (ref <150)

## 2018-08-21 LAB — CBC WITH DIFFERENTIAL/PLATELET
Absolute Monocytes: 746 cells/uL (ref 200–950)
Basophils Absolute: 73 cells/uL (ref 0–200)
Basophils Relative: 0.8 %
Eosinophils Absolute: 82 cells/uL (ref 15–500)
Eosinophils Relative: 0.9 %
HCT: 42.6 % (ref 35.0–45.0)
Hemoglobin: 14.5 g/dL (ref 11.7–15.5)
Lymphs Abs: 2111 cells/uL (ref 850–3900)
MCH: 30.7 pg (ref 27.0–33.0)
MCHC: 34 g/dL (ref 32.0–36.0)
MCV: 90.1 fL (ref 80.0–100.0)
MPV: 10.6 fL (ref 7.5–12.5)
Monocytes Relative: 8.2 %
Neutro Abs: 6088 cells/uL (ref 1500–7800)
Neutrophils Relative %: 66.9 %
Platelets: 396 10*3/uL (ref 140–400)
RBC: 4.73 10*6/uL (ref 3.80–5.10)
RDW: 12.6 % (ref 11.0–15.0)
Total Lymphocyte: 23.2 %
WBC: 9.1 10*3/uL (ref 3.8–10.8)

## 2018-08-21 LAB — URIC ACID: Uric Acid, Serum: 3.4 mg/dL (ref 2.5–7.0)

## 2018-08-21 LAB — IRON, TOTAL/TOTAL IRON BINDING CAP
%SAT: 18 % (calc) (ref 16–45)
Iron: 75 ug/dL (ref 45–160)
TIBC: 416 mcg/dL (calc) (ref 250–450)

## 2018-08-21 LAB — TSH: TSH: 0.51 mIU/L

## 2018-08-21 LAB — MICROALBUMIN / CREATININE URINE RATIO
Creatinine, Urine: 75 mg/dL (ref 20–275)
Microalb Creat Ratio: 3 mcg/mg creat (ref <30)
Microalb, Ur: 0.2 mg/dL

## 2018-08-21 LAB — MAGNESIUM: Magnesium: 1.9 mg/dL (ref 1.5–2.5)

## 2018-08-21 LAB — VITAMIN B12: Vitamin B-12: 214 pg/mL (ref 200–1100)

## 2018-08-21 LAB — INSULIN, RANDOM: Insulin: 15.8 u[IU]/mL (ref 2.0–19.6)

## 2018-08-21 LAB — VITAMIN D 25 HYDROXY (VIT D DEFICIENCY, FRACTURES): Vit D, 25-Hydroxy: 48 ng/mL (ref 30–100)

## 2018-08-21 MED ORDER — PHENTERMINE HCL 37.5 MG PO TABS: ORAL_TABLET | ORAL | 5 refills | Status: DC

## 2018-08-21 MED ORDER — TOPIRAMATE 25 MG PO TABS: ORAL_TABLET | ORAL | 1 refills | Status: DC

## 2018-08-23 ENCOUNTER — Encounter: Payer: Self-pay | Admitting: Internal Medicine

## 2018-09-01 ENCOUNTER — Other Ambulatory Visit: Payer: Self-pay | Admitting: Internal Medicine

## 2018-09-01 DIAGNOSIS — N644 Mastodynia: Secondary | ICD-10-CM

## 2018-09-17 ENCOUNTER — Other Ambulatory Visit: Payer: Self-pay | Admitting: Adult Health

## 2018-09-24 ENCOUNTER — Other Ambulatory Visit: Payer: Self-pay | Admitting: Internal Medicine

## 2018-09-24 DIAGNOSIS — E1140 Type 2 diabetes mellitus with diabetic neuropathy, unspecified: Secondary | ICD-10-CM

## 2018-09-30 ENCOUNTER — Other Ambulatory Visit: Payer: Self-pay | Admitting: Internal Medicine

## 2018-09-30 ENCOUNTER — Other Ambulatory Visit: Payer: Self-pay | Admitting: Adult Health

## 2018-09-30 MED ORDER — VALACYCLOVIR HCL 1 G PO TABS: ORAL_TABLET | ORAL | 99 refills | Status: DC

## 2018-10-19 ENCOUNTER — Other Ambulatory Visit: Payer: Self-pay | Admitting: Internal Medicine

## 2018-10-19 ENCOUNTER — Other Ambulatory Visit: Payer: Self-pay | Admitting: Adult Health

## 2018-10-19 DIAGNOSIS — F17210 Nicotine dependence, cigarettes, uncomplicated: Secondary | ICD-10-CM

## 2018-10-19 DIAGNOSIS — G4700 Insomnia, unspecified: Secondary | ICD-10-CM

## 2018-10-19 MED ORDER — TRAZODONE HCL 150 MG PO TABS: ORAL_TABLET | ORAL | 1 refills | Status: DC

## 2018-10-22 ENCOUNTER — Ambulatory Visit: Payer: Self-pay | Admitting: Adult Health

## 2018-10-29 ENCOUNTER — Other Ambulatory Visit: Payer: Self-pay | Admitting: Internal Medicine

## 2018-11-05 ENCOUNTER — Other Ambulatory Visit: Payer: Self-pay | Admitting: Adult Health

## 2018-11-20 ENCOUNTER — Other Ambulatory Visit: Payer: Self-pay | Admitting: Adult Health

## 2018-11-20 DIAGNOSIS — E039 Hypothyroidism, unspecified: Secondary | ICD-10-CM

## 2018-11-30 ENCOUNTER — Ambulatory Visit: Payer: Self-pay | Admitting: Adult Health

## 2018-12-03 ENCOUNTER — Other Ambulatory Visit: Payer: Self-pay | Admitting: Adult Health

## 2018-12-26 ENCOUNTER — Other Ambulatory Visit: Payer: Self-pay | Admitting: Internal Medicine

## 2018-12-26 DIAGNOSIS — K219 Gastro-esophageal reflux disease without esophagitis: Secondary | ICD-10-CM

## 2019-01-09 ENCOUNTER — Other Ambulatory Visit: Payer: Self-pay | Admitting: Internal Medicine

## 2019-01-09 DIAGNOSIS — R5383 Other fatigue: Secondary | ICD-10-CM

## 2019-01-09 DIAGNOSIS — N951 Menopausal and female climacteric states: Secondary | ICD-10-CM

## 2019-01-09 DIAGNOSIS — Z780 Asymptomatic menopausal state: Secondary | ICD-10-CM

## 2019-01-09 DIAGNOSIS — G609 Hereditary and idiopathic neuropathy, unspecified: Secondary | ICD-10-CM

## 2019-01-13 ENCOUNTER — Other Ambulatory Visit: Payer: Self-pay | Admitting: Internal Medicine

## 2019-01-13 DIAGNOSIS — E1140 Type 2 diabetes mellitus with diabetic neuropathy, unspecified: Secondary | ICD-10-CM

## 2019-01-14 ENCOUNTER — Other Ambulatory Visit: Payer: Self-pay | Admitting: Adult Health

## 2019-01-14 DIAGNOSIS — Z9109 Other allergy status, other than to drugs and biological substances: Secondary | ICD-10-CM

## 2019-02-16 ENCOUNTER — Other Ambulatory Visit: Payer: Self-pay | Admitting: Cardiology

## 2019-02-16 ENCOUNTER — Other Ambulatory Visit: Payer: Self-pay | Admitting: Internal Medicine

## 2019-02-16 DIAGNOSIS — E6609 Other obesity due to excess calories: Secondary | ICD-10-CM

## 2019-03-01 ENCOUNTER — Ambulatory Visit: Payer: Self-pay | Admitting: Physician Assistant

## 2019-03-25 ENCOUNTER — Other Ambulatory Visit: Payer: Self-pay | Admitting: Internal Medicine

## 2019-03-25 DIAGNOSIS — E1140 Type 2 diabetes mellitus with diabetic neuropathy, unspecified: Secondary | ICD-10-CM

## 2019-04-09 ENCOUNTER — Other Ambulatory Visit: Payer: Self-pay | Admitting: Internal Medicine

## 2019-04-09 DIAGNOSIS — G4700 Insomnia, unspecified: Secondary | ICD-10-CM

## 2019-05-11 ENCOUNTER — Other Ambulatory Visit: Payer: Self-pay

## 2019-05-11 ENCOUNTER — Ambulatory Visit: Payer: Managed Care, Other (non HMO) | Admitting: Physician Assistant

## 2019-05-11 ENCOUNTER — Encounter: Payer: Self-pay | Admitting: Physician Assistant

## 2019-05-11 VITALS — BP 116/86 | HR 85 | Temp 98.6°F | Wt 177.6 lb

## 2019-05-11 DIAGNOSIS — H663X2 Other chronic suppurative otitis media, left ear: Secondary | ICD-10-CM | POA: Diagnosis not present

## 2019-05-11 MED ORDER — FLUCONAZOLE 150 MG PO TABS: 150.0000 mg | ORAL_TABLET | Freq: Every day | ORAL | 3 refills | Status: DC

## 2019-05-11 MED ORDER — DEXAMETHASONE 0.5 MG PO TABS: ORAL_TABLET | ORAL | 0 refills | Status: DC

## 2019-05-11 MED ORDER — DOXYCYCLINE HYCLATE 100 MG PO CAPS: ORAL_CAPSULE | ORAL | 0 refills | Status: DC

## 2019-05-11 MED ORDER — LEVOFLOXACIN 500 MG PO TABS: 500.0000 mg | ORAL_TABLET | Freq: Every day | ORAL | 0 refills | Status: DC

## 2019-05-11 NOTE — Patient Instructions (Signed)

## 2019-05-11 NOTE — Progress Notes (Signed)
Subjective:    Patient ID: Caitlin Barnett, female    DOB: 07-Jan-1966, 53 y.o.   MRN: [id number]  HPI 53 y.o. former smoking female presents with left ear pain. She states she has had ear issues x years. Has had chills, vomiting, nausea, left ear pain with drainage.   No cough, wheezing, SOB.  No covid exposure.   Blood pressure 116/86, pulse 85, temperature 98.6 F (37 C), weight 177 lb 9.6 oz (80.6 kg), SpO2 97 %.  Medications Current Outpatient Medications on File Prior to Visit  Medication Sig  . albuterol (PROVENTIL HFA;VENTOLIN HFA) 108 (90 Base) MCG/ACT inhaler 2 inhalations  15-20  minutes apart every 4 hours as needed to rescue Asthma  . albuterol (PROVENTIL) (2.5 MG/3ML) 0.083% nebulizer solution Take 3 mLs (2.5 mg total) by nebulization every 6 (six) hours as needed for wheezing or shortness of breath.  Marland Kitchen aspirin EC 81 MG tablet Take 81 mg by mouth daily.  . blood glucose meter kit and supplies KIT Dispense based on patient and insurance preference. Use up to four times daily as directed. (FOR ICD-9 250.00, 250.01).  . butalbital-acetaminophen-caffeine (FIORICET, ESGIC) 50-325-40 MG tablet TAKE 1/2 TO 1 TABLET EVERY 4 HOURS IF NEEDED FOR HEADACHE  . cetirizine (ZYRTEC) 10 MG tablet Take 10 mg by mouth daily.  . Cholecalciferol 1000 units capsule Take 5,000 Units by mouth 2 (two) times daily.   . Cinnamon 500 MG TABS Take 2 capsules by mouth 2 (two) times daily.  . cyanocobalamin (,VITAMIN B-12,) 1000 MCG/ML injection Inject 1 mL (1,000 mcg total) into the muscle every 30 (thirty) days.  Marland Kitchen dexamethasone (DECADRON) 1 MG tablet Take 1 tab 3 x day - 3 days, then 2 x day - 3 days, then 1 tab daily  . empagliflozin (JARDIANCE) 25 MG TABS tablet Take 1 tablet Daily for Diabetes  . estrogen, conjugated,-medroxyprogesterone (PREMPRO) 0.45-1.5 MG tablet TAKE 1 TABLET BY MOUTH EVERY DAY  . fenofibrate (TRICOR) 145 MG tablet TAKE 1 TABLET BY MOUTH EVERY DAY  . fluticasone (FLONASE) 50  MCG/ACT nasal spray Place 1 spray into both nostrils daily.  . furosemide (LASIX) 40 MG tablet TAKE 40 MG (1 TAB) BY MOUTH DAILY. MAY TAKE 1/2 TAB ON MOST DAYS AND 1 FULL TAB DAILY WHEN NEEDED.  Marland Kitchen gabapentin (NEURONTIN) 600 MG tablet TAKE 1/2 TO 1 TABLET 3 TO 4 X /DAY FOR DIABETIC NEUROPATHY  . ibuprofen (ADVIL,MOTRIN) 200 MG tablet Take 800 mg by mouth as needed for moderate pain.  Marland Kitchen levothyroxine (SYNTHROID, LEVOTHROID) 125 MCG tablet TAKE 1 TABLET EVERY MORNING ON AN EMPTY STOMACH WITH ONLY WATER FOR 30 MINUTES  . Magnesium 250 MG TABS Take 250 mg by mouth daily.  . metFORMIN (GLUCOPHAGE-XR) 500 MG 24 hr tablet TAKE 2 TABLETS 2 X / DAY FOR DIABETES  . montelukast (SINGULAIR) 10 MG tablet TAKE 1 TABLET DAILY FOR ALLERGIES  . omeprazole (PRILOSEC) 40 MG capsule TAKE 1 CAPSULE DAILY FOR ACID INDIGESTION  . potassium chloride (K-DUR) 10 MEQ tablet Take 1 tablet Daily  . pravastatin (PRAVACHOL) 20 MG tablet TAKE 1 TABLET BY MOUTH EVERY DAY  . pregabalin (LYRICA) 75 MG capsule Take 1 capsule 3 x /day for Neuropathy Pains  . traZODone (DESYREL) 150 MG tablet TAKE 1/2 TO 1 TABLET 1 HOUR BEFORE BEDTIME  . valACYclovir (VALTREX) 1000 MG tablet Take 1 tablet 3 x day for Fever Blisters / Cold Sores   No current facility-administered medications on file prior to visit.  Problem list She has Hypothyroidism; Type 2 diabetes mellitus ([place]); Vitamin D deficiency; Postmenopausal; Hot flashes due to menopause; Vitamin B12 deficiency; Peripheral neuropathy; Hyperlipidemia associated with type 2 diabetes mellitus ([place]); GERD (gastroesophageal reflux disease); Obesity (BMI 30.0-34.9); Mastalgia; Bilateral lower extremity edema; Claudication in peripheral vascular disease ([place]); Acute diastolic (congestive) heart failure ([place]); Hepatic steatosis; Otitis externa; Chronic suppurative otitis media of left ear; Need for prophylactic vaccination against Streptococcus pneumoniae (pneumococcus); Screening for ischemic  heart disease; Fatigue; Former smoker; FHx: heart disease; Screening-pulmonary TB; Class 1 obesity due to excess calories with serious comorbidity and body mass index (BMI) of 31.0 to 31.9 in adult; Hyperlipidemia, mixed; and Labile hypertension on their problem list.   Review of Systems     Objective:   Physical Exam Constitutional:      Appearance: She is well-developed.  HENT:     Head: Normocephalic and atraumatic.     Jaw: There is normal jaw occlusion. No trismus.     Right Ear: Hearing, tympanic membrane and external ear normal. No drainage, swelling or tenderness. No mastoid tenderness. Tympanic membrane is not injected, scarred, perforated, erythematous or retracted.     Left Ear: Hearing and external ear normal. Swelling and tenderness present. Tympanic membrane is injected, erythematous and bulging.  Eyes:     Conjunctiva/sclera: Conjunctivae normal.     Pupils: Pupils are equal, round, and reactive to light.  Neck:     Musculoskeletal: Normal range of motion and neck supple.     Thyroid: No thyromegaly.  Cardiovascular:     Rate and Rhythm: Normal rate and regular rhythm.     Heart sounds: Normal heart sounds. No murmur. No friction rub. No gallop.   Pulmonary:     Effort: Pulmonary effort is normal. No respiratory distress.     Breath sounds: Normal breath sounds. No wheezing.  Abdominal:     General: Bowel sounds are normal. There is no distension.     Palpations: Abdomen is soft. There is no mass.     Tenderness: There is no abdominal tenderness. There is no guarding or rebound.  Musculoskeletal: Normal range of motion.  Lymphadenopathy:     Cervical: No cervical adenopathy.  Skin:    General: Skin is warm and dry.  Neurological:     Mental Status: She is alert and oriented to person, place, and time.     Cranial Nerves: No cranial nerve deficit.     Coordination: Coordination normal.     Deep Tendon Reflexes: Reflexes normal.           Assessment &  Plan:    Chronic suppurative otitis media of left ear, unspecified otitis media location Recurrent ear infection, will treat encouarged to follow up with ENT -     doxycycline (VIBRAMYCIN) 100 MG capsule; Take 1 capsule twice daily with food -     levofloxacin (LEVAQUIN) 500 MG tablet; Take 1 tablet (500 mg total) by mouth daily. -     dexamethasone (DECADRON) 0.5 MG tablet; 1 tablet PO BID for 3 days, then take 1 tablet PO for 4 days. -     fluconazole (DIFLUCAN) 150 MG tablet; Take 1 tablet (150 mg total) by mouth daily.     Future Appointments  Date Time Provider [place]  10/06/2019  3:00 PM Unk Pinto, MD [id number] None

## 2019-05-20 ENCOUNTER — Other Ambulatory Visit: Payer: Self-pay | Admitting: Adult Health

## 2019-05-20 ENCOUNTER — Other Ambulatory Visit: Payer: Self-pay | Admitting: Internal Medicine

## 2019-05-20 DIAGNOSIS — E039 Hypothyroidism, unspecified: Secondary | ICD-10-CM

## 2019-05-29 ENCOUNTER — Other Ambulatory Visit: Payer: Self-pay | Admitting: Adult Health

## 2019-06-06 ENCOUNTER — Other Ambulatory Visit: Payer: Self-pay | Admitting: Internal Medicine

## 2019-06-06 DIAGNOSIS — E6609 Other obesity due to excess calories: Secondary | ICD-10-CM

## 2019-06-06 DIAGNOSIS — Z6831 Body mass index (BMI) 31.0-31.9, adult: Secondary | ICD-10-CM

## 2019-07-15 ENCOUNTER — Other Ambulatory Visit: Payer: Self-pay | Admitting: Internal Medicine

## 2019-07-15 MED ORDER — DEXAMETHASONE 4 MG PO TABS: ORAL_TABLET | ORAL | 0 refills | Status: DC

## 2019-07-22 ENCOUNTER — Other Ambulatory Visit: Payer: Self-pay | Admitting: Internal Medicine

## 2019-07-22 MED ORDER — AZITHROMYCIN 250 MG PO TABS: ORAL_TABLET | ORAL | 1 refills | Status: DC

## 2019-07-22 MED ORDER — DEXAMETHASONE 4 MG PO TABS: ORAL_TABLET | ORAL | 0 refills | Status: DC

## 2019-07-25 ENCOUNTER — Other Ambulatory Visit: Payer: Self-pay

## 2019-07-25 ENCOUNTER — Emergency Department (HOSPITAL_COMMUNITY): Payer: Managed Care, Other (non HMO)

## 2019-07-25 ENCOUNTER — Inpatient Hospital Stay (HOSPITAL_COMMUNITY)
Admission: EM | Admit: 2019-07-25 | Discharge: 2019-08-01 | DRG: 177 | Disposition: A | Discharge status: Home / Self Care | Payer: Managed Care, Other (non HMO) | Attending: Family Medicine | Admitting: Family Medicine

## 2019-07-25 DIAGNOSIS — E119 Type 2 diabetes mellitus without complications: Secondary | ICD-10-CM

## 2019-07-25 DIAGNOSIS — Z818 Family history of other mental and behavioral disorders: Secondary | ICD-10-CM

## 2019-07-25 DIAGNOSIS — Z79890 Hormone replacement therapy (postmenopausal): Secondary | ICD-10-CM | POA: Diagnosis not present

## 2019-07-25 DIAGNOSIS — Z882 Allergy status to sulfonamides status: Secondary | ICD-10-CM

## 2019-07-25 DIAGNOSIS — Z7982 Long term (current) use of aspirin: Secondary | ICD-10-CM | POA: Diagnosis not present

## 2019-07-25 DIAGNOSIS — J9600 Acute respiratory failure, unspecified whether with hypoxia or hypercapnia: Secondary | ICD-10-CM | POA: Diagnosis not present

## 2019-07-25 DIAGNOSIS — J189 Pneumonia, unspecified organism: Secondary | ICD-10-CM

## 2019-07-25 DIAGNOSIS — Z7984 Long term (current) use of oral hypoglycemic drugs: Secondary | ICD-10-CM

## 2019-07-25 DIAGNOSIS — Z811 Family history of alcohol abuse and dependence: Secondary | ICD-10-CM | POA: Diagnosis not present

## 2019-07-25 DIAGNOSIS — Z8249 Family history of ischemic heart disease and other diseases of the circulatory system: Secondary | ICD-10-CM

## 2019-07-25 DIAGNOSIS — U071 COVID-19: Secondary | ICD-10-CM

## 2019-07-25 DIAGNOSIS — I10 Essential (primary) hypertension: Secondary | ICD-10-CM | POA: Diagnosis present

## 2019-07-25 DIAGNOSIS — Z823 Family history of stroke: Secondary | ICD-10-CM

## 2019-07-25 DIAGNOSIS — E1165 Type 2 diabetes mellitus with hyperglycemia: Secondary | ICD-10-CM | POA: Diagnosis not present

## 2019-07-25 DIAGNOSIS — J45909 Unspecified asthma, uncomplicated: Secondary | ICD-10-CM | POA: Diagnosis present

## 2019-07-25 DIAGNOSIS — E781 Pure hyperglyceridemia: Secondary | ICD-10-CM | POA: Diagnosis present

## 2019-07-25 DIAGNOSIS — J1289 Other viral pneumonia: Secondary | ICD-10-CM | POA: Diagnosis present

## 2019-07-25 DIAGNOSIS — Z841 Family history of disorders of kidney and ureter: Secondary | ICD-10-CM

## 2019-07-25 DIAGNOSIS — Z803 Family history of malignant neoplasm of breast: Secondary | ICD-10-CM

## 2019-07-25 DIAGNOSIS — Z887 Allergy status to serum and vaccine status: Secondary | ICD-10-CM | POA: Diagnosis not present

## 2019-07-25 DIAGNOSIS — Z79899 Other long term (current) drug therapy: Secondary | ICD-10-CM

## 2019-07-25 DIAGNOSIS — E039 Hypothyroidism, unspecified: Secondary | ICD-10-CM | POA: Diagnosis present

## 2019-07-25 DIAGNOSIS — J9601 Acute respiratory failure with hypoxia: Secondary | ICD-10-CM | POA: Diagnosis present

## 2019-07-25 DIAGNOSIS — E782 Mixed hyperlipidemia: Secondary | ICD-10-CM | POA: Diagnosis present

## 2019-07-25 DIAGNOSIS — R0989 Other specified symptoms and signs involving the circulatory and respiratory systems: Secondary | ICD-10-CM | POA: Diagnosis present

## 2019-07-25 DIAGNOSIS — Z87891 Personal history of nicotine dependence: Secondary | ICD-10-CM | POA: Diagnosis not present

## 2019-07-25 DIAGNOSIS — R0902 Hypoxemia: Secondary | ICD-10-CM | POA: Diagnosis present

## 2019-07-25 DIAGNOSIS — Z791 Long term (current) use of non-steroidal anti-inflammatories (NSAID): Secondary | ICD-10-CM | POA: Diagnosis not present

## 2019-07-25 DIAGNOSIS — E860 Dehydration: Secondary | ICD-10-CM | POA: Diagnosis present

## 2019-07-25 DIAGNOSIS — K219 Gastro-esophageal reflux disease without esophagitis: Secondary | ICD-10-CM | POA: Diagnosis present

## 2019-07-25 DIAGNOSIS — E1151 Type 2 diabetes mellitus with diabetic peripheral angiopathy without gangrene: Secondary | ICD-10-CM | POA: Diagnosis present

## 2019-07-25 DIAGNOSIS — I739 Peripheral vascular disease, unspecified: Secondary | ICD-10-CM | POA: Diagnosis present

## 2019-07-25 LAB — TRIGLYCERIDES: Triglycerides: 512 mg/dL — ABNORMAL HIGH (ref <150)

## 2019-07-25 LAB — CBC WITH DIFFERENTIAL/PLATELET
Abs Immature Granulocytes: 0.12 10*3/uL — ABNORMAL HIGH (ref 0.00–0.07)
Basophils Absolute: 0 10*3/uL (ref 0.0–0.1)
Basophils Relative: 0 %
Eosinophils Absolute: 0 10*3/uL (ref 0.0–0.5)
Eosinophils Relative: 0 %
HCT: 46.1 % — ABNORMAL HIGH (ref 36.0–46.0)
Hemoglobin: 15.5 g/dL — ABNORMAL HIGH (ref 12.0–15.0)
Immature Granulocytes: 1 %
Lymphocytes Relative: 12 %
Lymphs Abs: 1.1 10*3/uL (ref 0.7–4.0)
MCH: 31.1 pg (ref 26.0–34.0)
MCHC: 33.6 g/dL (ref 30.0–36.0)
MCV: 92.4 fL (ref 80.0–100.0)
Monocytes Absolute: 0.6 10*3/uL (ref 0.1–1.0)
Monocytes Relative: 6 %
Neutro Abs: 7.4 10*3/uL (ref 1.7–7.7)
Neutrophils Relative %: 81 %
Platelets: 263 10*3/uL (ref 150–400)
RBC: 4.99 MIL/uL (ref 3.87–5.11)
RDW: 12.5 % (ref 11.5–15.5)
WBC: 9.2 10*3/uL (ref 4.0–10.5)
nRBC: 0 % (ref 0.0–0.2)

## 2019-07-25 LAB — COMPREHENSIVE METABOLIC PANEL
ALT: 38 U/L (ref 0–44)
AST: 28 U/L (ref 15–41)
Albumin: 3.6 g/dL (ref 3.5–5.0)
Alkaline Phosphatase: 93 U/L (ref 38–126)
Anion gap: 15 (ref 5–15)
BUN: 21 mg/dL — ABNORMAL HIGH (ref 6–20)
CO2: 23 mmol/L (ref 22–32)
Calcium: 10.4 mg/dL — ABNORMAL HIGH (ref 8.9–10.3)
Chloride: 99 mmol/L (ref 98–111)
Creatinine, Ser: 0.53 mg/dL (ref 0.44–1.00)
GFR calc Af Amer: >60 mL/min (ref >60)
GFR calc non Af Amer: >60 mL/min (ref >60)
Glucose, Bld: 196 mg/dL — ABNORMAL HIGH (ref 70–99)
Potassium: 4.2 mmol/L (ref 3.5–5.1)
Sodium: 137 mmol/L (ref 135–145)
Total Bilirubin: 0.5 mg/dL (ref 0.3–1.2)
Total Protein: 6.9 g/dL (ref 6.5–8.1)

## 2019-07-25 LAB — LACTIC ACID, PLASMA: Lactic Acid, Venous: 3.1 mmol/L (ref 0.5–1.9)

## 2019-07-25 LAB — LACTATE DEHYDROGENASE: LDH: 194 U/L — ABNORMAL HIGH (ref 98–192)

## 2019-07-25 LAB — D-DIMER, QUANTITATIVE: D-Dimer, Quant: 0.31 ug/mL-FEU (ref 0.00–0.50)

## 2019-07-25 LAB — I-STAT BETA HCG BLOOD, ED (MC, WL, AP ONLY): I-stat hCG, quantitative: <5 m[IU]/mL (ref <5)

## 2019-07-25 LAB — PROCALCITONIN: Procalcitonin: <0.1 ng/mL

## 2019-07-25 LAB — C-REACTIVE PROTEIN: CRP: 7.8 mg/dL — ABNORMAL HIGH (ref <1.0)

## 2019-07-25 LAB — FERRITIN: Ferritin: 116 ng/mL (ref 11–307)

## 2019-07-25 LAB — FIBRINOGEN: Fibrinogen: 460 mg/dL (ref 210–475)

## 2019-07-25 MED ORDER — SODIUM CHLORIDE 0.9 % IV SOLN
1.0000 g | Freq: Once | INTRAVENOUS | Status: AC
  Administered 2019-07-25: 1 g via INTRAVENOUS
  Filled 2019-07-25: qty 10

## 2019-07-25 MED ORDER — MORPHINE SULFATE (PF) 4 MG/ML IV SOLN
4.0000 mg | Freq: Once | INTRAVENOUS | Status: AC
  Administered 2019-07-25: 4 mg via INTRAVENOUS
  Filled 2019-07-25: qty 1

## 2019-07-25 MED ORDER — DEXAMETHASONE SODIUM PHOSPHATE 10 MG/ML IJ SOLN
6.0000 mg | Freq: Once | INTRAMUSCULAR | Status: AC
  Administered 2019-07-25: 6 mg via INTRAVENOUS
  Filled 2019-07-25: qty 1

## 2019-07-25 MED ORDER — SODIUM CHLORIDE 0.9 % IV SOLN
500.0000 mg | Freq: Once | INTRAVENOUS | Status: AC
  Administered 2019-07-25: 500 mg via INTRAVENOUS
  Filled 2019-07-25: qty 500

## 2019-07-25 MED ORDER — IOHEXOL 350 MG/ML SOLN
75.0000 mL | Freq: Once | INTRAVENOUS | Status: AC | PRN
  Administered 2019-07-25: 75 mL via INTRAVENOUS

## 2019-07-25 NOTE — ED Provider Notes (Signed)
[facility] EMERGENCY DEPARTMENT Provider Note   CSN: [id number] Arrival date & time: 07/25/19  1811     History Chief Complaint  Patient presents with  . COVID+  . Shortness of Breath    Caitlin Barnett is a 53 y.o. female presents today via EMS for shortness of breath.  Patient Covid positive on outpatient test 14 days ago.  She was asymptomatic for some time but reports that over the past 4-5 days she has developed shortness of breath, fever/chills, fatigue and body aches.  Patient describes shortness of breath as feeling of exertional fatigue as well as difficulty catching her breath.  She reports she has been monitoring her oxygen on a home monitor and reports that it has been decreasing down into the mid 80s with ambulation.  Patient is an Therapist, sports.  Patient reports that since yesterday she has developed "pleurisy" she describes a stabbing sensation in her right lower chest with deep breaths and with movement and palpation of the right lower ribs.  Patient reports that she has taken Tylenol today prior to arrival.  Patient reports that she was exposed to Covid through her husband.  Denies headache/vision changes, neck pain, hemoptysis, abdominal pain, nausea/vomiting, diarrhea, extremity swelling/color change or any additional concerns.  HPI     Past Medical History:  Diagnosis Date  . Diabetes mellitus without complication ([place])   . Diverticulitis   . GERD (gastroesophageal reflux disease)   . Hyperlipidemia   . IBS (irritable bowel syndrome)   . Migraine   . Pneumonia 11/2017  . Thyroid disease   . UTI (urinary tract infection)     Patient Active Problem List   Diagnosis Date Noted  . Need for prophylactic vaccination against Streptococcus pneumoniae (pneumococcus) 08/21/2018  . Screening for ischemic heart disease 08/21/2018  . Fatigue 08/21/2018  . Former smoker 08/21/2018  . FHx: heart disease 08/21/2018  . Screening-pulmonary TB 08/21/2018  . Class  1 obesity due to excess calories with serious comorbidity and body mass index (BMI) of 31.0 to 31.9 in adult 08/21/2018  . Hyperlipidemia, mixed 08/21/2018  . Labile hypertension 08/21/2018  . Otitis externa 08/12/2018  . Chronic suppurative otitis media of left ear 08/12/2018  . Hepatic steatosis 12/09/2017  . Acute diastolic (congestive) heart failure ([place]) 12/01/2017  . Mastalgia 11/07/2017  . Bilateral lower extremity edema 11/07/2017  . Claudication in peripheral vascular disease ([place]) 11/07/2017  . Hyperlipidemia associated with type 2 diabetes mellitus ([place]) 10/20/2017  . GERD (gastroesophageal reflux disease) 10/20/2017  . Obesity (BMI 30.0-34.9) 10/20/2017  . Hypothyroidism 01/16/2017  . Type 2 diabetes mellitus ([place]) 01/16/2017  . Vitamin D deficiency 01/16/2017  . Postmenopausal 01/16/2017  . Hot flashes due to menopause 01/16/2017  . Vitamin B12 deficiency 01/16/2017  . Peripheral neuropathy 01/16/2017    Past Surgical History:  Procedure Laterality Date  . CESAREAN SECTION  1993  . cystoscopies     multiple  . FUNCTIONAL ENDOSCOPIC SINUS SURGERY  2003  . LAPAROSCOPIC OVARIAN CYSTECTOMY  1983  . TONSILLECTOMY  1973  . TYMPANOPLASTY W/ MASTOIDECTOMY Left 2016 & 2017     OB History   No obstetric history on file.     Family History  Problem Relation Age of Onset  . Mental illness Mother        depression  . Suicidality Mother   . Alcohol abuse Father   . Kidney disease Father   . Mental illness Father        depression  .  Suicidality Father   . Breast cancer Maternal Aunt        breast cancer  . Heart disease Maternal Grandmother   . Breast cancer Maternal Grandmother   . Suicidality Maternal Grandfather   . Stroke Paternal Grandmother   . Heart disease Paternal Grandmother   . Heart disease Paternal Grandfather     Social History   Tobacco Use  . Smoking status: Former Smoker    Packs/day: 0.50    Years: 30.00    Pack years: 15.00    Types:  Cigarettes    Quit date: 08/24/2017    Years since quitting: 1.9  . Smokeless tobacco: Never Used  Substance Use Topics  . Alcohol use: No  . Drug use: No    Home Medications Prior to Admission medications   Medication Sig Start Date End Date Taking? Authorizing Provider  blood glucose meter kit and supplies KIT Dispense based on patient and insurance preference. Use up to four times daily as directed. (FOR ICD-9 250.00, 250.01). 12/05/17  Yes Annita Brod, MD  albuterol (PROVENTIL HFA;VENTOLIN HFA) 108 (90 Base) MCG/ACT inhaler 2 inhalations  15-20  minutes apart every 4 hours as needed to rescue Asthma Patient taking differently: Inhale 2 puffs into the lungs every 4 (four) hours as needed (asthma). Inhale 15-20  minutes apart every 4 hours as needed 07/29/18   Unk Pinto, MD  albuterol (PROVENTIL) (2.5 MG/3ML) 0.083% nebulizer solution Take 3 mLs (2.5 mg total) by nebulization every 6 (six) hours as needed for wheezing or shortness of breath. 12/05/17   Annita Brod, MD  aspirin EC 81 MG tablet Take 81 mg by mouth daily.    [provider]  azithromycin (ZITHROMAX) 250 MG tablet Take 2 tablets with Food on  Day 1, then 1 tablet Daily with Food for Infection Patient taking differently: Take 250-500 mg by mouth See admin instructions. 530m with food on day 1, then 2553mtablet daily with food 07/22/19   McUnk PintoMD  butalbital-acetaminophen-caffeine (FIORICET, ESGIC) 50-325-40 MG tablet TAKE 1/2 TO 1 TABLET EVERY 4 HOURS IF NEEDED FOR HEADACHE Patient taking differently: Take 0.5 tablets by mouth every 4 (four) hours as needed for headache.  07/29/18   McUnk PintoMD  cetirizine (ZYRTEC) 10 MG tablet Take 10 mg by mouth daily.    [provider]  Cholecalciferol 1000 units capsule Take 5,000 Units by mouth 2 (two) times daily.     [provider]  Cinnamon 500 MG TABS Take 2 capsules by mouth 2 (two) times daily.    [provider]  cyanocobalamin (,VITAMIN B-12,) 1000 MCG/ML injection INJECT 1 ML (1,000 MCG TOTAL) INTO THE MUSCLE EVERY 30 (THIRTY) DAYS. 05/20/19   McUnk PintoMD  dexamethasone (DECADRON) 4 MG tablet Take 1 tab 3 x day - 3 days, then 2 x day - 3 days, then 1 tab daily Patient taking differently: Take 4 mg by mouth daily. Take 1 tab 3 x day - 3 days, then 2 x day - 3 days, then 1 tab daily 07/22/19   McUnk PintoMD  doxycycline (VIBRAMYCIN) 100 MG capsule Take 1 capsule twice daily with food Patient taking differently: Take 100 mg by mouth 2 (two) times daily. Take with food 05/11/19   CoVicie MuttersPA-C  empagliflozin (JARDIANCE) 25 MG TABS tablet Take 1 tablet Daily for Diabetes Patient taking differently: Take 25 mg by mouth daily.  12/03/18   McUnk PintoMD  estrogen, conjugated,-medroxyprogesterone ([facility]  0.45-1.5 MG tablet TAKE 1 TABLET BY MOUTH EVERY DAY Patient taking differently: Take 1 tablet by mouth daily.  01/09/19   Unk Pinto, MD  fenofibrate (TRICOR) 145 MG tablet Take 1 tablet Daily for Triglycerides (Blood Fats) Patient taking differently: Take 145 mg by mouth daily.  05/29/19   Unk Pinto, MD  fluconazole (DIFLUCAN) 150 MG tablet Take 1 tablet (150 mg total) by mouth daily. 05/11/19   Vicie Mutters, PA-C  fluticasone (FLONASE) 50 MCG/ACT nasal spray Place 1 spray into both nostrils daily. 08/06/17   Liane Comber, NP  furosemide (LASIX) 40 MG tablet TAKE 40 MG (1 TAB) BY MOUTH DAILY. MAY TAKE 1/2 TAB ON MOST DAYS AND 1 FULL TAB DAILY WHEN NEEDED. Patient taking differently: Take 20-40 mg by mouth daily. May take 1/2 tab on most days and 1 full tab daily when needed. 02/16/19   Lelon Perla, MD  gabapentin (NEURONTIN) 600 MG tablet TAKE 1/2 TO 1 TABLET 3 TO 4 X /DAY FOR DIABETIC NEUROPATHY Patient taking differently: Take 300-600 mg by mouth 3 (three) times daily. 3 to 4 times day for Diabetic Neuropathy 03/25/19   Unk Pinto, MD    ibuprofen (ADVIL,MOTRIN) 200 MG tablet Take 800 mg by mouth as needed for moderate pain.    [provider]  levofloxacin (LEVAQUIN) 500 MG tablet Take 1 tablet (500 mg total) by mouth daily. 05/11/19   Vicie Mutters, PA-C  levothyroxine (SYNTHROID) 125 MCG tablet Take 1 tablet daily on an empty stomach with only water for 30 minutes & no Antacid meds, Calcium or Magnesium for 4 hours & avoid Biotin Patient taking differently: Take 125 mcg by mouth daily before breakfast. On an empty stomach with only water for 30 minutes & no Antacid meds, Calcium or Magnesium for 4 hours & avoid Biotin 05/20/19   Unk Pinto, MD  Magnesium 250 MG TABS Take 250 mg by mouth daily.    [provider]  metFORMIN (GLUCOPHAGE-XR) 500 MG 24 hr tablet TAKE 2 TABLETS 2 X / DAY FOR DIABETES Patient taking differently: Take 1,000 mg by mouth 2 (two) times daily.  01/13/19   Liane Comber, NP  montelukast (SINGULAIR) 10 MG tablet TAKE 1 TABLET DAILY FOR ALLERGIES Patient taking differently: Take 10 mg by mouth daily.  01/14/19   Vicie Mutters, PA-C  omeprazole (PRILOSEC) 40 MG capsule TAKE 1 CAPSULE DAILY FOR ACID INDIGESTION Patient taking differently: Take 40 mg by mouth daily.  12/26/18   Unk Pinto, MD  potassium chloride (K-DUR) 10 MEQ tablet Take 1 tablet Daily Patient taking differently: Take 10 mEq by mouth daily.  12/26/18   Unk Pinto, MD  pravastatin (PRAVACHOL) 20 MG tablet Take 1 tablet at Bedtime for Cholesterol 05/20/19   Unk Pinto, MD  pregabalin (LYRICA) 75 MG capsule Take 1 capsule 3 x /day for Neuropathy Pains Patient taking differently: Take 75 mg by mouth 3 (three) times daily. For Neuropathy Pains 01/09/19   Unk Pinto, MD  topiramate (TOPAMAX) 25 MG tablet TAKE 1 TO 2 TABLET(S) 2 X /DAY AT SUPPERTIME & BEDTIME FOR DIETING & WEIGHT LOSS Patient taking differently: Take 25-50 mg by mouth 2 (two) times daily. At [facility] for Dieting & Weight Loss  06/06/19   Unk Pinto, MD  traZODone (DESYREL) 150 MG tablet TAKE 1/2 TO 1 TABLET 1 HOUR BEFORE BEDTIME Patient taking differently: Take 75-150 mg by mouth at bedtime. 1 hour before Bedtime 04/09/19   Liane Comber, NP  valACYclovir (VALTREX) 1000  MG tablet Take 1 tablet 3 x day for Fever Blisters / Cold Sores 09/30/18   Unk Pinto, MD    Allergies    Tetanus toxoids and Sulfa antibiotics  Review of Systems   Review of Systems Ten systems are reviewed and are negative for acute change except as noted in the HPI  Physical Exam Updated Vital Signs BP 114/74   Pulse 99   Temp 98.2 F (36.8 C) (Oral)   Resp 20   Ht _0  (1.575 m)   Wt 76.2 kg   SpO2 91%   BMI 30.73 kg/m   Physical Exam Constitutional:      General: She is not in acute distress.    Appearance: Normal appearance. She is well-developed. She is not ill-appearing or diaphoretic.  HENT:     Head: Normocephalic and atraumatic.     Right Ear: External ear normal.     Left Ear: External ear normal.     Nose: Nose normal.  Eyes:     General: Vision grossly intact. Gaze aligned appropriately.     Pupils: Pupils are equal, round, and reactive to light.  Neck:     Trachea: Trachea and phonation normal. No tracheal deviation.  Cardiovascular:     Rate and Rhythm: Normal rate and regular rhythm.  Pulmonary:     Effort: Pulmonary effort is normal. Tachypnea present. No respiratory distress.     Breath sounds: Decreased breath sounds present.  Chest:     Chest wall: Tenderness present. No deformity or crepitus.  Abdominal:     General: There is no distension.     Palpations: Abdomen is soft.     Tenderness: There is no abdominal tenderness. There is no guarding or rebound.  Musculoskeletal:        General: Normal range of motion.     Cervical back: Normal range of motion.     Right lower leg: No tenderness. No edema.     Left lower leg: No tenderness. No edema.  Skin:    General: Skin is warm and dry.    Neurological:     Mental Status: She is alert.     GCS: GCS eye subscore is 4. GCS verbal subscore is 5. GCS motor subscore is 6.     Comments: Speech is clear and goal oriented, follows commands Major Cranial nerves without deficit, no facial droop Moves extremities without ataxia, coordination intact  Psychiatric:        Behavior: Behavior normal.     ED Results / Procedures / Treatments   Labs (all labs ordered are listed, but only abnormal results are displayed) Labs Reviewed  LACTIC ACID, PLASMA - Abnormal; Notable for the following components:      Result Value   Lactic Acid, Venous 3.1 (*)    All other components within normal limits  CBC WITH DIFFERENTIAL/PLATELET - Abnormal; Notable for the following components:   Hemoglobin 15.5 (*)    HCT 46.1 (*)    Abs Immature Granulocytes 0.12 (*)    All other components within normal limits  COMPREHENSIVE METABOLIC PANEL - Abnormal; Notable for the following components:   Glucose, Bld 196 (*)    BUN 21 (*)    Calcium 10.4 (*)    All other components within normal limits  LACTATE DEHYDROGENASE - Abnormal; Notable for the following components:   LDH 194 (*)    All other components within normal limits  TRIGLYCERIDES - Abnormal; Notable for the following components:   Triglycerides  512 (*)    All other components within normal limits  C-REACTIVE PROTEIN - Abnormal; Notable for the following components:   CRP 7.8 (*)    All other components within normal limits  CULTURE, BLOOD (ROUTINE X 2)  CULTURE, BLOOD (ROUTINE X 2)  D-DIMER, QUANTITATIVE (NOT AT [facility])  PROCALCITONIN  FIBRINOGEN  FERRITIN  LACTIC ACID, PLASMA  I-STAT BETA HCG BLOOD, ED ([facility], [facility], [facility] ONLY)    EKG EKG Interpretation  Date/Time:  Sunday July 25 2019 18:36:08 EST Ventricular Rate:  89 PR Interval:    QRS Duration: 77 QT Interval:  362 QTC Calculation: 441 R Axis:   12 Text Interpretation: Sinus rhythm Probable left atrial enlargement Low  voltage, precordial leads RSR' in V1 or V2, probably normal variant since last tracing no significant change Confirmed by Malvin Johns [phone]) on 07/25/2019 10:43:55 PM   Radiology CT Angio Chest PE W and/or Wo Contrast  Result Date: 07/25/2019 CLINICAL DATA:  Worsening shortness of breath, COVID-19 positive for 2 weeks. EXAM: CT ANGIOGRAPHY CHEST WITH CONTRAST TECHNIQUE: Multidetector CT imaging of the chest was performed using the standard protocol during bolus administration of intravenous contrast. Multiplanar CT image reconstructions and MIPs were obtained to evaluate the vascular anatomy. CONTRAST:  89m OMNIPAQUE IOHEXOL 350 MG/ML SOLN COMPARISON:  Radiograph earlier this day. Chest CTA 12/01/2017 FINDINGS: Cardiovascular: There are no filling defects within the pulmonary arteries to suggest pulmonary embolus. Thoracic aorta is normal in caliber. No aortic dissection. Heart is normal in size. No pericardial effusion. Mediastinum/Nodes: Small mediastinal lymph nodes not enlarged by size criteria. No hilar adenopathy. Decompressed esophagus. No focal thyroid nodule. Lungs/Pleura: Bilateral heterogeneous primarily ground-glass opacities within both lungs with a peripheral predominant distribution. Mild central bronchial thickening. No evidence of pulmonary edema. No pleural fluid. Upper Abdomen: Hepatic steatosis. No acute findings. Musculoskeletal: There are no acute or suspicious osseous abnormalities. Mild broad-based rightward scoliotic curvature of spine. Review of the MIP images confirms the above findings. IMPRESSION: 1. No pulmonary embolus. 2. Heterogeneous ground-glass opacities within both lungs with a peripheral predominant distribution. Pattern most consistent with COVID-19 pneumonia. 3. Incidental hepatic steatosis in the upper abdomen. Electronically Signed   By: MKeith RakeM.D.   On: 07/25/2019 23:05   DG Chest Port 1 View  Result Date: 07/25/2019 CLINICAL DATA:  Shortness of  breath EXAM: PORTABLE CHEST 1 VIEW COMPARISON:  12/01/2017 FINDINGS: The heart size and mediastinal contours are within normal limits. Heterogeneous airspace opacity of the bilateral lung bases. The visualized skeletal structures are unremarkable. IMPRESSION: Heterogeneous airspace opacity of the bilateral lung bases, consistent with infection. Electronically Signed   By: AEddie CandleM.D.   On: 07/25/2019 20:36    Procedures Procedures (including critical care time)  Medications Ordered in ED Medications  azithromycin (ZITHROMAX) 500 mg in sodium chloride 0.9 % 250 mL IVPB (500 mg Intravenous New Bag/Given 07/25/19 2255)  cefTRIAXone (ROCEPHIN) 1 g in sodium chloride 0.9 % 100 mL IVPB (0 g Intravenous Stopped 07/25/19 2224)  morphine 4 MG/ML injection 4 mg (4 mg Intravenous Given 07/25/19 2116)  iohexol (OMNIPAQUE) 350 MG/ML injection 75 mL (75 mLs Intravenous Contrast Given 07/25/19 2251)    ED Course  I have reviewed the triage vital signs and the nursing notes.  Pertinent labs & imaging results that were available during my care of the patient were reviewed by me and considered in my medical decision making (see chart for details).  Clinical Course as of Jul 24 2333  SNancy Fetter  Jul 25, 2019  2328 Dr. Hal Hope   [BM]    Clinical Course User Index [BM] Gari Crown   MDM Rules/Calculators/A&P     Above test was Covid positive on 07/22/2019  53 year old female Covid positive arrives with shortness of breath and right-sided pleurisy.  She has been symptomatic for 5 days but tested positive around 2 weeks ago.  She reports hypoxia on home monitor down to mid 80s while ambulating.  She denies any chest pain so long as she is not coughing or pressing on her right lower ribs.  On physical examination patient is tired appearing but no acute distress, cranial nerves intact, no meningeal signs, heart regular rate and rhythm, lungs slightly diminished, chest is tender to palpation of  the right lower ribs without evidence of injury, abdomen soft nontender peritoneal signs, neurovascular intact to all 4 extremities with equal distal pulses, no evidence of DVT.  Will obtain Covid lab work as well as CT angio PE study.  As patient only with chest pain with palpation of the chest wall and cough, low suspicion for ACS, suspect musculoskeletal etiology. - EKG: Sinus rhythm Probable left atrial enlargement Low voltage, precordial leads RSR' in V1 or V2, probably normal variant since last tracing no significant change Confirmed by Malvin Johns [phone]) on 07/25/2019 10:43:55 PM  CBC nonacute Lactic 3.1 CMP nonacute LDH 194 Fibrinogen within normal limits Procalcitonin within normal limits Triglycerides 512 D-dimer within normal limits Beta-hCG negative CRP 7.8 Ferritin within normal limits  CXR:  IMPRESSION:  Heterogeneous airspace opacity of the bilateral lung bases,  consistent with infection.   CT Angio PE Study:  IMPRESSION:  1. No pulmonary embolus.  2. Heterogeneous ground-glass opacities within both lungs with a  peripheral predominant distribution. Pattern most consistent with  COVID-19 pneumonia.  3. Incidental hepatic steatosis in the upper abdomen.    Patient ambulated in room with nursing staff, SPO2 decreased to 88% on room air.  Plan of care at this time is admission to hospital service for hypoxia due to COVID-19 viral infection. - Patient reassessed, tired appearing but no acute distress, SPO2 mid 90s on 2 L nasal cannula.  She states understanding of work-up above and is agreeable for admission at this time.  Suspect patient's right-sided pain is musculoskeletal in etiology with reassuring EKG, negative PE study.  Doubt ACS, dissection or other acute cardiopulmonary etiology of her symptoms.  Additionally no abdominal pain or tenderness to suggest intra-abdominal cause. - Discussed case with Dr. Hal Hope from hospitalist service will be seeing patient  for admission.  Caitlin Barnett was evaluated in Emergency Department on 07/25/2019 for the symptoms described in the history of present illness. She was evaluated in the context of the global COVID-19 pandemic, which necessitated consideration that the patient might be at risk for infection with the SARS-CoV-2 virus that causes COVID-19. Institutional protocols and algorithms that pertain to the evaluation of patients at risk for COVID-19 are in a state of rapid change based on information released by regulatory bodies including the CDC and federal and state organizations. These policies and algorithms were followed during the patient's care in the ED.  Note: Portions of this report may have been transcribed using voice recognition software. Every effort was made to ensure accuracy; however, inadvertent computerized transcription errors may still be present. Final Clinical Impression(s) / ED Diagnoses Final diagnoses:  CHYIF-02 virus detected  Pneumonia of both lungs due to infectious organism, unspecified part of lung  Hypoxia  Rx / DC Orders ED Discharge Orders    None       Gari Crown 07/25/19 2337    Malvin Johns, MD 07/25/19 [phone]

## 2019-07-25 NOTE — ED Triage Notes (Signed)
Patient arrives via EMS due to being COVID+ and worsening SOB. Per ems and patient, she has been covid positive for 14 days (tested twice, most recent test on 12/10). Today, she had worsening SOB and tried breathing tx at home with no relief. The shortness of breath has began to cause some chest pain as well.  Patient arrives 94% on Room air, unable to take deep breaths.

## 2019-07-25 NOTE — ED Notes (Signed)
Ambulated with patient. she ranged from 93%-92% while walking. Decreased to 88%-89% for one minute when she sat down, then back up to 92%.

## 2019-07-25 NOTE — ED Notes (Signed)
Pt to CT

## 2019-07-26 ENCOUNTER — Encounter (HOSPITAL_COMMUNITY): Payer: Self-pay | Admitting: Internal Medicine

## 2019-07-26 DIAGNOSIS — J9600 Acute respiratory failure, unspecified whether with hypoxia or hypercapnia: Secondary | ICD-10-CM | POA: Diagnosis present

## 2019-07-26 DIAGNOSIS — U071 COVID-19: Principal | ICD-10-CM

## 2019-07-26 DIAGNOSIS — J9601 Acute respiratory failure with hypoxia: Secondary | ICD-10-CM | POA: Diagnosis present

## 2019-07-26 DIAGNOSIS — E039 Hypothyroidism, unspecified: Secondary | ICD-10-CM

## 2019-07-26 DIAGNOSIS — E1165 Type 2 diabetes mellitus with hyperglycemia: Secondary | ICD-10-CM

## 2019-07-26 LAB — CBC WITH DIFFERENTIAL/PLATELET
Abs Immature Granulocytes: 0.09 10*3/uL — ABNORMAL HIGH (ref 0.00–0.07)
Basophils Absolute: 0 10*3/uL (ref 0.0–0.1)
Basophils Relative: 0 %
Eosinophils Absolute: 0 10*3/uL (ref 0.0–0.5)
Eosinophils Relative: 0 %
HCT: 42.3 % (ref 36.0–46.0)
Hemoglobin: 14 g/dL (ref 12.0–15.0)
Immature Granulocytes: 1 %
Lymphocytes Relative: 13 %
Lymphs Abs: 1 10*3/uL (ref 0.7–4.0)
MCH: 30.8 pg (ref 26.0–34.0)
MCHC: 33.1 g/dL (ref 30.0–36.0)
MCV: 93.2 fL (ref 80.0–100.0)
Monocytes Absolute: 0.5 10*3/uL (ref 0.1–1.0)
Monocytes Relative: 6 %
Neutro Abs: 6 10*3/uL (ref 1.7–7.7)
Neutrophils Relative %: 80 %
Platelets: 221 10*3/uL (ref 150–400)
RBC: 4.54 MIL/uL (ref 3.87–5.11)
RDW: 12.6 % (ref 11.5–15.5)
WBC: 7.5 10*3/uL (ref 4.0–10.5)
nRBC: 0 % (ref 0.0–0.2)

## 2019-07-26 LAB — COMPREHENSIVE METABOLIC PANEL
ALT: 33 U/L (ref 0–44)
AST: 28 U/L (ref 15–41)
Albumin: 3.1 g/dL — ABNORMAL LOW (ref 3.5–5.0)
Alkaline Phosphatase: 79 U/L (ref 38–126)
Anion gap: 13 (ref 5–15)
BUN: 17 mg/dL (ref 6–20)
CO2: 22 mmol/L (ref 22–32)
Calcium: 8.6 mg/dL — ABNORMAL LOW (ref 8.9–10.3)
Chloride: 102 mmol/L (ref 98–111)
Creatinine, Ser: 0.47 mg/dL (ref 0.44–1.00)
GFR calc Af Amer: >60 mL/min (ref >60)
GFR calc non Af Amer: >60 mL/min (ref >60)
Glucose, Bld: 177 mg/dL — ABNORMAL HIGH (ref 70–99)
Potassium: 3.9 mmol/L (ref 3.5–5.1)
Sodium: 137 mmol/L (ref 135–145)
Total Bilirubin: 0.5 mg/dL (ref 0.3–1.2)
Total Protein: 6.2 g/dL — ABNORMAL LOW (ref 6.5–8.1)

## 2019-07-26 LAB — C-REACTIVE PROTEIN: CRP: 8.3 mg/dL — ABNORMAL HIGH (ref <1.0)

## 2019-07-26 LAB — HIV ANTIBODY (ROUTINE TESTING W REFLEX): HIV Screen 4th Generation wRfx: NONREACTIVE

## 2019-07-26 LAB — CBC
HCT: 42.5 % (ref 36.0–46.0)
Hemoglobin: 14.2 g/dL (ref 12.0–15.0)
MCH: 31.1 pg (ref 26.0–34.0)
MCHC: 33.4 g/dL (ref 30.0–36.0)
MCV: 93 fL (ref 80.0–100.0)
Platelets: 246 10*3/uL (ref 150–400)
RBC: 4.57 MIL/uL (ref 3.87–5.11)
RDW: 12.7 % (ref 11.5–15.5)
WBC: 9.9 10*3/uL (ref 4.0–10.5)
nRBC: 0 % (ref 0.0–0.2)

## 2019-07-26 LAB — CREATININE, SERUM
Creatinine, Ser: 0.51 mg/dL (ref 0.44–1.00)
GFR calc Af Amer: >60 mL/min (ref >60)
GFR calc non Af Amer: >60 mL/min (ref >60)

## 2019-07-26 LAB — CBG MONITORING, ED
Glucose-Capillary: 140 mg/dL — ABNORMAL HIGH (ref 70–99)
Glucose-Capillary: 104 mg/dL — ABNORMAL HIGH (ref 70–99)
Glucose-Capillary: 122 mg/dL — ABNORMAL HIGH (ref 70–99)
Glucose-Capillary: 154 mg/dL — ABNORMAL HIGH (ref 70–99)

## 2019-07-26 LAB — FERRITIN: Ferritin: 125 ng/mL (ref 11–307)

## 2019-07-26 LAB — TROPONIN I (HIGH SENSITIVITY)
Troponin I (High Sensitivity): <2 ng/L (ref <18)
Troponin I (High Sensitivity): <2 ng/L (ref <18)

## 2019-07-26 LAB — LACTIC ACID, PLASMA
Lactic Acid, Venous: 2.1 mmol/L (ref 0.5–1.9)
Lactic Acid, Venous: 1.4 mmol/L (ref 0.5–1.9)

## 2019-07-26 LAB — ABO/RH: ABO/RH(D): O POS

## 2019-07-26 MED ORDER — TOPIRAMATE 25 MG PO TABS: 25.0000 mg | ORAL_TABLET | Freq: Two times a day (BID) | ORAL | Status: DC

## 2019-07-26 MED ORDER — MORPHINE SULFATE (PF) 2 MG/ML IV SOLN
1.0000 mg | INTRAVENOUS | Status: DC | PRN
  Administered 2019-07-26 (×2): 1 mg via INTRAVENOUS
  Filled 2019-07-26 (×3): qty 1

## 2019-07-26 MED ORDER — VITAMIN D 25 MCG (1000 UNIT) PO TABS
5000.0000 [IU] | ORAL_TABLET | Freq: Two times a day (BID) | ORAL | Status: DC
  Administered 2019-07-26 – 2019-08-01 (×14): 5000 [IU] via ORAL
  Filled 2019-07-26 (×16): qty 5

## 2019-07-26 MED ORDER — LORAZEPAM 0.5 MG PO TABS
0.5000 mg | ORAL_TABLET | Freq: Three times a day (TID) | ORAL | Status: DC | PRN
  Administered 2019-07-26 – 2019-07-31 (×6): 0.5 mg via ORAL
  Filled 2019-07-26 (×6): qty 1

## 2019-07-26 MED ORDER — ACETAMINOPHEN 325 MG PO TABS: 650.0000 mg | ORAL_TABLET | Freq: Four times a day (QID) | ORAL | Status: DC | PRN

## 2019-07-26 MED ORDER — ACETAMINOPHEN 650 MG RE SUPP: 650.0000 mg | Freq: Four times a day (QID) | RECTAL | Status: DC | PRN

## 2019-07-26 MED ORDER — HYDROCODONE-ACETAMINOPHEN 5-325 MG PO TABS
1.0000 | ORAL_TABLET | ORAL | Status: DC | PRN
  Administered 2019-07-27 – 2019-07-28 (×4): 2 via ORAL
  Administered 2019-07-29 – 2019-08-01 (×5): 1 via ORAL
  Filled 2019-07-26: qty 2
  Filled 2019-07-26 (×3): qty 1
  Filled 2019-07-26 (×2): qty 2
  Filled 2019-07-26: qty 1
  Filled 2019-07-26 (×2): qty 2
  Filled 2019-07-26 (×2): qty 1

## 2019-07-26 MED ORDER — ONDANSETRON HCL 4 MG PO TABS: 4.0000 mg | ORAL_TABLET | Freq: Four times a day (QID) | ORAL | Status: DC | PRN

## 2019-07-26 MED ORDER — SODIUM CHLORIDE 0.9 % IV BOLUS
1000.0000 mL | Freq: Once | INTRAVENOUS | Status: AC
  Administered 2019-07-26: 1000 mL via INTRAVENOUS

## 2019-07-26 MED ORDER — ENOXAPARIN SODIUM 40 MG/0.4ML ~~LOC~~ SOLN
40.0000 mg | SUBCUTANEOUS | Status: DC
  Administered 2019-07-26 – 2019-08-01 (×7): 40 mg via SUBCUTANEOUS
  Filled 2019-07-26 (×7): qty 0.4

## 2019-07-26 MED ORDER — PRAVASTATIN SODIUM 10 MG PO TABS
20.0000 mg | ORAL_TABLET | Freq: Every day | ORAL | Status: DC
  Administered 2019-07-26 – 2019-07-31 (×7): 20 mg via ORAL
  Filled 2019-07-26 (×8): qty 2

## 2019-07-26 MED ORDER — FENOFIBRATE 160 MG PO TABS
160.0000 mg | ORAL_TABLET | Freq: Every day | ORAL | Status: DC
  Administered 2019-07-26 – 2019-08-01 (×7): 160 mg via ORAL
  Filled 2019-07-26 (×7): qty 1

## 2019-07-26 MED ORDER — TRAZODONE HCL 50 MG PO TABS
75.0000 mg | ORAL_TABLET | Freq: Every day | ORAL | Status: DC
  Administered 2019-07-26: 75 mg via ORAL
  Administered 2019-07-26 – 2019-07-31 (×6): 150 mg via ORAL
  Filled 2019-07-26: qty 3
  Filled 2019-07-26: qty 2
  Filled 2019-07-26 (×5): qty 3

## 2019-07-26 MED ORDER — MORPHINE SULFATE (PF) 4 MG/ML IV SOLN
3.0000 mg | INTRAVENOUS | Status: DC | PRN
  Administered 2019-07-28 – 2019-07-30 (×6): 3 mg via INTRAVENOUS
  Filled 2019-07-26 (×6): qty 1

## 2019-07-26 MED ORDER — SODIUM CHLORIDE 0.9 % IV SOLN
200.0000 mg | Freq: Once | INTRAVENOUS | Status: AC
  Administered 2019-07-26: 200 mg via INTRAVENOUS
  Filled 2019-07-26: qty 40

## 2019-07-26 MED ORDER — SODIUM CHLORIDE 0.9 % IV SOLN
Freq: Once | INTRAVENOUS | Status: AC
  Administered 2019-07-26: 20:00:00 via INTRAVENOUS

## 2019-07-26 MED ORDER — MONTELUKAST SODIUM 10 MG PO TABS
10.0000 mg | ORAL_TABLET | Freq: Every day | ORAL | Status: DC
  Administered 2019-07-26 – 2019-08-01 (×7): 10 mg via ORAL
  Filled 2019-07-26 (×8): qty 1

## 2019-07-26 MED ORDER — ALBUTEROL SULFATE HFA 108 (90 BASE) MCG/ACT IN AERS
2.0000 | INHALATION_SPRAY | RESPIRATORY_TRACT | Status: DC | PRN
  Administered 2019-07-27 – 2019-07-31 (×2): 2 via RESPIRATORY_TRACT
  Filled 2019-07-26 (×2): qty 6.7

## 2019-07-26 MED ORDER — SODIUM CHLORIDE 0.9 % IV SOLN
100.0000 mg | Freq: Every day | INTRAVENOUS | Status: AC
  Administered 2019-07-27 – 2019-07-30 (×4): 100 mg via INTRAVENOUS
  Filled 2019-07-26 (×4): qty 20

## 2019-07-26 MED ORDER — PANTOPRAZOLE SODIUM 40 MG PO TBEC
40.0000 mg | DELAYED_RELEASE_TABLET | Freq: Every day | ORAL | Status: DC
  Administered 2019-07-26 – 2019-08-01 (×7): 40 mg via ORAL
  Filled 2019-07-26 (×8): qty 1

## 2019-07-26 MED ORDER — MAGNESIUM OXIDE 400 (241.3 MG) MG PO TABS
200.0000 mg | ORAL_TABLET | Freq: Every day | ORAL | Status: DC
  Administered 2019-07-26 – 2019-08-01 (×7): 200 mg via ORAL
  Filled 2019-07-26 (×7): qty 1

## 2019-07-26 MED ORDER — GABAPENTIN 600 MG PO TABS: 300.0000 mg | ORAL_TABLET | Freq: Three times a day (TID) | ORAL | Status: DC

## 2019-07-26 MED ORDER — LEVOTHYROXINE SODIUM 25 MCG PO TABS
125.0000 ug | ORAL_TABLET | Freq: Every day | ORAL | Status: DC
  Administered 2019-07-26 – 2019-08-01 (×7): 125 ug via ORAL
  Filled 2019-07-26 (×7): qty 1

## 2019-07-26 MED ORDER — PREGABALIN 75 MG PO CAPS
75.0000 mg | ORAL_CAPSULE | Freq: Three times a day (TID) | ORAL | Status: DC
  Administered 2019-07-26 – 2019-08-01 (×18): 75 mg via ORAL
  Filled 2019-07-26 (×5): qty 1
  Filled 2019-07-26: qty 3
  Filled 2019-07-26: qty 1
  Filled 2019-07-26: qty 3
  Filled 2019-07-26 (×9): qty 1
  Filled 2019-07-26: qty 3

## 2019-07-26 MED ORDER — ONDANSETRON HCL 4 MG/2ML IJ SOLN
4.0000 mg | Freq: Four times a day (QID) | INTRAMUSCULAR | Status: DC | PRN
  Administered 2019-07-26: 4 mg via INTRAVENOUS
  Filled 2019-07-26: qty 2

## 2019-07-26 MED ORDER — INSULIN ASPART 100 UNIT/ML ~~LOC~~ SOLN
0.0000 [IU] | Freq: Three times a day (TID) | SUBCUTANEOUS | Status: DC
  Administered 2019-07-26 – 2019-07-27 (×3): 2 [IU] via SUBCUTANEOUS
  Administered 2019-07-27: 5 [IU] via SUBCUTANEOUS
  Administered 2019-07-28: 8 [IU] via SUBCUTANEOUS
  Administered 2019-07-28: 3 [IU] via SUBCUTANEOUS
  Administered 2019-07-28: 2 [IU] via SUBCUTANEOUS
  Administered 2019-07-29: 3 [IU] via SUBCUTANEOUS
  Administered 2019-07-29 – 2019-07-30 (×3): 8 [IU] via SUBCUTANEOUS
  Administered 2019-07-30: 5 [IU] via SUBCUTANEOUS
  Administered 2019-07-30 – 2019-07-31 (×2): 11 [IU] via SUBCUTANEOUS

## 2019-07-26 MED ORDER — ASPIRIN EC 81 MG PO TBEC
81.0000 mg | DELAYED_RELEASE_TABLET | Freq: Every day | ORAL | Status: DC
  Administered 2019-07-26 – 2019-08-01 (×7): 81 mg via ORAL
  Filled 2019-07-26 (×7): qty 1

## 2019-07-26 NOTE — Progress Notes (Signed)
Pt admitted for dyspnea. Found to be COVID + with PNA. See H&P for full details. Continue remdesivir for 5 days. Continue steroids for 10 days. Add fluids. Labs look ok on the whole. Trend. Wean O2 as able. Currently on 2L [place]. Otherwise, continue as per H&P.   General: 53 y.o. female resting in bed in NAD Cardiovascular: RRR, +S1, S2, no m/g/r, equal pulses throughout Respiratory: b/l soft rhonchi, normal WOB GI: BS+, NDNT, no masses noted, no organomegaly noted MSK: No e/c/c Neuro: A&O x 3, no focal deficits Psyc: Appropriate interaction and affect, calm/cooperative  .Jonnie Finner, DO

## 2019-07-26 NOTE — ED Notes (Signed)
Patient is resting comfortably. 

## 2019-07-26 NOTE — H&P (Addendum)
History and Physical    Caitlin Barnett PYY:[id number] DOB: 1966/07/04 DOA: 07/25/2019  PCP: Unk Pinto, MD  Patient coming from: Home.  Chief Complaint: Shortness of breath.  Chest pain.  HPI: Caitlin Barnett is a 53 y.o. female with history of diabetes mellitus type 2, hyperlipidemia, hypothyroidism, asthma presents to the ER because of worsening shortness of breath.  Patient states she was diagnosed with COVID-19 pneumonia about 14 days ago.  Patient was rechecked again 3 days ago when she was positive.  Patient has been tested positive for Covid also.  Over the last 4 days patient has been feeling very short of breath with right-sided chest pain pleuritic in nature.  As nonproductive cough severe weakness fatigue and taste loss.  Has been having subjective feeling of fever chills no nausea vomiting or diarrhea.  ED Course: In the ER CT angiogram of the chest was negative for pulmonary embolism but does show infiltrates concerning for COVID-19.  EKG shows normal sinus rhythm.  Labs show lactic acid of 3.1 which improved with fluids.  CRP 7.8 triglycerides 512 LDH 194 ferritin 116.  Procalcitonin was less than 0.1.  Patient was given IV remdesivir 1 dose of IV Decadron admitted for further management of acute respiratory failure secondary to COVID-19.  Patient blood pressure was in the low normal he received fluids in the ER.  Review of Systems: As per HPI, rest all negative.   Past Medical History:  Diagnosis Date  . Diabetes mellitus without complication ([place])   . Diverticulitis   . GERD (gastroesophageal reflux disease)   . Hyperlipidemia   . IBS (irritable bowel syndrome)   . Migraine   . Pneumonia 11/2017  . Thyroid disease   . UTI (urinary tract infection)     Past Surgical History:  Procedure Laterality Date  . CESAREAN SECTION  1993  . cystoscopies     multiple  . FUNCTIONAL ENDOSCOPIC SINUS SURGERY  2003  . LAPAROSCOPIC OVARIAN CYSTECTOMY  1983  . TONSILLECTOMY  1973   . TYMPANOPLASTY W/ MASTOIDECTOMY Left 2016 & 2017     reports that she quit smoking about 23 months ago. Her smoking use included cigarettes. She has a 15.00 pack-year smoking history. She has never used smokeless tobacco. She reports that she does not drink alcohol or use drugs.  Allergies  Allergen Reactions  . Tetanus Toxoids Other (See Comments)    Injection site abcess  . Sulfa Antibiotics Hives    itching    Family History  Problem Relation Age of Onset  . Mental illness Mother        depression  . Suicidality Mother   . Alcohol abuse Father   . Kidney disease Father   . Mental illness Father        depression  . Suicidality Father   . Breast cancer Maternal Aunt        breast cancer  . Heart disease Maternal Grandmother   . Breast cancer Maternal Grandmother   . Suicidality Maternal Grandfather   . Stroke Paternal Grandmother   . Heart disease Paternal Grandmother   . Heart disease Paternal Grandfather     Prior to Admission medications   Medication Sig Start Date End Date Taking? Authorizing Provider  blood glucose meter kit and supplies KIT Dispense based on patient and insurance preference. Use up to four times daily as directed. (FOR ICD-9 250.00, 250.01). 12/05/17  Yes Annita Brod, MD  albuterol (PROVENTIL HFA;VENTOLIN HFA) 108 (90 Base) MCG/ACT inhaler  2 inhalations  15-20  minutes apart every 4 hours as needed to rescue Asthma Patient taking differently: Inhale 2 puffs into the lungs every 4 (four) hours as needed (asthma). Inhale 15-20  minutes apart every 4 hours as needed 07/29/18   Unk Pinto, MD  albuterol (PROVENTIL) (2.5 MG/3ML) 0.083% nebulizer solution Take 3 mLs (2.5 mg total) by nebulization every 6 (six) hours as needed for wheezing or shortness of breath. 12/05/17   Annita Brod, MD  aspirin EC 81 MG tablet Take 81 mg by mouth daily.    [provider]  azithromycin (ZITHROMAX) 250 MG tablet Take 2 tablets with Food on   Day 1, then 1 tablet Daily with Food for Infection Patient taking differently: Take 250-500 mg by mouth See admin instructions. 523m with food on day 1, then 2540mtablet daily with food 07/22/19   McUnk PintoMD  butalbital-acetaminophen-caffeine (FIORICET, ESGIC) 50-325-40 MG tablet TAKE 1/2 TO 1 TABLET EVERY 4 HOURS IF NEEDED FOR HEADACHE Patient taking differently: Take 0.5 tablets by mouth every 4 (four) hours as needed for headache.  07/29/18   McUnk PintoMD  cetirizine (ZYRTEC) 10 MG tablet Take 10 mg by mouth daily.    [provider]  Cholecalciferol 1000 units capsule Take 5,000 Units by mouth 2 (two) times daily.     [provider]  Cinnamon 500 MG TABS Take 2 capsules by mouth 2 (two) times daily.    [provider]  cyanocobalamin (,VITAMIN B-12,) 1000 MCG/ML injection INJECT 1 ML (1,000 MCG TOTAL) INTO THE MUSCLE EVERY 30 (THIRTY) DAYS. 05/20/19   McUnk PintoMD  dexamethasone (DECADRON) 4 MG tablet Take 1 tab 3 x day - 3 days, then 2 x day - 3 days, then 1 tab daily Patient taking differently: Take 4 mg by mouth daily. Take 1 tab 3 x day - 3 days, then 2 x day - 3 days, then 1 tab daily 07/22/19   McUnk PintoMD  doxycycline (VIBRAMYCIN) 100 MG capsule Take 1 capsule twice daily with food Patient taking differently: Take 100 mg by mouth 2 (two) times daily. Take with food 05/11/19   CoVicie MuttersPA-C  empagliflozin (JARDIANCE) 25 MG TABS tablet Take 1 tablet Daily for Diabetes Patient taking differently: Take 25 mg by mouth daily.  12/03/18   McUnk PintoMD  estrogen, conjugated,-medroxyprogesterone (PREMPRO) 0.45-1.5 MG tablet TAKE 1 TABLET BY MOUTH EVERY DAY Patient taking differently: Take 1 tablet by mouth daily.  01/09/19   McUnk PintoMD  fenofibrate (TRICOR) 145 MG tablet Take 1 tablet Daily for Triglycerides (Blood Fats) Patient taking differently: Take 145 mg by mouth daily.  05/29/19   McUnk PintoMD    fluconazole (DIFLUCAN) 150 MG tablet Take 1 tablet (150 mg total) by mouth daily. 05/11/19   CoVicie MuttersPA-C  fluticasone (FLONASE) 50 MCG/ACT nasal spray Place 1 spray into both nostrils daily. 08/06/17   CoLiane ComberNP  furosemide (LASIX) 40 MG tablet TAKE 40 MG (1 TAB) BY MOUTH DAILY. MAY TAKE 1/2 TAB ON MOST DAYS AND 1 FULL TAB DAILY WHEN NEEDED. Patient taking differently: Take 20-40 mg by mouth daily. May take 1/2 tab on most days and 1 full tab daily when needed. 02/16/19   CrLelon PerlaMD  gabapentin (NEURONTIN) 600 MG tablet TAKE 1/2 TO 1 TABLET 3 TO 4 X /DAY FOR DIABETIC NEUROPATHY Patient taking differently: Take 300-600 mg by mouth 3 (three) times daily. 3 to 4 times  day for Diabetic Neuropathy 03/25/19   Unk Pinto, MD  ibuprofen (ADVIL,MOTRIN) 200 MG tablet Take 800 mg by mouth as needed for moderate pain.    [provider]  levofloxacin (LEVAQUIN) 500 MG tablet Take 1 tablet (500 mg total) by mouth daily. 05/11/19   Vicie Mutters, PA-C  levothyroxine (SYNTHROID) 125 MCG tablet Take 1 tablet daily on an empty stomach with only water for 30 minutes & no Antacid meds, Calcium or Magnesium for 4 hours & avoid Biotin Patient taking differently: Take 125 mcg by mouth daily before breakfast. On an empty stomach with only water for 30 minutes & no Antacid meds, Calcium or Magnesium for 4 hours & avoid Biotin 05/20/19   Unk Pinto, MD  Magnesium 250 MG TABS Take 250 mg by mouth daily.    [provider]  metFORMIN (GLUCOPHAGE-XR) 500 MG 24 hr tablet TAKE 2 TABLETS 2 X / DAY FOR DIABETES Patient taking differently: Take 1,000 mg by mouth 2 (two) times daily.  01/13/19   Liane Comber, NP  montelukast (SINGULAIR) 10 MG tablet TAKE 1 TABLET DAILY FOR ALLERGIES Patient taking differently: Take 10 mg by mouth daily.  01/14/19   Vicie Mutters, PA-C  omeprazole (PRILOSEC) 40 MG capsule TAKE 1 CAPSULE DAILY FOR ACID INDIGESTION Patient taking differently:  Take 40 mg by mouth daily.  12/26/18   Unk Pinto, MD  potassium chloride (K-DUR) 10 MEQ tablet Take 1 tablet Daily Patient taking differently: Take 10 mEq by mouth daily.  12/26/18   Unk Pinto, MD  pravastatin (PRAVACHOL) 20 MG tablet Take 1 tablet at Bedtime for Cholesterol 05/20/19   Unk Pinto, MD  pregabalin (LYRICA) 75 MG capsule Take 1 capsule 3 x /day for Neuropathy Pains Patient taking differently: Take 75 mg by mouth 3 (three) times daily. For Neuropathy Pains 01/09/19   Unk Pinto, MD  topiramate (TOPAMAX) 25 MG tablet TAKE 1 TO 2 TABLET(S) 2 X /DAY AT SUPPERTIME & BEDTIME FOR DIETING & WEIGHT LOSS Patient taking differently: Take 25-50 mg by mouth 2 (two) times daily. At [place] for Dieting & Weight Loss 06/06/19   Unk Pinto, MD  traZODone (DESYREL) 150 MG tablet TAKE 1/2 TO 1 TABLET 1 HOUR BEFORE BEDTIME Patient taking differently: Take 75-150 mg by mouth at bedtime. 1 hour before Bedtime 04/09/19   Liane Comber, NP  valACYclovir (VALTREX) 1000 MG tablet Take 1 tablet 3 x day for Fever Blisters / Cold Sores 09/30/18   Unk Pinto, MD    Physical Exam: Constitutional: Moderately built and nourished. Vitals:   07/26/19 0415 07/26/19 0430 07/26/19 0445 07/26/19 0500  BP:      Pulse: 93 86 86 84  Resp: (!) 22 20 (!) 22 (!) 21  Temp:      TempSrc:      SpO2: 95% 95% 95% 95%  Weight:      Height:       Eyes: Anicteric no pallor. ENMT: No discharge from the ears eyes nose or mouth. Neck: No mass felt.  No neck rigidity. Respiratory: No rhonchi or crepitations. Cardiovascular: S1-S2 heard. Abdomen: Soft nontender bowel sounds present. Musculoskeletal: No edema.  No joint effusion. Skin: No rash. Neurologic: Alert awake oriented to time place and person.  Moves all extremities. Psychiatric: Appears normal per normal affect.   Labs on Admission: I have personally reviewed following labs and imaging studies  CBC: Recent Labs    Lab 2019/08/06 1944 07/26/19 0048 07/26/19 0435  WBC 9.2 9.9 7.5  NEUTROABS 7.4  --  6.0  HGB 15.5* 14.2 14.0  HCT 46.1* 42.5 42.3  MCV 92.4 93.0 93.2  PLT 263 246 233   Basic Metabolic Panel: Recent Labs  Lab 07/25/19 1944 07/26/19 0048  NA 137  --   K 4.2  --   CL 99  --   CO2 23  --   GLUCOSE 196*  --   BUN 21*  --   CREATININE 0.53 0.51  CALCIUM 10.4*  --    GFR: Estimated Creatinine Clearance: 78.6 mL/min (by C-G formula based on SCr of 0.51 mg/dL). Liver Function Tests: Recent Labs  Lab 07/25/19 1944  AST 28  ALT 38  ALKPHOS 93  BILITOT 0.5  PROT 6.9  ALBUMIN 3.6   No results for input(s): LIPASE, AMYLASE in the last 168 hours. No results for input(s): AMMONIA in the last 168 hours. Coagulation Profile: No results for input(s): INR, PROTIME in the last 168 hours. Cardiac Enzymes: No results for input(s): CKTOTAL, CKMB, CKMBINDEX, TROPONINI in the last 168 hours. BNP (last 3 results) No results for input(s): PROBNP in the last 8760 hours. HbA1C: No results for input(s): HGBA1C in the last 72 hours. CBG: No results for input(s): GLUCAP in the last 168 hours. Lipid Profile: Recent Labs    07/25/19 1944  TRIG 512*   Thyroid Function Tests: No results for input(s): TSH, T4TOTAL, FREET4, T3FREE, THYROIDAB in the last 72 hours. Anemia Panel: Recent Labs    07/25/19 2000  FERRITIN 116   Urine analysis:    Component Value Date/Time   COLORURINE YELLOW 08/20/2018 1546   APPEARANCEUR CLEAR 08/20/2018 1546   LABSPEC 1.040 (H) 08/20/2018 1546   PHURINE 6.0 08/20/2018 1546   GLUCOSEU 3+ (A) 08/20/2018 1546   HGBUR NEGATIVE 08/20/2018 1546   BILIRUBINUR NEGATIVE 12/01/2017 2329   KETONESUR NEGATIVE 08/20/2018 1546   PROTEINUR NEGATIVE 08/20/2018 1546   NITRITE NEGATIVE 08/20/2018 1546   LEUKOCYTESUR NEGATIVE 08/20/2018 1546   Sepsis Labs: _0 (procalcitonin:4,lacticidven:4) )No results found for this or any previous visit (from the past  240 hour(s)).   Radiological Exams on Admission: CT Angio Chest PE W and/or Wo Contrast  Result Date: 07/25/2019 CLINICAL DATA:  Worsening shortness of breath, COVID-19 positive for 2 weeks. EXAM: CT ANGIOGRAPHY CHEST WITH CONTRAST TECHNIQUE: Multidetector CT imaging of the chest was performed using the standard protocol during bolus administration of intravenous contrast. Multiplanar CT image reconstructions and MIPs were obtained to evaluate the vascular anatomy. CONTRAST:  63m OMNIPAQUE IOHEXOL 350 MG/ML SOLN COMPARISON:  Radiograph earlier this day. Chest CTA 12/01/2017 FINDINGS: Cardiovascular: There are no filling defects within the pulmonary arteries to suggest pulmonary embolus. Thoracic aorta is normal in caliber. No aortic dissection. Heart is normal in size. No pericardial effusion. Mediastinum/Nodes: Small mediastinal lymph nodes not enlarged by size criteria. No hilar adenopathy. Decompressed esophagus. No focal thyroid nodule. Lungs/Pleura: Bilateral heterogeneous primarily ground-glass opacities within both lungs with a peripheral predominant distribution. Mild central bronchial thickening. No evidence of pulmonary edema. No pleural fluid. Upper Abdomen: Hepatic steatosis. No acute findings. Musculoskeletal: There are no acute or suspicious osseous abnormalities. Mild broad-based rightward scoliotic curvature of spine. Review of the MIP images confirms the above findings. IMPRESSION: 1. No pulmonary embolus. 2. Heterogeneous ground-glass opacities within both lungs with a peripheral predominant distribution. Pattern most consistent with COVID-19 pneumonia. 3. Incidental hepatic steatosis in the upper abdomen. Electronically Signed   By: MKeith RakeM.D.   On: 07/25/2019 23:05   DG Chest  Port 1 View  Result Date: 07/25/2019 CLINICAL DATA:  Shortness of breath EXAM: PORTABLE CHEST 1 VIEW COMPARISON:  12/01/2017 FINDINGS: The heart size and mediastinal contours are within normal limits.  Heterogeneous airspace opacity of the bilateral lung bases. The visualized skeletal structures are unremarkable. IMPRESSION: Heterogeneous airspace opacity of the bilateral lung bases, consistent with infection. Electronically Signed   By: Eddie Candle M.D.   On: 07/25/2019 20:36    EKG: Independently reviewed.  Normal sinus rhythm.  Assessment/Plan Principal Problem:   Acute respiratory failure due to COVID-19 [facility]) Active Problems:   Hypothyroidism   Type 2 diabetes mellitus ([place])   Claudication in peripheral vascular disease ([place])   Labile hypertension   Acute respiratory failure with hypoxia ([place])    1. Acute respiratory failure with hypoxia with sepsis-like picture secondary to COVID-19 pneumonia.  Patient received fluids in the ER.  Patient does have a history of diastolic dysfunction.  Follow lactate levels we will keep patient on IV remdesivir for now.  Patient has already finished a course of Decadron.  Closely monitor inflammatory markers.  If lactate is elevated may need more fluids. 2. Right-sided pleuritic type of chest pain likely from pneumonia. 3. Diabetes mellitus type 2 we will keep patient on moderate dose sliding scale for now. 4. Hypothyroidism on Synthroid. 5. Hypertriglyceridemia.  Recheck labs.  On statins.  May need to add Lopid if still elevated. 6. History of asthma presently not wheezing. 7. Mild hypercalcemia likely from dehydration.  Patient received fluids.  Recheck metabolic panel. 8. History of diastolic dysfunction grade 1 per 2D echo done last year.  Presently holding any diuretics.  Patient received fluids.  Given that patient has acute respiratory failure with COVID-19 infection will need inpatient status.   DVT prophylaxis: Lovenox. Code Status: Full code. Family Communication: Discussed with patient. Disposition Plan: Home. Consults called: None. Admission status: Inpatient.   Rise Patience MD [facility] Pager [phone].  If 7PM-7AM, please contact night-coverage [email or web address] Password [email or web address]  07/26/2019, 6:14 AM

## 2019-07-26 NOTE — ED Notes (Signed)
Fresh warm blanket given to pt

## 2019-07-26 NOTE — ED Notes (Signed)
Date and time results received: 07/26/19 "12:36 AM  Test: Lactic Acid Critical Value: 2.1  Name of Provider Notified: Hal Hope, MD  Orders Received? Or Actions Taken?: N/A

## 2019-07-26 NOTE — ED Notes (Signed)
Breakfast Ordered 

## 2019-07-26 NOTE — ED Notes (Signed)
Clarified with pharmacy regarding remdesivir.  Pt is to get the next dose at 10am

## 2019-07-26 NOTE — ED Notes (Signed)
Contacted pharmacy regarding remdesivir

## 2019-07-27 DIAGNOSIS — J1289 Other viral pneumonia: Secondary | ICD-10-CM

## 2019-07-27 LAB — CBC WITH DIFFERENTIAL/PLATELET
Abs Immature Granulocytes: 0.07 10*3/uL (ref 0.00–0.07)
Basophils Absolute: 0 10*3/uL (ref 0.0–0.1)
Basophils Relative: 0 %
Eosinophils Absolute: 0 10*3/uL (ref 0.0–0.5)
Eosinophils Relative: 1 %
HCT: 43.2 % (ref 36.0–46.0)
Hemoglobin: 14 g/dL (ref 12.0–15.0)
Immature Granulocytes: 1 %
Lymphocytes Relative: 25 %
Lymphs Abs: 1.2 10*3/uL (ref 0.7–4.0)
MCH: 30.8 pg (ref 26.0–34.0)
MCHC: 32.4 g/dL (ref 30.0–36.0)
MCV: 94.9 fL (ref 80.0–100.0)
Monocytes Absolute: 0.4 10*3/uL (ref 0.1–1.0)
Monocytes Relative: 8 %
Neutro Abs: 3.2 10*3/uL (ref 1.7–7.7)
Neutrophils Relative %: 65 %
Platelets: 211 10*3/uL (ref 150–400)
RBC: 4.55 MIL/uL (ref 3.87–5.11)
RDW: 12.4 % (ref 11.5–15.5)
WBC: 4.9 10*3/uL (ref 4.0–10.5)
nRBC: 0 % (ref 0.0–0.2)

## 2019-07-27 LAB — FERRITIN: Ferritin: 159 ng/mL (ref 11–307)

## 2019-07-27 LAB — D-DIMER, QUANTITATIVE: D-Dimer, Quant: 0.37 ug/mL-FEU (ref 0.00–0.50)

## 2019-07-27 LAB — COMPREHENSIVE METABOLIC PANEL
ALT: 31 U/L (ref 0–44)
AST: 47 U/L — ABNORMAL HIGH (ref 15–41)
Albumin: 2.8 g/dL — ABNORMAL LOW (ref 3.5–5.0)
Alkaline Phosphatase: 62 U/L (ref 38–126)
Anion gap: 10 (ref 5–15)
BUN: 21 mg/dL — ABNORMAL HIGH (ref 6–20)
CO2: 24 mmol/L (ref 22–32)
Calcium: 8.3 mg/dL — ABNORMAL LOW (ref 8.9–10.3)
Chloride: 102 mmol/L (ref 98–111)
Creatinine, Ser: 0.63 mg/dL (ref 0.44–1.00)
GFR calc Af Amer: >60 mL/min (ref >60)
GFR calc non Af Amer: >60 mL/min (ref >60)
Glucose, Bld: 163 mg/dL — ABNORMAL HIGH (ref 70–99)
Potassium: 3.7 mmol/L (ref 3.5–5.1)
Sodium: 136 mmol/L (ref 135–145)
Total Bilirubin: 0.9 mg/dL (ref 0.3–1.2)
Total Protein: 5.9 g/dL — ABNORMAL LOW (ref 6.5–8.1)

## 2019-07-27 LAB — MAGNESIUM: Magnesium: 1.9 mg/dL (ref 1.7–2.4)

## 2019-07-27 LAB — CBG MONITORING, ED
Glucose-Capillary: 112 mg/dL — ABNORMAL HIGH (ref 70–99)
Glucose-Capillary: 148 mg/dL — ABNORMAL HIGH (ref 70–99)

## 2019-07-27 LAB — GLUCOSE, CAPILLARY
Glucose-Capillary: 214 mg/dL — ABNORMAL HIGH (ref 70–99)
Glucose-Capillary: 202 mg/dL — ABNORMAL HIGH (ref 70–99)

## 2019-07-27 LAB — C-REACTIVE PROTEIN: CRP: 9.2 mg/dL — ABNORMAL HIGH (ref <1.0)

## 2019-07-27 LAB — PHOSPHORUS: Phosphorus: 3.2 mg/dL (ref 2.5–4.6)

## 2019-07-27 MED ORDER — DEXAMETHASONE SODIUM PHOSPHATE 10 MG/ML IJ SOLN
6.0000 mg | INTRAMUSCULAR | Status: DC
  Administered 2019-07-27 – 2019-07-29 (×3): 6 mg via INTRAVENOUS
  Filled 2019-07-27 (×3): qty 1

## 2019-07-27 MED ORDER — ENSURE ENLIVE PO LIQD
237.0000 mL | Freq: Two times a day (BID) | ORAL | Status: DC
  Administered 2019-07-27 – 2019-08-01 (×9): 237 mL via ORAL

## 2019-07-27 NOTE — ED Notes (Signed)
Pt resting comfortably

## 2019-07-27 NOTE — ED Notes (Signed)
C+ Tele ordered bfast 

## 2019-07-27 NOTE — ED Notes (Signed)
Breakfast tray @ bedside. 

## 2019-07-27 NOTE — Discharge Planning (Signed)
Transitions of Care team following for disposition needs.

## 2019-07-27 NOTE — Progress Notes (Addendum)
Caitlin Kitchen  PROGRESS NOTE    Aayana Barnett  RSW:[id number] DOB: 07-15-1966 DOA: 07/25/2019 PCP: [name], MD   Brief Narrative:   Caitlin Barnett is a 53 y.o. female with history of diabetes mellitus type 2, hyperlipidemia, hypothyroidism, asthma presents to the ER because of worsening shortness of breath.  Patient states she was diagnosed with COVID-19 pneumonia about 14 days ago.  Patient was rechecked again 3 days ago when she was positive.  Patient has been tested positive for Covid also.  Over the last 4 days patient has been feeling very short of breath with right-sided chest pain pleuritic in nature.  As nonproductive cough severe weakness fatigue and taste loss.  Has been having subjective feeling of fever chills no nausea vomiting or diarrhea.  07/27/19: Denies respiratory complaints this AM. Inflammatory markers are flat. Labs on the whole are ok. Continue current Tx   Assessment & Plan:   Principal Problem:   Acute respiratory failure due to COVID-19 [facility]) Active Problems:   Hypothyroidism   Type 2 diabetes mellitus (HCC)   Claudication in peripheral vascular disease (HCC)   Labile hypertension   Acute respiratory failure with hypoxia (HCC)  Acute respiratory failure with hypoxia COVID-19 pneumonia Right side pleurtic pain     - remdesivir, decadron     - follow inflammatory markers     - PRN albuterol, antitussives     - wean O2 as able     - steroids will help w/ pleuritic pain; it is improved this AM  Diabetes mellitus type 2     - moderate dose sliding scale; glucose is acceptable  Hypothyroidism     - continue synthroid  Hypertriglyceridemia     - continue pravastatin  History of asthma     - see above  Mild hypercalcemia      - likely from dehydration     - resolved  History of diastolic dysfunction grade 1 per 2D echo done last year     - home lasix were held     - can resume in AM  DVT prophylaxis: lovenox Code Status: FULL   Disposition Plan:  TBD  Antimicrobials:  . remdesivir   ROS:  Denies CP, N, V, ab pain. Reports some improving dyspnea . Remainder 10-pt ROS is negative for all not previously mentioned.  Subjective: "Am I getting steroids today?"  Objective: Vitals:   07/27/19 1130 07/27/19 1200 07/27/19 1300 07/27/19 1330  BP: 102/63 99/72 102/73 100/65  Pulse: 80 74 74 89  Resp: 14 (!) 22 (!) 22 20  Temp:      TempSrc:      SpO2:  95% 95% 94%  Weight:      Height:        Intake/Output Summary (Last 24 hours) at 07/27/2019 1405 Last data filed at 07/27/2019 1119 Gross per 24 hour  Intake --  Output 500 ml  Net -500 ml   Filed Weights   07/25/19 1845  Weight: 76.2 kg    Examination:  General: 53 y.o. female resting in bed in NAD Cardiovascular: RRR, +S1, S2, no m/g/r Respiratory: CTABL, no w/r/r, normal WOB GI: BS+, NDNT, softs MSK: No e/c/c Neuro: A&O x 3, no focal deficits Psyc: calm/cooperative   Data Reviewed: I have personally reviewed following labs and imaging studies.  CBC: Recent Labs  Lab 07/25/19 1944 07/26/19 0048 07/26/19 0435 07/27/19 0934  WBC 9.2 9.9 7.5 4.9  NEUTROABS 7.4  --  6.0 3.2  HGB 15.5* 14.2  14.0 14.0  HCT 46.1* 42.5 42.3 43.2  MCV 92.4 93.0 93.2 94.9  PLT 263 246 221 211   Basic Metabolic Panel: Recent Labs  Lab 07/25/19 1944 07/26/19 0048 07/26/19 0435 07/27/19 0934  NA 137  --  137 136  K 4.2  --  3.9 3.7  CL 99  --  102 102  CO2 23  --  22 24  GLUCOSE 196*  --  177* 163*  BUN 21*  --  17 21*  CREATININE 0.53 0.51 0.47 0.63  CALCIUM 10.4*  --  8.6* 8.3*  MG  --   --   --  1.9  PHOS  --   --   --  3.2   GFR: Estimated Creatinine Clearance: 78.6 mL/min (by C-G formula based on SCr of 0.63 mg/dL). Liver Function Tests: Recent Labs  Lab 07/25/19 1944 07/26/19 0435 07/27/19 0934  AST 28 28 47*  ALT 38 33 31  ALKPHOS 93 79 62  BILITOT 0.5 0.5 0.9  PROT 6.9 6.2* 5.9*  ALBUMIN 3.6 3.1* 2.8*   No results for input(s): LIPASE, AMYLASE in  the last 168 hours. No results for input(s): AMMONIA in the last 168 hours. Coagulation Profile: No results for input(s): INR, PROTIME in the last 168 hours. Cardiac Enzymes: No results for input(s): CKTOTAL, CKMB, CKMBINDEX, TROPONINI in the last 168 hours. BNP (last 3 results) No results for input(s): PROBNP in the last 8760 hours. HbA1C: No results for input(s): HGBA1C in the last 72 hours. CBG: Recent Labs  Lab 07/26/19 1235 07/26/19 1847 07/26/19 2208 07/27/19 0737 07/27/19 1156  GLUCAP 104* 122* 154* 112* 148*   Lipid Profile: Recent Labs    07/25/19 1944  TRIG 512*   Thyroid Function Tests: No results for input(s): TSH, T4TOTAL, FREET4, T3FREE, THYROIDAB in the last 72 hours. Anemia Panel: Recent Labs    07/26/19 0435 07/27/19 0934  FERRITIN 125 159   Sepsis Labs: Recent Labs  Lab 07/25/19 1944 07/25/19 2110 07/26/19 0436  PROCALCITON <0.10  --   --   LATICACIDVEN 3.1* 2.1* 1.4    Recent Results (from the past 240 hour(s))  Blood Culture (routine x 2)     Status: None (Preliminary result)   Collection Time: 07/25/19  7:44 PM   Specimen: BLOOD  Result Value Ref Range Status   Specimen Description BLOOD RIGHT ANTECUBITAL  Final   Special Requests   Final    BOTTLES DRAWN AEROBIC AND ANAEROBIC Blood Culture results may not be optimal due to an inadequate volume of blood received in culture bottles   Culture   Final    NO GROWTH < 12 HOURS Performed at [facility], [address] [place], [place] [address]    Report Status PENDING  Incomplete  Blood Culture (routine x 2)     Status: None (Preliminary result)   Collection Time: 07/25/19  8:22 PM   Specimen: BLOOD  Result Value Ref Range Status   Specimen Description BLOOD LEFT WRIST  Final   Special Requests   Final    BOTTLES DRAWN AEROBIC AND ANAEROBIC Blood Culture results may not be optimal due to an inadequate volume of blood received in culture bottles   Culture   Final    NO GROWTH <  12 HOURS Performed at [facility], [address] [place], [place] [address]    Report Status PENDING  Incomplete      Radiology Studies: CT Angio Chest PE W and/or  Wo Contrast  Result Date: 07/25/2019 CLINICAL DATA:  Worsening shortness of breath, COVID-19 positive for 2 weeks. EXAM: CT ANGIOGRAPHY CHEST WITH CONTRAST TECHNIQUE: Multidetector CT imaging of the chest was performed using the standard protocol during bolus administration of intravenous contrast. Multiplanar CT image reconstructions and MIPs were obtained to evaluate the vascular anatomy. CONTRAST:  2mL OMNIPAQUE IOHEXOL 350 MG/ML SOLN COMPARISON:  Radiograph earlier this day. Chest CTA 12/01/2017 FINDINGS: Cardiovascular: There are no filling defects within the pulmonary arteries to suggest pulmonary embolus. Thoracic aorta is normal in caliber. No aortic dissection. Heart is normal in size. No pericardial effusion. Mediastinum/Nodes: Small mediastinal lymph nodes not enlarged by size criteria. No hilar adenopathy. Decompressed esophagus. No focal thyroid nodule. Lungs/Pleura: Bilateral heterogeneous primarily ground-glass opacities within both lungs with a peripheral predominant distribution. Mild central bronchial thickening. No evidence of pulmonary edema. No pleural fluid. Upper Abdomen: Hepatic steatosis. No acute findings. Musculoskeletal: There are no acute or suspicious osseous abnormalities. Mild broad-based rightward scoliotic curvature of spine. Review of the MIP images confirms the above findings. IMPRESSION: 1. No pulmonary embolus. 2. Heterogeneous ground-glass opacities within both lungs with a peripheral predominant distribution. Pattern most consistent with COVID-19 pneumonia. 3. Incidental hepatic steatosis in the upper abdomen. Electronically Signed   By: Keith Rake M.D.   On: 07/25/2019 23:05   DG Chest Port 1 View  Result Date: 07/25/2019 CLINICAL DATA:  Shortness of breath EXAM: PORTABLE CHEST 1  VIEW COMPARISON:  12/01/2017 FINDINGS: The heart size and mediastinal contours are within normal limits. Heterogeneous airspace opacity of the bilateral lung bases. The visualized skeletal structures are unremarkable. IMPRESSION: Heterogeneous airspace opacity of the bilateral lung bases, consistent with infection. Electronically Signed   By: Eddie Candle M.D.   On: 07/25/2019 20:36     Scheduled Meds: . aspirin EC  81 mg Oral Daily  . cholecalciferol  5,000 Units Oral BID  . dexamethasone (DECADRON) injection  6 mg Intravenous Q24H  . enoxaparin (LOVENOX) injection  40 mg Subcutaneous Q24H  . fenofibrate  160 mg Oral Daily  . insulin aspart  0-15 Units Subcutaneous TID WC  . levothyroxine  125 mcg Oral Q0600  . magnesium oxide  200 mg Oral Daily  . montelukast  10 mg Oral Daily  . pantoprazole  40 mg Oral Daily  . pravastatin  20 mg Oral QHS  . pregabalin  75 mg Oral TID  . traZODone  75-150 mg Oral QHS   Continuous Infusions: . remdesivir 100 mg in NS 100 mL Stopped (07/27/19 1020)     LOS: 2 days    Time spent: 25 minutes spent in the coordination of care today.    Jonnie Finner, DO [facility] Pager [phone]  If 7PM-7AM, please contact night-coverage [email or web address] Password [email or web address] 07/27/2019, 2:05 PM

## 2019-07-27 NOTE — Progress Notes (Signed)
Caitlin Barnett [id number] Admission Data: 07/27/2019 4:21 PM Attending Provider: Jonnie Finner, DO  NBV:APOLIDC, Gwyndolyn Saxon, MD  [name] is a 53 y.o. female patient admitted from ED awake, alert  & orientated  X 3,  Full Code, VSS - Blood pressure 102/66, pulse 83, temperature 97.6 F (36.4 C), temperature source Oral, resp. rate (!) 24, height 5\' 2"  (1.575 m), weight 76.2 kg, SpO2 94 %., on 2L [place]. No distress noted. Bedside monitor placed and pt is currently running:normal sinus rhythm.  Pt orientation to unit, room and routine. Information packet given to patient/family.  Admission INP armband ID verified with patient/family, and in place. SR up x 2, fall risk assessment complete with patient and family verbalizing understanding of risks associated with falls. Pt verbalizes an understanding of how to use the call bell and to call for help before getting out of bed.  Skin, clean-dry- intact without evidence of bruising, or skin tears.   No evidence of skin break down noted on exam.  Will continue to monitor and assist as needed.  Hiram Comber, RN  07/27/2019 14:44 PM

## 2019-07-27 NOTE — ED Notes (Signed)
Lunch Tray Ordered @ 1126.  

## 2019-07-28 LAB — CBC WITH DIFFERENTIAL/PLATELET
Abs Immature Granulocytes: 0.09 10*3/uL — ABNORMAL HIGH (ref 0.00–0.07)
Basophils Absolute: 0 10*3/uL (ref 0.0–0.1)
Basophils Relative: 0 %
Eosinophils Absolute: 0 10*3/uL (ref 0.0–0.5)
Eosinophils Relative: 0 %
HCT: 42.7 % (ref 36.0–46.0)
Hemoglobin: 14.6 g/dL (ref 12.0–15.0)
Immature Granulocytes: 2 %
Lymphocytes Relative: 29 %
Lymphs Abs: 1.5 10*3/uL (ref 0.7–4.0)
MCH: 31 pg (ref 26.0–34.0)
MCHC: 34.2 g/dL (ref 30.0–36.0)
MCV: 90.7 fL (ref 80.0–100.0)
Monocytes Absolute: 0.4 10*3/uL (ref 0.1–1.0)
Monocytes Relative: 9 %
Neutro Abs: 3 10*3/uL (ref 1.7–7.7)
Neutrophils Relative %: 60 %
Platelets: 229 10*3/uL (ref 150–400)
RBC: 4.71 MIL/uL (ref 3.87–5.11)
RDW: 12.1 % (ref 11.5–15.5)
WBC: 5 10*3/uL (ref 4.0–10.5)
nRBC: 0 % (ref 0.0–0.2)

## 2019-07-28 LAB — COMPREHENSIVE METABOLIC PANEL
ALT: 36 U/L (ref 0–44)
AST: 34 U/L (ref 15–41)
Albumin: 3 g/dL — ABNORMAL LOW (ref 3.5–5.0)
Alkaline Phosphatase: 68 U/L (ref 38–126)
Anion gap: 13 (ref 5–15)
BUN: 18 mg/dL (ref 6–20)
CO2: 25 mmol/L (ref 22–32)
Calcium: 9 mg/dL (ref 8.9–10.3)
Chloride: 99 mmol/L (ref 98–111)
Creatinine, Ser: 0.46 mg/dL (ref 0.44–1.00)
GFR calc Af Amer: >60 mL/min (ref >60)
GFR calc non Af Amer: >60 mL/min (ref >60)
Glucose, Bld: 145 mg/dL — ABNORMAL HIGH (ref 70–99)
Potassium: 3.8 mmol/L (ref 3.5–5.1)
Sodium: 137 mmol/L (ref 135–145)
Total Bilirubin: 0.4 mg/dL (ref 0.3–1.2)
Total Protein: 6.2 g/dL — ABNORMAL LOW (ref 6.5–8.1)

## 2019-07-28 LAB — C-REACTIVE PROTEIN: CRP: 9.3 mg/dL — ABNORMAL HIGH (ref <1.0)

## 2019-07-28 LAB — MRSA PCR SCREENING: MRSA by PCR: NEGATIVE

## 2019-07-28 LAB — D-DIMER, QUANTITATIVE: D-Dimer, Quant: 0.39 ug/mL-FEU (ref 0.00–0.50)

## 2019-07-28 LAB — GLUCOSE, CAPILLARY
Glucose-Capillary: 144 mg/dL — ABNORMAL HIGH (ref 70–99)
Glucose-Capillary: 163 mg/dL — ABNORMAL HIGH (ref 70–99)
Glucose-Capillary: 264 mg/dL — ABNORMAL HIGH (ref 70–99)
Glucose-Capillary: 265 mg/dL — ABNORMAL HIGH (ref 70–99)

## 2019-07-28 LAB — MAGNESIUM: Magnesium: 1.9 mg/dL (ref 1.7–2.4)

## 2019-07-28 LAB — PHOSPHORUS: Phosphorus: 2.8 mg/dL (ref 2.5–4.6)

## 2019-07-28 LAB — FERRITIN: Ferritin: 150 ng/mL (ref 11–307)

## 2019-07-28 MED ORDER — FUROSEMIDE 20 MG PO TABS
20.0000 mg | ORAL_TABLET | Freq: Every day | ORAL | Status: DC
  Administered 2019-07-28 – 2019-07-30 (×3): 20 mg via ORAL
  Filled 2019-07-28 (×3): qty 1

## 2019-07-28 MED ORDER — ADULT MULTIVITAMIN W/MINERALS CH
1.0000 | ORAL_TABLET | Freq: Every day | ORAL | Status: DC
  Administered 2019-07-29 – 2019-08-01 (×4): 1 via ORAL
  Filled 2019-07-28 (×4): qty 1

## 2019-07-28 NOTE — Progress Notes (Signed)
Chaplain responded to consult for AD. Currently we are unable to complete Advance directives due to the logistics of getting witnesses and notary from room to room without spreading COVID-19 to other units. Chaplain will notify RN when we are able to complete the AD. Chaplain remains available for support as needs arise.   Chaplain Resident, Evelene Croon, M Div

## 2019-07-28 NOTE — Progress Notes (Signed)
Caitlin Barnett  PROGRESS NOTE    Caitlin Barnett  ALP:[id number] DOB: 1965/11/29 DOA: 07/25/2019 PCP: [name], MD   Brief Narrative:   53 y.o. WF diabetes mellitus type 2, hyperlipidemia, hypothyroidism, asthma presents to the ER because of worsening shortness of breath.   diagnosed with COVID-19 pneumonia about 14 days ago.  Patient was rechecked again 3 days ago when she was positive.  Over the last 4 days patient has been feeling very short of breath with right-sided chest pain pleuritic in nature.  As nonproductive cough severe weakness fatigue and taste loss.  Has been having subjective feeling of fever chills no nausea vomiting or diarrhea.  Sub feels weak tired didn't sleep well overnight\ Some mild CP worse with cough No feve rno chills   Assessment & Plan:   Principal Problem:   Acute respiratory failure due to COVID-19 [facility]) Active Problems:   Hypothyroidism   Type 2 diabetes mellitus (HCC)   Claudication in peripheral vascular disease (HCC)   Labile hypertension   Acute respiratory failure with hypoxia (HCC)  Acute respiratory failure with hypoxia COVID-19 pneumonia Right side pleurtic pain     - remdesivir till 12/19, decadron tll 12/24     - follow inflammatory markers     - PRN albuterol, antitussives     - wean O2 as able  Diabetes mellitus type 2     - moderate dose sliding scale; CBG 144-163  Hypothyroidism     - continue synthroid  Hypertriglyceridemia     - continue pravastatin  History of asthma     - see above  Mild hypercalcemia      - likely from dehydration     - resolved  History of diastolic dysfunction grade 1 per 2D echo done last year     - home lasix were held     - can resume in AM  DVT prophylaxis: lovenox Code Status: FULL   Disposition Plan: TBD  Antimicrobials:  . remdesivir   ROS:  Denies CP, N, V, ab pain. Reports some improving dyspnea . Remainder 10-pt ROS is negative for all not previously  mentioned.  Subjective: Looked a little weak and tired this am overall feels fair now No fever no chills still has some central CP   Objective: Vitals:   07/28/19 0000 07/28/19 0339 07/28/19 0418 07/28/19 1407  BP: 92/63 125/79 111/83 94/64  Pulse: 68 79 74 68  Resp: 17 (!) 23 20 20   Temp:   (!) 97.5 F (36.4 C) (!) 97.5 F (36.4 C)  TempSrc:   Oral Oral  SpO2: 93% (!) 86% 94% 98%  Weight:      Height:        Intake/Output Summary (Last 24 hours) at 07/28/2019 1553 Last data filed at 07/28/2019 1500 Gross per 24 hour  Intake 480 ml  Output --  Net 480 ml   Filed Weights   07/25/19 1845  Weight: 76.2 kg    Examination:  General: 53 y.o. female resting in bed in NAD Cardiovascular: RRR, +S1, S2, no m/g/r Respiratory: CTABL, no w/r/r, normal WOB GI: BS+, NDNT, softs MSK: No e/c/c Neuro: A&O x 3, no focal deficits Psyc: calm/cooperative   Data Reviewed: I have personally reviewed following labs and imaging studies.  CBC: Recent Labs  Lab 07/25/19 1944 07/26/19 0048 07/26/19 0435 07/27/19 0934 07/28/19 0440  WBC 9.2 9.9 7.5 4.9 5.0  NEUTROABS 7.4  --  6.0 3.2 3.0  HGB 15.5* 14.2 14.0 14.0 14.6  HCT 46.1* 42.5 42.3 43.2 42.7  MCV 92.4 93.0 93.2 94.9 90.7  PLT 263 246 221 211 350   Basic Metabolic Panel: Recent Labs  Lab 07/25/19 1944 07/26/19 0048 07/26/19 0435 07/27/19 0934 07/28/19 0440  NA 137  --  137 136 137  K 4.2  --  3.9 3.7 3.8  CL 99  --  102 102 99  CO2 23  --  22 24 25   GLUCOSE 196*  --  177* 163* 145*  BUN 21*  --  17 21* 18  CREATININE 0.53 0.51 0.47 0.63 0.46  CALCIUM 10.4*  --  8.6* 8.3* 9.0  MG  --   --   --  1.9 1.9  PHOS  --   --   --  3.2 2.8   GFR: Estimated Creatinine Clearance: 78.6 mL/min (by C-G formula based on SCr of 0.46 mg/dL). Liver Function Tests: Recent Labs  Lab 07/25/19 1944 07/26/19 0435 07/27/19 0934 07/28/19 0440  AST 28 28 47* 34  ALT 38 33 31 36  ALKPHOS 93 79 62 68  BILITOT 0.5 0.5 0.9 0.4   PROT 6.9 6.2* 5.9* 6.2*  ALBUMIN 3.6 3.1* 2.8* 3.0*   No results for input(s): LIPASE, AMYLASE in the last 168 hours. No results for input(s): AMMONIA in the last 168 hours. Coagulation Profile: No results for input(s): INR, PROTIME in the last 168 hours. Cardiac Enzymes: No results for input(s): CKTOTAL, CKMB, CKMBINDEX, TROPONINI in the last 168 hours. BNP (last 3 results) No results for input(s): PROBNP in the last 8760 hours. HbA1C: No results for input(s): HGBA1C in the last 72 hours. CBG: Recent Labs  Lab 07/27/19 1156 07/27/19 1727 07/27/19 2044 07/28/19 0802 07/28/19 1216  GLUCAP 148* 214* 202* 144* 163*   Lipid Profile: Recent Labs    07/25/19 1944  TRIG 512*   Thyroid Function Tests: No results for input(s): TSH, T4TOTAL, FREET4, T3FREE, THYROIDAB in the last 72 hours. Anemia Panel: Recent Labs    07/27/19 0934 07/28/19 0440  FERRITIN 159 150   Sepsis Labs: Recent Labs  Lab 07/25/19 1944 07/25/19 2110 07/26/19 0436  PROCALCITON <0.10  --   --   LATICACIDVEN 3.1* 2.1* 1.4    Recent Results (from the past 240 hour(s))  Blood Culture (routine x 2)     Status: None (Preliminary result)   Collection Time: 07/25/19  7:44 PM   Specimen: BLOOD  Result Value Ref Range Status   Specimen Description BLOOD RIGHT ANTECUBITAL  Final   Special Requests   Final    BOTTLES DRAWN AEROBIC AND ANAEROBIC Blood Culture results may not be optimal due to an inadequate volume of blood received in culture bottles   Culture   Final    NO GROWTH 3 DAYS Performed at [facility], [place] [address], [place], [place] [address]    Report Status PENDING  Incomplete  Blood Culture (routine x 2)     Status: None (Preliminary result)   Collection Time: 07/25/19  8:22 PM   Specimen: BLOOD  Result Value Ref Range Status   Specimen Description BLOOD LEFT WRIST  Final   Special Requests   Final    BOTTLES DRAWN AEROBIC AND ANAEROBIC Blood Culture results may not be optimal  due to an inadequate volume of blood received in culture bottles   Culture   Final    NO GROWTH 3 DAYS Performed at [facility], [place] [address] [place], [place] [address]  Report Status PENDING  Incomplete  MRSA PCR Screening     Status: None   Collection Time: 07/28/19 12:33 PM   Specimen: Nasopharyngeal  Result Value Ref Range Status   MRSA by PCR NEGATIVE NEGATIVE Final    Comment:        The GeneXpert MRSA Assay (FDA approved for NASAL specimens only), is one component of a comprehensive MRSA colonization surveillance program. It is not intended to diagnose MRSA infection nor to guide or monitor treatment for MRSA infections. Performed at [facility], [address] [place], [place] [address]       Radiology Studies: No results found.   Scheduled Meds: . aspirin EC  81 mg Oral Daily  . cholecalciferol  5,000 Units Oral BID  . dexamethasone (DECADRON) injection  6 mg Intravenous Q24H  . enoxaparin (LOVENOX) injection  40 mg Subcutaneous Q24H  . feeding supplement (ENSURE ENLIVE)  237 mL Oral BID BM  . fenofibrate  160 mg Oral Daily  . furosemide  20 mg Oral Daily  . insulin aspart  0-15 Units Subcutaneous TID WC  . levothyroxine  125 mcg Oral Q0600  . magnesium oxide  200 mg Oral Daily  . montelukast  10 mg Oral Daily  . [START ON 07/29/2019] multivitamin with minerals  1 tablet Oral Daily  . pantoprazole  40 mg Oral Daily  . pravastatin  20 mg Oral QHS  . pregabalin  75 mg Oral TID  . traZODone  75-150 mg Oral QHS   Continuous Infusions: . remdesivir 100 mg in NS 100 mL 100 mg (07/28/19 0947)     LOS: 3 days    Time spent: 25 minutes spent in the coordination of care today.    Pleas KochJai Comer Devins, MD [facility] 3:57 PM  If 7PM-7AM, please contact night-coverage [email or web address] Password [email or web address] 07/28/2019, 3:53 PM

## 2019-07-28 NOTE — Progress Notes (Signed)
Patient tried pronning for the first time tonight 12/15 at 2208 and was able to tolerate 5 hours in this position.

## 2019-07-28 NOTE — Progress Notes (Signed)
Initial Nutrition Assessment  RD working remotely.  DOCUMENTATION CODES:   Obesity unspecified  INTERVENTION:   - Recommend liberalizing diet to REGULAR to promote PO intake  - Continue Ensure Enlive po BID, each supplement provides 350 kcal and 20 grams of protein  - MVI with minerals daily  - Encourage adequate PO intake  NUTRITION DIAGNOSIS:   Increased nutrient needs related to acute illness (COVID-19) as evidenced by estimated needs.  GOAL:   Patient will meet greater than or equal to 90% of their needs  MONITOR:   PO intake, Supplement acceptance, Labs, Weight trends  REASON FOR ASSESSMENT:   Malnutrition Screening Tool    ASSESSMENT:   53 year old female who presented to the ED on 12/13 with SOB and COVID-19 positive. PMH of T2DM, diverticulitis, GERD, HLD, IBS.   Unable to reach pt via phone call to room.  No meal completions charted since admission. Per MAR, pt has accepted all Ensure Enlive supplements offered. Will continue with current supplement regimen. RD will also order daily MVI with minerals.  Recommend liberalizing diet to Regular to promote PO intake during admission.  Weight on admission appears stated rather than measured. If accurate, pt has experienced a 4.4 kg weight loss since 05/11/19. This is a 5.5% weight loss in less than 3 months which is not significant for timeframe but is concerning given acute illness.  Noted pt is self-proning.  Medications reviewed and include: cholecalciferol, decadron, Ensure Enlive BID, fenofibrate, SSI, magnesium oxide, protonix, remdesivir  Labs reviewed: 144-214 x 24 hours  NUTRITION - FOCUSED PHYSICAL EXAM:  Unable to complete at this time. RD working remotely.  Diet Order:   Diet Order            Diet heart healthy/carb modified Room service appropriate? Yes; Fluid consistency: Thin  Diet effective now              EDUCATION NEEDS:   No education needs have been identified at this  time  Skin:  Skin Assessment: Reviewed RN Assessment  Last BM:  07/27/19  Height:   Ht Readings from Last 1 Encounters:  07/25/19 5\' 2"  (1.575 m)    Weight:   Wt Readings from Last 1 Encounters:  07/25/19 76.2 kg    Ideal Body Weight:  50 kg  BMI:  Body mass index is 30.73 kg/m.  Estimated Nutritional Needs:   Kcal:  1800-2000  Protein:  85-100 grams  Fluid:  >/= 1.8 L    Gaynell Face, MS, RD, LDN Inpatient Clinical Dietitian Pager: [phone] Weekend/After Hours: [phone]

## 2019-07-29 ENCOUNTER — Inpatient Hospital Stay (HOSPITAL_COMMUNITY): Payer: Managed Care, Other (non HMO)

## 2019-07-29 LAB — COMPREHENSIVE METABOLIC PANEL
ALT: 35 U/L (ref 0–44)
AST: 28 U/L (ref 15–41)
Albumin: 3 g/dL — ABNORMAL LOW (ref 3.5–5.0)
Alkaline Phosphatase: 77 U/L (ref 38–126)
Anion gap: 12 (ref 5–15)
BUN: 19 mg/dL (ref 6–20)
CO2: 27 mmol/L (ref 22–32)
Calcium: 9.1 mg/dL (ref 8.9–10.3)
Chloride: 98 mmol/L (ref 98–111)
Creatinine, Ser: 0.48 mg/dL (ref 0.44–1.00)
GFR calc Af Amer: >60 mL/min (ref >60)
GFR calc non Af Amer: >60 mL/min (ref >60)
Glucose, Bld: 225 mg/dL — ABNORMAL HIGH (ref 70–99)
Potassium: 3.6 mmol/L (ref 3.5–5.1)
Sodium: 137 mmol/L (ref 135–145)
Total Bilirubin: 0.4 mg/dL (ref 0.3–1.2)
Total Protein: 6.1 g/dL — ABNORMAL LOW (ref 6.5–8.1)

## 2019-07-29 LAB — CBC WITH DIFFERENTIAL/PLATELET
Abs Immature Granulocytes: 0 10*3/uL (ref 0.00–0.07)
Basophils Absolute: 0 10*3/uL (ref 0.0–0.1)
Basophils Relative: 0 %
Eosinophils Absolute: 0 10*3/uL (ref 0.0–0.5)
Eosinophils Relative: 0 %
HCT: 41.7 % (ref 36.0–46.0)
Hemoglobin: 14.1 g/dL (ref 12.0–15.0)
Lymphocytes Relative: 17 %
Lymphs Abs: 1.1 10*3/uL (ref 0.7–4.0)
MCH: 30.9 pg (ref 26.0–34.0)
MCHC: 33.8 g/dL (ref 30.0–36.0)
MCV: 91.4 fL (ref 80.0–100.0)
Monocytes Absolute: 0.3 10*3/uL (ref 0.1–1.0)
Monocytes Relative: 5 %
Neutro Abs: 4.8 10*3/uL (ref 1.7–7.7)
Neutrophils Relative %: 78 %
Platelets: 264 10*3/uL (ref 150–400)
RBC: 4.56 MIL/uL (ref 3.87–5.11)
RDW: 12 % (ref 11.5–15.5)
WBC: 6.2 10*3/uL (ref 4.0–10.5)
nRBC: 0 % (ref 0.0–0.2)
nRBC: 0 /100 WBC

## 2019-07-29 LAB — PHOSPHORUS: Phosphorus: 2.7 mg/dL (ref 2.5–4.6)

## 2019-07-29 LAB — FERRITIN: Ferritin: 134 ng/mL (ref 11–307)

## 2019-07-29 LAB — GLUCOSE, CAPILLARY
Glucose-Capillary: 167 mg/dL — ABNORMAL HIGH (ref 70–99)
Glucose-Capillary: 262 mg/dL — ABNORMAL HIGH (ref 70–99)
Glucose-Capillary: 285 mg/dL — ABNORMAL HIGH (ref 70–99)
Glucose-Capillary: 430 mg/dL — ABNORMAL HIGH (ref 70–99)

## 2019-07-29 LAB — MAGNESIUM: Magnesium: 2 mg/dL (ref 1.7–2.4)

## 2019-07-29 LAB — C-REACTIVE PROTEIN: CRP: 4.7 mg/dL — ABNORMAL HIGH (ref <1.0)

## 2019-07-29 LAB — D-DIMER, QUANTITATIVE: D-Dimer, Quant: <0.27 ug/mL-FEU (ref 0.00–0.50)

## 2019-07-29 MED ORDER — FUROSEMIDE 10 MG/ML IJ SOLN
40.0000 mg | Freq: Once | INTRAMUSCULAR | Status: AC
  Administered 2019-07-29: 40 mg via INTRAVENOUS
  Filled 2019-07-29: qty 4

## 2019-07-29 MED ORDER — DEXAMETHASONE SODIUM PHOSPHATE 10 MG/ML IJ SOLN
6.0000 mg | Freq: Two times a day (BID) | INTRAMUSCULAR | Status: DC
  Administered 2019-07-29 – 2019-08-01 (×6): 6 mg via INTRAVENOUS
  Filled 2019-07-29 (×6): qty 1

## 2019-07-29 NOTE — Progress Notes (Signed)
Marland Kitchen.  PROGRESS NOTE    Caitlin CheekSusan Barnett  UYQ:[id number] DOB: [birth year] DOA: 07/25/2019 PCP: Lucky CowboyMcKeown, William, MD   Brief Narrative:   53 y.o. WF diabetes mellitus type 2, hyperlipidemia, hypothyroidism, asthma presents to the ER because of worsening shortness of breath.   diagnosed with COVID-19 pneumonia about 14 days ago.  Patient was rechecked again 3 days ago when she was positive.  Over the last 4 days patient has been feeling very short of breath with right-sided chest pain pleuritic in nature.  As nonproductive cough severe weakness fatigue and taste loss.  Has been having subjective feeling of fever chills no nausea vomiting or diarrhea.  Assessment & Plan:   Principal Problem:   Acute respiratory failure due to COVID-19 [facility](HCC) Active Problems:   Hypothyroidism   Type 2 diabetes mellitus (HCC)   Claudication in peripheral vascular disease (HCC)   Labile hypertension   Acute respiratory failure with hypoxia (HCC)  Acute respiratory failure with hypoxia COVID-19 pneumonia Right side pleurtic pain     - remdesivir till 12/19, decadron tll 12/24     - inflamm markers are trending downward overall     - PRN albuterol, antitussives---given a dose of IV lasix earlier today     - wean O2 as able  Diabetes mellitus type 2     - moderate dose sliding scale; CBG 167-262-possibly will add Lantus in am if continues to be above 200  Hypothyroidism     - continue synthroid 125 mcg daily  Hypertriglyceridemia     - continue pravastatin  History of asthma     - see above  Mild hypercalcemia      - likely from dehydration     - resolved  History of diastolic dysfunction grade 1 per 2D echo done last year     - home lasix were held     - can resume in AM  Updated husband in detail on phone 12/17 DVT prophylaxis: lovenox Code Status: FULL   Disposition Plan: TBD  Antimicrobials:  . remdesivir    Subjective:  Some SOB overall has done better for the better part of the day no  n/v Still some CP and pleurisy  Objective: Vitals:   07/29/19 0510 07/29/19 0900 07/29/19 1309 07/29/19 1500  BP: 105/74  100/70   Pulse: 76 79 69 81  Resp:  16 16 20   Temp: 98.4 F (36.9 C)  97.7 F (36.5 C)   TempSrc: Oral  Oral   SpO2: 94% 91% 98% 97%  Weight:      Height:       No intake or output data in the 24 hours ending 07/29/19 1603 Filed Weights   07/25/19 1845  Weight: 76.2 kg    Examination:  General: 53 y.o. female resting in bed in NAD Cardiovascular: RRR, +S1, S2, no m/g/r Respiratory: CTABL, no w/r/r, slight increased WOB with some crackles GI: BS+, NDNT, softs MSK: No e/c/c Neuro: A&O x 3, no focal deficits Psyc: calm/cooperative   Data Reviewed: I have personally reviewed following labs and imaging studies.  CBC: Recent Labs  Lab 07/25/19 1944 07/26/19 0048 07/26/19 0435 07/27/19 0934 07/28/19 0440 07/29/19 0500  WBC 9.2 9.9 7.5 4.9 5.0 6.2  NEUTROABS 7.4  --  6.0 3.2 3.0 4.8  HGB 15.5* 14.2 14.0 14.0 14.6 14.1  HCT 46.1* 42.5 42.3 43.2 42.7 41.7  MCV 92.4 93.0 93.2 94.9 90.7 91.4  PLT 263 246 221 211 229 264   Basic Metabolic Panel:  Recent Labs  Lab 07/25/19 1944 07/26/19 0048 07/26/19 0435 07/27/19 0934 07/28/19 0440 07/29/19 0500  NA 137  --  137 136 137 137  K 4.2  --  3.9 3.7 3.8 3.6  CL 99  --  102 102 99 98  CO2 23  --  22 24 25 27   GLUCOSE 196*  --  177* 163* 145* 225*  BUN 21*  --  17 21* 18 19  CREATININE 0.53 0.51 0.47 0.63 0.46 0.48  CALCIUM 10.4*  --  8.6* 8.3* 9.0 9.1  MG  --   --   --  1.9 1.9 2.0  PHOS  --   --   --  3.2 2.8 2.7   GFR: Estimated Creatinine Clearance: 78.6 mL/min (by C-G formula based on SCr of 0.48 mg/dL). Liver Function Tests: Recent Labs  Lab 07/25/19 1944 07/26/19 0435 07/27/19 0934 07/28/19 0440 07/29/19 0500  AST 28 28 47* 34 28  ALT 38 33 31 36 35  ALKPHOS 93 79 62 68 77  BILITOT 0.5 0.5 0.9 0.4 0.4  PROT 6.9 6.2* 5.9* 6.2* 6.1*  ALBUMIN 3.6 3.1* 2.8* 3.0* 3.0*   No  results for input(s): LIPASE, AMYLASE in the last 168 hours. No results for input(s): AMMONIA in the last 168 hours. Coagulation Profile: No results for input(s): INR, PROTIME in the last 168 hours. Cardiac Enzymes: No results for input(s): CKTOTAL, CKMB, CKMBINDEX, TROPONINI in the last 168 hours. BNP (last 3 results) No results for input(s): PROBNP in the last 8760 hours. HbA1C: No results for input(s): HGBA1C in the last 72 hours. CBG: Recent Labs  Lab 07/28/19 1216 07/28/19 1713 07/28/19 2150 07/29/19 0817 07/29/19 1233  GLUCAP 163* 264* 265* 167* 262*   Lipid Profile: No results for input(s): CHOL, HDL, LDLCALC, TRIG, CHOLHDL, LDLDIRECT in the last 72 hours. Thyroid Function Tests: No results for input(s): TSH, T4TOTAL, FREET4, T3FREE, THYROIDAB in the last 72 hours. Anemia Panel: Recent Labs    07/28/19 0440 07/29/19 0500  FERRITIN 150 134   Sepsis Labs: Recent Labs  Lab 07/25/19 1944 07/25/19 2110 07/26/19 0436  PROCALCITON <0.10  --   --   LATICACIDVEN 3.1* 2.1* 1.4    Recent Results (from the past 240 hour(s))  Blood Culture (routine x 2)     Status: None (Preliminary result)   Collection Time: 07/25/19  7:44 PM   Specimen: BLOOD  Result Value Ref Range Status   Specimen Description BLOOD RIGHT ANTECUBITAL  Final   Special Requests   Final    BOTTLES DRAWN AEROBIC AND ANAEROBIC Blood Culture results may not be optimal due to an inadequate volume of blood received in culture bottles   Culture   Final    NO GROWTH 4 DAYS Performed at [facility] [address] [place], [place] [address]    Report Status PENDING  Incomplete  Blood Culture (routine x 2)     Status: None (Preliminary result)   Collection Time: 07/25/19  8:22 PM   Specimen: BLOOD  Result Value Ref Range Status   Specimen Description BLOOD LEFT WRIST  Final   Special Requests   Final    BOTTLES DRAWN AEROBIC AND ANAEROBIC Blood Culture results may not be optimal due to an  inadequate volume of blood received in culture bottles   Culture   Final    NO GROWTH 4 DAYS Performed at [facility], [place] [address] [place], [place] [address]    Report Status  PENDING  Incomplete  MRSA PCR Screening     Status: None   Collection Time: 07/28/19 12:33 PM   Specimen: Nasopharyngeal  Result Value Ref Range Status   MRSA by PCR NEGATIVE NEGATIVE Final    Comment:        The GeneXpert MRSA Assay (FDA approved for NASAL specimens only), is one component of a comprehensive MRSA colonization surveillance program. It is not intended to diagnose MRSA infection nor to guide or monitor treatment for MRSA infections. Performed at [facility], [address], [place], [place] [address]       Radiology Studies: DG CHEST PORT 1 VIEW  Result Date: 07/29/2019 CLINICAL DATA:  Shortness of breath in a COVID-19 positive patient. EXAM: PORTABLE CHEST 1 VIEW COMPARISON:  Single-view of the chest and CT of the chest 07/25/2019. FINDINGS: Patchy bilateral airspace disease consistent with pneumonia has worsened since the comparison exams. No pneumothorax or pleural effusion. Heart size is normal. Thoracolumbar scoliosis noted. IMPRESSION: Worsened appearance of bilateral pneumonia. Electronically Signed   By: Drusilla Kanner M.D.   On: 07/29/2019 09:59     Scheduled Meds: . aspirin EC  81 mg Oral Daily  . cholecalciferol  5,000 Units Oral BID  . dexamethasone (DECADRON) injection  6 mg Intravenous Q12H  . enoxaparin (LOVENOX) injection  40 mg Subcutaneous Q24H  . feeding supplement (ENSURE ENLIVE)  237 mL Oral BID BM  . fenofibrate  160 mg Oral Daily  . furosemide  20 mg Oral Daily  . insulin aspart  0-15 Units Subcutaneous TID WC  . levothyroxine  125 mcg Oral Q0600  . magnesium oxide  200 mg Oral Daily  . montelukast  10 mg Oral Daily  . multivitamin with minerals  1 tablet Oral Daily  . pantoprazole  40 mg Oral Daily  . pravastatin  20 mg Oral QHS  .  pregabalin  75 mg Oral TID  . traZODone  75-150 mg Oral QHS   Continuous Infusions: . remdesivir 100 mg in NS 100 mL 100 mg (07/29/19 0906)     LOS: 4 days    Time spent: 35 minutes spent in the coordination of care today.    Pleas Koch, MD [facility] 4:03 PM  If 7PM-7AM, please contact night-coverage [email or web address] Password [email or web address] 07/29/2019, 4:03 PM

## 2019-07-29 NOTE — Progress Notes (Signed)
Pt CBG 430, NP paged

## 2019-07-30 LAB — FERRITIN: Ferritin: 118 ng/mL (ref 11–307)

## 2019-07-30 LAB — CULTURE, BLOOD (ROUTINE X 2)
Culture: NO GROWTH
Culture: NO GROWTH

## 2019-07-30 LAB — CBC WITH DIFFERENTIAL/PLATELET
Abs Immature Granulocytes: 0.08 10*3/uL — ABNORMAL HIGH (ref 0.00–0.07)
Basophils Absolute: 0 10*3/uL (ref 0.0–0.1)
Basophils Relative: 0 %
Eosinophils Absolute: 0 10*3/uL (ref 0.0–0.5)
Eosinophils Relative: 0 %
HCT: 42.3 % (ref 36.0–46.0)
Hemoglobin: 14.1 g/dL (ref 12.0–15.0)
Immature Granulocytes: 1 %
Lymphocytes Relative: 17 %
Lymphs Abs: 1.1 10*3/uL (ref 0.7–4.0)
MCH: 31.1 pg (ref 26.0–34.0)
MCHC: 33.3 g/dL (ref 30.0–36.0)
MCV: 93.2 fL (ref 80.0–100.0)
Monocytes Absolute: 0.3 10*3/uL (ref 0.1–1.0)
Monocytes Relative: 5 %
Neutro Abs: 5 10*3/uL (ref 1.7–7.7)
Neutrophils Relative %: 77 %
Platelets: 305 10*3/uL (ref 150–400)
RBC: 4.54 MIL/uL (ref 3.87–5.11)
RDW: 12.1 % (ref 11.5–15.5)
WBC: 6.6 10*3/uL (ref 4.0–10.5)
nRBC: 0 % (ref 0.0–0.2)

## 2019-07-30 LAB — COMPREHENSIVE METABOLIC PANEL
ALT: 41 U/L (ref 0–44)
AST: 32 U/L (ref 15–41)
Albumin: 3.1 g/dL — ABNORMAL LOW (ref 3.5–5.0)
Alkaline Phosphatase: 84 U/L (ref 38–126)
Anion gap: 11 (ref 5–15)
BUN: 21 mg/dL — ABNORMAL HIGH (ref 6–20)
CO2: 27 mmol/L (ref 22–32)
Calcium: 9.4 mg/dL (ref 8.9–10.3)
Chloride: 99 mmol/L (ref 98–111)
Creatinine, Ser: 0.51 mg/dL (ref 0.44–1.00)
GFR calc Af Amer: >60 mL/min (ref >60)
GFR calc non Af Amer: >60 mL/min (ref >60)
Glucose, Bld: 282 mg/dL — ABNORMAL HIGH (ref 70–99)
Potassium: 4.4 mmol/L (ref 3.5–5.1)
Sodium: 137 mmol/L (ref 135–145)
Total Bilirubin: 0.5 mg/dL (ref 0.3–1.2)
Total Protein: 6.2 g/dL — ABNORMAL LOW (ref 6.5–8.1)

## 2019-07-30 LAB — C-REACTIVE PROTEIN: CRP: 2.4 mg/dL — ABNORMAL HIGH (ref <1.0)

## 2019-07-30 LAB — MAGNESIUM: Magnesium: 2.2 mg/dL (ref 1.7–2.4)

## 2019-07-30 LAB — PHOSPHORUS: Phosphorus: 3.2 mg/dL (ref 2.5–4.6)

## 2019-07-30 LAB — GLUCOSE, CAPILLARY
Glucose-Capillary: 218 mg/dL — ABNORMAL HIGH (ref 70–99)
Glucose-Capillary: 325 mg/dL — ABNORMAL HIGH (ref 70–99)
Glucose-Capillary: 257 mg/dL — ABNORMAL HIGH (ref 70–99)
Glucose-Capillary: 290 mg/dL — ABNORMAL HIGH (ref 70–99)

## 2019-07-30 LAB — D-DIMER, QUANTITATIVE: D-Dimer, Quant: <0.27 ug/mL-FEU (ref 0.00–0.50)

## 2019-07-30 MED ORDER — POLYETHYLENE GLYCOL 3350 17 G PO PACK
17.0000 g | PACK | Freq: Every day | ORAL | Status: DC
  Administered 2019-07-30 – 2019-07-31 (×2): 17 g via ORAL
  Filled 2019-07-30 (×3): qty 1

## 2019-07-30 MED ORDER — FUROSEMIDE 40 MG PO TABS
40.0000 mg | ORAL_TABLET | Freq: Every day | ORAL | Status: DC
  Administered 2019-07-31 – 2019-08-01 (×2): 40 mg via ORAL
  Filled 2019-07-30 (×2): qty 1

## 2019-07-30 MED ORDER — INSULIN GLARGINE 100 UNIT/ML ~~LOC~~ SOLN
8.0000 [IU] | Freq: Every day | SUBCUTANEOUS | Status: DC
  Administered 2019-07-30: 8 [IU] via SUBCUTANEOUS
  Filled 2019-07-30 (×2): qty 0.08

## 2019-07-30 NOTE — Progress Notes (Signed)
Marland Kitchen.  PROGRESS NOTE    Caitlin CheekSusan Barnett  ZOX:[id number] DOB: 17-Sep-1965 DOA: 07/25/2019 PCP: Lucky CowboyMcKeown, William, MD   Brief Narrative:   53 y.o. WF diabetes mellitus type 2, hyperlipidemia, hypothyroidism, asthma presents to the ER because of worsening shortness of breath.   diagnosed with COVID-19 pneumonia about 14 days ago.  Patient was rechecked again 3 days ago when she was positive.  Over the last 4 days patient has been feeling very short of breath with right-sided chest pain pleuritic in nature.  As nonproductive cough severe weakness fatigue and taste loss.  Has been having subjective feeling of fever chills no nausea vomiting or diarrhea.  Assessment & Plan:   Principal Problem:   Acute respiratory failure due to COVID-19 [facility](HCC) Active Problems:   Hypothyroidism   Type 2 diabetes mellitus (HCC)   Claudication in peripheral vascular disease (HCC)   Labile hypertension   Acute respiratory failure with hypoxia (HCC)  Acute respiratory failure with hypoxia COVID-19 pneumonia Right side pleurtic pain     - remdesivir till 12/19, decadron tll 12/24     - inflamm markers improved slightly CRP 9-->2 Ferritin 159-->118 Dimer  0.27 and stable     - PRN albuterol, antitussives---given a dose of IV lasix earlier today--continue lasix 40 daily for now     - wean O2 as able  Diabetes mellitus type 2     - moderate dose sliding scale; CBG 218-400  add Lantus 8  -asking for regular diet--will change  Hypothyroidism     - continue synthroid 125 mcg daily  Hypertriglyceridemia     - continue pravastatin  History of asthma     - see above  Mild hypercalcemia      - likely from dehydration     - resolved  History of diastolic dysfunction grade 1 per 2D echo done last year     - home lasix were held     - can resume in AM  Updated husband in detail on phone 12/17 DVT prophylaxis: lovenox Code Status: FULL   Disposition Plan: TBD  Antimicrobials:  . remdesivir     Subjective:  Pleurisy better Feels winded however on sitting up and moving aorund No fever no dfiarr  Objective: Vitals:   07/29/19 1600 07/29/19 1915 07/29/19 2138 07/30/19 0509  BP: (!) 119/58 114/76 101/72 110/70  Pulse: 83 79 90 68  Resp: 19 19 20  (!) 23  Temp:   98.7 F (37.1 C) 98 F (36.7 C)  TempSrc:   Oral Oral  SpO2: 93% 93% 92% 95%  Weight:      Height:        Intake/Output Summary (Last 24 hours) at 07/30/2019 [id number] Last data filed at 07/29/2019 1703 Gross per 24 hour  Intake 840 ml  Output 1400 ml  Net -560 ml   Filed Weights   07/25/19 1845  Weight: 76.2 kg    Examination:  General: 53 y.o. female resting in bed in NAD Cardiovascular: RRR, +S1, S2, no m/g/r Respiratory: CTABL, no w/r/r,cleasr to exam no added sound GI: BS+, NDNT, soft MSK: No e/c/c Neuro: A&O x 3, no focal deficits Psyc: calm/cooperative   Data Reviewed: I have personally reviewed following labs and imaging studies.  CBC: Recent Labs  Lab 07/26/19 0435 07/27/19 0934 07/28/19 0440 07/29/19 0500 07/30/19 0500  WBC 7.5 4.9 5.0 6.2 6.6  NEUTROABS 6.0 3.2 3.0 4.8 5.0  HGB 14.0 14.0 14.6 14.1 14.1  HCT 42.3 43.2 42.7 41.7 42.3  MCV 93.2 94.9 90.7 91.4 93.2  PLT 221 211 229 264 250   Basic Metabolic Panel: Recent Labs  Lab 07/26/19 0435 07/27/19 0934 07/28/19 0440 07/29/19 0500 07/30/19 0500  NA 137 136 137 137 137  K 3.9 3.7 3.8 3.6 4.4  CL 102 102 99 98 99  CO2 22 24 25 27 27   GLUCOSE 177* 163* 145* 225* 282*  BUN 17 21* 18 19 21*  CREATININE 0.47 0.63 0.46 0.48 0.51  CALCIUM 8.6* 8.3* 9.0 9.1 9.4  MG  --  1.9 1.9 2.0 2.2  PHOS  --  3.2 2.8 2.7 3.2   GFR: Estimated Creatinine Clearance: 78.6 mL/min (by C-G formula based on SCr of 0.51 mg/dL). Liver Function Tests: Recent Labs  Lab 07/26/19 0435 07/27/19 0934 07/28/19 0440 07/29/19 0500 07/30/19 0500  AST 28 47* 34 28 32  ALT 33 31 36 35 41  ALKPHOS 79 62 68 77 84  BILITOT 0.5 0.9 0.4 0.4 0.5   PROT 6.2* 5.9* 6.2* 6.1* 6.2*  ALBUMIN 3.1* 2.8* 3.0* 3.0* 3.1*   No results for input(s): LIPASE, AMYLASE in the last 168 hours. No results for input(s): AMMONIA in the last 168 hours. Coagulation Profile: No results for input(s): INR, PROTIME in the last 168 hours. Cardiac Enzymes: No results for input(s): CKTOTAL, CKMB, CKMBINDEX, TROPONINI in the last 168 hours. BNP (last 3 results) No results for input(s): PROBNP in the last 8760 hours. HbA1C: No results for input(s): HGBA1C in the last 72 hours. CBG: Recent Labs  Lab 07/29/19 0817 07/29/19 1233 07/29/19 1706 07/29/19 2135 07/30/19 0747  GLUCAP 167* 262* 285* 430* 218*   Lipid Profile: No results for input(s): CHOL, HDL, LDLCALC, TRIG, CHOLHDL, LDLDIRECT in the last 72 hours. Thyroid Function Tests: No results for input(s): TSH, T4TOTAL, FREET4, T3FREE, THYROIDAB in the last 72 hours. Anemia Panel: Recent Labs    07/29/19 0500 07/30/19 0500  FERRITIN 134 118   Sepsis Labs: Recent Labs  Lab 07/25/19 1944 07/25/19 2110 07/26/19 0436  PROCALCITON <0.10  --   --   LATICACIDVEN 3.1* 2.1* 1.4    Recent Results (from the past 240 hour(s))  Blood Culture (routine x 2)     Status: None (Preliminary result)   Collection Time: 07/25/19  7:44 PM   Specimen: BLOOD  Result Value Ref Range Status   Specimen Description BLOOD RIGHT ANTECUBITAL  Final   Special Requests   Final    BOTTLES DRAWN AEROBIC AND ANAEROBIC Blood Culture results may not be optimal due to an inadequate volume of blood received in culture bottles   Culture   Final    NO GROWTH 4 DAYS Performed at [facility], [place] [address] [place], [place] [address]    Report Status PENDING  Incomplete  Blood Culture (routine x 2)     Status: None (Preliminary result)   Collection Time: 07/25/19  8:22 PM   Specimen: BLOOD  Result Value Ref Range Status   Specimen Description BLOOD LEFT WRIST  Final   Special Requests   Final    BOTTLES DRAWN  AEROBIC AND ANAEROBIC Blood Culture results may not be optimal due to an inadequate volume of blood received in culture bottles   Culture   Final    NO GROWTH 4 DAYS Performed at [facility], [place] [address], [place], [place] [address]    Report Status PENDING  Incomplete  MRSA PCR Screening     Status: None   Collection  Time: 07/28/19 12:33 PM   Specimen: Nasopharyngeal  Result Value Ref Range Status   MRSA by PCR NEGATIVE NEGATIVE Final    Comment:        The GeneXpert MRSA Assay (FDA approved for NASAL specimens only), is one component of a comprehensive MRSA colonization surveillance program. It is not intended to diagnose MRSA infection nor to guide or monitor treatment for MRSA infections. Performed at [facility], [address], [place], [place] [address]       Radiology Studies: DG CHEST PORT 1 VIEW  Result Date: 07/29/2019 CLINICAL DATA:  Shortness of breath in a COVID-19 positive patient. EXAM: PORTABLE CHEST 1 VIEW COMPARISON:  Single-view of the chest and CT of the chest 07/25/2019. FINDINGS: Patchy bilateral airspace disease consistent with pneumonia has worsened since the comparison exams. No pneumothorax or pleural effusion. Heart size is normal. Thoracolumbar scoliosis noted. IMPRESSION: Worsened appearance of bilateral pneumonia. Electronically Signed   By: Drusilla Kanner M.D.   On: 07/29/2019 09:59     Scheduled Meds: . aspirin EC  81 mg Oral Daily  . cholecalciferol  5,000 Units Oral BID  . dexamethasone (DECADRON) injection  6 mg Intravenous Q12H  . enoxaparin (LOVENOX) injection  40 mg Subcutaneous Q24H  . feeding supplement (ENSURE ENLIVE)  237 mL Oral BID BM  . fenofibrate  160 mg Oral Daily  . furosemide  20 mg Oral Daily  . insulin aspart  0-15 Units Subcutaneous TID WC  . levothyroxine  125 mcg Oral Q0600  . magnesium oxide  200 mg Oral Daily  . montelukast  10 mg Oral Daily  . multivitamin with minerals  1 tablet Oral Daily  .  pantoprazole  40 mg Oral Daily  . pravastatin  20 mg Oral QHS  . pregabalin  75 mg Oral TID  . traZODone  75-150 mg Oral QHS   Continuous Infusions: . remdesivir 100 mg in NS 100 mL Stopped (07/29/19 1000)     LOS: 5 days    Time spent: 25 minutes spent in the coordination of care today.    Pleas Koch, MD [facility] 8:08 AM  If 7PM-7AM, please contact night-coverage [email or web address] Password [email or web address] 07/30/2019, 8:08 AM

## 2019-07-30 NOTE — Progress Notes (Addendum)
Inpatient Diabetes Program Recommendations  AACE/ADA: New Consensus Statement on Inpatient Glycemic Control (2015)  Target Ranges:  Prepandial:   less than 140 mg/dL      Peak postprandial:   less than 180 mg/dL (1-2 hours)      Critically ill patients:  140 - 180 mg/dL   Lab Results  Component Value Date   GLUCAP 325 (H) 07/30/2019   HGBA1C 6.7 (H) 08/20/2018    Review of Glycemic Control Results for Caitlin Barnett, Caitlin Barnett (MRN [id number]) as of 07/30/2019 13:21  Ref. Range 07/29/2019 12:33 07/29/2019 17:06 07/29/2019 21:35 07/30/2019 07:47 07/30/2019 13:08  Glucose-Capillary Latest Ref Range: 70 - 99 mg/dL 262 (H) 285 (H) 430 (H) 218 (H) 325 (H)   Diabetes history: DM 2 Outpatient Diabetes medications:  Jardiance 25 mg daily, Metformin 1000 mg bid Current orders for Inpatient glycemic control:  Novolog moderate tid with meals Decadron 6 mg daily Lantus 8 units daily Inpatient Diabetes Program Recommendations:     Consider d/c of Lantus and start Levemir 8 units bid.  Also consider adding Novolog 4 units tid with meals- Consider CHO modified diet as well.   Thanks,  Adah Perl, RN, BC-ADM Inpatient Diabetes Coordinator Pager [phone] (8a-5p)

## 2019-07-31 LAB — COMPREHENSIVE METABOLIC PANEL
ALT: 39 U/L (ref 0–44)
AST: 25 U/L (ref 15–41)
Albumin: 3.2 g/dL — ABNORMAL LOW (ref 3.5–5.0)
Alkaline Phosphatase: 80 U/L (ref 38–126)
Anion gap: 11 (ref 5–15)
BUN: 26 mg/dL — ABNORMAL HIGH (ref 6–20)
CO2: 27 mmol/L (ref 22–32)
Calcium: 9.9 mg/dL (ref 8.9–10.3)
Chloride: 101 mmol/L (ref 98–111)
Creatinine, Ser: 0.6 mg/dL (ref 0.44–1.00)
GFR calc Af Amer: >60 mL/min (ref >60)
GFR calc non Af Amer: >60 mL/min (ref >60)
Glucose, Bld: 288 mg/dL — ABNORMAL HIGH (ref 70–99)
Potassium: 4.7 mmol/L (ref 3.5–5.1)
Sodium: 139 mmol/L (ref 135–145)
Total Bilirubin: 0.4 mg/dL (ref 0.3–1.2)
Total Protein: 6.2 g/dL — ABNORMAL LOW (ref 6.5–8.1)

## 2019-07-31 LAB — CBC WITH DIFFERENTIAL/PLATELET
Abs Immature Granulocytes: 0.11 10*3/uL — ABNORMAL HIGH (ref 0.00–0.07)
Basophils Absolute: 0 10*3/uL (ref 0.0–0.1)
Basophils Relative: 0 %
Eosinophils Absolute: 0 10*3/uL (ref 0.0–0.5)
Eosinophils Relative: 0 %
HCT: 43.1 % (ref 36.0–46.0)
Hemoglobin: 14.3 g/dL (ref 12.0–15.0)
Immature Granulocytes: 2 %
Lymphocytes Relative: 17 %
Lymphs Abs: 1.3 10*3/uL (ref 0.7–4.0)
MCH: 30.8 pg (ref 26.0–34.0)
MCHC: 33.2 g/dL (ref 30.0–36.0)
MCV: 92.9 fL (ref 80.0–100.0)
Monocytes Absolute: 0.4 10*3/uL (ref 0.1–1.0)
Monocytes Relative: 6 %
Neutro Abs: 5.4 10*3/uL (ref 1.7–7.7)
Neutrophils Relative %: 75 %
Platelets: 303 10*3/uL (ref 150–400)
RBC: 4.64 MIL/uL (ref 3.87–5.11)
RDW: 12.2 % (ref 11.5–15.5)
WBC: 7.2 10*3/uL (ref 4.0–10.5)
nRBC: 0 % (ref 0.0–0.2)

## 2019-07-31 LAB — GLUCOSE, CAPILLARY
Glucose-Capillary: 312 mg/dL — ABNORMAL HIGH (ref 70–99)
Glucose-Capillary: 278 mg/dL — ABNORMAL HIGH (ref 70–99)
Glucose-Capillary: 414 mg/dL — ABNORMAL HIGH (ref 70–99)
Glucose-Capillary: 332 mg/dL — ABNORMAL HIGH (ref 70–99)
Glucose-Capillary: 305 mg/dL — ABNORMAL HIGH (ref 70–99)

## 2019-07-31 LAB — PHOSPHORUS: Phosphorus: 4.2 mg/dL (ref 2.5–4.6)

## 2019-07-31 LAB — FERRITIN: Ferritin: 97 ng/mL (ref 11–307)

## 2019-07-31 LAB — C-REACTIVE PROTEIN: CRP: 1.4 mg/dL — ABNORMAL HIGH (ref <1.0)

## 2019-07-31 LAB — D-DIMER, QUANTITATIVE: D-Dimer, Quant: <0.27 ug/mL-FEU (ref 0.00–0.50)

## 2019-07-31 LAB — MAGNESIUM: Magnesium: 2.2 mg/dL (ref 1.7–2.4)

## 2019-07-31 MED ORDER — CANAGLIFLOZIN 100 MG PO TABS
100.0000 mg | ORAL_TABLET | Freq: Every day | ORAL | Status: DC
  Administered 2019-08-01: 100 mg via ORAL
  Filled 2019-07-31: qty 1

## 2019-07-31 MED ORDER — INSULIN ASPART 100 UNIT/ML ~~LOC~~ SOLN
0.0000 [IU] | Freq: Three times a day (TID) | SUBCUTANEOUS | Status: DC
  Administered 2019-07-31: 8 [IU] via SUBCUTANEOUS
  Administered 2019-08-01: 3 [IU] via SUBCUTANEOUS
  Administered 2019-08-01: 8 [IU] via SUBCUTANEOUS

## 2019-07-31 MED ORDER — INSULIN ASPART 100 UNIT/ML ~~LOC~~ SOLN
3.0000 [IU] | Freq: Three times a day (TID) | SUBCUTANEOUS | Status: DC
  Administered 2019-07-31 – 2019-08-01 (×4): 3 [IU] via SUBCUTANEOUS

## 2019-07-31 MED ORDER — METFORMIN HCL ER 500 MG PO TB24
1000.0000 mg | ORAL_TABLET | Freq: Two times a day (BID) | ORAL | Status: DC
  Administered 2019-07-31 – 2019-08-01 (×2): 1000 mg via ORAL
  Filled 2019-07-31 (×3): qty 2

## 2019-07-31 MED ORDER — INSULIN ASPART 100 UNIT/ML ~~LOC~~ SOLN
20.0000 [IU] | Freq: Once | SUBCUTANEOUS | Status: AC
  Administered 2019-07-31: 20 [IU] via SUBCUTANEOUS

## 2019-07-31 NOTE — Progress Notes (Signed)
   07/31/19 0603  Vitals  Pulse Rate 82  Oxygen Therapy  SpO2 (!) 89 %  O2 Device Nasal Cannula  O2 Flow Rate (L/min) 3 L/min  Patient Activity (if Appropriate) Ambulating (To bathroom and back to bed.)  POSS Scale (Pasero Opioid Sedation Scale)  POSS *See Group Information* 1-Acceptable,Awake and alert  MEWS Score  MEWS RR 0  MEWS Pulse 0  MEWS Systolic 0  MEWS LOC 0  MEWS Temp 0  MEWS Score 0  MEWS Score Color Green

## 2019-07-31 NOTE — Progress Notes (Signed)
Marland Kitchen.  PROGRESS NOTE    Clearence CheekSusan Somero  FUX:[id number] DOB: 1966/05/15 DOA: 07/25/2019 PCP: Lucky CowboyMcKeown, William, MD   Brief Narrative:   53 y.o. WF diabetes mellitus type 2, hyperlipidemia, hypothyroidism, asthma presents to the ER because of worsening shortness of breath.   diagnosed with COVID-19 pneumonia about 14 days ago.  Patient was rechecked again 3 days ago when she was positive.  Over the last 4 days patient has been feeling very short of breath with right-sided chest pain pleuritic in nature.  As nonproductive cough severe weakness fatigue and taste loss.  Has been having subjective feeling of fever chills no nausea vomiting or diarrhea.  Assessment & Plan:   Principal Problem:   Acute respiratory failure due to COVID-19 [facility](HCC) Active Problems:   Hypothyroidism   Type 2 diabetes mellitus (HCC)   Claudication in peripheral vascular disease (HCC)   Labile hypertension   Acute respiratory failure with hypoxia (HCC)  Acute respiratory failure with hypoxia COVID-19 pneumonia Right side pleurtic pain     - remdesivir till 12/19, decadron tll 12/24     - inflamm markers improved slightly CRP 9-->2-->1.4 Ferritin 159-->118-->97 Dimer  0.27 and stable     - PRN albuterol, antitussives---given a dose of IV lasix earlier today--continue lasix 40 daily for now     - wean O2 as able--currently on lower flow 3 liters and improved from prior 6 liter 48 h ago  Diabetes mellitus type 2     - moderate dose sliding scale; CBG 292-312 Change to Levemir 8 bid from Lantus 8, adding mealtime coverage 3 U -resume home metformin -may need to go home on lantus--insulin teaching today  Hypothyroidism     - continue synthroid 125 mcg daily--OP TSH in 1 MO  Hypertriglyceridemia     - continue pravastatin  History of asthma     - see above  Mild hypercalcemia      - likely from dehydration     - resolved  History of diastolic dysfunction grade 1 per 2D echo done last year     - home lasix  were held, resumed 12/19     Updated husband in detail on phone 12/17 DVT prophylaxis: lovenox Code Status: FULL   Disposition Plan: likely d/c home in 24-48 h  Antimicrobials:  . remdesivir    Subjective: Better able to walk around unit without distrtess Needing less oxygen CP imporve dno fever  Objective: Vitals:   07/31/19 0601 07/31/19 0603 07/31/19 0609 07/31/19 0700  BP: 109/75   105/66  Pulse: 73 82 75   Resp: 16   20  Temp: 98.4 F (36.9 C)   97.7 F (36.5 C)  TempSrc: Oral   Axillary  SpO2: 95% (!) 89% 93% 94%  Weight: 75.8 kg     Height:        Intake/Output Summary (Last 24 hours) at 07/31/2019 0825 Last data filed at 07/31/2019 0604 Gross per 24 hour  Intake 360 ml  Output --  Net 360 ml   Filed Weights   07/25/19 1845 07/31/19 0601  Weight: 76.2 kg 75.8 kg    Examination: eomi nca tno crackles no rales on lung exam Abd sof tnt nd no rebound No le edema No focal deficit    Data Reviewed: I have personally reviewed following labs and imaging studies.  CBC: Recent Labs  Lab 07/27/19 0934 07/28/19 0440 07/29/19 0500 07/30/19 0500 07/31/19 0500  WBC 4.9 5.0 6.2 6.6 7.2  NEUTROABS 3.2 3.0 4.8  5.0 5.4  HGB 14.0 14.6 14.1 14.1 14.3  HCT 43.2 42.7 41.7 42.3 43.1  MCV 94.9 90.7 91.4 93.2 92.9  PLT 211 229 264 305 303   Basic Metabolic Panel: Recent Labs  Lab 07/27/19 0934 07/28/19 0440 07/29/19 0500 07/30/19 0500 07/31/19 0500  NA 136 137 137 137 139  K 3.7 3.8 3.6 4.4 4.7  CL 102 99 98 99 101  CO2 24 25 27 27 27   GLUCOSE 163* 145* 225* 282* 288*  BUN 21* 18 19 21* 26*  CREATININE 0.63 0.46 0.48 0.51 0.60  CALCIUM 8.3* 9.0 9.1 9.4 9.9  MG 1.9 1.9 2.0 2.2 2.2  PHOS 3.2 2.8 2.7 3.2 4.2   GFR: Estimated Creatinine Clearance: 78.4 mL/min (by C-G formula based on SCr of 0.6 mg/dL). Liver Function Tests: Recent Labs  Lab 07/27/19 0934 07/28/19 0440 07/29/19 0500 07/30/19 0500 07/31/19 0500  AST 47* 34 28 32 25  ALT 31 36  35 41 39  ALKPHOS 62 68 77 84 80  BILITOT 0.9 0.4 0.4 0.5 0.4  PROT 5.9* 6.2* 6.1* 6.2* 6.2*  ALBUMIN 2.8* 3.0* 3.0* 3.1* 3.2*   No results for input(s): LIPASE, AMYLASE in the last 168 hours. No results for input(s): AMMONIA in the last 168 hours. Coagulation Profile: No results for input(s): INR, PROTIME in the last 168 hours. Cardiac Enzymes: No results for input(s): CKTOTAL, CKMB, CKMBINDEX, TROPONINI in the last 168 hours. BNP (last 3 results) No results for input(s): PROBNP in the last 8760 hours. HbA1C: No results for input(s): HGBA1C in the last 72 hours. CBG: Recent Labs  Lab 07/30/19 0747 07/30/19 1308 07/30/19 1658 07/30/19 2107 07/31/19 0808  GLUCAP 218* 325* 257* 290* 312*   Lipid Profile: No results for input(s): CHOL, HDL, LDLCALC, TRIG, CHOLHDL, LDLDIRECT in the last 72 hours. Thyroid Function Tests: No results for input(s): TSH, T4TOTAL, FREET4, T3FREE, THYROIDAB in the last 72 hours. Anemia Panel: Recent Labs    07/30/19 0500 07/31/19 0500  FERRITIN 118 97   Sepsis Labs: Recent Labs  Lab 07/25/19 1944 07/25/19 2110 07/26/19 0436  PROCALCITON <0.10  --   --   LATICACIDVEN 3.1* 2.1* 1.4    Recent Results (from the past 240 hour(s))  Blood Culture (routine x 2)     Status: None   Collection Time: 07/25/19  7:44 PM   Specimen: BLOOD  Result Value Ref Range Status   Specimen Description BLOOD RIGHT ANTECUBITAL  Final   Special Requests   Final    BOTTLES DRAWN AEROBIC AND ANAEROBIC Blood Culture results may not be optimal due to an inadequate volume of blood received in culture bottles   Culture   Final    NO GROWTH 5 DAYS Performed at [facility], [address], [place], [place]    Report Status 07/30/2019 FINAL  Final  Blood Culture (routine x 2)     Status: None   Collection Time: 07/25/19  8:22 PM   Specimen: BLOOD  Result Value Ref Range Status   Specimen Description BLOOD LEFT WRIST  Final   Special Requests   Final     BOTTLES DRAWN AEROBIC AND ANAEROBIC Blood Culture results may not be optimal due to an inadequate volume of blood received in culture bottles   Culture   Final    NO GROWTH 5 DAYS Performed at [facility], [address], [place], [place]    Report Status 07/30/2019 FINAL  Final  MRSA  PCR Screening     Status: None   Collection Time: 07/28/19 12:33 PM   Specimen: Nasopharyngeal  Result Value Ref Range Status   MRSA by PCR NEGATIVE NEGATIVE Final    Comment:        The GeneXpert MRSA Assay (FDA approved for NASAL specimens only), is one component of a comprehensive MRSA colonization surveillance program. It is not intended to diagnose MRSA infection nor to guide or monitor treatment for MRSA infections. Performed at [facility], [place] [address] [place], [place] [address]       Radiology Studies: DG CHEST PORT 1 VIEW  Result Date: 07/29/2019 CLINICAL DATA:  Shortness of breath in a COVID-19 positive patient. EXAM: PORTABLE CHEST 1 VIEW COMPARISON:  Single-view of the chest and CT of the chest 07/25/2019. FINDINGS: Patchy bilateral airspace disease consistent with pneumonia has worsened since the comparison exams. No pneumothorax or pleural effusion. Heart size is normal. Thoracolumbar scoliosis noted. IMPRESSION: Worsened appearance of bilateral pneumonia. Electronically Signed   By: Inge Rise M.D.   On: 07/29/2019 09:59     Scheduled Meds: . aspirin EC  81 mg Oral Daily  . cholecalciferol  5,000 Units Oral BID  . dexamethasone (DECADRON) injection  6 mg Intravenous Q12H  . enoxaparin (LOVENOX) injection  40 mg Subcutaneous Q24H  . feeding supplement (ENSURE ENLIVE)  237 mL Oral BID BM  . fenofibrate  160 mg Oral Daily  . furosemide  40 mg Oral Daily  . insulin aspart  0-15 Units Subcutaneous TID WC  . insulin glargine  8 Units Subcutaneous Daily  . levothyroxine  125 mcg Oral Q0600  . magnesium oxide  200 mg Oral Daily  . montelukast  10  mg Oral Daily  . multivitamin with minerals  1 tablet Oral Daily  . pantoprazole  40 mg Oral Daily  . polyethylene glycol  17 g Oral Daily  . pravastatin  20 mg Oral QHS  . pregabalin  75 mg Oral TID  . traZODone  75-150 mg Oral QHS   Continuous Infusions:    LOS: 6 days    Time spent: 15 minutes spent in the coordination of care today.    Verneita Griffes, MD [facility] 8:25 AM  If 7PM-7AM, please contact night-coverage [email or web address] Password [email or web address] 07/31/2019, 8:25 AM

## 2019-07-31 NOTE — Progress Notes (Signed)
VS were taken last night by Kela, NT. VS not appearing from or on [facility].

## 2019-08-01 ENCOUNTER — Encounter: Payer: Self-pay | Admitting: Family Medicine

## 2019-08-01 LAB — COMPREHENSIVE METABOLIC PANEL
ALT: 39 U/L (ref 0–44)
AST: 25 U/L (ref 15–41)
Albumin: 3.2 g/dL — ABNORMAL LOW (ref 3.5–5.0)
Alkaline Phosphatase: 82 U/L (ref 38–126)
Anion gap: 11 (ref 5–15)
BUN: 22 mg/dL — ABNORMAL HIGH (ref 6–20)
CO2: 27 mmol/L (ref 22–32)
Calcium: 9.6 mg/dL (ref 8.9–10.3)
Chloride: 98 mmol/L (ref 98–111)
Creatinine, Ser: 0.58 mg/dL (ref 0.44–1.00)
GFR calc Af Amer: >60 mL/min (ref >60)
GFR calc non Af Amer: >60 mL/min (ref >60)
Glucose, Bld: 266 mg/dL — ABNORMAL HIGH (ref 70–99)
Potassium: 4.5 mmol/L (ref 3.5–5.1)
Sodium: 136 mmol/L (ref 135–145)
Total Bilirubin: 0.3 mg/dL (ref 0.3–1.2)
Total Protein: 5.9 g/dL — ABNORMAL LOW (ref 6.5–8.1)

## 2019-08-01 LAB — D-DIMER, QUANTITATIVE: D-Dimer, Quant: <0.27 ug/mL-FEU (ref 0.00–0.50)

## 2019-08-01 LAB — GLUCOSE, CAPILLARY
Glucose-Capillary: 265 mg/dL — ABNORMAL HIGH (ref 70–99)
Glucose-Capillary: 197 mg/dL — ABNORMAL HIGH (ref 70–99)

## 2019-08-01 LAB — C-REACTIVE PROTEIN: CRP: 0.9 mg/dL (ref <1.0)

## 2019-08-01 LAB — FERRITIN: Ferritin: 91 ng/mL (ref 11–307)

## 2019-08-01 MED ORDER — DEXAMETHASONE 6 MG PO TABS: 6.0000 mg | ORAL_TABLET | Freq: Two times a day (BID) | ORAL | 0 refills | Status: DC

## 2019-08-01 MED ORDER — ACETAMINOPHEN 325 MG PO TABS: 650.0000 mg | ORAL_TABLET | Freq: Four times a day (QID) | ORAL | Status: DC | PRN

## 2019-08-01 MED ORDER — LANTUS SOLOSTAR 100 UNIT/ML ~~LOC~~ SOPN: 14.0000 [IU] | PEN_INJECTOR | Freq: Every day | SUBCUTANEOUS | 0 refills | Status: DC

## 2019-08-01 MED ORDER — LEVEMIR FLEXTOUCH 100 UNIT/ML ~~LOC~~ SOPN: 14.0000 [IU] | PEN_INJECTOR | Freq: Every day | SUBCUTANEOUS | 0 refills | Status: DC

## 2019-08-01 MED ORDER — TRAMADOL HCL 50 MG PO TABS: 100.0000 mg | ORAL_TABLET | Freq: Four times a day (QID) | ORAL | 0 refills | Status: DC | PRN

## 2019-08-01 NOTE — Discharge Summary (Signed)
Physician Discharge Summary  Caitlin Barnett [id number]:[id number] DOB: [birth year] DOA: 07/25/2019  PCP: Unk Pinto, MD  Admit date: 07/25/2019 Discharge date: 08/01/2019  Time spent: 35 minutes  Recommendations for Outpatient Follow-up:  1. Limited prescription of tramadol given 2. Prescription of Lantus 14 units given in addition to Decadron 6 twice daily for 4 more days ending 12/24 and instructions to follow-up with PCP 3. Work note given excusing her from work 4. Needs outpatient follow-up with labs in about 1 to 2 weeks may need titration of insulin if needed as an outpatient because of Decadron 5. Will need outpatient TSH 1 month  Discharge Diagnoses:  Principal Problem:   Acute respiratory failure due to COVID-19 [facility]) Active Problems:   Hypothyroidism   Type 2 diabetes mellitus ([place])   Claudication in peripheral vascular disease ([place])   Labile hypertension   Acute respiratory failure with hypoxia [facility])   Discharge Condition: Improved  Diet recommendation: Diabetic  Filed Weights   07/25/19 1845 07/31/19 0601 08/01/19 0451  Weight: 76.2 kg 75.8 kg 76.6 kg    History of present illness:  53 y.o. WF diabetes mellitus type 2, hyperlipidemia, hypothyroidism, asthma presents to the ER because of worsening shortness of breath.  diagnosed with COVID-19 pneumonia about 14 days ago. Patient was rechecked again 3 days ago when she was positive. Over the last 4 days patient has been feeling very short of breath with right-sided chest pain pleuritic in nature. As nonproductive cough severe weakness fatigue and taste loss. Has been having subjective feeling of fever chills no nausea vomiting or diarrhea.   Hospital Course:  Acute respiratory failure with hypoxia COVID-19 pneumonia Right side pleurtic pain     - remdesivir till 12/19, decadron tll 12/24     - inflamm markers improved slightly -Her respiratory symptoms worsened during hospital stay and she required higher  dose of Decadron and completed a course of remdesivir in the hospital She received twice daily Decadron as per below -She will need home oxygen on discharge at least 2 L and ambulation as she desats to the high 80s she was as high as 6 L prior to discharge     - PRN albuterol, antitussives---given a dose of IV lasix earlier today--continue lasix 40 daily for now  Diabetes mellitus type 2     -Blood sugars were in the 300 range Her medications were adjusted during hospital stay bedtime and she was sent home on Lantus and she was told to resume her Metformin and Jardiance in addition and may need titration in addition of short acting insulin or transition from Lantus 70/30 insulin -may need to go home on lantus--insulin teaching today  Hypothyroidism     - continue synthroid 125 mcg daily--OP TSH in 1 MO  Hypertriglyceridemia     - continue pravastatin  History of asthma     - see above  Mild hypercalcemia      - likely from dehydration     - resolved  History of diastolic dysfunction grade 1 per 2D echo done last year     - home lasix were held, resumed 12/19  Discharge Exam: Vitals:   08/01/19 0451 08/01/19 0547  BP: 100/66 99/82  Pulse: 75 80  Resp: 16   Temp: 97.9 F (36.6 C) 97.8 F (36.6 C)  SpO2: 92% 90%    General: Awake alert coherent feeling much better overall some chest tightness Cardiovascular: S1-S2 no murmur rub or gallop no focal deficits Respiratory: Clinically clear no rales  no rhonchi no wheeze Abdomen soft nontender no rebound no guarding Neurologically intact moving all 4 limbs  Discharge Instructions   Discharge Instructions    For home use only DME oxygen   Complete by: As directed    Length of Need: 6 Months   Mode or (Route): Nasal cannula   Liters per Minute: 2   Oxygen conserving device: Yes   Oxygen delivery system: Gas     Allergies as of 08/01/2019      Reactions   Tetanus Toxoids Other (See Comments)   Injection site  abcess   Sulfa Antibiotics Hives   itching      Medication List    STOP taking these medications   azithromycin 250 MG tablet Commonly known as: Zithromax     TAKE these medications   acetaminophen 325 MG tablet Commonly known as: TYLENOL Take 2 tablets (650 mg total) by mouth every 6 (six) hours as needed for mild pain (or Fever >/= 101).   albuterol (2.5 MG/3ML) 0.083% nebulizer solution Commonly known as: PROVENTIL Take 3 mLs (2.5 mg total) by nebulization every 6 (six) hours as needed for wheezing or shortness of breath. What changed: Another medication with the same name was changed. Make sure you understand how and when to take each.   albuterol 108 (90 Base) MCG/ACT inhaler Commonly known as: VENTOLIN HFA 2 inhalations  15-20  minutes apart every 4 hours as needed to rescue Asthma What changed:   how much to take  how to take this  when to take this  reasons to take this  additional instructions   aspirin EC 81 MG tablet Take 81 mg by mouth daily.   blood glucose meter kit and supplies Kit Dispense based on patient and insurance preference. Use up to four times daily as directed. (FOR ICD-9 250.00, 250.01).   butalbital-acetaminophen-caffeine 50-325-40 MG tablet Commonly known as: FIORICET TAKE 1/2 TO 1 TABLET EVERY 4 HOURS IF NEEDED FOR HEADACHE What changed: See the new instructions.   cetirizine 10 MG tablet Commonly known as: ZYRTEC Take 10 mg by mouth daily.   Cholecalciferol 25 MCG (1000 UT) capsule Take 5,000 Units by mouth 2 (two) times daily.   Cinnamon 500 MG Tabs Take 2 capsules by mouth 2 (two) times daily.   cyanocobalamin 1000 MCG/ML injection Commonly known as: (VITAMIN B-12) INJECT 1 ML (1,000 MCG TOTAL) INTO THE MUSCLE EVERY 30 (THIRTY) DAYS.   dexamethasone 6 MG tablet Commonly known as: Decadron Take 1 tablet (6 mg total) by mouth 2 (two) times daily for 8 days.   empagliflozin 25 MG Tabs tablet Commonly known as:  Jardiance Take 1 tablet Daily for Diabetes What changed:   how much to take  how to take this  when to take this  additional instructions   estrogen (conjugated)-medroxyprogesterone 0.45-1.5 MG tablet Commonly known as: Prempro TAKE 1 TABLET BY MOUTH EVERY DAY What changed:   how much to take  how to take this  when to take this  additional instructions   fenofibrate 145 MG tablet Commonly known as: TRICOR Take 1 tablet Daily for Triglycerides (Blood Fats) What changed:   how much to take  how to take this  when to take this  additional instructions   furosemide 40 MG tablet Commonly known as: LASIX TAKE 40 MG (1 TAB) BY MOUTH DAILY. MAY TAKE 1/2 TAB ON MOST DAYS AND 1 FULL TAB DAILY WHEN NEEDED. What changed: See the new instructions.   ibuprofen 200  MG tablet Commonly known as: ADVIL Take 800 mg by mouth as needed for moderate pain.   Lantus SoloStar 100 UNIT/ML Solostar Pen Generic drug: Insulin Glargine Inject 14 Units into the skin daily.   levothyroxine 125 MCG tablet Commonly known as: SYNTHROID Take 1 tablet daily on an empty stomach with only water for 30 minutes & no Antacid meds, Calcium or Magnesium for 4 hours & avoid Biotin What changed:   how much to take  how to take this  when to take this  additional instructions   Magnesium 250 MG Tabs Take 250 mg by mouth daily.   metFORMIN 500 MG 24 hr tablet Commonly known as: GLUCOPHAGE-XR TAKE 2 TABLETS 2 X / DAY FOR DIABETES What changed: See the new instructions.   montelukast 10 MG tablet Commonly known as: SINGULAIR TAKE 1 TABLET DAILY FOR ALLERGIES What changed: See the new instructions.   omeprazole 40 MG capsule Commonly known as: PRILOSEC TAKE 1 CAPSULE DAILY FOR ACID INDIGESTION What changed: See the new instructions.   potassium chloride 10 MEQ tablet Commonly known as: KLOR-CON Take 1 tablet Daily What changed:   how much to take  how to take this  when to  take this  additional instructions   pravastatin 20 MG tablet Commonly known as: PRAVACHOL Take 1 tablet at Bedtime for Cholesterol What changed:   how much to take  how to take this  when to take this  additional instructions   pregabalin 75 MG capsule Commonly known as: LYRICA Take 1 capsule 3 x /day for Neuropathy Pains What changed:   how much to take  how to take this  when to take this  additional instructions   traMADol 50 MG tablet Commonly known as: Ultram Take 2 tablets (100 mg total) by mouth every 6 (six) hours as needed for up to 12 doses.   traZODone 150 MG tablet Commonly known as: DESYREL TAKE 1/2 TO 1 TABLET 1 HOUR BEFORE BEDTIME What changed: See the new instructions.   valACYclovir 1000 MG tablet Commonly known as: VALTREX Take 1 tablet 3 x day for Fever Blisters / Cold Sores What changed:   how much to take  how to take this  when to take this  reasons to take this  additional instructions            Durable Medical Equipment  (From admission, onward)         Start     Ordered   08/01/19 0000  For home use only DME oxygen    Question Answer Comment  Length of Need 6 Months   Mode or (Route) Nasal cannula   Liters per Minute 2   Oxygen conserving device Yes   Oxygen delivery system Gas      08/01/19 0930         Allergies  Allergen Reactions  . Tetanus Toxoids Other (See Comments)    Injection site abcess  . Sulfa Antibiotics Hives    itching      The results of significant diagnostics from this hospitalization (including imaging, microbiology, ancillary and laboratory) are listed below for reference.    Significant Diagnostic Studies: CT Angio Chest PE W and/or Wo Contrast  Result Date: 07/25/2019 CLINICAL DATA:  Worsening shortness of breath, COVID-19 positive for 2 weeks. EXAM: CT ANGIOGRAPHY CHEST WITH CONTRAST TECHNIQUE: Multidetector CT imaging of the chest was performed using the standard protocol  during bolus administration of intravenous contrast. Multiplanar CT image reconstructions and  MIPs were obtained to evaluate the vascular anatomy. CONTRAST:  74m OMNIPAQUE IOHEXOL 350 MG/ML SOLN COMPARISON:  Radiograph earlier this day. Chest CTA 12/01/2017 FINDINGS: Cardiovascular: There are no filling defects within the pulmonary arteries to suggest pulmonary embolus. Thoracic aorta is normal in caliber. No aortic dissection. Heart is normal in size. No pericardial effusion. Mediastinum/Nodes: Small mediastinal lymph nodes not enlarged by size criteria. No hilar adenopathy. Decompressed esophagus. No focal thyroid nodule. Lungs/Pleura: Bilateral heterogeneous primarily ground-glass opacities within both lungs with a peripheral predominant distribution. Mild central bronchial thickening. No evidence of pulmonary edema. No pleural fluid. Upper Abdomen: Hepatic steatosis. No acute findings. Musculoskeletal: There are no acute or suspicious osseous abnormalities. Mild broad-based rightward scoliotic curvature of spine. Review of the MIP images confirms the above findings. IMPRESSION: 1. No pulmonary embolus. 2. Heterogeneous ground-glass opacities within both lungs with a peripheral predominant distribution. Pattern most consistent with COVID-19 pneumonia. 3. Incidental hepatic steatosis in the upper abdomen. Electronically Signed   By: MKeith RakeM.D.   On: 07/25/2019 23:05   DG CHEST PORT 1 VIEW  Result Date: 07/29/2019 CLINICAL DATA:  Shortness of breath in a COVID-19 positive patient. EXAM: PORTABLE CHEST 1 VIEW COMPARISON:  Single-view of the chest and CT of the chest 07/25/2019. FINDINGS: Patchy bilateral airspace disease consistent with pneumonia has worsened since the comparison exams. No pneumothorax or pleural effusion. Heart size is normal. Thoracolumbar scoliosis noted. IMPRESSION: Worsened appearance of bilateral pneumonia. Electronically Signed   By: TInge RiseM.D.   On: 07/29/2019  09:59   DG Chest Port 1 View  Result Date: 07/25/2019 CLINICAL DATA:  Shortness of breath EXAM: PORTABLE CHEST 1 VIEW COMPARISON:  12/01/2017 FINDINGS: The heart size and mediastinal contours are within normal limits. Heterogeneous airspace opacity of the bilateral lung bases. The visualized skeletal structures are unremarkable. IMPRESSION: Heterogeneous airspace opacity of the bilateral lung bases, consistent with infection. Electronically Signed   By: AEddie CandleM.D.   On: 07/25/2019 20:36    Microbiology: Recent Results (from the past 240 hour(s))  Blood Culture (routine x 2)     Status: None   Collection Time: 07/25/19  7:44 PM   Specimen: BLOOD  Result Value Ref Range Status   Specimen Description BLOOD RIGHT ANTECUBITAL  Final   Special Requests   Final    BOTTLES DRAWN AEROBIC AND ANAEROBIC Blood Culture results may not be optimal due to an inadequate volume of blood received in culture bottles   Culture   Final    NO GROWTH 5 DAYS [facility] [id number], [place] [id number]   Report Status 07/30/2019 FINAL  Final  Blood Culture (routine x 2)     Status: None   Collection Time: 07/25/19  8:22 PM   Specimen: BLOOD  Result Value Ref Range Status   Specimen Description BLOOD LEFT WRIST  Final   Special Requests   Final    BOTTLES DRAWN AEROBIC AND ANAEROBIC Blood Culture results may not be optimal due to an inadequate volume of blood received in culture bottles   Culture   Final    NO GROWTH 5 DAYS [facility]formed at [facility] [id number] [place]  [id number]   Report Status 07/30/2019 FINAL  Final  MRSA PCR Screening     Status: None   Collection Time: 07/28/19 12:33 PM   Specimen: Nasopharyngeal  Result Value Ref Range Status   MRSA by PCR NEGATIVE NEGATIVE  Final    Comment:        The GeneXpert MRSA Assay (FDA approved for NASAL specimens only), is one component of a comprehensive MRSA colonization surveillance  program. It is not intended to diagnose MRSA infection nor to guide or monitor treatment for MRSA infections. Performed at [facility], [place] [address] [place], [place] [address]      Labs: Basic Metabolic Panel: Recent Labs  Lab 07/27/19 0934 07/28/19 0440 07/29/19 0500 07/30/19 0500 07/31/19 0500 08/01/19 0737  NA 136 137 137 137 139 136  K 3.7 3.8 3.6 4.4 4.7 4.5  CL 102 99 98 99 101 98  CO2 '24 25 27 27 27 27  '$ GLUCOSE 163* 145* 225* 282* 288* 266*  BUN 21* 18 19 21* 26* 22*  CREATININE 0.63 0.46 0.48 0.51 0.60 0.58  CALCIUM 8.3* 9.0 9.1 9.4 9.9 9.6  MG 1.9 1.9 2.0 2.2 2.2  --   PHOS 3.2 2.8 2.7 3.2 4.2  --    Liver Function Tests: Recent Labs  Lab 07/28/19 0440 07/29/19 0500 07/30/19 0500 07/31/19 0500 08/01/19 0737  AST 34 28 32 25 25  ALT 36 35 41 39 39  ALKPHOS 68 77 84 80 82  BILITOT 0.4 0.4 0.5 0.4 0.3  PROT 6.2* 6.1* 6.2* 6.2* 5.9*  ALBUMIN 3.0* 3.0* 3.1* 3.2* 3.2*   No results for input(s): LIPASE, AMYLASE in the last 168 hours. No results for input(s): AMMONIA in the last 168 hours. CBC: Recent Labs  Lab 07/27/19 0934 07/28/19 0440 07/29/19 0500 07/30/19 0500 07/31/19 0500  WBC 4.9 5.0 6.2 6.6 7.2  NEUTROABS 3.2 3.0 4.8 5.0 5.4  HGB 14.0 14.6 14.1 14.1 14.3  HCT 43.2 42.7 41.7 42.3 43.1  MCV 94.9 90.7 91.4 93.2 92.9  PLT 211 229 264 305 303   Cardiac Enzymes: No results for input(s): CKTOTAL, CKMB, CKMBINDEX, TROPONINI in the last 168 hours. BNP: BNP (last 3 results) No results for input(s): BNP in the last 8760 hours.  ProBNP (last 3 results) No results for input(s): PROBNP in the last 8760 hours.  CBG: Recent Labs  Lab 07/31/19 1128 07/31/19 1643 07/31/19 1815 07/31/19 2157 08/01/19 0755  GLUCAP 278* 414* 332* 305* 265*       Signed:  Nita Sells MD   [facility] 08/01/2019, 9:49 AM

## 2019-08-01 NOTE — Care Management (Signed)
Notified nurse via secure chat that patient will DC today needing home oxygen. Patient will require ambulatory sats and DME order.  Patient states that her pharmacy has notified her that Lantus is not covered. I called [facility] and they said that Alysia Penna, and Levemir are all covered, I relayed this to Dr Verlon Au who states he will update Rx.

## 2019-08-01 NOTE — TOC Transition Note (Addendum)
Transition of Care [facility]) - CM/SW Discharge Note   Patient Details  Name: Caitlin Barnett MRN: [id number] Date of Birth: 12/07/1965  Transition of Care [facility]) CM/SW Contact:  Caitlin Collet, RN Phone Number: 08/01/2019, 11:52 AM   Clinical Narrative:    O2 will be delivered to room fo rtransport home through [place]. Could not reach nurse by phone, sent second message requesting qualifying O2 sat documentation. Caitlin Barnett they will deliver home concentrator and portable tank to house in the next hour or so. They will show spouse how to use them and then spouse can bring portable tank with him to the hospital to take patient home with. Patient updated. I also spoke with the spouse to ensure that he understands that he has to bring the portable tank with him.      Final next level of care: Home/Self Care Barriers to Discharge: No Barriers Identified   Patient Goals and CMS Choice        Discharge Placement                       Discharge Plan and Services                DME Arranged: Oxygen DME Agency: Caitlin Barnett Date DME Agency Contacted: 08/01/19 Time DME Agency Contacted: 1950 Representative spoke with at DME Agency: Caitlin (Myrtle) Interventions     Readmission Risk Interventions No flowsheet data found.

## 2019-08-01 NOTE — Progress Notes (Addendum)
[place] to be D/C'd Home per MD order.  Discussed with the patient and all questions fully answered.  VSS, Skin clean, dry and intact without evidence of skin break down, no evidence of skin tears noted. IV catheter was not discontinued by nurse. Patient removed IV catheter. Site without signs and symptoms of complications. Dressing and pressure applied.  An After Visit Summary was printed and given to the patient. Patient received prescription.  D/c education completed with patient/family including follow up instructions, medication list, d/c activities limitations if indicated, with other d/c instructions as indicated by MD - patient able to verbalize understanding, all questions fully answered.   Patient instructed to return to ED, call 911, or call MD for any changes in condition.   Patient escorted via [place], and D/C home via private auto.  Dene Gentry 08/01/2019 12:48 PM

## 2019-08-01 NOTE — Progress Notes (Signed)
SATURATION QUALIFICATIONS: (This note is used to comply with regulatory documentation for home oxygen)  Patient Saturations on Room Air at Rest = 93%  Patient Saturations on Room Air while Ambulating = 91%  Patient Saturations on 2 Liters of oxygen while Ambulating = 96%  Please briefly explain why patient needs home oxygen: patient does not qualify for home oxygen  Dene Gentry, RN

## 2019-08-02 ENCOUNTER — Other Ambulatory Visit: Payer: Self-pay | Admitting: Internal Medicine

## 2019-08-02 ENCOUNTER — Telehealth: Payer: Self-pay | Admitting: *Deleted

## 2019-08-02 MED ORDER — INSULIN PEN NEEDLE 32G X 6 MM MISC: 99 refills | Status: DC

## 2019-08-02 NOTE — Telephone Encounter (Signed)
Called patient on 08/02/2019 , 11:17 AM in an attempt to reach the patient for a hospital follow up.   Admit date: 07/25/19 Discharge: 08/01/19   She does not have any questions or concerns about medications from the hospital admission. The patient's medications were reviewed over the phone, they were counseled to bring in all current medications to the hospital follow up visit.   I advised the patient to call if any questions or concerns arise about the hospital admission or medications    Home health was not started in the hospital. Patient is in self isolation and prefers no home health at this time.  All questions were answered and a follow up appointment was made. Patient has an telephone appointment 08/09/2019 with Caryl Pina Corbett,NP.  Per Dr Melford Aase, the patient will need an in person visit about 10 days after the telephone visit. The patient was informed her out of work time can be extended beyond 08/19/2019, if needed, per Dr Melford Aase.  Prior to Admission medications   Medication Sig Start Date End Date Taking? Authorizing Provider  acetaminophen (TYLENOL) 325 MG tablet Take 2 tablets (650 mg total) by mouth every 6 (six) hours as needed for mild pain (or Fever >/= 101). 08/01/19   Nita Sells, MD  albuterol (PROVENTIL HFA;VENTOLIN HFA) 108 (90 Base) MCG/ACT inhaler 2 inhalations  15-20  minutes apart every 4 hours as needed to rescue Asthma Patient taking differently: Inhale 2 puffs into the lungs every 4 (four) hours as needed (asthma). Inhale 15-20  minutes apart 07/29/18   Unk Pinto, MD  albuterol (PROVENTIL) (2.5 MG/3ML) 0.083% nebulizer solution Take 3 mLs (2.5 mg total) by nebulization every 6 (six) hours as needed for wheezing or shortness of breath. 12/05/17   Annita Brod, MD  aspirin EC 81 MG tablet Take 81 mg by mouth daily.    [provider]  blood glucose meter kit and supplies KIT Dispense based on patient and insurance preference. Use up to  four times daily as directed. (FOR ICD-9 250.00, 250.01). 12/05/17   Annita Brod, MD  butalbital-acetaminophen-caffeine (FIORICET, ESGIC) 50-325-40 MG tablet TAKE 1/2 TO 1 TABLET EVERY 4 HOURS IF NEEDED FOR HEADACHE Patient taking differently: Take 0.5 tablets by mouth every 4 (four) hours as needed for headache.  07/29/18   Unk Pinto, MD  cetirizine (ZYRTEC) 10 MG tablet Take 10 mg by mouth daily.    [provider]  Cholecalciferol 1000 units capsule Take 5,000 Units by mouth 2 (two) times daily.     [provider]  Cinnamon 500 MG TABS Take 2 capsules by mouth 2 (two) times daily.    [provider]  cyanocobalamin (,VITAMIN B-12,) 1000 MCG/ML injection INJECT 1 ML (1,000 MCG TOTAL) INTO THE MUSCLE EVERY 30 (THIRTY) DAYS. 05/20/19   Unk Pinto, MD  dexamethasone (DECADRON) 6 MG tablet Take 1 tablet (6 mg total) by mouth 2 (two) times daily for 8 days. 08/01/19 08/09/19  Nita Sells, MD  empagliflozin (JARDIANCE) 25 MG TABS tablet Take 1 tablet Daily for Diabetes Patient taking differently: Take 25 mg by mouth daily.  12/03/18   Unk Pinto, MD  estrogen, conjugated,-medroxyprogesterone (PREMPRO) 0.45-1.5 MG tablet TAKE 1 TABLET BY MOUTH EVERY DAY Patient taking differently: Take 1 tablet by mouth daily.  01/09/19   Unk Pinto, MD  fenofibrate (TRICOR) 145 MG tablet Take 1 tablet Daily for Triglycerides (Blood Fats) Patient taking differently: Take 145 mg by mouth daily.  05/29/19   Unk Pinto, MD  furosemide (LASIX) 40 MG tablet TAKE 40 MG (1 TAB) BY MOUTH DAILY. MAY TAKE 1/2 TAB ON MOST DAYS AND 1 FULL TAB DAILY WHEN NEEDED. Patient taking differently: Take 20-40 mg by mouth See admin instructions. May take 6m on most days and 484mdaily when needed. 02/16/19   CrLelon PerlaMD  ibuprofen (ADVIL,MOTRIN) 200 MG tablet Take 800 mg by mouth as needed for moderate pain.    [provider]  Insulin Detemir (LEVEMIR  FLEXTOUCH) 100 UNIT/ML Pen Inject 14 Units into the skin daily for 20 days. 08/01/19 08/21/19  SaNita SellsMD  Insulin Pen Needle 32G X 6 MM MISC Use as directed for Levemir Flextouch pen 08/02/19   McUnk PintoMD  levothyroxine (SYNTHROID) 125 MCG tablet Take 1 tablet daily on an empty stomach with only water for 30 minutes & no Antacid meds, Calcium or Magnesium for 4 hours & avoid Biotin Patient taking differently: Take 125 mcg by mouth daily before breakfast. On an empty stomach with only water for 30 minutes & no Antacid meds, Calcium or Magnesium for 4 hours & avoid Biotin 05/20/19   McUnk PintoMD  Magnesium 250 MG TABS Take 250 mg by mouth daily.    [provider]  metFORMIN (GLUCOPHAGE-XR) 500 MG 24 hr tablet TAKE 2 TABLETS 2 X / DAY FOR DIABETES Patient taking differently: Take 1,000 mg by mouth 2 (two) times daily.  01/13/19   CoLiane ComberNP  montelukast (SINGULAIR) 10 MG tablet TAKE 1 TABLET DAILY FOR ALLERGIES Patient taking differently: Take 10 mg by mouth daily.  01/14/19   CoVicie MuttersPA-C  omeprazole (PRILOSEC) 40 MG capsule TAKE 1 CAPSULE DAILY FOR ACID INDIGESTION Patient taking differently: Take 40 mg by mouth daily.  12/26/18   McUnk PintoMD  potassium chloride (K-DUR) 10 MEQ tablet Take 1 tablet Daily Patient taking differently: Take 10 mEq by mouth daily.  12/26/18   McUnk PintoMD  pravastatin (PRAVACHOL) 20 MG tablet Take 1 tablet at Bedtime for Cholesterol Patient taking differently: Take 20 mg by mouth at bedtime.  05/20/19   McUnk PintoMD  pregabalin (LYRICA) 75 MG capsule Take 1 capsule 3 x /day for Neuropathy Pains Patient taking differently: Take 75 mg by mouth 3 (three) times daily. For Neuropathy Pains 01/09/19   McUnk PintoMD  traMADol (ULTRAM) 50 MG tablet Take 2 tablets (100 mg total) by mouth every 6 (six) hours as needed for up to 12 doses. 08/01/19   SaNita SellsMD  traZODone (DESYREL) 150 MG  tablet TAKE 1/2 TO 1 TABLET 1 HOUR BEFORE BEDTIME Patient taking differently: Take 150 mg by mouth at bedtime. 1 hour before Bedtime 04/09/19   CoLiane ComberNP  valACYclovir (VALTREX) 1000 MG tablet Take 1 tablet 3 x day for Fever Blisters / Cold Sores Patient taking differently: Take 500 mg by mouth 3 (three) times daily as needed (fever blisters/cold sores).  09/30/18   McUnk PintoMD

## 2019-08-03 ENCOUNTER — Other Ambulatory Visit: Payer: Self-pay | Admitting: Adult Health

## 2019-08-03 DIAGNOSIS — E1140 Type 2 diabetes mellitus with diabetic neuropathy, unspecified: Secondary | ICD-10-CM

## 2019-08-07 NOTE — Progress Notes (Signed)
Virtual Visit via Telephone Note  I connected with Caitlin Barnett on 08/09/19 at 10:45 AM EST by telephone and verified that I am speaking with the correct person using two identifiers.  Location: Patient: home Provider: [place] office    I discussed the limitations, risks, security and privacy concerns of performing an evaluation and management service by telephone and the availability of in person appointments. I also discussed with the patient that there may be a patient responsible charge related to this service. The patient expressed understanding and agreed to proceed.   I discussed the assessment and treatment plan with the patient. The patient was provided an opportunity to ask questions and all were answered. The patient agreed with the plan and demonstrated an understanding of the instructions.   The patient was advised to call back or seek an in-person evaluation if the symptoms worsen or if the condition fails to improve as anticipated.  I provided 30 minutes of non-face-to-face time during this encounter.   Caitlin Ribas, NP     Hospital follow up  Assessment and Plan: Hospital visit follow up for:   Caitlin Barnett was seen today for follow-up.  Diagnoses and all orders for this visit:  Acute respiratory failure due to COVID-19 (HCC)/Acute respiratory failure with hypoxia (HCC) O2 levels are improved from in-patient, managing with 2L at night, PRN during day Discussed slow recovery over weeks to months is expected She preceives benefit from decadron; will continue very slow taper, follow up in 1 week  She will repeat covid 19 testing then will get repeat CXR if negative  -     dexamethasone (DECADRON) 2 MG tablet; Take 4 mg BID for 4 days, then reduce to 2 mg twice a day for 1 week.  Type 2 diabetes mellitus with hyperglycemia, with long-term current use of insulin (HCC) Labile elevated values; likely due to decadron; continue metformin, levemir Will add glipizide 2.5- 5 mg  to take PRN elevated glucose with meals -     glipiZIDE (GLUCOTROL) 5 MG tablet; Take 1/2- 1 tab three times a day as needed with meals for diabetes.  Chronic diastolic (congestive) heart failure (HCC) Recently fairly controlled; weights are down No acute exacerbation this admission Continue watching sodium/fluids, daily monitoring of weight If any increasing shortness of breath, swelling, or chest pressure go to ER immediately.    Labile hypertension Continue medications; recheck CMP/GFR, magnesium at next OV in 1 week  Monitor blood pressure at home; call if consistently over 130/80 Continue DASH diet.   Reminder to go to the ER if any CP, SOB, nausea, dizziness, severe HA, changes vision/speech, left arm numbness and tingling and jaw pain.  Hypothyroidism, unspecified type Recheck TSH at next in office OV  Nonintractable headache, unspecified chronicity pattern, unspecified headache type -     butalbital-acetaminophen-caffeine (FIORICET) 50-325-40 MG tablet; Take 0.5 tablets by mouth every 4 (four) hours as needed for headache.   All medications were reviewed with patient and family and fully reconciled. All questions answered fully, and patient and family members were encouraged to call the office with any further questions or concerns. Discussed goal to avoid readmission related to this diagnosis.   Medications Discontinued During This Encounter  Medication Reason  . dexamethasone (DECADRON) 6 MG tablet   . butalbital-acetaminophen-caffeine (FIORICET, ESGIC) 50-325-40 MG tablet Reorder   Over 40 minutes of exam, counseling, chart review, and complex, high/moderate level critical decision making was performed this visit.   Future Appointments  Date Time Provider  [place]  08/16/2019  4:00 PM Liane Comber, NP [facility] None  10/06/2019  3:00 PM Unk Pinto, MD [facility] None    HPI 53 y.o.female presents for follow up for transition from recent hospitalization  or SNIF stay. Admit date to the hospital was 07/25/19, patient was discharged from the hospital on 08/01/19 and our clinical staff contacted the office the day after discharge to set up a follow up appointment. The discharge summary, medications, and diagnostic test results were reviewed before meeting with the patient. The patient was admitted for:    Discharge Diagnoses:  Principal Problem:   Acute respiratory failure due to COVID-19 [facility]) Active Problems:   Hypothyroidism   Type 2 diabetes mellitus ([place])   Claudication in peripheral vascular disease ([place])   Labile hypertension   Acute respiratory failure with hypoxia ([place])  Recommendations for Outpatient Follow-up:  1. Limited prescription of tramadol given 2. Prescription of Lantus 14 units given in addition to Decadron 6 twice daily for 4 more days ending 12/24 and instructions to follow-up with PCP 3. Work note given excusing her from work 4. Needs outpatient follow-up with labs in about 1 to 2 weeks may need titration of insulin if needed as an outpatient because of Decadron 5. Will need outpatient TSH 1 month   History of present illness:  53 y.o. WF diabetes mellitus type 2, hyperlipidemia, hypothyroidism, asthma presents to the ER because of worsening shortness of breath.  diagnosed with COVID-19 pneumonia about 14 days ago. Patient was rechecked again 3 days ago when she was positive. Over the last 4 days patient has been feeling very short of breath with right-sided chest pain pleuritic in nature. As nonproductive cough severe weakness fatigue and taste loss. Has been having subjective feeling of fever chills no nausea vomiting or diarrhea.  Follow up 08/09/2019:  Wt 163 lb (73.9 kg)   SpO2 92%   BMI 29.81 kg/m  She reports still with shortness of breath, chest tightness intermittently when she feel pushes herself too hard; lots of residual fatigue. Feels weak going up and down steps. Wears oxygen 2L [place] PRN during the  day, needs after has been up and moving around, wearing 2L at night when sleeping. Monitoring O2 sat, 92% on RA, 95-96% with 2L. Denies cough. She reports ongoing bilateral burning/pleuritic pain bilaterally with deep inspiration. She wonders about need for follow up CXR. She finished last decadron dose this AM, has been taking 6 mg BID, reports this does seem to help significantly. She is She reports taking metformin with levemir 14 units, very labile values ranging 130-300+ checking TID. Discussed addition of variable dose glipizide due to labile values. She is tentatively planning to get rechecked for covid 19 and return to work gradually next week if negative.    Hospital Course:  Acute respiratory failure with hypoxia COVID-19 pneumonia Right side pleurtic pain - remdesivir till 12/19, decadron tll 12/24 - inflamm markers improved slightly -Her respiratory symptoms worsened during hospital stay and she required higher dose of Decadron and completed a course of remdesivir in the hospital She received twice daily Decadron as per below -She will need home oxygen on discharge at least 2 L and ambulation as she desats to the high 80s she was as high as 6 L prior to discharge - PRN albuterol, antitussives---given a dose of IV lasix earlier today--continue lasix 40 daily for now  Diabetes mellitus type 2 -Blood sugars were in the 300 range Her medications were adjusted during hospital  stay bedtime and she was sent home on Lantus and she was told to resume her Metformin and Jardiance in addition and may need titration in addition of short acting insulin or transition from Lantus 70/30 insulin -may need to go home on lantus--insulin teaching today  Hypothyroidism - continue synthroid 125 mcg daily--OP TSH in 1 MO  She is on thyroid medication. Her medication was not changed last visit.   Lab Results  Component Value Date   TSH 0.51 08/20/2018    Hypertriglyceridemia - continue pravastatin  History of asthma - see above  Mild hypercalcemia  - likely from dehydration - resolved  Lab Results  Component Value Date   NA 136 08/01/2019   K 4.5 08/01/2019   CL 98 08/01/2019   CO2 27 08/01/2019   GLUCOSE 266 (H) 08/01/2019   BUN 22 (H) 08/01/2019   CREATININE 0.58 08/01/2019   CALCIUM 9.6 08/01/2019   GFRAA >60 08/01/2019   GFRNONAA >60 08/01/2019   History of diastolic dysfunction grade 1 per 2D echo done last year - home lasix were held, resumed 12/19  She has a history of Diastolic CHF Denies orthopnea, paroxysmal nocturnal dyspnea and edema. Positive for dyspnea and fatigue. Weights are trending down.  Wt Readings from Last 3 Encounters:  08/09/19 163 lb (73.9 kg)  08/01/19 168 lb 14 oz (76.6 kg)  05/11/19 177 lb 9.6 oz (80.6 kg)    Home health is not involved.   Images while in the hospital: CT Angio Chest PE W and/or Wo Contrast  Result Date: 07/25/2019 CLINICAL DATA:  Worsening shortness of breath, COVID-19 positive for 2 weeks. EXAM: CT ANGIOGRAPHY CHEST WITH CONTRAST TECHNIQUE: Multidetector CT imaging of the chest was performed using the standard protocol during bolus administration of intravenous contrast. Multiplanar CT image reconstructions and MIPs were obtained to evaluate the vascular anatomy. CONTRAST:  75m OMNIPAQUE IOHEXOL 350 MG/ML SOLN COMPARISON:  Radiograph earlier this day. Chest CTA 12/01/2017 FINDINGS: Cardiovascular: There are no filling defects within the pulmonary arteries to suggest pulmonary embolus. Thoracic aorta is normal in caliber. No aortic dissection. Heart is normal in size. No pericardial effusion. Mediastinum/Nodes: Small mediastinal lymph nodes not enlarged by size criteria. No hilar adenopathy. Decompressed esophagus. No focal thyroid nodule. Lungs/Pleura: Bilateral heterogeneous primarily ground-glass opacities within both lungs with a peripheral  predominant distribution. Mild central bronchial thickening. No evidence of pulmonary edema. No pleural fluid. Upper Abdomen: Hepatic steatosis. No acute findings. Musculoskeletal: There are no acute or suspicious osseous abnormalities. Mild broad-based rightward scoliotic curvature of spine. Review of the MIP images confirms the above findings. IMPRESSION: 1. No pulmonary embolus. 2. Heterogeneous ground-glass opacities within both lungs with a peripheral predominant distribution. Pattern most consistent with COVID-19 pneumonia. 3. Incidental hepatic steatosis in the upper abdomen. Electronically Signed   By: MKeith RakeM.D.   On: 07/25/2019 23:05   DG Chest Port 1 View  Result Date: 07/25/2019 CLINICAL DATA:  Shortness of breath EXAM: PORTABLE CHEST 1 VIEW COMPARISON:  12/01/2017 FINDINGS: The heart size and mediastinal contours are within normal limits. Heterogeneous airspace opacity of the bilateral lung bases. The visualized skeletal structures are unremarkable. IMPRESSION: Heterogeneous airspace opacity of the bilateral lung bases, consistent with infection. Electronically Signed   By: AEddie CandleM.D.   On: 07/25/2019 20:36     Current Outpatient Medications (Endocrine & Metabolic):  .  dexamethasone (DECADRON) 2 MG tablet, Take 4 mg BID for 4 days, then reduce to 2 mg twice a day for  1 week. .  empagliflozin (JARDIANCE) 25 MG TABS tablet, Take 1 tablet Daily for Diabetes (Patient taking differently: Take 25 mg by mouth daily. ) .  estrogen, conjugated,-medroxyprogesterone (PREMPRO) 0.45-1.5 MG tablet, TAKE 1 TABLET BY MOUTH EVERY DAY (Patient taking differently: Take 1 tablet by mouth daily. ) .  Insulin Detemir (LEVEMIR FLEXTOUCH) 100 UNIT/ML Pen, Inject 14 Units into the skin daily for 20 days. Marland Kitchen  levothyroxine (SYNTHROID) 125 MCG tablet, Take 1 tablet daily on an empty stomach with only water for 30 minutes & no Antacid meds, Calcium or Magnesium for 4 hours & avoid Biotin (Patient  taking differently: Take 125 mcg by mouth daily before breakfast. On an empty stomach with only water for 30 minutes & no Antacid meds, Calcium or Magnesium for 4 hours & avoid Biotin) .  metFORMIN (GLUCOPHAGE-XR) 500 MG 24 hr tablet, TAKE 2 TABLETS BY MOUTH TWICE A DAY FOR DIABETES .  glipiZIDE (GLUCOTROL) 5 MG tablet, Take 1/2- 1 tab three times a day as needed with meals for diabetes.  Current Outpatient Medications (Cardiovascular):  .  fenofibrate (TRICOR) 145 MG tablet, Take 1 tablet Daily for Triglycerides (Blood Fats) (Patient taking differently: Take 145 mg by mouth daily. ) .  furosemide (LASIX) 40 MG tablet, TAKE 40 MG (1 TAB) BY MOUTH DAILY. MAY TAKE 1/2 TAB ON MOST DAYS AND 1 FULL TAB DAILY WHEN NEEDED. (Patient taking differently: Take 20-40 mg by mouth See admin instructions. May take 16m on most days and 459mdaily when needed.) .  pravastatin (PRAVACHOL) 20 MG tablet, Take 1 tablet at Bedtime for Cholesterol (Patient taking differently: Take 20 mg by mouth at bedtime. )  Current Outpatient Medications (Respiratory):  .  albuterol (PROVENTIL HFA;VENTOLIN HFA) 108 (90 Base) MCG/ACT inhaler, 2 inhalations  15-20  minutes apart every 4 hours as needed to rescue Asthma (Patient taking differently: Inhale 2 puffs into the lungs every 4 (four) hours as needed (asthma). Inhale 15-20  minutes apart) .  albuterol (PROVENTIL) (2.5 MG/3ML) 0.083% nebulizer solution, Take 3 mLs (2.5 mg total) by nebulization every 6 (six) hours as needed for wheezing or shortness of breath. .  cetirizine (ZYRTEC) 10 MG tablet, Take 10 mg by mouth daily. .  montelukast (SINGULAIR) 10 MG tablet, TAKE 1 TABLET DAILY FOR ALLERGIES (Patient taking differently: Take 10 mg by mouth daily. )  Current Outpatient Medications (Analgesics):  .  acetaminophen (TYLENOL) 325 MG tablet, Take 2 tablets (650 mg total) by mouth every 6 (six) hours as needed for mild pain (or Fever >/= 101). . Marland Kitchenaspirin EC 81 MG tablet, Take 81 mg  by mouth daily. .  butalbital-acetaminophen-caffeine (FIORICET) 50-325-40 MG tablet, Take 0.5 tablets by mouth every 4 (four) hours as needed for headache. .  ibuprofen (ADVIL,MOTRIN) 200 MG tablet, Take 800 mg by mouth as needed for moderate pain. .  traMADol (ULTRAM) 50 MG tablet, Take 2 tablets (100 mg total) by mouth every 6 (six) hours as needed for up to 12 doses.  Current Outpatient Medications (Hematological):  .  cyanocobalamin (,VITAMIN B-12,) 1000 MCG/ML injection, INJECT 1 ML (1,000 MCG TOTAL) INTO THE MUSCLE EVERY 30 (THIRTY) DAYS.  Current Outpatient Medications (Other):  .  blood glucose meter kit and supplies KIT, Dispense based on patient and insurance preference. Use up to four times daily as directed. (FOR ICD-9 250.00, 250.01). .  Cholecalciferol 1000 units capsule, Take 5,000 Units by mouth 2 (two) times daily.  .  Cinnamon 500 MG TABS, Take 2  capsules by mouth 2 (two) times daily. .  Insulin Pen Needle 32G X 6 MM MISC, Use as directed for Levemir Flextouch pen .  Magnesium 250 MG TABS, Take 250 mg by mouth daily. Marland Kitchen  omeprazole (PRILOSEC) 40 MG capsule, TAKE 1 CAPSULE DAILY FOR ACID INDIGESTION (Patient taking differently: Take 40 mg by mouth daily. ) .  potassium chloride (K-DUR) 10 MEQ tablet, Take 1 tablet Daily (Patient taking differently: Take 10 mEq by mouth daily. ) .  pregabalin (LYRICA) 75 MG capsule, Take 1 capsule 3 x /day for Neuropathy Pains (Patient taking differently: Take 75 mg by mouth 3 (three) times daily. For Neuropathy Pains) .  traZODone (DESYREL) 150 MG tablet, TAKE 1/2 TO 1 TABLET 1 HOUR BEFORE BEDTIME (Patient taking differently: Take 150 mg by mouth at bedtime. 1 hour before Bedtime) .  valACYclovir (VALTREX) 1000 MG tablet, Take 1 tablet 3 x day for Fever Blisters / Cold Sores (Patient taking differently: Take 500 mg by mouth 3 (three) times daily as needed (fever blisters/cold sores). ) .  glucose blood test strip, Test blood sugar once  daily  Past Medical History:  Diagnosis Date  . Diabetes mellitus without complication ([place])   . Diverticulitis   . GERD (gastroesophageal reflux disease)   . Hyperlipidemia   . IBS (irritable bowel syndrome)   . Migraine   . Pneumonia 11/2017  . Thyroid disease   . UTI (urinary tract infection)      Allergies  Allergen Reactions  . Tetanus Toxoids Other (See Comments)    Injection site abcess  . Sulfa Antibiotics Hives    itching    ROS: all negative except above.   Physical Exam: Filed Weights   08/09/19 1051  Weight: 163 lb (73.9 kg)   Wt 163 lb (73.9 kg)   SpO2 92%   BMI 29.81 kg/m    General : Fairly well sounding patient, reporting fatigue, in no apparent distress HEENT: no hoarseness, no cough for duration of visit Lungs: speaks in complete sentences, no audible wheezing, no apparent distress Neurological: alert, oriented x 3 Psychiatric: pleasant, judgement appropriate   Caitlin Ribas, NP 12:38 PM [facility]

## 2019-08-09 ENCOUNTER — Encounter: Payer: Self-pay | Admitting: Adult Health

## 2019-08-09 ENCOUNTER — Other Ambulatory Visit: Payer: Self-pay

## 2019-08-09 ENCOUNTER — Ambulatory Visit: Payer: Managed Care, Other (non HMO) | Admitting: Adult Health

## 2019-08-09 VITALS — Wt 163.0 lb

## 2019-08-09 DIAGNOSIS — J9600 Acute respiratory failure, unspecified whether with hypoxia or hypercapnia: Secondary | ICD-10-CM

## 2019-08-09 DIAGNOSIS — E039 Hypothyroidism, unspecified: Secondary | ICD-10-CM

## 2019-08-09 DIAGNOSIS — E1165 Type 2 diabetes mellitus with hyperglycemia: Secondary | ICD-10-CM | POA: Diagnosis not present

## 2019-08-09 DIAGNOSIS — J9601 Acute respiratory failure with hypoxia: Secondary | ICD-10-CM

## 2019-08-09 DIAGNOSIS — R519 Headache, unspecified: Secondary | ICD-10-CM

## 2019-08-09 DIAGNOSIS — U071 COVID-19: Secondary | ICD-10-CM

## 2019-08-09 DIAGNOSIS — I5032 Chronic diastolic (congestive) heart failure: Secondary | ICD-10-CM | POA: Diagnosis not present

## 2019-08-09 DIAGNOSIS — Z794 Long term (current) use of insulin: Secondary | ICD-10-CM

## 2019-08-09 DIAGNOSIS — R0989 Other specified symptoms and signs involving the circulatory and respiratory systems: Secondary | ICD-10-CM

## 2019-08-09 MED ORDER — BUTALBITAL-APAP-CAFFEINE 50-325-40 MG PO TABS: 0.5000 | ORAL_TABLET | ORAL | 0 refills | Status: AC | PRN

## 2019-08-09 MED ORDER — GLUCOSE BLOOD VI STRP: ORAL_STRIP | 12 refills | Status: AC

## 2019-08-09 MED ORDER — GLIPIZIDE 5 MG PO TABS: ORAL_TABLET | ORAL | 1 refills | Status: DC

## 2019-08-09 MED ORDER — DEXAMETHASONE 2 MG PO TABS: ORAL_TABLET | ORAL | 0 refills | Status: DC

## 2019-08-10 NOTE — Telephone Encounter (Signed)
53 y.o. female recently discharged after admission for covid 19 infection called to report new onset bilateral knee pain with blotchy erythema, swelling, severe aching x 12 hours starting last night. Has tried tramadol, motrin, tylenol without improvement. She is tearful but doesn't appear to be in acute distress Denies distal edema; reviewed with Dr. Melford Aase. 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 Per his recommendation communicated advise to increase decadron from 4 mg BID to QID x 5 days. She may present to ED if fever/chills, severe pain not improving. Follow up sooner for evaluation in office 08/15/2018 or sooner than scheduled if negative repeat covid 19 testing results.

## 2019-08-13 NOTE — Progress Notes (Signed)
Assessment and Plan:  Caitlin Barnett was seen today for follow-up.  Diagnoses and all orders for this visit:  Acute respiratory failure due to COVID-19 [facility]) Acute respiratory failure with hypoxia ([place]) Slowly improving with decadron taper; supplement O2 PRN at home if <92% Continue to monitor closely  Discussed and placed follow up CXR for bil pneumonia;  Continue steroid taper; reduce to decadron 4 mg TID x 4 days, BID x 4 days, 2 mg BID x  Consider therapy for decompensation at follow up  Chronic diastolic (congestive) heart failure ([place]) Appears euvolemic;  Encouraged daily monitoring of the patient's weight, call office if 5 lb weight loss or gain in a day.  Encouraged regular exercise. If any increasing shortness of breath, swelling, or chest pressure go to ER immediately.  decrease your fluid intake to less than 2 L daily please remember to always increase your potassium intake with any increase of your fluid pill.   Labile hypertension Continue medication: Monitor blood pressure at home; call if consistently over 130/80 Continue DASH diet.   Reminder to go to the ER if any CP, SOB, nausea, dizziness, severe HA, changes vision/speech, left arm numbness and tingling and jaw pain.  Type 2 diabetes mellitus with hyperglycemia, without long-term current use of insulin (HCC) Continue close monitoring; Continue levemir 14 units while glucose remains elevated on slow decadron taper Continue glipizide 5 mg TID PRN elevated glucose Contact office if needed prior to upcoming follow up Slow taper off of insulin if fasting approaching <130 range  Hypothyroidism, unspecified type continue medications the same pending lab results reminded to take on an empty stomach 30-21mns before food.  check TSH level  Polyarthralgia Check labs, reassuring exam today; suspect r/t recent covid 19 infection, post viral inflammation; improved with decadron, voltaren/lidocaine Monitor  Further disposition  pending results of labs. Discussed med's effects and SE's.   Over 30 minutes of exam, counseling, chart review, and critical decision making was performed.   Future Appointments  Date Time [place] 10/06/2019  3:00 PM MUnk Pinto MD [facility] None    ------------------------------------------------------------------------------------------------------------------   HPI BP 100/64   Pulse 76   Temp (!) 97.2 F (36.2 C)   Wt 166 lb (75.3 kg)   SpO2 97%   BMI 30.36 kg/m   54y.o.female presents for 1 week follow up after recent admission for covid 19 infection with respiratory failure.   History of present illness:  54y.o. WF diabetes mellitus type 2, hyperlipidemia, hypothyroidism, asthma, CHF presented to the ER on 07/25/2019 reporting worsening shortness of breath.  diagnosed with COVID-19 pneumonia about 14 days prior. Patient was rechecked again 07/22/2019 and was positive. She reported 4 day history of dyspnea with right sided pleuritic chest pain, non-productive cough, severe weakness/fatigue and loss of taste, subjective feeling of fever/chills.   ED Course: In the ER CT angiogram of the chest was negative for pulmonary embolism but does show infiltrates concerning for COVID-19.  EKG shows normal sinus rhythm.  Labs show lactic acid of 3.1 which improved with fluids.  CRP 7.8 triglycerides 512 LDH 194 ferritin 116.  Procalcitonin was less than 0.1.  Patient was given IV remdesivir 1 dose of IV Decadron admitted for further management of acute respiratory failure secondary to COVID-19.  Patient blood pressure was in the low normal after received fluids in the ER.  Hospital course:  Patient was admitted for covid 19 pneumonia, acute respiratory failure with hypoxia, R sided pleuritic pain. She was treated  with remdesivir through 12/19, decadron, symptoms did progress further while admitted, discharged on decodron 4 mg BID and home oxygen 2L PRN due to  desaturation to high 80s with ambulation, required as high as 6L while admitted. She was given PRN albuterol, antitussives.   T2DM with hyperglycemia while admitted and on decadron; she was initiated on lantus in addition to metformin/jardiance  Follow up 08/09/2019:  Wt 163 lb (73.9 kg)   SpO2 92%   BMI 29.81 kg/m  She reports still with shortness of breath, chest tightness intermittently when she feel pushes herself too hard; lots of residual fatigue. Feels weak going up and down steps. Wears oxygen 2L [place] PRN during the day, needs after has been up and moving around, wearing 2L at night when sleeping. Monitoring O2 sat, 92% on RA, 95-96% with 2L. Denies cough. She reports ongoing bilateral burning/pleuritic pain bilaterally with deep inspiration. She wonders about need for follow up CXR. She finished last decadron dose this AM, has been taking 6 mg BID, reports this does seem to help significantly. She reported taking metformin with levemir 14 units, very labile values ranging 130-300+ checking TID. Discussed addition of variable dose glipizide due to labile values. She was tentatively planning to get rechecked for covid 19 and return to work gradually the following week if negative.   08/10/2019: called to report new onset bilateral knee pain with blotchy erythema, swelling, severe aching x 12 hours starting previous night. Has tried tramadol, motrin, tylenol without improvement. She was tearful but didn't appear to be in acute distress. Denies distal edema; reviewed with Dr. Melford Aase. 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 She did send photos via mychart which were reviewed. Per Dr. Melford Aase recommendation communicated advise to increase decadron from 4 mg BID to QID x 5 days. Given ED precautions if fever/chills, severe pain not improving. Follow up sooner for evaluation in office 53/11/2018 or sooner than scheduled if negative repeat  covid 19 testing results.   08/16/2019:  BP 100/64   Pulse 76   Temp (!) 97.2 F (36.2 C)   Wt 166 lb (75.3 kg)   SpO2 97%   BMI 30.36 kg/m   She reports on RA 94-95%, with exertion down to 91-92%, breathing is improved;  She feels in general/overall back up to 50-60%, still very poor energy, weak, if gets on floor can't get back up  Endorses ongoing bil chest discomfort; spread from previous R sided pleuritic; She would like to pursue follow up CXR; last 12/13 with worsened bil pneumonia Denies cough  She reports ongoing intermittent bil knee and hip pain; intermittently edematous; sig improved with increased decadron taper; has been taking 4 mg QID per instructions, reduced to TID today and so far without worsening sx; also using voltaren, topical lidocaine patches, reports managing fairly.   Reports home glucose trending down from previous 200-300+ to 150-low 200s; she is monitoring closely and titrating with glipizide PRN  Past Medical History:  Diagnosis Date  . Diabetes mellitus without complication ([place])   . Diverticulitis   . GERD (gastroesophageal reflux disease)   . Hyperlipidemia   . IBS (irritable bowel syndrome)   . Migraine   . Pneumonia 11/2017  . Thyroid disease   . UTI (urinary tract infection)      Allergies  Allergen Reactions  . Tetanus Toxoids Other (See Comments)    Injection site abcess  . Sulfa Antibiotics Hives    itching  Current Outpatient Medications on File Prior to Visit  Medication Sig  . acetaminophen (TYLENOL) 325 MG tablet Take 2 tablets (650 mg total) by mouth every 6 (six) hours as needed for mild pain (or Fever >/= 101).  Marland Kitchen albuterol (PROVENTIL HFA;VENTOLIN HFA) 108 (90 Base) MCG/ACT inhaler 2 inhalations  15-20  minutes apart every 4 hours as needed to rescue Asthma (Patient taking differently: Inhale 2 puffs into the lungs every 4 (four) hours as needed (asthma). Inhale 15-20  minutes apart)  . albuterol (PROVENTIL) (2.5 MG/3ML)  0.083% nebulizer solution Take 3 mLs (2.5 mg total) by nebulization every 6 (six) hours as needed for wheezing or shortness of breath.  Marland Kitchen aspirin EC 81 MG tablet Take 81 mg by mouth daily.  . blood glucose meter kit and supplies KIT Dispense based on patient and insurance preference. Use up to four times daily as directed. (FOR ICD-9 250.00, 250.01).  . butalbital-acetaminophen-caffeine (FIORICET) 50-325-40 MG tablet Take 0.5 tablets by mouth every 4 (four) hours as needed for headache.  . cetirizine (ZYRTEC) 10 MG tablet Take 10 mg by mouth daily.  . Cholecalciferol 1000 units capsule Take 5,000 Units by mouth 2 (two) times daily.   . Cinnamon 500 MG TABS Take 2 capsules by mouth 2 (two) times daily.  . cyanocobalamin (,VITAMIN B-12,) 1000 MCG/ML injection INJECT 1 ML (1,000 MCG TOTAL) INTO THE MUSCLE EVERY 30 (THIRTY) DAYS.  Marland Kitchen empagliflozin (JARDIANCE) 25 MG TABS tablet Take 1 tablet Daily for Diabetes (Patient taking differently: Take 25 mg by mouth daily. )  . estrogen, conjugated,-medroxyprogesterone (PREMPRO) 0.45-1.5 MG tablet TAKE 1 TABLET BY MOUTH EVERY DAY (Patient taking differently: Take 1 tablet by mouth daily. )  . fenofibrate (TRICOR) 145 MG tablet Take 1 tablet Daily for Triglycerides (Blood Fats) (Patient taking differently: Take 145 mg by mouth daily. )  . furosemide (LASIX) 40 MG tablet TAKE 40 MG (1 TAB) BY MOUTH DAILY. MAY TAKE 1/2 TAB ON MOST DAYS AND 1 FULL TAB DAILY WHEN NEEDED. (Patient taking differently: Take 20-40 mg by mouth See admin instructions. May take '20mg'$  on most days and '40mg'$  daily when needed.)  . glipiZIDE (GLUCOTROL) 5 MG tablet Take 1/2- 1 tab three times a day as needed with meals for diabetes.  Marland Kitchen glucose blood test strip Test blood sugar once daily  . ibuprofen (ADVIL,MOTRIN) 200 MG tablet Take 800 mg by mouth as needed for moderate pain.  . Insulin Detemir (LEVEMIR FLEXTOUCH) 100 UNIT/ML Pen Inject 14 Units into the skin daily for 20 days.  . Insulin Pen  Needle 32G X 6 MM MISC Use as directed for Levemir Flextouch pen  . levothyroxine (SYNTHROID) 125 MCG tablet Take 1 tablet daily on an empty stomach with only water for 30 minutes & no Antacid meds, Calcium or Magnesium for 4 hours & avoid Biotin (Patient taking differently: Take 125 mcg by mouth daily before breakfast. On an empty stomach with only water for 30 minutes & no Antacid meds, Calcium or Magnesium for 4 hours & avoid Biotin)  . Magnesium 250 MG TABS Take 250 mg by mouth daily.  . metFORMIN (GLUCOPHAGE-XR) 500 MG 24 hr tablet TAKE 2 TABLETS BY MOUTH TWICE A DAY FOR DIABETES  . montelukast (SINGULAIR) 10 MG tablet TAKE 1 TABLET DAILY FOR ALLERGIES (Patient taking differently: Take 10 mg by mouth daily. )  . omeprazole (PRILOSEC) 40 MG capsule TAKE 1 CAPSULE DAILY FOR ACID INDIGESTION (Patient taking differently: Take 40 mg by mouth daily. )  .  potassium chloride (K-DUR) 10 MEQ tablet Take 1 tablet Daily (Patient taking differently: Take 10 mEq by mouth daily. )  . pravastatin (PRAVACHOL) 20 MG tablet Take 1 tablet at Bedtime for Cholesterol (Patient taking differently: Take 20 mg by mouth at bedtime. )  . pregabalin (LYRICA) 75 MG capsule Take 1 capsule 3 x /day for Neuropathy Pains (Patient taking differently: Take 75 mg by mouth 3 (three) times daily. For Neuropathy Pains)  . traMADol (ULTRAM) 50 MG tablet Take 2 tablets (100 mg total) by mouth every 6 (six) hours as needed for up to 12 doses.  . traZODone (DESYREL) 150 MG tablet TAKE 1/2 TO 1 TABLET 1 HOUR BEFORE BEDTIME (Patient taking differently: Take 150 mg by mouth at bedtime. 1 hour before Bedtime)  . valACYclovir (VALTREX) 1000 MG tablet Take 1 tablet 3 x day for Fever Blisters / Cold Sores (Patient taking differently: Take 500 mg by mouth 3 (three) times daily as needed (fever blisters/cold sores). )   No current facility-administered medications on file prior to visit.    ROS: all negative except above.   Physical Exam:  BP  100/64   Pulse 76   Temp (!) 97.2 F (36.2 C)   Wt 166 lb (75.3 kg)   SpO2 97%   BMI 30.36 kg/m   General Appearance: Well nourished, well dressed female patient, in no acute distress. Eyes: PERRLA, conjunctiva no swelling or erythema Sinuses: No Frontal/maxillary tenderness ENT/Mouth: Ext aud canals clear, TMs without erythema, bulging. No erythema, swelling, or exudate on post pharynx.  Tonsils not swollen or erythematous. Hearing normal.  Neck: Supple, thyroid normal.  Respiratory: Respiratory effort normal, BS equal bilaterally without rales, rhonchi, wheezing or stridor.  Cardio: RRR with no MRGs. Brisk peripheral pulses without edema.  Abdomen: Soft, + BS.  Non tender, no guarding, rebound, hernias, masses. Lymphatics: Non tender without lymphadenopathy.  Musculoskeletal: No obvious deformity knees or hips, full ROM without heat, effusion; slow steady gait. Slow sitting to standing, needs assistance on exam table. No tenderness.  Skin: Warm, dry without rashes, lesions, ecchymosis.  Neuro: Normal muscle tone, Sensation intact.  Psych: Awake and oriented X 3, normal affect, Insight and Judgment appropriate.     [name], NP 6:25 PM [facility]

## 2019-08-16 ENCOUNTER — Other Ambulatory Visit: Payer: Self-pay

## 2019-08-16 ENCOUNTER — Ambulatory Visit (INDEPENDENT_AMBULATORY_CARE_PROVIDER_SITE_OTHER): Payer: Managed Care, Other (non HMO) | Admitting: Adult Health

## 2019-08-16 ENCOUNTER — Encounter: Payer: Self-pay | Admitting: Adult Health

## 2019-08-16 VITALS — BP 100/64 | HR 76 | Temp 97.2°F | Wt 166.0 lb

## 2019-08-16 DIAGNOSIS — M2550 Pain in unspecified joint: Secondary | ICD-10-CM | POA: Diagnosis not present

## 2019-08-16 DIAGNOSIS — U071 COVID-19: Secondary | ICD-10-CM

## 2019-08-16 DIAGNOSIS — R0989 Other specified symptoms and signs involving the circulatory and respiratory systems: Secondary | ICD-10-CM

## 2019-08-16 DIAGNOSIS — E039 Hypothyroidism, unspecified: Secondary | ICD-10-CM

## 2019-08-16 DIAGNOSIS — E1165 Type 2 diabetes mellitus with hyperglycemia: Secondary | ICD-10-CM

## 2019-08-16 DIAGNOSIS — J9601 Acute respiratory failure with hypoxia: Secondary | ICD-10-CM | POA: Diagnosis not present

## 2019-08-16 DIAGNOSIS — I5032 Chronic diastolic (congestive) heart failure: Secondary | ICD-10-CM | POA: Diagnosis not present

## 2019-08-16 DIAGNOSIS — J9600 Acute respiratory failure, unspecified whether with hypoxia or hypercapnia: Secondary | ICD-10-CM

## 2019-08-16 DIAGNOSIS — R079 Chest pain, unspecified: Secondary | ICD-10-CM

## 2019-08-16 MED ORDER — DEXAMETHASONE 2 MG PO TABS: ORAL_TABLET | ORAL | 0 refills | Status: DC

## 2019-08-16 NOTE — Patient Instructions (Addendum)
Order is in for Chest xray   Can walk in to [facility] radiology at any time to get this done

## 2019-08-17 ENCOUNTER — Other Ambulatory Visit: Payer: Self-pay | Admitting: Adult Health

## 2019-08-17 DIAGNOSIS — E039 Hypothyroidism, unspecified: Secondary | ICD-10-CM

## 2019-08-17 LAB — COMPLETE METABOLIC PANEL WITH GFR
AG Ratio: 1.8 (calc) (ref 1.0–2.5)
ALT: 50 U/L — ABNORMAL HIGH (ref 6–29)
AST: 23 U/L (ref 10–35)
Albumin: 4.2 g/dL (ref 3.6–5.1)
Alkaline phosphatase (APISO): 72 U/L (ref 37–153)
BUN/Creatinine Ratio: 46 (calc) — ABNORMAL HIGH (ref 6–22)
BUN: 32 mg/dL — ABNORMAL HIGH (ref 7–25)
CO2: 24 mmol/L (ref 20–32)
Calcium: 10.8 mg/dL — ABNORMAL HIGH (ref 8.6–10.4)
Chloride: 100 mmol/L (ref 98–110)
Creat: 0.7 mg/dL (ref 0.50–1.05)
GFR, Est African American: 115 mL/min/{1.73_m2} (ref >=60)
GFR, Est Non African American: 100 mL/min/{1.73_m2} (ref >=60)
Globulin: 2.3 g/dL (calc) (ref 1.9–3.7)
Glucose, Bld: 154 mg/dL — ABNORMAL HIGH (ref 65–99)
Potassium: 4.6 mmol/L (ref 3.5–5.3)
Sodium: 138 mmol/L (ref 135–146)
Total Bilirubin: 0.4 mg/dL (ref 0.2–1.2)
Total Protein: 6.5 g/dL (ref 6.1–8.1)

## 2019-08-17 LAB — CBC WITH DIFFERENTIAL/PLATELET
Absolute Monocytes: 568 cells/uL (ref 200–950)
Basophils Absolute: 8 cells/uL (ref 0–200)
Basophils Relative: 0.1 %
Eosinophils Absolute: 40 cells/uL (ref 15–500)
Eosinophils Relative: 0.5 %
HCT: 43.9 % (ref 35.0–45.0)
Hemoglobin: 14.8 g/dL (ref 11.7–15.5)
Lymphs Abs: 1328 cells/uL (ref 850–3900)
MCH: 30.8 pg (ref 27.0–33.0)
MCHC: 33.7 g/dL (ref 32.0–36.0)
MCV: 91.5 fL (ref 80.0–100.0)
MPV: 10 fL (ref 7.5–12.5)
Monocytes Relative: 7.1 %
Neutro Abs: 6056 cells/uL (ref 1500–7800)
Neutrophils Relative %: 75.7 %
Platelets: 287 10*3/uL (ref 140–400)
RBC: 4.8 10*6/uL (ref 3.80–5.10)
RDW: 13.3 % (ref 11.0–15.0)
Total Lymphocyte: 16.6 %
WBC: 8 10*3/uL (ref 3.8–10.8)

## 2019-08-17 LAB — C-REACTIVE PROTEIN: CRP: 5.3 mg/L (ref <8.0)

## 2019-08-17 LAB — TSH: TSH: 0.18 mIU/L — ABNORMAL LOW

## 2019-08-17 LAB — SEDIMENTATION RATE: Sed Rate: 2 mm/h (ref 0–30)

## 2019-08-17 MED ORDER — LEVOTHYROXINE SODIUM 125 MCG PO TABS: ORAL_TABLET | ORAL | 3 refills | Status: DC

## 2019-08-18 ENCOUNTER — Other Ambulatory Visit: Payer: Self-pay

## 2019-08-18 ENCOUNTER — Ambulatory Visit (HOSPITAL_COMMUNITY)
Admission: RE | Admit: 2019-08-18 | Discharge: 2019-08-18 | Disposition: A | Discharge status: Home / Self Care | Payer: Managed Care, Other (non HMO) | Source: Ambulatory Visit | Attending: Adult Health | Admitting: Adult Health

## 2019-08-18 DIAGNOSIS — U071 COVID-19: Secondary | ICD-10-CM | POA: Diagnosis not present

## 2019-08-18 DIAGNOSIS — J9600 Acute respiratory failure, unspecified whether with hypoxia or hypercapnia: Secondary | ICD-10-CM | POA: Insufficient documentation

## 2019-08-18 DIAGNOSIS — R079 Chest pain, unspecified: Secondary | ICD-10-CM | POA: Diagnosis present

## 2019-08-20 ENCOUNTER — Other Ambulatory Visit: Payer: Self-pay | Admitting: Cardiology

## 2019-08-24 ENCOUNTER — Other Ambulatory Visit: Payer: Self-pay

## 2019-08-24 MED ORDER — LEVEMIR FLEXTOUCH 100 UNIT/ML ~~LOC~~ SOPN: 14.0000 [IU] | PEN_INJECTOR | Freq: Every day | SUBCUTANEOUS | 0 refills | Status: DC

## 2019-08-26 NOTE — Progress Notes (Signed)
Assessment and Plan:  Caitlin Barnett was seen today for follow-up.  Diagnoses and all orders for this visit:  Acute respiratory failure due to COVID-19 [facility]) Acute respiratory failure with hypoxia ([place]) Slowly improving with decadron taper; off of supplement O2 Continue to monitor closely  CXR shows pneumonia has resolved  Continue steroid taper; 1 mg BID x 5 days, then 1 mg daily x 5 days, 0.5 mg daily x 1 week, then every other day for 10 days if needed.  She is scheduled for PT via ortho  Chronic diastolic (congestive) heart failure ([place]) ? Mild fluid retention, abdominal; she will increase lasix from 20 mg to alteranting with 40 mg every other day x 5 days then resume 20 mg daily  Encouraged daily monitoring of the patient's weight, call office if 3 lb weight loss or gain in a day.  Encouraged regular exercise. If any increasing shortness of breath, swelling, or chest pressure go to ER immediately.  decrease your fluid intake to less than 2 L daily please remember to always increase your potassium intake with any increase of your fluid pill.   Labile hypertension Continue medication Monitor blood pressure at home; call if consistently over 130/80 Continue DASH diet.   Reminder to go to the ER if any CP, SOB, nausea, dizziness, severe HA, changes vision/speech, left arm numbness and tingling and jaw pain.  Type 2 diabetes mellitus with hyperglycemia, without long-term current use of insulin (HCC) Continue close monitoring; Continue levemir 8 units while glucose remains elevated on slow decadron taper Goal to improve fasting to 70-130 range; peak glucose 180 Continue glipizide 5 mg TID PRN elevated glucose Contact office if needed prior to upcoming follow up Slow taper off of insulin if fasting approaching <130 range  Polyarthralgia Has seen ortho; improving with decadron and mobic; will start PT next week  Further disposition pending results of labs. Discussed med's effects and SE's.    Over 30 minutes of exam, counseling, chart review, and critical decision making was performed.   Future Appointments  Date Time Provider [place]  10/06/2019  3:00 PM Unk Pinto, MD [facility] None    ------------------------------------------------------------------------------------------------------------------   HPI BP 100/60   Pulse 85   Temp 97.7 F (36.5 C)   Wt 172 lb (78 kg)   SpO2 97%   BMI 31.46 kg/m   53 y.o.female presents for 1 week follow up after recent admission for covid 19 infection with respiratory failure.   History of present illness:  54 y.o. WF diabetes mellitus type 2, hyperlipidemia, hypothyroidism, asthma, CHF presented to the ER on 07/25/2019 reporting worsening shortness of breath.  diagnosed with COVID-19 pneumonia about 14 days prior. Patient was rechecked again 07/22/2019 and was positive. She reported 4 day history of dyspnea with right sided pleuritic chest pain, non-productive cough, severe weakness/fatigue and loss of taste, subjective feeling of fever/chills.   ED Course: In the ER CT angiogram of the chest was negative for pulmonary embolism but does show infiltrates concerning for COVID-19.  EKG shows normal sinus rhythm.  Labs show lactic acid of 3.1 which improved with fluids.  CRP 7.8 triglycerides 512 LDH 194 ferritin 116.  Procalcitonin was less than 0.1.  Patient was given IV remdesivir 1 dose of IV Decadron admitted for further management of acute respiratory failure secondary to COVID-19.  Patient blood pressure was in the low normal after received fluids in the ER.  Hospital course:  Patient was admitted for covid 19 pneumonia, acute respiratory failure with  hypoxia, R sided pleuritic pain. She was treated with remdesivir through 12/19, decadron, symptoms did progress further while admitted, discharged on decodron 4 mg BID and home oxygen 2L PRN due to desaturation to high 80s with ambulation, required as high as 6L while  admitted. She was given PRN albuterol, antitussives.   T2DM with hyperglycemia while admitted and on decadron; she was initiated on lantus in addition to metformin/jardiance.   Follow up 08/09/2019:  Wt 163 lb (73.9 kg)   SpO2 92%   BMI 29.81 kg/m  She reports still with shortness of breath, chest tightness intermittently when she feel pushes herself too hard; lots of residual fatigue. Feels weak going up and down steps. Wears oxygen 2L [place] PRN during the day, needs after has been up and moving around, wearing 2L at night when sleeping. Monitoring O2 sat, 92% on RA, 95-96% with 2L. Denies cough. She reports ongoing bilateral burning/pleuritic pain bilaterally with deep inspiration. She wonders about need for follow up CXR. She finished last decadron dose this AM, has been taking 6 mg BID, reports this does seem to help significantly. She reported taking metformin with levemir 14 units, very labile values ranging 130-300+ checking TID. Discussed addition of variable dose glipizide due to labile values. She was tentatively planning to get rechecked for covid 19 and return to work gradually the following week if negative.   08/10/2019: called to report new onset bilateral knee pain with blotchy erythema, swelling, severe aching x 12 hours starting previous night. Has tried tramadol, motrin, tylenol without improvement. She was tearful but didn't appear to be in acute distress. Denies distal edema; reviewed with Dr. Melford Aase. 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 She did send photos via mychart which were reviewed. Per Dr. Melford Aase recommendation communicated advise to increase decadron from 4 mg BID to QID x 5 days. Given ED precautions if fever/chills, severe pain not improving. Follow up sooner for evaluation in office 08/15/2018 or sooner than scheduled if negative repeat covid 19 testing results.   08/16/2019:  She reports on RA 94-95%, with  exertion down to 91-92%, breathing is improved;  She feels in general/overall back up to 50-60%, still very poor energy, weak, if gets on floor can't get back up  Endorses ongoing bil chest discomfort; spread from previous R sided pleuritic; She would like to pursue follow up CXR; last 12/13 with worsened bil pneumonia Denies cough.  She reports ongoing intermittent bil knee and hip pain; intermittently edematous; sig improved with increased decadron taper; has been taking 4 mg QID per instructions, reduced to TID today and so far without worsening sx; also using voltaren, topical lidocaine patches, reports managing fairly.   Reports home glucose trending down from previous 200-300+ to 150-low 200s; she is monitoring closely and titrating with glipizide PRN  08/27/2019:  BP 100/60   Pulse 85   Temp 97.7 F (36.5 C)   Wt 172 lb (78 kg)   SpO2 97%   BMI 31.46 kg/m   08/18/2019 CXR shows previously seen bibasilar predominant pulmonary infiltrates have resolved. No evidence of pleural effusion.  She presents unaccompanied today. She reports she is doing much better.   O2 at home - she has O2 but reports hasn't needed in 1 week. O2 has been stable around 97%. Plans to return O2 soon.   Have continued to taper decadron, currently down to decadron 2 mg BID for one more day and will reduce to 1  mg BID for 5 days.   Has seen ortho for knee; normal xrays, feels muscular weakness, would most benefit from PT, has coordinated PT to begin next week. Mobic for knees and hip bursitis has been beneficial.   She has been able to taper down on levemir from 14 units to 8 units, reports average fasting has been 150 with occasional spike up to 200.   She has a history of Diastolic Denies orthopnea, paroxysmal nocturnal dyspnea and edema. Positive for fatigue and mild exertional dyspnea. She has been taking lasix 20 mg daily.  Wt Readings from Last 3 Encounters:  08/27/19 172 lb (78 kg)  08/16/19 166 lb  (75.3 kg)  08/09/19 163 lb (73.9 kg)     Past Medical History:  Diagnosis Date  . Diabetes mellitus without complication ([place])   . Diverticulitis   . GERD (gastroesophageal reflux disease)   . Hyperlipidemia   . IBS (irritable bowel syndrome)   . Migraine   . Pneumonia 11/2017  . Thyroid disease   . UTI (urinary tract infection)      Allergies  Allergen Reactions  . Tetanus Toxoids Other (See Comments)    Injection site abcess  . Sulfa Antibiotics Hives    itching    Current Outpatient Medications on File Prior to Visit  Medication Sig  . acetaminophen (TYLENOL) 325 MG tablet Take 2 tablets (650 mg total) by mouth every 6 (six) hours as needed for mild pain (or Fever >/= 101).  Marland Kitchen albuterol (PROVENTIL HFA;VENTOLIN HFA) 108 (90 Base) MCG/ACT inhaler 2 inhalations  15-20  minutes apart every 4 hours as needed to rescue Asthma (Patient taking differently: Inhale 2 puffs into the lungs every 4 (four) hours as needed (asthma). Inhale 15-20  minutes apart)  . albuterol (PROVENTIL) (2.5 MG/3ML) 0.083% nebulizer solution Take 3 mLs (2.5 mg total) by nebulization every 6 (six) hours as needed for wheezing or shortness of breath.  Marland Kitchen aspirin EC 81 MG tablet Take 81 mg by mouth daily.  . blood glucose meter kit and supplies KIT Dispense based on patient and insurance preference. Use up to four times daily as directed. (FOR ICD-9 250.00, 250.01).  . butalbital-acetaminophen-caffeine (FIORICET) 50-325-40 MG tablet Take 0.5 tablets by mouth every 4 (four) hours as needed for headache.  . cetirizine (ZYRTEC) 10 MG tablet Take 10 mg by mouth daily.  . Cholecalciferol 1000 units capsule Take 5,000 Units by mouth 2 (two) times daily.   . Cinnamon 500 MG TABS Take 2 capsules by mouth 2 (two) times daily.  . cyanocobalamin (,VITAMIN B-12,) 1000 MCG/ML injection INJECT 1 ML (1,000 MCG TOTAL) INTO THE MUSCLE EVERY 30 (THIRTY) DAYS.  Marland Kitchen empagliflozin (JARDIANCE) 25 MG TABS tablet Take 1 tablet Daily for  Diabetes (Patient taking differently: Take 25 mg by mouth daily. )  . estrogen, conjugated,-medroxyprogesterone (PREMPRO) 0.45-1.5 MG tablet TAKE 1 TABLET BY MOUTH EVERY DAY (Patient taking differently: Take 1 tablet by mouth daily. )  . fenofibrate (TRICOR) 145 MG tablet Take 1 tablet Daily for Triglycerides (Blood Fats) (Patient taking differently: Take 145 mg by mouth daily. )  . furosemide (LASIX) 40 MG tablet TAKE 40 MG (1 TAB) BY MOUTH DAILY. MAY TAKE 1/2 TAB ON MOST DAYS AND 1 FULL TAB DAILY WHEN NEEDED.  Marland Kitchen glipiZIDE (GLUCOTROL) 5 MG tablet Take 1/2- 1 tab three times a day as needed with meals for diabetes.  Marland Kitchen glucose blood test strip Test blood sugar once daily  . ibuprofen (ADVIL,MOTRIN) 200 MG tablet  Take 800 mg by mouth as needed for moderate pain.  . Insulin Detemir (LEVEMIR FLEXTOUCH) 100 UNIT/ML Pen Inject 14 Units into the skin daily for 20 days.  . Insulin Pen Needle 32G X 6 MM MISC Use as directed for Levemir Flextouch pen  . levothyroxine (SYNTHROID) 125 MCG tablet Take 1 tablet daily except 1/2 tab on Sunday; take on an empty stomach with only water for 30 minutes & no Antacid meds, Calcium or Magnesium for 4 hours & avoid Biotin  . Magnesium 250 MG TABS Take 250 mg by mouth daily.  . meloxicam (MOBIC) 15 MG tablet Take 15 mg by mouth 2 (two) times daily.  . metFORMIN (GLUCOPHAGE-XR) 500 MG 24 hr tablet TAKE 2 TABLETS BY MOUTH TWICE A DAY FOR DIABETES  . montelukast (SINGULAIR) 10 MG tablet TAKE 1 TABLET DAILY FOR ALLERGIES (Patient taking differently: Take 10 mg by mouth daily. )  . omeprazole (PRILOSEC) 40 MG capsule TAKE 1 CAPSULE DAILY FOR ACID INDIGESTION (Patient taking differently: Take 40 mg by mouth daily. )  . potassium chloride (K-DUR) 10 MEQ tablet Take 1 tablet Daily (Patient taking differently: Take 10 mEq by mouth daily. )  . pravastatin (PRAVACHOL) 20 MG tablet Take 1 tablet at Bedtime for Cholesterol (Patient taking differently: Take 20 mg by mouth at bedtime. )   . pregabalin (LYRICA) 75 MG capsule Take 1 capsule 3 x /day for Neuropathy Pains (Patient taking differently: Take 75 mg by mouth 3 (three) times daily. For Neuropathy Pains)  . traMADol (ULTRAM) 50 MG tablet Take 2 tablets (100 mg total) by mouth every 6 (six) hours as needed for up to 12 doses.  . traZODone (DESYREL) 150 MG tablet TAKE 1/2 TO 1 TABLET 1 HOUR BEFORE BEDTIME (Patient taking differently: Take 150 mg by mouth at bedtime. 1 hour before Bedtime)  . valACYclovir (VALTREX) 1000 MG tablet Take 1 tablet 3 x day for Fever Blisters / Cold Sores (Patient taking differently: Take 500 mg by mouth 3 (three) times daily as needed (fever blisters/cold sores). )   No current facility-administered medications on file prior to visit.    ROS: all negative except above.   Physical Exam:  BP 100/60   Pulse 85   Temp 97.7 F (36.5 C)   Wt 172 lb (78 kg)   SpO2 97%   BMI 31.46 kg/m   General Appearance: Well nourished, well dressed female patient, in no acute distress. Eyes: PERRLA, conjunctiva no swelling or erythema Sinuses: No Frontal/maxillary tenderness ENT/Mouth: Ext aud canals clear, TMs without erythema, bulging. Mask in place; oral exam deferred. Hearing normal.  Neck: Supple, thyroid normal.  Respiratory: Respiratory effort normal, BS equal bilaterally without rales, rhonchi, wheezing or stridor.  Cardio: RRR with no MRGs. Brisk peripheral pulses without edema.  Abdomen: Soft, + BS.  Non tender, no guarding, rebound, hernias, masses. Lymphatics: Non tender without lymphadenopathy.  Musculoskeletal: No obvious deformity knees or hips, full ROM without heat, effusion; slow steady gait. Slow sitting to standing, needs assistance on exam table. No tenderness.  Skin: Warm, dry without rashes, lesions, ecchymosis.  Neuro: Normal muscle tone, Sensation intact.  Psych: Awake and oriented X 3, normal affect, Insight and Judgment appropriate.     [name], NP 9:35  AM [facility]

## 2019-08-27 ENCOUNTER — Ambulatory Visit: Payer: Managed Care, Other (non HMO) | Admitting: Adult Health

## 2019-08-27 ENCOUNTER — Other Ambulatory Visit: Payer: Self-pay

## 2019-08-27 ENCOUNTER — Encounter: Payer: Self-pay | Admitting: Adult Health

## 2019-08-27 VITALS — BP 100/60 | HR 85 | Temp 97.7°F | Wt 172.0 lb

## 2019-08-27 DIAGNOSIS — J9600 Acute respiratory failure, unspecified whether with hypoxia or hypercapnia: Secondary | ICD-10-CM

## 2019-08-27 DIAGNOSIS — R0989 Other specified symptoms and signs involving the circulatory and respiratory systems: Secondary | ICD-10-CM

## 2019-08-27 DIAGNOSIS — U071 COVID-19: Secondary | ICD-10-CM

## 2019-08-27 DIAGNOSIS — I5032 Chronic diastolic (congestive) heart failure: Secondary | ICD-10-CM

## 2019-08-27 DIAGNOSIS — E1165 Type 2 diabetes mellitus with hyperglycemia: Secondary | ICD-10-CM

## 2019-08-27 DIAGNOSIS — J9601 Acute respiratory failure with hypoxia: Secondary | ICD-10-CM

## 2019-08-27 MED ORDER — DEXAMETHASONE 1 MG PO TABS: ORAL_TABLET | ORAL | 1 refills | Status: DC

## 2019-08-27 NOTE — Patient Instructions (Addendum)
Goals    . HEMOGLOBIN A1C < 7.0    . LDL CALC < 70    . Weight (lb) < 155 lb (70.3 kg)      Goal to gradually improve glucose down to 70-130 for fasting Peak up to 180 if possible      Individuals seeking information about the vaccines and state's phased distribution plan can learn more by going to - [email or web address]  -[email or web address]   And continue to monitor the county's website and press releases.   And you can call the [facility] health department starting on Jan 8th at 8 AM to schedule for a vaccine. [phone].

## 2019-08-28 ENCOUNTER — Encounter: Payer: Self-pay | Admitting: Adult Health

## 2019-09-23 ENCOUNTER — Other Ambulatory Visit: Payer: Self-pay | Admitting: Internal Medicine

## 2019-09-23 DIAGNOSIS — E1140 Type 2 diabetes mellitus with diabetic neuropathy, unspecified: Secondary | ICD-10-CM

## 2019-10-05 ENCOUNTER — Encounter: Payer: Self-pay | Admitting: Internal Medicine

## 2019-10-05 NOTE — Patient Instructions (Signed)

## 2019-10-05 NOTE — Progress Notes (Signed)
Annual Screening/Preventative Visit & Comprehensive Evaluation &  Examination     This very nice 54 y.o. MWF  presents for a Screening /Preventative Visit & comprehensive evaluation and management of multiple medical co-morbidities.  Patient has been followed for HTN, HLD, T2_NIDDM  and Vitamin D Deficiency. Patient's GERD is controlled on her meds.     Patient was hospitalized Dec 13- Dec 20 with Covid Pneumonia and Acute respiratory failure and fortunately never required mechanical ventilation. She was treated with Remdesivir & Decadron with the later exascerbating her Diabetes necessitating add'n of Lantus which was post-hospital  transitioned to Levemir and since stopped Metformin & Jardiance were added back to her regimen at discharge. PRN Glipizide was also stopped.  She continued to require nasal O2 as her Decadron was tapered. She was finally weaned from O2 by mid January.       Patient has hx/o labile HTN monitored expectantly. Patient's BP has been controlled at home and patient denies any cardiac symptoms as chest pain, palpitations, shortness of breath, dizziness or ankle swelling. Today's BP is at goal -116/64.      Patient's hyperlipidemia is controlled with diet and medications. Patient denies myalgias or other medication SE's. Last lipids were at goal except very elevated Trig's:  Lab Results  Component Value Date   CHOL 103 08/20/2018   HDL 56 08/20/2018   LDLCALC 20 08/20/2018   TRIG 512 (H) 07/25/2019   CHOLHDL 1.8 08/20/2018       Patient has hx/o Gestational Diabetes ([place]), then honeymooned until relapsing developing T2_DM in 2016 and she was started on Metformin for Diabetic control.  Patient denies reactive hypoglycemic symptoms, visual blurring or diabetic polys,. But does have burning dysthesias of her legs/feet. Last A1c was not at goal: Lab Results  Component Value Date   HGBA1C 6.7 (H) 08/20/2018       Patient was dx'd Hypothyroid at age 40 yo in 83  & has been on thyroid replacement since.      Finally, patient has history of Vitamin D Deficiency ("8" / 2018)  and last Vitamin D was still slightly low:  Lab Results  Component Value Date   VD25OH 48 08/20/2018    Current Outpatient Medications on File Prior to Visit  Medication Sig  . albuterol (PROVENTIL HFA;VENTOLIN HFA) 108 (90 Base) MCG/ACT inhaler 2 inhalations  15-20  minutes apart every 4 hours as needed to rescue Asthma (Patient taking differently: Inhale 2 puffs into the lungs every 4 (four) hours as needed (asthma). Inhale 15-20  minutes apart)  . albuterol (PROVENTIL) (2.5 MG/3ML) 0.083% nebulizer solution Take 3 mLs (2.5 mg total) by nebulization every 6 (six) hours as needed for wheezing or shortness of breath.  Marland Kitchen aspirin EC 81 MG tablet Take 81 mg by mouth daily.  . blood glucose meter kit and supplies KIT Dispense based on patient and insurance preference. Use up to four times daily as directed. (FOR ICD-9 250.00, 250.01).  . butalbital-acetaminophen-caffeine (FIORICET) 50-325-40 MG tablet Take 0.5 tablets by mouth every 4 (four) hours as needed for headache.  . cetirizine (ZYRTEC) 10 MG tablet Take 10 mg by mouth daily.  . Cholecalciferol 1000 units capsule Take 5,000 Units by mouth 2 (two) times daily.   . Cinnamon 500 MG TABS Take 2 capsules by mouth 2 (two) times daily.  . cyanocobalamin (,VITAMIN B-12,) 1000 MCG/ML injection INJECT 1 ML (1,000 MCG TOTAL) INTO THE MUSCLE EVERY 30 (THIRTY) DAYS.  Marland Kitchen empagliflozin (JARDIANCE) 25 MG  TABS tablet Take 1 tablet Daily for Diabetes (Patient taking differently: Take 25 mg by mouth daily. )  . estrogen, conjugated,-medroxyprogesterone (PREMPRO) 0.45-1.5 MG tablet TAKE 1 TABLET BY MOUTH EVERY DAY (Patient taking differently: Take 1 tablet by mouth daily. )  . fenofibrate (TRICOR) 145 MG tablet Take 1 tablet Daily for Triglycerides (Blood Fats) (Patient taking differently: Take 145 mg by mouth daily. )  . furosemide (LASIX) 40 MG  tablet TAKE 40 MG (1 TAB) BY MOUTH DAILY. MAY TAKE 1/2 TAB ON MOST DAYS AND 1 FULL TAB DAILY WHEN NEEDED.  Marland Kitchen glipiZIDE (GLUCOTROL) 5 MG tablet Take 1/2- 1 tab three times a day as needed with meals for diabetes.  Marland Kitchen glucose blood test strip Test blood sugar once daily  . ibuprofen (ADVIL,MOTRIN) 200 MG tablet Take 800 mg by mouth as needed for moderate pain.  Marland Kitchen levothyroxine (SYNTHROID) 125 MCG tablet Take 1 tablet daily except 1/2 tab on Sunday; take on an empty stomach with only water for 30 minutes & no Antacid meds, Calcium or Magnesium for 4 hours & avoid Biotin  . Magnesium 250 MG TABS Take 250 mg by mouth daily.  . metFORMIN (GLUCOPHAGE-XR) 500 MG 24 hr tablet TAKE 2 TABLETS BY MOUTH TWICE A DAY FOR DIABETES  . montelukast (SINGULAIR) 10 MG tablet TAKE 1 TABLET DAILY FOR ALLERGIES (Patient taking differently: Take 10 mg by mouth daily. )  . omeprazole (PRILOSEC) 40 MG capsule TAKE 1 CAPSULE DAILY FOR ACID INDIGESTION (Patient taking differently: Take 40 mg by mouth daily. )  . potassium chloride (K-DUR) 10 MEQ tablet Take 1 tablet Daily (Patient taking differently: Take 10 mEq by mouth daily. )  . pravastatin (PRAVACHOL) 20 MG tablet Take 1 tablet at Bedtime for Cholesterol (Patient taking differently: Take 20 mg by mouth at bedtime. )  . pregabalin (LYRICA) 75 MG capsule Take 1 capsule 3 x /day for Neuropathy Pains (Patient taking differently: Take 75 mg by mouth 3 (three) times daily. For Neuropathy Pains)  . traZODone (DESYREL) 150 MG tablet TAKE 1/2 TO 1 TABLET 1 HOUR BEFORE BEDTIME (Patient taking differently: Take 150 mg by mouth at bedtime. 1 hour before Bedtime)  . valACYclovir (VALTREX) 1000 MG tablet Take 1 tablet 3 x day for Fever Blisters / Cold Sores (Patient taking differently: Take 500 mg by mouth 3 (three) times daily as needed (fever blisters/cold sores). )   No current facility-administered medications on file prior to visit.   Allergies  Allergen Reactions  . Tetanus  Toxoids Other (See Comments)    Injection site abcess  . Sulfa Antibiotics Hives    itching   Past Medical History:  Diagnosis Date  . Diabetes mellitus without complication ([place])   . Diverticulitis   . GERD (gastroesophageal reflux disease)   . Hyperlipidemia   . IBS (irritable bowel syndrome)   . Migraine   . Pneumonia 11/2017  . Thyroid disease   . UTI (urinary tract infection)    Health Maintenance  Topic Date Due  . OPHTHALMOLOGY EXAM  03/22/1977  . MAMMOGRAM  03/22/2017  . COLONOSCOPY  03/22/2017  . HEMOGLOBIN A1C  02/18/2019  . INFLUENZA VACCINE  03/13/2019  . URINE MICROALBUMIN  08/21/2019  . PAP SMEAR-Modifier  02/25/2020  . FOOT EXAM  10/05/2020  . PNEUMOCOCCAL POLYSACCHARIDE VACCINE AGE 53-64 HIGH RISK  Completed  . HIV Screening  Completed  . TETANUS/TDAP  Discontinued   Immunization History  Administered Date(s) Administered  . Influenza Inj Mdck Quad With Preservative  07/22/2017, 05/14/2018  . Pneumococcal Polysaccharide-23 08/20/2018    Last Colon - 2016 in [place] - dx IBS - recc f/u 5 yrs due 2021.   Last MGM - overdue since in [place]  Past Surgical History:  Procedure Laterality Date  . CESAREAN SECTION  1993  . cystoscopies     multiple  . FUNCTIONAL ENDOSCOPIC SINUS SURGERY  2003  . LAPAROSCOPIC OVARIAN CYSTECTOMY  1983  . TONSILLECTOMY  1973  . TYMPANOPLASTY W/ MASTOIDECTOMY Left 2016 & 2017   Family History  Problem Relation Age of Onset  . Mental illness Mother        depression  . Suicidality Mother   . Alcohol abuse Father   . Kidney disease Father   . Mental illness Father        depression  . Suicidality Father   . Breast cancer Maternal Aunt        breast cancer  . Heart disease Maternal Grandmother   . Breast cancer Maternal Grandmother   . Suicidality Maternal Grandfather   . Stroke Paternal Grandmother   . Heart disease Paternal Grandmother   . Heart disease Paternal Grandfather    Social History   Tobacco Use   . Smoking status: Former Smoker    Packs/day: 0.50    Years: 30.00    Pack years: 15.00    Types: Cigarettes    Quit date: 08/24/2017    Years since quitting: 2.1  . Smokeless tobacco: Never Used  Substance Use Topics  . Alcohol use: No  . Drug use: No    ROS Constitutional: Denies fever, chills, weight loss/gain, headaches, insomnia,  night sweats, and change in appetite. Does c/o fatigue. Eyes: Denies redness, blurred vision, diplopia, discharge, itchy, watery eyes.  ENT: Denies discharge, congestion, post nasal drip, epistaxis, sore throat, earache, hearing loss, dental pain, Tinnitus, Vertigo, Sinus pain, snoring.  Cardio: Denies chest pain, palpitations, irregular heartbeat, syncope, dyspnea, diaphoresis, orthopnea, PND, claudication, edema Respiratory: denies cough, dyspnea, DOE, pleurisy, hoarseness, laryngitis, wheezing.  Gastrointestinal: Denies dysphagia, heartburn, reflux, water brash, pain, cramps, nausea, vomiting, bloating, diarrhea, constipation, hematemesis, melena, hematochezia, jaundice, hemorrhoids Genitourinary: Denies dysuria, frequency, urgency, nocturia, hesitancy, discharge, hematuria, flank pain Breast: Breast lumps, nipple discharge, bleeding.  Musculoskeletal: Denies arthralgia, myalgia, stiffness, Jt. Swelling, pain, limp, and strain/sprain. Denies falls. Skin: Denies puritis, rash, hives, warts, acne, eczema, changing in skin lesion Neuro: No weakness, tremor, incoordination, spasms, paresthesia, pain Psychiatric: Denies confusion, memory loss, sensory loss. Denies Depression. Endocrine: Denies change in weight, skin, hair change, nocturia, and paresthesia, diabetic polys, visual blurring, hyper / hypo glycemic episodes.  Heme/Lymph: No excessive bleeding, bruising, enlarged lymph nodes.  Physical Exam  BP 116/64   Pulse 88   Temp (!) 97.2 F (36.2 C)   Resp 18   Ht '5\' 2"'$  (1.575 m)   Wt 174 lb 9.6 oz (79.2 kg)   BMI 31.93 kg/m   General  Appearance: Overnourished, well groomed and in no apparent distress.  Eyes: PERRLA, EOMs, conjunctiva no swelling or erythema, normal fundi and vessels. Sinuses: No frontal/maxillary tenderness ENT/Mouth: EACs patent / TMs  nl. Nares clear without erythema, swelling, mucoid exudates. Oral hygiene is good. No erythema, swelling, or exudate. Tongue normal, non-obstructing. Tonsils not swollen or erythematous. Hearing normal.  Neck: Supple, thyroid not palpable. No bruits, nodes or JVD. Respiratory: Respiratory effort normal.  BS equal and clear bilateral without rales, rhonci, wheezing or stridor. Cardio: Heart sounds are normal with regular rate and rhythm and no murmurs,  rubs or gallops. Peripheral pulses are normal and equal bilaterally without edema. No aortic or femoral bruits. Chest: symmetric with normal excursions and percussion. Breasts: Symmetric, without lumps, nipple discharge, retractions, or fibrocystic changes.  Abdomen: Flat, soft with bowel sounds active. Nontender, no guarding, rebound, hernias, masses, or organomegaly.  Lymphatics: Non tender without lymphadenopathy.  Genitourinary:  Musculoskeletal: Full ROM all peripheral extremities, joint stability, 5/5 strength, and normal gait. Skin: Warm and dry without rashes, lesions, cyanosis, clubbing or  ecchymosis.  Neuro: Cranial nerves intact, reflexes equal bilaterally. Normal muscle tone, no cerebellar symptoms. Sensation intact to touch, vibratory and Monofilament to the toes bilaterally. Pysch: Alert and oriented X 3, normal affect, Insight and Judgment appropriate.   Assessment and Plan  1. Annual Preventative Screening Examination  2. Labile hypertension  - EKG 12-Lead - [place], RETROPERITNL ABD,  LTD - Urinalysis, Routine w reflex microscopic - Microalbumin / creatinine urine ratio - CBC with Differential/Platelet - COMPLETE METABOLIC PANEL WITH GFR - Magnesium - TSH  3. Hyperlipidemia associated with type 2  diabetes mellitus ([place])  - EKG 12-Lead - [place], RETROPERITNL ABD,  LTD - Lipid panel - TSH  4. Type 2 diabetes mellitus with diabetic neuropathy,  without long-term current use of insulin ([facility])  - EKG 12-Lead - [place], RETROPERITNL ABD,  LTD - Urinalysis, Routine w reflex microscopic - HM DIABETES FOOT EXAM - LOW EXTREMITY NEUR EXAM DOCUM - Insulin, random  5. Vitamin D deficiency  - VITAMIN D 25 Hydroxy  6. Hypothyroidism, unspecified type  - TSH  7. Screening for colorectal cancer  - POC Hemoccult Bld/Stl  8. Screening for ischemic heart disease  - EKG 12-Lead  9. FHx: heart disease  - EKG 12-Lead - [place], RETROPERITNL ABD,  LTD  10. Former smoker  - EKG 12-Lead - [place], RETROPERITNL ABD,  LTD  11. Screening for AAA (aortic abdominal aneurysm)  - [place], RETROPERITNL ABD,  LTD  12. Fatigue, unspecified type  - Iron,Total/Total Iron Binding Cap - Vitamin B12 - CBC with Differential/Platelet - TSH  13. Medication management  - Urinalysis, Routine w reflex microscopic - Microalbumin / creatinine urine ratio - CBC with Differential/Platelet - COMPLETE METABOLIC PANEL WITH GFR - Magnesium - Lipid panel - TSH - Hemoglobin A1c - Insulin, random - VITAMIN D 25 Hydroxy        Patient was counseled in prudent diet to achieve/maintain BMI less than 25 for weight control, BP monitoring, regular exercise and medications. Discussed med's effects and SE's. Screening labs and tests as requested with regular follow-up as recommended. Over 40 minutes of exam, counseling, chart review and high complex critical decision making was performed.   Kirtland Bouchard, MD

## 2019-10-06 ENCOUNTER — Ambulatory Visit: Payer: Managed Care, Other (non HMO) | Admitting: Internal Medicine

## 2019-10-06 ENCOUNTER — Other Ambulatory Visit: Payer: Self-pay

## 2019-10-06 VITALS — BP 116/64 | HR 88 | Temp 97.2°F | Resp 18 | Ht 62.0 in | Wt 174.6 lb

## 2019-10-06 DIAGNOSIS — Z0000 Encounter for general adult medical examination without abnormal findings: Secondary | ICD-10-CM

## 2019-10-06 DIAGNOSIS — Z1389 Encounter for screening for other disorder: Secondary | ICD-10-CM | POA: Diagnosis not present

## 2019-10-06 DIAGNOSIS — Z8249 Family history of ischemic heart disease and other diseases of the circulatory system: Secondary | ICD-10-CM | POA: Diagnosis not present

## 2019-10-06 DIAGNOSIS — Z87891 Personal history of nicotine dependence: Secondary | ICD-10-CM

## 2019-10-06 DIAGNOSIS — Z136 Encounter for screening for cardiovascular disorders: Secondary | ICD-10-CM | POA: Diagnosis not present

## 2019-10-06 DIAGNOSIS — Z130 Encounter for screening for diseases of the blood and blood-forming organs and certain disorders involving the immune mechanism: Secondary | ICD-10-CM

## 2019-10-06 DIAGNOSIS — Z1212 Encounter for screening for malignant neoplasm of rectum: Secondary | ICD-10-CM

## 2019-10-06 DIAGNOSIS — R5383 Other fatigue: Secondary | ICD-10-CM

## 2019-10-06 DIAGNOSIS — Z79899 Other long term (current) drug therapy: Secondary | ICD-10-CM | POA: Diagnosis not present

## 2019-10-06 DIAGNOSIS — E1169 Type 2 diabetes mellitus with other specified complication: Secondary | ICD-10-CM

## 2019-10-06 DIAGNOSIS — I10 Essential (primary) hypertension: Secondary | ICD-10-CM

## 2019-10-06 DIAGNOSIS — Z131 Encounter for screening for diabetes mellitus: Secondary | ICD-10-CM

## 2019-10-06 DIAGNOSIS — R0989 Other specified symptoms and signs involving the circulatory and respiratory systems: Secondary | ICD-10-CM

## 2019-10-06 DIAGNOSIS — Z0001 Encounter for general adult medical examination with abnormal findings: Secondary | ICD-10-CM

## 2019-10-06 DIAGNOSIS — Z13220 Encounter for screening for lipoid disorders: Secondary | ICD-10-CM

## 2019-10-06 DIAGNOSIS — E1140 Type 2 diabetes mellitus with diabetic neuropathy, unspecified: Secondary | ICD-10-CM

## 2019-10-06 DIAGNOSIS — Z1211 Encounter for screening for malignant neoplasm of colon: Secondary | ICD-10-CM

## 2019-10-06 DIAGNOSIS — E559 Vitamin D deficiency, unspecified: Secondary | ICD-10-CM

## 2019-10-06 DIAGNOSIS — E039 Hypothyroidism, unspecified: Secondary | ICD-10-CM

## 2019-10-06 MED ORDER — PREGABALIN 75 MG PO CAPS: ORAL_CAPSULE | ORAL | 1 refills | Status: DC

## 2019-10-07 ENCOUNTER — Encounter: Payer: Self-pay | Admitting: Physician Assistant

## 2019-10-07 LAB — HEMOGLOBIN A1C
Hgb A1c MFr Bld: 7.3 % of total Hgb — ABNORMAL HIGH (ref <5.7)
Mean Plasma Glucose: 163 (calc)
eAG (mmol/L): 9 (calc)

## 2019-10-07 LAB — COMPLETE METABOLIC PANEL WITH GFR
AG Ratio: 1.9 (calc) (ref 1.0–2.5)
ALT: 27 U/L (ref 6–29)
AST: 30 U/L (ref 10–35)
Albumin: 4.4 g/dL (ref 3.6–5.1)
Alkaline phosphatase (APISO): 84 U/L (ref 37–153)
BUN: 15 mg/dL (ref 7–25)
CO2: 24 mmol/L (ref 20–32)
Calcium: 10.4 mg/dL (ref 8.6–10.4)
Chloride: 104 mmol/L (ref 98–110)
Creat: 0.64 mg/dL (ref 0.50–1.05)
GFR, Est African American: 119 mL/min/{1.73_m2} (ref >=60)
GFR, Est Non African American: 103 mL/min/{1.73_m2} (ref >=60)
Globulin: 2.3 g/dL (calc) (ref 1.9–3.7)
Glucose, Bld: 184 mg/dL — ABNORMAL HIGH (ref 65–99)
Potassium: 4.1 mmol/L (ref 3.5–5.3)
Sodium: 138 mmol/L (ref 135–146)
Total Bilirubin: 0.4 mg/dL (ref 0.2–1.2)
Total Protein: 6.7 g/dL (ref 6.1–8.1)

## 2019-10-07 LAB — CBC WITH DIFFERENTIAL/PLATELET
Absolute Monocytes: 452 cells/uL (ref 200–950)
Basophils Absolute: 52 cells/uL (ref 0–200)
Basophils Relative: 1 %
Eosinophils Absolute: 151 cells/uL (ref 15–500)
Eosinophils Relative: 2.9 %
HCT: 39.3 % (ref 35.0–45.0)
Hemoglobin: 13.6 g/dL (ref 11.7–15.5)
Lymphs Abs: 2044 cells/uL (ref 850–3900)
MCH: 31.6 pg (ref 27.0–33.0)
MCHC: 34.6 g/dL (ref 32.0–36.0)
MCV: 91.2 fL (ref 80.0–100.0)
MPV: 10.8 fL (ref 7.5–12.5)
Monocytes Relative: 8.7 %
Neutro Abs: 2501 cells/uL (ref 1500–7800)
Neutrophils Relative %: 48.1 %
Platelets: 331 10*3/uL (ref 140–400)
RBC: 4.31 10*6/uL (ref 3.80–5.10)
RDW: 13.2 % (ref 11.0–15.0)
Total Lymphocyte: 39.3 %
WBC: 5.2 10*3/uL (ref 3.8–10.8)

## 2019-10-07 LAB — VITAMIN D 25 HYDROXY (VIT D DEFICIENCY, FRACTURES): Vit D, 25-Hydroxy: 57 ng/mL (ref 30–100)

## 2019-10-07 LAB — VITAMIN B12: Vitamin B-12: 234 pg/mL (ref 200–1100)

## 2019-10-07 LAB — URINALYSIS, ROUTINE W REFLEX MICROSCOPIC
Bilirubin Urine: NEGATIVE
Hgb urine dipstick: NEGATIVE
Ketones, ur: NEGATIVE
Leukocytes,Ua: NEGATIVE
Nitrite: NEGATIVE
Protein, ur: NEGATIVE
Specific Gravity, Urine: 1.039 — ABNORMAL HIGH (ref 1.001–1.03)
pH: <=5 (ref 5.0–8.0)

## 2019-10-07 LAB — LIPID PANEL
Cholesterol: 133 mg/dL (ref <200)
HDL: 31 mg/dL — ABNORMAL LOW (ref >=50)
Non-HDL Cholesterol (Calc): 102 mg/dL (calc) (ref <130)
Total CHOL/HDL Ratio: 4.3 (calc) (ref <5.0)
Triglycerides: 722 mg/dL — ABNORMAL HIGH (ref <150)

## 2019-10-07 LAB — MICROALBUMIN / CREATININE URINE RATIO
Creatinine, Urine: 62 mg/dL (ref 20–275)
Microalb Creat Ratio: 8 mcg/mg creat (ref <30)
Microalb, Ur: 0.5 mg/dL

## 2019-10-07 LAB — IRON, TOTAL/TOTAL IRON BINDING CAP
%SAT: 20 % (calc) (ref 16–45)
Iron: 74 ug/dL (ref 45–160)
TIBC: 374 mcg/dL (calc) (ref 250–450)

## 2019-10-07 LAB — MAGNESIUM: Magnesium: 1.8 mg/dL (ref 1.5–2.5)

## 2019-10-07 LAB — TSH: TSH: 0.56 mIU/L

## 2019-10-07 LAB — INSULIN, RANDOM: Insulin: 71.4 u[IU]/mL — ABNORMAL HIGH

## 2019-10-10 ENCOUNTER — Other Ambulatory Visit: Payer: Self-pay | Admitting: Adult Health

## 2019-10-10 DIAGNOSIS — G4700 Insomnia, unspecified: Secondary | ICD-10-CM

## 2019-10-11 ENCOUNTER — Other Ambulatory Visit: Payer: Self-pay | Admitting: Internal Medicine

## 2019-10-11 MED ORDER — NEOMYCIN-POLYMYXIN-HC 3.5-10000-1 OT SUSP: OTIC | 1 refills | Status: DC

## 2019-10-11 MED ORDER — DOXYCYCLINE HYCLATE 100 MG PO CAPS: ORAL_CAPSULE | ORAL | 0 refills | Status: DC

## 2019-10-11 NOTE — Progress Notes (Signed)
  Per patient     "I was just in last week and now my left ear, the one I always have trouble with, is hurting and I have the typical green purulent drainage that I am accustomed too when I have an ear infection. Would you possibly send in an antibiotic or will I need to come in? "   Rx's sent for Doxycycline & cortisporin Otic Suspension.

## 2019-10-17 ENCOUNTER — Other Ambulatory Visit: Payer: Self-pay | Admitting: Internal Medicine

## 2019-11-05 ENCOUNTER — Ambulatory Visit (INDEPENDENT_AMBULATORY_CARE_PROVIDER_SITE_OTHER): Payer: Managed Care, Other (non HMO) | Admitting: Physician Assistant

## 2019-11-05 ENCOUNTER — Encounter: Payer: Self-pay | Admitting: Physician Assistant

## 2019-11-05 VITALS — BP 90/50 | HR 112 | Temp 98.5°F | Ht 62.0 in | Wt 176.0 lb

## 2019-11-05 DIAGNOSIS — Z86010 Personal history of colonic polyps: Secondary | ICD-10-CM | POA: Diagnosis not present

## 2019-11-05 DIAGNOSIS — K589 Irritable bowel syndrome without diarrhea: Secondary | ICD-10-CM

## 2019-11-05 DIAGNOSIS — K219 Gastro-esophageal reflux disease without esophagitis: Secondary | ICD-10-CM | POA: Diagnosis not present

## 2019-11-05 DIAGNOSIS — R1319 Other dysphagia: Secondary | ICD-10-CM

## 2019-11-05 DIAGNOSIS — R110 Nausea: Secondary | ICD-10-CM

## 2019-11-05 DIAGNOSIS — Z01818 Encounter for other preprocedural examination: Secondary | ICD-10-CM

## 2019-11-05 MED ORDER — ONDANSETRON HCL 4 MG PO TABS: 4.0000 mg | ORAL_TABLET | Freq: Four times a day (QID) | ORAL | 1 refills | Status: DC | PRN

## 2019-11-05 MED ORDER — NA SULFATE-K SULFATE-MG SULF 17.5-3.13-1.6 GM/177ML PO SOLN: 1.0000 | Freq: Once | ORAL | 0 refills | Status: AC

## 2019-11-05 MED ORDER — OMEPRAZOLE 40 MG PO CPDR: 40.0000 mg | DELAYED_RELEASE_CAPSULE | Freq: Every day | ORAL | 2 refills | Status: DC

## 2019-11-05 NOTE — Patient Instructions (Signed)
You have been scheduled for an endoscopy and colonoscopy. Please follow the written instructions given to you at your visit today. Please pick up your prep supplies at the pharmacy within the next 1-3 days. If you use inhalers (even only as needed), please bring them with you on the day of your procedure. Your physician has requested that you go to [email or web address] and enter the access code given to you at your visit today. This web site gives a general overview about your procedure. However, you should still follow specific instructions given to you by our office regarding your preparation for the procedure.  We have sent the following medications to your pharmacy for you to pick up at your convenience: Omeprazole 40 mg daily zofran 4 mg every 6 hours as needed for nausea  We will request your records from [place], [place].  If you are age 69 or older, your body mass index should be between 23-30. Your Body mass index is 32.19 kg/m. If this is out of the aforementioned range listed, please consider follow up with your Primary Care Provider.  If you are age 15 or younger, your body mass index should be between 19-25. Your Body mass index is 32.19 kg/m. If this is out of the aformentioned range listed, please consider follow up with your Primary Care Provider.   Due to recent changes in healthcare laws, you may see the results of your imaging and laboratory studies on MyChart before your provider has had a chance to review them.  We understand that in some cases there may be results that are confusing or concerning to you. Not all laboratory results come back in the same time frame and the provider may be waiting for multiple results in order to interpret others.  Please give [place] 48 hours in order for your provider to thoroughly review all the results before contacting the office for clarification of your results.

## 2019-11-05 NOTE — Progress Notes (Addendum)
Subjective:    Patient ID: Caitlin Barnett, female    DOB: 1966-07-29, 54 y.o.   MRN: [id number]  HPI Caitlin Barnett is a pleasant 54 year old white female, new to GI today referred by Dr.Keown, to discuss colonoscopy and EGD. Patient has history of congestive heart failure, hypertension, GERD, adult onset diabetes mellitus, hypothyroidism and hyperlipidemia.  Echo in 2019 with EF of 60%. She unfortunately contracted COVID-19 in December 2751 which was complicated by pneumonia and respiratory failure requiring hospitalization.  She did not require intubation.  She has had a prolonged recovery.  She had significant weakness and fatigue, brain fog, joint pains and exertional dyspnea, all of which have gradually improved. She is still suffering from some memory issues and says she is still not thinking well.  After having COVID-19 she has been experiencing some intermittent dysphagia particularly to pills and occasionally solids or liquids.  This is not occurring on an every day basis.  She has remained on omeprazole 40 mg p.o. daily chronically.  She did have prior GI evaluation done in [place] about 5 years ago and thinks that she required an esophageal dilation at that time. She also had undergone colonoscopy at that time and she believes was told to have a 5-year follow-up. She is also complaining of having a "sour" stomach over the past month or so with intermittent burning.  She is also still having frequent nausea and an occasional episode of vomiting.  She had been using some NSAIDs a few months back but nothing recent.  She has had chronic IBS/constipation but says since being on Metformin that has helped with constipation.  She has not noticed any melena or hematochezia. She also described 2 episodes of what sounds like possible segmental ischemic colitis, she was hospitalized for 1 of these in 2015/16 in [place].  Review of Systems Pertinent positive and negative review of systems were noted in  the above HPI section.  All other review of systems was otherwise negative.  Outpatient Encounter Medications as of 11/05/2019  Medication Sig  . albuterol (PROVENTIL HFA;VENTOLIN HFA) 108 (90 Base) MCG/ACT inhaler 2 inhalations  15-20  minutes apart every 4 hours as needed to rescue Asthma (Patient taking differently: Inhale 2 puffs into the lungs every 4 (four) hours as needed (asthma). Inhale 15-20  minutes apart)  . albuterol (PROVENTIL) (2.5 MG/3ML) 0.083% nebulizer solution Take 3 mLs (2.5 mg total) by nebulization every 6 (six) hours as needed for wheezing or shortness of breath.  Marland Kitchen aspirin EC 81 MG tablet Take 81 mg by mouth daily.  . blood glucose meter kit and supplies KIT Dispense based on patient and insurance preference. Use up to four times daily as directed. (FOR ICD-9 250.00, 250.01).  . butalbital-acetaminophen-caffeine (FIORICET) 50-325-40 MG tablet Take 0.5 tablets by mouth every 4 (four) hours as needed for headache.  . cetirizine (ZYRTEC) 10 MG tablet Take 10 mg by mouth daily.  . Cholecalciferol 1000 units capsule Take 5,000 Units by mouth 2 (two) times daily.   . Cinnamon 500 MG TABS Take 2 capsules by mouth 2 (two) times daily.  . cyanocobalamin (,VITAMIN B-12,) 1000 MCG/ML injection INJECT 1 ML (1,000 MCG TOTAL) INTO THE MUSCLE EVERY 30 (THIRTY) DAYS.  Marland Kitchen doxycycline (VIBRAMYCIN) 100 MG capsule Take 1 capsule 2 x /day with food for infection  . empagliflozin (JARDIANCE) 25 MG TABS tablet Take 1 tablet Daily for Diabetes (Patient taking differently: Take 25 mg by mouth daily. )  . estrogen, conjugated,-medroxyprogesterone (PREMPRO) 0.45-1.5 MG  tablet TAKE 1 TABLET BY MOUTH EVERY DAY (Patient taking differently: Take 1 tablet by mouth daily. )  . fenofibrate (TRICOR) 145 MG tablet Take 1 tablet Daily for Triglycerides (Blood Fats) (Patient taking differently: Take 145 mg by mouth daily. )  . furosemide (LASIX) 40 MG tablet TAKE 40 MG (1 TAB) BY MOUTH DAILY. MAY TAKE 1/2 TAB ON  MOST DAYS AND 1 FULL TAB DAILY WHEN NEEDED.  Marland Kitchen glucose blood test strip Test blood sugar once daily  . ibuprofen (ADVIL,MOTRIN) 200 MG tablet Take 800 mg by mouth as needed for moderate pain.  Marland Kitchen levothyroxine (SYNTHROID) 125 MCG tablet Take 1 tablet daily except 1/2 tab on Sunday; take on an empty stomach with only water for 30 minutes & no Antacid meds, Calcium or Magnesium for 4 hours & avoid Biotin  . Magnesium 250 MG TABS Take 250 mg by mouth daily.  . metFORMIN (GLUCOPHAGE-XR) 500 MG 24 hr tablet TAKE 2 TABLETS BY MOUTH TWICE A DAY FOR DIABETES  . montelukast (SINGULAIR) 10 MG tablet TAKE 1 TABLET DAILY FOR ALLERGIES (Patient taking differently: Take 10 mg by mouth daily. )  . Na Sulfate-K Sulfate-Mg Sulf 17.5-3.13-1.6 GM/177ML SOLN Take 1 kit by mouth once for 1 dose.  . neomycin-polymyxin-hydrocortisone (CORTISPORIN) 3.5-10000-1 OTIC suspension Use 4 to 6 drops to the affected ear 3 to 4 x /day  . omeprazole (PRILOSEC) 40 MG capsule Take 1 capsule (40 mg total) by mouth daily.  . ondansetron (ZOFRAN) 4 MG tablet Take 1 tablet (4 mg total) by mouth every 6 (six) hours as needed for nausea.  . potassium chloride (K-DUR) 10 MEQ tablet Take 1 tablet Daily (Patient taking differently: Take 10 mEq by mouth daily. )  . pravastatin (PRAVACHOL) 20 MG tablet Take 1 tablet at Bedtime for Cholesterol (Patient taking differently: Take 20 mg by mouth at bedtime. )  . pregabalin (LYRICA) 75 MG capsule Take 1 capsule 3 x /day for Neuropathy Pains  . traZODone (DESYREL) 150 MG tablet Take 1/2 to 1 tablet 1 hour before Bedtime if needed for Sleep  . valACYclovir (VALTREX) 1000 MG tablet Take 1 tablet 3 x /day for Fever Blisters / Cold Sores  . [DISCONTINUED] omeprazole (PRILOSEC) 40 MG capsule TAKE 1 CAPSULE DAILY FOR ACID INDIGESTION (Patient taking differently: Take 40 mg by mouth daily. )   No facility-administered encounter medications on file as of 11/05/2019.   Allergies  Allergen Reactions  .  Tetanus Toxoids Other (See Comments)    Injection site abcess  . Sulfa Antibiotics Hives    itching   Patient Active Problem List   Diagnosis Date Noted  . Fatigue 08/21/2018  . Former smoker 08/21/2018  . FHx: heart disease 08/21/2018  . Class 1 obesity due to excess calories with serious comorbidity and body mass index (BMI) of 31.0 to 31.9 in adult 08/21/2018  . Hyperlipidemia, mixed 08/21/2018  . Labile hypertension 08/21/2018  . Chronic suppurative otitis media of left ear 08/12/2018  . Hepatic steatosis 12/09/2017  . Chronic diastolic (congestive) heart failure ([place]) 12/01/2017  . Bilateral lower extremity edema 11/07/2017  . Claudication in peripheral vascular disease ([place]) 11/07/2017  . Hyperlipidemia associated with type 2 diabetes mellitus ([place]) 10/20/2017  . GERD (gastroesophageal reflux disease) 10/20/2017  . Obesity (BMI 30.0-34.9) 10/20/2017  . Hypothyroidism 01/16/2017  . Type 2 diabetes mellitus ([place]) 01/16/2017  . Vitamin D deficiency 01/16/2017  . Postmenopausal 01/16/2017  . Hot flashes due to menopause 01/16/2017  . Vitamin B12 deficiency 01/16/2017  .  Peripheral neuropathy 01/16/2017   Social History   Socioeconomic History  . Marital status: Married    Spouse name: Not on file  . Number of children: Not on file  . Years of education: Not on file  . Highest education level: Not on file  Occupational History  . Not on file  Tobacco Use  . Smoking status: Former Smoker    Packs/day: 0.50    Years: 30.00    Pack years: 15.00    Types: Cigarettes    Quit date: 08/24/2017    Years since quitting: 2.2  . Smokeless tobacco: Never Used  Substance and Sexual Activity  . Alcohol use: No  . Drug use: No  . Sexual activity: Not on file  Other Topics Concern  . Not on file  Social History Narrative  . Not on file   Social Determinants of Health   Financial Resource Strain:   . Difficulty of Paying Living Expenses:   Food Insecurity:   . Worried  About Charity fundraiser in the Last Year:   . Arboriculturist in the Last Year:   Transportation Needs:   . Film/video editor (Medical):   Marland Kitchen Lack of Transportation (Non-Medical):   Physical Activity:   . Days of Exercise per Week:   . Minutes of Exercise per Session:   Stress:   . Feeling of Stress :   Social Connections:   . Frequency of Communication with Friends and Family:   . Frequency of Social Gatherings with Friends and Family:   . Attends Religious Services:   . Active Member of Clubs or Organizations:   . Attends Archivist Meetings:   Marland Kitchen Marital Status:   Intimate Partner Violence:   . Fear of Current or Ex-Partner:   . Emotionally Abused:   Marland Kitchen Physically Abused:   . Sexually Abused:     Ms. Bensman family history includes Alcohol abuse in her father; Breast cancer in her maternal aunt and maternal grandmother; Depression in her father and mother; Esophageal cancer in her maternal uncle; Heart disease in her maternal grandmother, paternal grandfather, and paternal grandmother; Kidney disease in her father; Stroke in her paternal grandmother; Suicidality in her father, maternal grandfather, and mother.      Objective:    Vitals:   11/05/19 1506  BP: (!) 90/50  Pulse: (!) 112  Temp: 98.5 F (36.9 C)    Physical Exam Well-developed well-nourished white female in no acute distress.  Height, Weight, 176 BMI 32.1  HEENT; nontraumatic normocephalic, EOMI, PER R LA, sclera anicteric. Oropharynx not examined today; Neck; supple, no JVD Cardiovascular; regular rate and rhythm with S1-S2, no murmur rub or gallop Pulmonary; Clear bilaterally Abdomen; soft, nontender, nondistended, no palpable mass or hepatosplenomegaly, bowel sounds are active Rectal; not done today Skin; benign exam, no jaundice rash or appreciable lesions Extremities; no clubbing cyanosis or edema skin warm and dry Neuro/Psych; alert and oriented x4, grossly nonfocal mood and affect  appropriate       Assessment & Plan:  #59 54 year old white female status post COVID-30 July 7866 complicated by pneumonia and respiratory failure for which she was hospitalized, with prolonged recovery. Patient has had several GI symptoms since COVID-19 including frequent nausea, occasional vomiting, dyspepsia. She is also been having intermittent dysphagia to pills, solids and liquids. She has history of chronic GERD and believes she did have prior EGD with dilation. Rule out mild neurogenic dysphagia post Covid was prolonged weakness, rule out  possible motility disorder or stricture.  #2 history of chronic constipation and IBS #3 patient reported history of colon polyps with indication for 5-year interval follow-up  #4 congestive heart failure-compensated last EF 60% #5 hypertension 6.  Adult onset diabetes mellitus 7.  Hyperlipidemia 8.  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 every morning Start Zofran 4 mg every 6 hours as needed for nausea Patient has signed a release and will try to obtain GI records from [facility]. Will schedule for colonoscopy and upper endoscopy with Dr. Loletha Carrow.  Both procedures were discussed in detail with the patient including indications risks and benefits and she is agreeable to proceed. She will be at the 90-day window post Covid so will require  testing prior to procedures.   Amy S Esterwood PA-C   Addendum; records received from previous gastroenterologist, [place].  Colonoscopy 11/21/2014 with finding of internal hemorrhoids, mild diverticulosis sigmoid colon and otherwise negative exam. EGD 11/21/2014, no esophagitis or Barrett's changes noted mild antral gastritis otherwise normal exam.  Duodenal biopsies negative and gastric antral biopsy negative and no evidence of H. pylori.      11/05/2019   Cc: Unk Pinto, MD

## 2019-11-07 ENCOUNTER — Other Ambulatory Visit: Payer: Self-pay | Admitting: Internal Medicine

## 2019-11-07 DIAGNOSIS — E538 Deficiency of other specified B group vitamins: Secondary | ICD-10-CM

## 2019-11-07 DIAGNOSIS — E039 Hypothyroidism, unspecified: Secondary | ICD-10-CM

## 2019-11-07 DIAGNOSIS — Z79899 Other long term (current) drug therapy: Secondary | ICD-10-CM

## 2019-11-07 DIAGNOSIS — L659 Nonscarring hair loss, unspecified: Secondary | ICD-10-CM

## 2019-11-15 ENCOUNTER — Other Ambulatory Visit: Payer: Self-pay

## 2019-11-15 ENCOUNTER — Other Ambulatory Visit: Payer: Managed Care, Other (non HMO)

## 2019-11-15 DIAGNOSIS — Z79899 Other long term (current) drug therapy: Secondary | ICD-10-CM

## 2019-11-15 DIAGNOSIS — E538 Deficiency of other specified B group vitamins: Secondary | ICD-10-CM

## 2019-11-15 DIAGNOSIS — L659 Nonscarring hair loss, unspecified: Secondary | ICD-10-CM

## 2019-11-15 DIAGNOSIS — E039 Hypothyroidism, unspecified: Secondary | ICD-10-CM | POA: Diagnosis not present

## 2019-11-16 NOTE — Progress Notes (Signed)
____________________________________________________________  Attending physician addendum:  Thank you for sending this case to me. I have reviewed the entire note, and the outlined plan seems appropriate.  Does seem like post-COVID GI symptoms.    Amada Jupiter, MD  ____________________________________________________________

## 2019-11-18 LAB — ZINC: Zinc: 83 ug/dL (ref 60–130)

## 2019-11-18 LAB — TSH: TSH: 0.56 mIU/L

## 2019-11-18 LAB — METHYLMALONIC ACID, SERUM: Methylmalonic Acid, Quant: 111 nmol/L (ref 87–318)

## 2019-11-18 LAB — VITAMIN B12: Vitamin B-12: 311 pg/mL (ref 200–1100)

## 2019-12-07 ENCOUNTER — Other Ambulatory Visit: Payer: Self-pay | Admitting: Gastroenterology

## 2019-12-07 ENCOUNTER — Ambulatory Visit (INDEPENDENT_AMBULATORY_CARE_PROVIDER_SITE_OTHER): Payer: Managed Care, Other (non HMO)

## 2019-12-07 DIAGNOSIS — Z1159 Encounter for screening for other viral diseases: Secondary | ICD-10-CM

## 2019-12-08 LAB — SARS CORONAVIRUS 2 (TAT 6-24 HRS): SARS Coronavirus 2: NEGATIVE

## 2019-12-09 ENCOUNTER — Other Ambulatory Visit: Payer: Self-pay

## 2019-12-09 ENCOUNTER — Encounter: Payer: Self-pay | Admitting: Gastroenterology

## 2019-12-09 ENCOUNTER — Ambulatory Visit: Payer: Managed Care, Other (non HMO) | Admitting: Gastroenterology

## 2019-12-09 VITALS — BP 109/68 | HR 79 | Temp 97.7°F | Resp 15 | Ht 62.0 in | Wt 176.0 lb

## 2019-12-09 DIAGNOSIS — K529 Noninfective gastroenteritis and colitis, unspecified: Secondary | ICD-10-CM

## 2019-12-09 DIAGNOSIS — Z86010 Personal history of colonic polyps: Secondary | ICD-10-CM

## 2019-12-09 DIAGNOSIS — R1319 Other dysphagia: Secondary | ICD-10-CM

## 2019-12-09 MED ORDER — SODIUM CHLORIDE 0.9 % IV SOLN: 500.0000 mL | Freq: Once | INTRAVENOUS | Status: DC

## 2019-12-09 NOTE — Progress Notes (Signed)
Report to PACU, RN, vss, BBS= Clear.  

## 2019-12-09 NOTE — Op Note (Signed)
[facility] Patient Name: Caitlin Barnett Procedure Date: 12/09/2019 8:03 AM MRN: [id number] Endoscopist: [place]. Loletha Carrow , MD Age: 54 Referring MD:  Date of Birth: 1966/02/23 Gender: Female Account #: [id number] Procedure:                Upper GI endoscopy Indications:              Indigestion, Esophageal dysphagia Medicines:                Monitored Anesthesia Care Procedure:                Pre-Anesthesia Assessment:                           - Prior to the procedure, a History and Physical                            was performed, and patient medications and                            allergies were reviewed. The patient's tolerance of                            previous anesthesia was also reviewed. The risks                            and benefits of the procedure and the sedation                            options and risks were discussed with the patient.                            All questions were answered, and informed consent                            was obtained. Prior Anticoagulants: The patient has                            taken no previous anticoagulant or antiplatelet                            agents except for aspirin. ASA Grade Assessment:                            III - A patient with severe systemic disease. After                            reviewing the risks and benefits, the patient was                            deemed in satisfactory condition to undergo the                            procedure.  After obtaining informed consent, the endoscope was                            passed under direct vision. Throughout the                            procedure, the patient's blood pressure, pulse, and                            oxygen saturations were monitored continuously. The                            Endoscope was introduced through the mouth, and                            advanced to the second part of duodenum. The upper                             GI endoscopy was accomplished without difficulty.                            The patient tolerated the procedure well. Scope In: Scope Out: Findings:                 The esophagus was normal.                           The stomach was normal.                           The cardia and gastric fundus were normal on                            retroflexion.                           The examined duodenum was normal. Complications:            No immediate complications. Estimated Blood Loss:     Estimated blood loss: none. Impression:               - Normal esophagus.                           - Normal stomach.                           - Normal examined duodenum.                           - No specimens collected. Recommendation:           - Patient has a contact number available for                            emergencies. The signs and symptoms of potential  delayed complications were discussed with the                            patient. Return to normal activities tomorrow.                            Written discharge instructions were provided to the                            patient.                           - Resume previous diet.                           - Continue present medications.                           - See the other procedure note for documentation of                            additional recommendations. [name] L. Myrtie Neither, MD 12/09/2019 8:50:55 AM This report has been signed electronically.

## 2019-12-09 NOTE — Progress Notes (Signed)
Patient moaning and complaining of pain in abdomen. Dr. Myrtie Neither into evaluate patient.

## 2019-12-09 NOTE — Op Note (Signed)
[facility] Patient Name: Caitlin Barnett Procedure Date: 12/09/2019 8:03 AM MRN: [id number] Endoscopist: Caitlin Barnett L. Myrtie Neither , MD Age: 54 Referring MD:  Date of Birth: 1966-03-02 Gender: Female Account #: [id number] Procedure:                Colonoscopy Indications:              Abdominal pain in the left lower quadrant, Change                            in bowel habits (intermittent diarrhea. all since                            COVID Dec 2020. Hx IBS) Last colonoscopy in [facility] 2016 Medicines:                Monitored Anesthesia Care Procedure:                Pre-Anesthesia Assessment:                           - Prior to the procedure, a History and Physical                            was performed, and patient medications and                            allergies were reviewed. The patient's tolerance of                            previous anesthesia was also reviewed. The risks                            and benefits of the procedure and the sedation                            options and risks were discussed with the patient.                            All questions were answered, and informed consent                            was obtained. Prior Anticoagulants: The patient has                            taken no previous anticoagulant or antiplatelet                            agents except for aspirin. ASA Grade Assessment:                            III - A patient with severe systemic disease. After                            reviewing the risks and benefits, the patient was  deemed in satisfactory condition to undergo the                            procedure.                           After obtaining informed consent, the colonoscope                            was passed under direct vision. Throughout the                            procedure, the patient's blood pressure, pulse, and                            oxygen saturations were monitored  continuously. The                            Colonoscope was introduced through the anus and                            advanced to the the cecum, identified by                            appendiceal orifice and ileocecal valve. The                            colonoscopy was somewhat difficult due to multiple                            diverticula in the colon, a redundant colon and a                            tortuous colon. Successful completion of the                            procedure was aided by changing the patient to a                            semi-prone and semi-supine position and using water                            inflation and manual pressure. The patient                            tolerated the procedure well. The quality of the                            bowel preparation was excellent. The ileocecal                            valve, appendiceal orifice, and rectum were  photographed. Scope In: 8:13:02 AM Scope Out: 8:33:48 AM Scope Withdrawal Time: 0 hours 10 minutes 29 seconds  Total Procedure Duration: 0 hours 20 minutes 46 seconds  Findings:                 The perianal and digital rectal examinations were                            normal.                           Multiple diverticula were found in the sigmoid                            colon.                           Normal mucosa was found in the entire colon.                            Biopsies for histology were taken with a cold                            forceps from the right colon and left colon for                            evaluation of microscopic colitis.                           The exam was otherwise without abnormality on                            direct and retroflexion views. Complications:            No immediate complications. Estimated Blood Loss:     Estimated blood loss was minimal. Impression:               - Diverticulosis in the sigmoid colon.                            - Normal mucosa in the entire examined colon.                            Biopsied.                           - The examination was otherwise normal on direct                            and retroflexion views. Recommendation:           - Patient has a contact number available for                            emergencies. The signs and symptoms of potential                            delayed complications were  discussed with the                            patient. Return to normal activities tomorrow.                            Written discharge instructions were provided to the                            patient.                           - Resume previous diet.                           - Continue present medications.                           - Await pathology results.                           - Repeat colonoscopy in 10 years for screening                            purposes.                           - See the other procedure note for documentation of                            additional recommendations.                           - Consider trial of IB-gard [name] L. Loletha Carrow, MD 12/09/2019 8:48:19 AM This report has been signed electronically.

## 2019-12-09 NOTE — Progress Notes (Addendum)
Fentanyl given by Sharia Reeve Monday CRNA at 905 am per Dr. Myrtie Neither order.

## 2019-12-09 NOTE — Progress Notes (Signed)
Called to room to assist during endoscopic procedure.  Patient ID and intended procedure confirmed with present staff. Received instructions for my participation in the procedure from the performing physician.  

## 2019-12-09 NOTE — Patient Instructions (Signed)
Handout on diverticulosis given.  ° ° °YOU HAD AN ENDOSCOPIC PROCEDURE TODAY AT THE [facility]:   Refer to the procedure report that was given to you for any specific questions about what was found during the examination.  If the procedure report does not answer your questions, please call your gastroenterologist to clarify.  If you requested that your care partner not be given the details of your procedure findings, then the procedure report has been included in a sealed envelope for you to review at your convenience later. ° °YOU SHOULD EXPECT: Some feelings of bloating in the abdomen. Passage of more gas than usual.  Walking can help get rid of the air that was put into your GI tract during the procedure and reduce the bloating. If you had a lower endoscopy (such as a colonoscopy or flexible sigmoidoscopy) you may notice spotting of blood in your stool or on the toilet paper. If you underwent a bowel prep for your procedure, you may not have a normal bowel movement for a few days. ° °Please Note:  You might notice some irritation and congestion in your nose or some drainage.  This is from the oxygen used during your procedure.  There is no need for concern and it should clear up in a day or so. ° °SYMPTOMS TO REPORT IMMEDIATELY: ° °Following lower endoscopy (colonoscopy or flexible sigmoidoscopy): ° Excessive amounts of blood in the stool ° Significant tenderness or worsening of abdominal pains ° Swelling of the abdomen that is new, acute ° Fever of 100°F or higher ° °Following upper endoscopy (EGD) ° Vomiting of blood or coffee ground material ° New chest pain or pain under the shoulder blades ° Painful or persistently difficult swallowing ° New shortness of breath ° Fever of 100°F or higher ° Black, tarry-looking stools ° °For urgent or emergent issues, a gastroenterologist can be reached at any hour by calling [phone]. °Do not use MyChart messaging for urgent concerns.  ° ° °DIET:  We do  recommend a small meal at first, but then you may proceed to your regular diet.  Drink plenty of fluids but you should avoid alcoholic beverages for 24 hours. ° °ACTIVITY:  You should plan to take it easy for the rest of today and you should NOT DRIVE or use heavy machinery until tomorrow (because of the sedation medicines used during the test).   ° °FOLLOW UP: °Our staff will call the number listed on your records 48-72 hours following your procedure to check on you and address any questions or concerns that you may have regarding the information given to you following your procedure. If we do not reach you, we will leave a message.  We will attempt to reach you two times.  During this call, we will ask if you have developed any symptoms of COVID 19. If you develop any symptoms (ie: fever, flu-like symptoms, shortness of breath, cough etc.) before then, please call [phone].  If you test positive for Covid 19 in the 2 weeks post procedure, please call and report this information to us.   ° °If any biopsies were taken you will be contacted by phone or by letter within the next 1-3 weeks.  Please call us at [phone] if you have not heard about the biopsies in 3 weeks.  ° ° °SIGNATURES/CONFIDENTIALITY: °You and/or your care partner have signed paperwork which will be entered into your electronic medical record.  These signatures attest to the fact that   that the information above on your After Visit Summary has been reviewed and is understood.  Full responsibility of the confidentiality of this discharge information lies with you and/or your care-partner.  °

## 2019-12-09 NOTE — Progress Notes (Signed)
Temp by JB Vitals by KA  Pt's states no medical or surgical changes since previsit or office visit.  

## 2019-12-10 ENCOUNTER — Other Ambulatory Visit: Payer: Self-pay | Admitting: Internal Medicine

## 2019-12-10 ENCOUNTER — Encounter: Payer: Self-pay | Admitting: Internal Medicine

## 2019-12-10 DIAGNOSIS — G4700 Insomnia, unspecified: Secondary | ICD-10-CM

## 2019-12-10 HISTORY — DX: Insomnia, unspecified: G47.00

## 2019-12-10 MED ORDER — TRAZODONE HCL 150 MG PO TABS: ORAL_TABLET | ORAL | 3 refills | Status: DC

## 2019-12-13 ENCOUNTER — Encounter: Payer: Self-pay | Admitting: Gastroenterology

## 2019-12-13 ENCOUNTER — Telehealth: Payer: Self-pay

## 2019-12-13 NOTE — Telephone Encounter (Signed)
  Follow up Call-  Call back number 12/09/2019  Post procedure Call Back phone  # [phone]  Permission to leave phone message Yes  Some recent data might be hidden     Patient questions:  Do you have a fever, pain , or abdominal swelling? No. Pain Score  0 *  Have you tolerated food without any problems? Yes.    Have you been able to return to your normal activities? Yes.    Do you have any questions about your discharge instructions: Diet   No. Medications  No. Follow up visit  No.  Do you have questions or concerns about your Care? No.  Actions: * If pain score is 4 or above: No action needed, pain <4.  1. Have you developed a fever since your procedure? no  2.   Have you had an respiratory symptoms (SOB or cough) since your procedure? no  3.   Have you tested positive for COVID 19 since your procedure no  4.   Have you had any family members/close contacts diagnosed with the COVID 19 since your procedure?  no   If yes to any of these questions please route to Laverna Peace, RN and Charlett Lango, RN

## 2019-12-23 ENCOUNTER — Other Ambulatory Visit: Payer: Self-pay | Admitting: Internal Medicine

## 2019-12-25 ENCOUNTER — Other Ambulatory Visit: Payer: Self-pay | Admitting: Internal Medicine

## 2019-12-25 DIAGNOSIS — Z780 Asymptomatic menopausal state: Secondary | ICD-10-CM

## 2019-12-25 DIAGNOSIS — N951 Menopausal and female climacteric states: Secondary | ICD-10-CM

## 2019-12-28 ENCOUNTER — Other Ambulatory Visit: Payer: Self-pay | Admitting: Adult Health

## 2019-12-30 ENCOUNTER — Ambulatory Visit: Payer: Managed Care, Other (non HMO) | Attending: Internal Medicine

## 2019-12-30 DIAGNOSIS — Z23 Encounter for immunization: Secondary | ICD-10-CM

## 2019-12-30 NOTE — Progress Notes (Signed)
   Covid-19 Vaccination Clinic  Name:  Caitlin Barnett    MRN: [id number] DOB: [birth year]  12/30/2019  Caitlin Barnett was observed post Covid-19 immunization for 15 minutes without incident. She was provided with Vaccine Information Sheet and instruction to access the V-Safe system.   Caitlin Barnett was instructed to call 911 with any severe reactions post vaccine: Marland Kitchen Difficulty breathing  . Swelling of face and throat  . A fast heartbeat  . A bad rash all over body  . Dizziness and weakness   Immunizations Administered    Name Date Dose VIS Date Route   Pfizer COVID-19 Vaccine 12/30/2019  3:56 PM 0.3 mL 10/06/2018 Intramuscular   Manufacturer: [employer], [employer]   Lot: [id number]   NDC: [address]-0

## 2020-01-05 NOTE — Progress Notes (Deleted)
FOLLOW UP  Assessment and Plan:   diastolic CHF Appears euvolemic; follows with cardiology  Encouraged daily monitoring of the patient's weight, call office if 3 lb weight loss or gain in a day.  Encouraged regular exercise. If any increasing shortness of breath, swelling, or chest pressure go to ER immediately.  decrease your fluid intake to less than 2 L daily please remember to always increase your potassium intake with any increase of your fluid pill.   Hypertension At goal; currently on lasix 20 mg daily for edema; discussed may need to decrease with initiation of jardiance Monitor blood pressure at home; patient to call if consistently greater than 130/80 Continue DASH diet.   Reminder to go to the ER if any CP, SOB, nausea, dizziness, severe HA, changes vision/speech, left arm numbness and tingling and jaw pain.  Cholesterol Currently above goal; on fenofibrate, *** pravastatin 20 mg daily, advised she start on omega 3/fish oil supplement for trigs Continue low cholesterol diet and exercise.  Check lipid panel.   Diabetes with other circulatory complications Continue medication: metformin, starting jardiance today - samples given, start 10 mg daily then increase to 25 mg daily; discussed risks/benefits, information given on AVS - call with any questions/concerns/potential SE Continue diet and exercise.  Perform daily foot/skin check, notify office of any concerning changes.  Check A1C  Obesity with co morbidities *** Long discussion about weight loss, diet, and exercise Recommended diet heavy in fruits and veggies and low in animal meats, cheeses, and dairy products, appropriate calorie intake Discussed ideal weight for height and initial weight goal (<155lb) She has taken phentermine with significant benefit previously and strongly requesting to restart this- disucssed cardiac risks and would like to proceed cautiously. Will start the patient on phentermine- 1/2 tab daily to  begin with. Hand out given and AE's discussed, will do close follow up.  Will follow up in 3 months   Hypothyroidism continue medications the same pending lab results reminded to take on an empty stomach 30-82mns before food.  check TSH level  Vitamin D Def Near goal at last visit; she has not changed her dose continue supplementation to maintain goal of 60-100 DeferVit D level  Continue diet and meds as discussed. Further disposition pending results of labs. Discussed med's effects and SE's.   Over 30 minutes of exam, counseling, chart review, and critical decision making was performed.   Future Appointments  Date Time Provider DLarimore 01/06/2020  3:30 PM CLiane Comber NP [facility] None  01/27/2020  4:15 PM CKanoradoPEC-PEC [facility]  04/11/2020  4:00 PM MUnk Pinto MD [facility] None  10/31/2020  3:00 PM MUnk Pinto MD [facility] None    ----------------------------------------------------------------------------------------------------------------------  HPI 54y.o. female  presents for 3 month follow up on hypertension, cholesterol, diabetes, hypothyroid, obesity and vitamin D deficiency.   She is a former smoker; She was abdmitted  in 11/2017 with LLL pneumonia; 2D ECHO showed normal LV function with grade 1 diastolic dysfunction. Improved with IV diuretics and remains on oral lasix 20 mg with improved breathing reported by patient.  Patient was hospitalized Dec 13- Dec 20 with Covid Pneumonia and Acute respiratory failure, slow extended recovery treated with Remdesivir & Decadron, was ultimately able to wean off of O2 by mid Jan 2021. ***  Claudication in PVD ? ***  *** review diagnoses  BMI is There is no height or weight on file to calculate BMI., she has been working on diet and exercise,  working on cutting carbs, slowly starting to increase exercise, trying to walk daily.  Wt Readings from Last 3 Encounters:  12/09/19 176 lb (79.8  kg)  11/05/19 176 lb (79.8 kg)  10/06/19 174 lb 9.6 oz (79.2 kg)   Her blood pressure has been controlled at home, today their BP is    She does workout. She denies chest pain, shortness of breath, dizziness.   She is on cholesterol medication Pravastatin *** and Fenofibrate *** and denies myalgias. Her cholesterol is not at goal. The cholesterol last visit was:   Lab Results  Component Value Date   CHOL 133 10/06/2019   HDL 31 (L) 10/06/2019   [place]  10/06/2019     Comment:     . LDL cholesterol not calculated. Triglyceride levels greater than 400 mg/dL invalidate calculated LDL results. . Reference range: <100 . Desirable range <100 mg/dL for primary prevention;   <70 mg/dL for patients with CHD or diabetic patients  with > or = 2 CHD risk factors. Marland Kitchen LDL-C is now calculated using the Martin-Hopkins  calculation, which is a validated novel method providing  better accuracy than the Friedewald equation in the  estimation of LDL-C.  [name] et al. Annamaria Helling. 2707;867(54): 2061-2068  (http://education.[facility].com/faq/FAQ164)    TRIG 722 (H) 10/06/2019   CHOLHDL 4.3 10/06/2019    She has been working on diet and exercise for T2 diabetes treated by metformin 1000 mg BID, jardiance, and denies hyperglycemia, hypoglycemia , increased appetite, nausea, paresthesia of the feet, polydipsia, polyuria and visual disturbances. She does check fasting glucose, ranges 100-150. Last A1C in the office was:  Lab Results  Component Value Date   HGBA1C 7.3 (H) 10/06/2019    Last GFR:  Lab Results  Component Value Date   GFRNONAA 103 10/06/2019   She is on thyroid medication. Her medication was not changed last visit.   Lab Results  Component Value Date   TSH 0.56 11/15/2019   Patient is on Vitamin D supplement.   Lab Results  Component Value Date   VD25OH 57 10/06/2019        Current Medications:  Current Outpatient Medications on File Prior to Visit  Medication Sig  .  albuterol (PROVENTIL HFA;VENTOLIN HFA) 108 (90 Base) MCG/ACT inhaler 2 inhalations  15-20  minutes apart every 4 hours as needed to rescue Asthma (Patient taking differently: Inhale 2 puffs into the lungs every 4 (four) hours as needed (asthma). Inhale 15-20  minutes apart)  . albuterol (PROVENTIL) (2.5 MG/3ML) 0.083% nebulizer solution Take 3 mLs (2.5 mg total) by nebulization every 6 (six) hours as needed for wheezing or shortness of breath. (Patient not taking: Reported on 12/09/2019)  . aspirin EC 81 MG tablet Take 81 mg by mouth daily.  . blood glucose meter kit and supplies KIT Dispense based on patient and insurance preference. Use up to four times daily as directed. (FOR ICD-9 250.00, 250.01).  . butalbital-acetaminophen-caffeine (FIORICET) 50-325-40 MG tablet Take 0.5 tablets by mouth every 4 (four) hours as needed for headache.  . cetirizine (ZYRTEC) 10 MG tablet Take 10 mg by mouth daily.  . Cholecalciferol 1000 units capsule Take 5,000 Units by mouth 2 (two) times daily.   . Cinnamon 500 MG TABS Take 2 capsules by mouth 2 (two) times daily.  . cyanocobalamin (,VITAMIN B-12,) 1000 MCG/ML injection INJECT 1 ML (1,000 MCG TOTAL) INTO THE MUSCLE EVERY 30 (THIRTY) DAYS.  Marland Kitchen estrogen, conjugated,-medroxyprogesterone (PREMPRO) 0.45-1.5 MG tablet TAKE 1 TABLET BY MOUTH EVERY  DAY  . fenofibrate (TRICOR) 145 MG tablet Take 1 tablet Daily for Triglycerides (Blood Fats) (Patient taking differently: Take 145 mg by mouth daily. )  . furosemide (LASIX) 40 MG tablet TAKE 40 MG (1 TAB) BY MOUTH DAILY. MAY TAKE 1/2 TAB ON MOST DAYS AND 1 FULL TAB DAILY WHEN NEEDED.  Marland Kitchen glucose blood test strip Test blood sugar once daily  . ibuprofen (ADVIL,MOTRIN) 200 MG tablet Take 800 mg by mouth as needed for moderate pain.  Marland Kitchen JARDIANCE 25 MG TABS tablet TAKE 1 TABLET BY MOUTH DAILY FOR DIABETES  . levothyroxine (SYNTHROID) 125 MCG tablet Take 1 tablet daily except 1/2 tab on Sunday; take on an empty stomach with only  water for 30 minutes & no Antacid meds, Calcium or Magnesium for 4 hours & avoid Biotin  . Magnesium 250 MG TABS Take 250 mg by mouth daily.  . metFORMIN (GLUCOPHAGE-XR) 500 MG 24 hr tablet TAKE 2 TABLETS BY MOUTH TWICE A DAY FOR DIABETES  . montelukast (SINGULAIR) 10 MG tablet TAKE 1 TABLET DAILY FOR ALLERGIES (Patient taking differently: Take 10 mg by mouth daily. )  . neomycin-polymyxin-hydrocortisone (CORTISPORIN) 3.5-10000-1 OTIC suspension Use 4 to 6 drops to the affected ear 3 to 4 x /day (Patient not taking: Reported on 12/09/2019)  . omeprazole (PRILOSEC) 40 MG capsule Take 1 capsule (40 mg total) by mouth daily.  . ondansetron (ZOFRAN) 4 MG tablet Take 1 tablet (4 mg total) by mouth every 6 (six) hours as needed for nausea.  . potassium chloride (KLOR-CON) 10 MEQ tablet TAKE 1 TABLET BY MOUTH EVERY DAY  . pravastatin (PRAVACHOL) 20 MG tablet Take 1 tablet at Bedtime for Cholesterol (Patient taking differently: Take 20 mg by mouth at bedtime. )  . pregabalin (LYRICA) 75 MG capsule Take 1 capsule 3 x /day for Neuropathy Pains  . traZODone (DESYREL) 150 MG tablet Take 1/2 to 1 tablet 1 hour before Bedtime if needed for Sleep (G47.00)  . valACYclovir (VALTREX) 1000 MG tablet Take 1 tablet 3 x /day for Fever Blisters / Cold Sores   No current facility-administered medications on file prior to visit.     Allergies:  Allergies  Allergen Reactions  . Tetanus Toxoids Other (See Comments)    Injection site abcess  . Sulfa Antibiotics Hives    itching     Medical History:  Past Medical History:  Diagnosis Date  . COVID-19   . Diabetes mellitus without complication ([place])   . Diverticulitis   . GERD (gastroesophageal reflux disease)   . Hyperlipidemia   . IBS (irritable bowel syndrome)   . Insomnia 12/10/2019  . Migraine   . Pneumonia 11/2017  . Thyroid disease   . UTI (urinary tract infection)    Family history- Reviewed and unchanged Social history- Reviewed and  unchanged   Review of Systems:  Review of Systems  Constitutional: Negative for malaise/fatigue and weight loss.  HENT: Negative for hearing loss and tinnitus.   Eyes: Negative for blurred vision and double vision.  Respiratory: Negative for cough, shortness of breath and wheezing.   Cardiovascular: Negative for chest pain, palpitations, orthopnea, claudication and leg swelling.  Gastrointestinal: Negative for abdominal pain, blood in stool, constipation, diarrhea, heartburn, melena, nausea and vomiting.  Genitourinary: Negative.   Musculoskeletal: Negative for joint pain and myalgias.  Skin: Negative for rash.  Neurological: Negative for dizziness, tingling, sensory change, weakness and headaches.  Endo/Heme/Allergies: Negative for polydipsia.  Psychiatric/Behavioral: Negative.   All other systems reviewed and are  negative.     Physical Exam: There were no vitals taken for this visit. Wt Readings from Last 3 Encounters:  12/09/19 176 lb (79.8 kg)  11/05/19 176 lb (79.8 kg)  10/06/19 174 lb 9.6 oz (79.2 kg)   General Appearance: Well nourished, in no apparent distress. Eyes: PERRLA, EOMs, conjunctiva no swelling or erythema Sinuses: No Frontal/maxillary tenderness ENT/Mouth: Ext aud canals clear, TMs without erythema, bulging. No erythema, swelling, or exudate on post pharynx.  Tonsils not swollen or erythematous. Hearing normal.  Neck: Supple, thyroid normal.  Respiratory: Respiratory effort normal, BS equal bilaterally without rales, rhonchi, wheezing or stridor.  Cardio: RRR with no MRGs. Brisk peripheral pulses without edema.  Abdomen: Soft, + BS.  Non tender, no guarding, rebound, hernias, masses. Lymphatics: Non tender without lymphadenopathy.  Musculoskeletal: Full ROM, 5/5 strength, Normal gait Skin: Warm, dry without rashes, lesions, ecchymosis.  Neuro: Cranial nerves intact. No cerebellar symptoms.  Psych: Awake and oriented X 3, normal affect, Insight and Judgment  appropriate.    Caitlin Ribas, NP 1:53 PM [facility]

## 2020-01-06 ENCOUNTER — Ambulatory Visit: Payer: Managed Care, Other (non HMO) | Admitting: Adult Health

## 2020-01-08 IMAGING — CR DG CHEST 2V
2 series · 2 of 2 positions shown · non-contrast
Comparison: 07/29/2019

CLINICAL DATA: Chest pain and shortness of breath. Recent KL62O-ST
virus infection.

EXAM:
CHEST - 2 VIEW

[w chest pa]
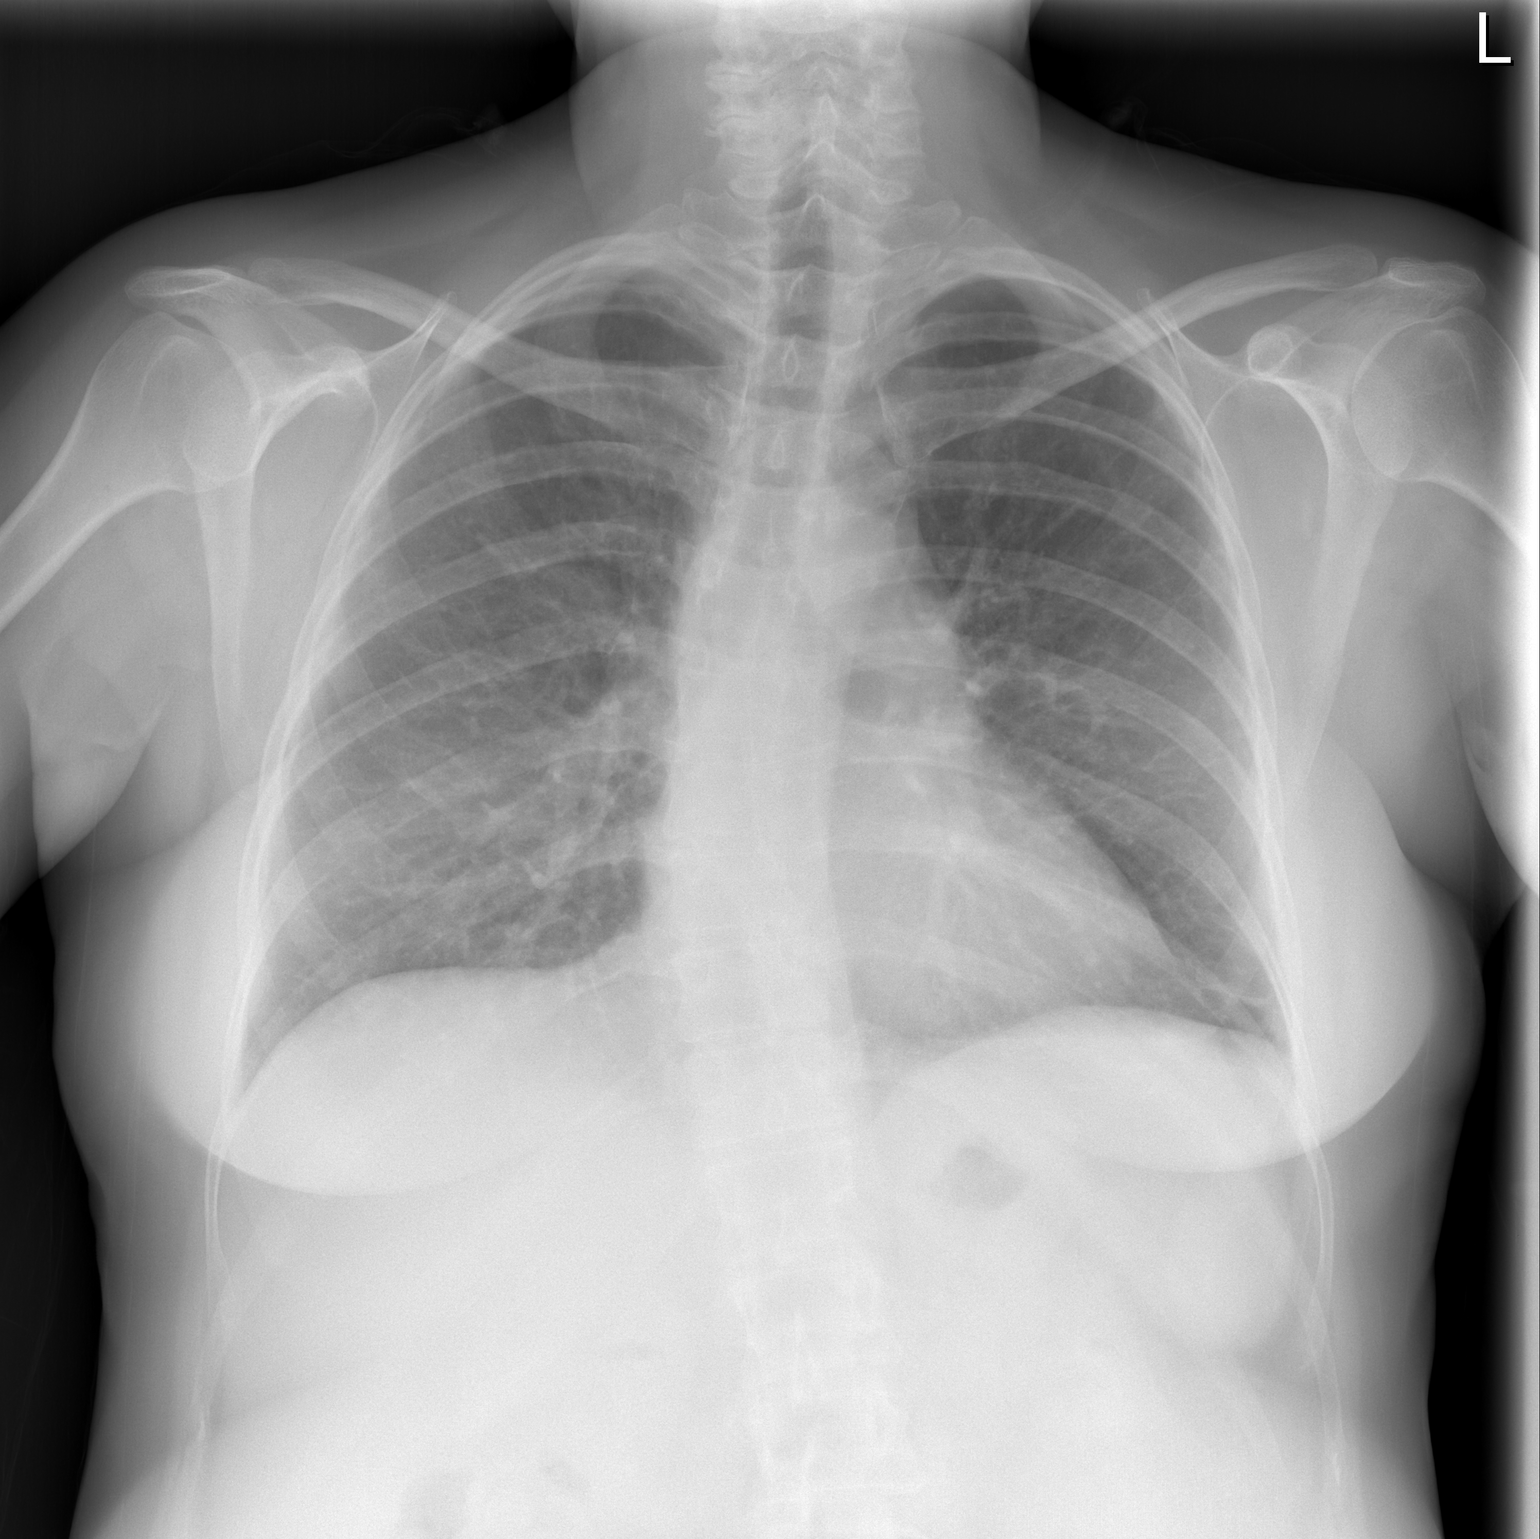

[w chest lat]
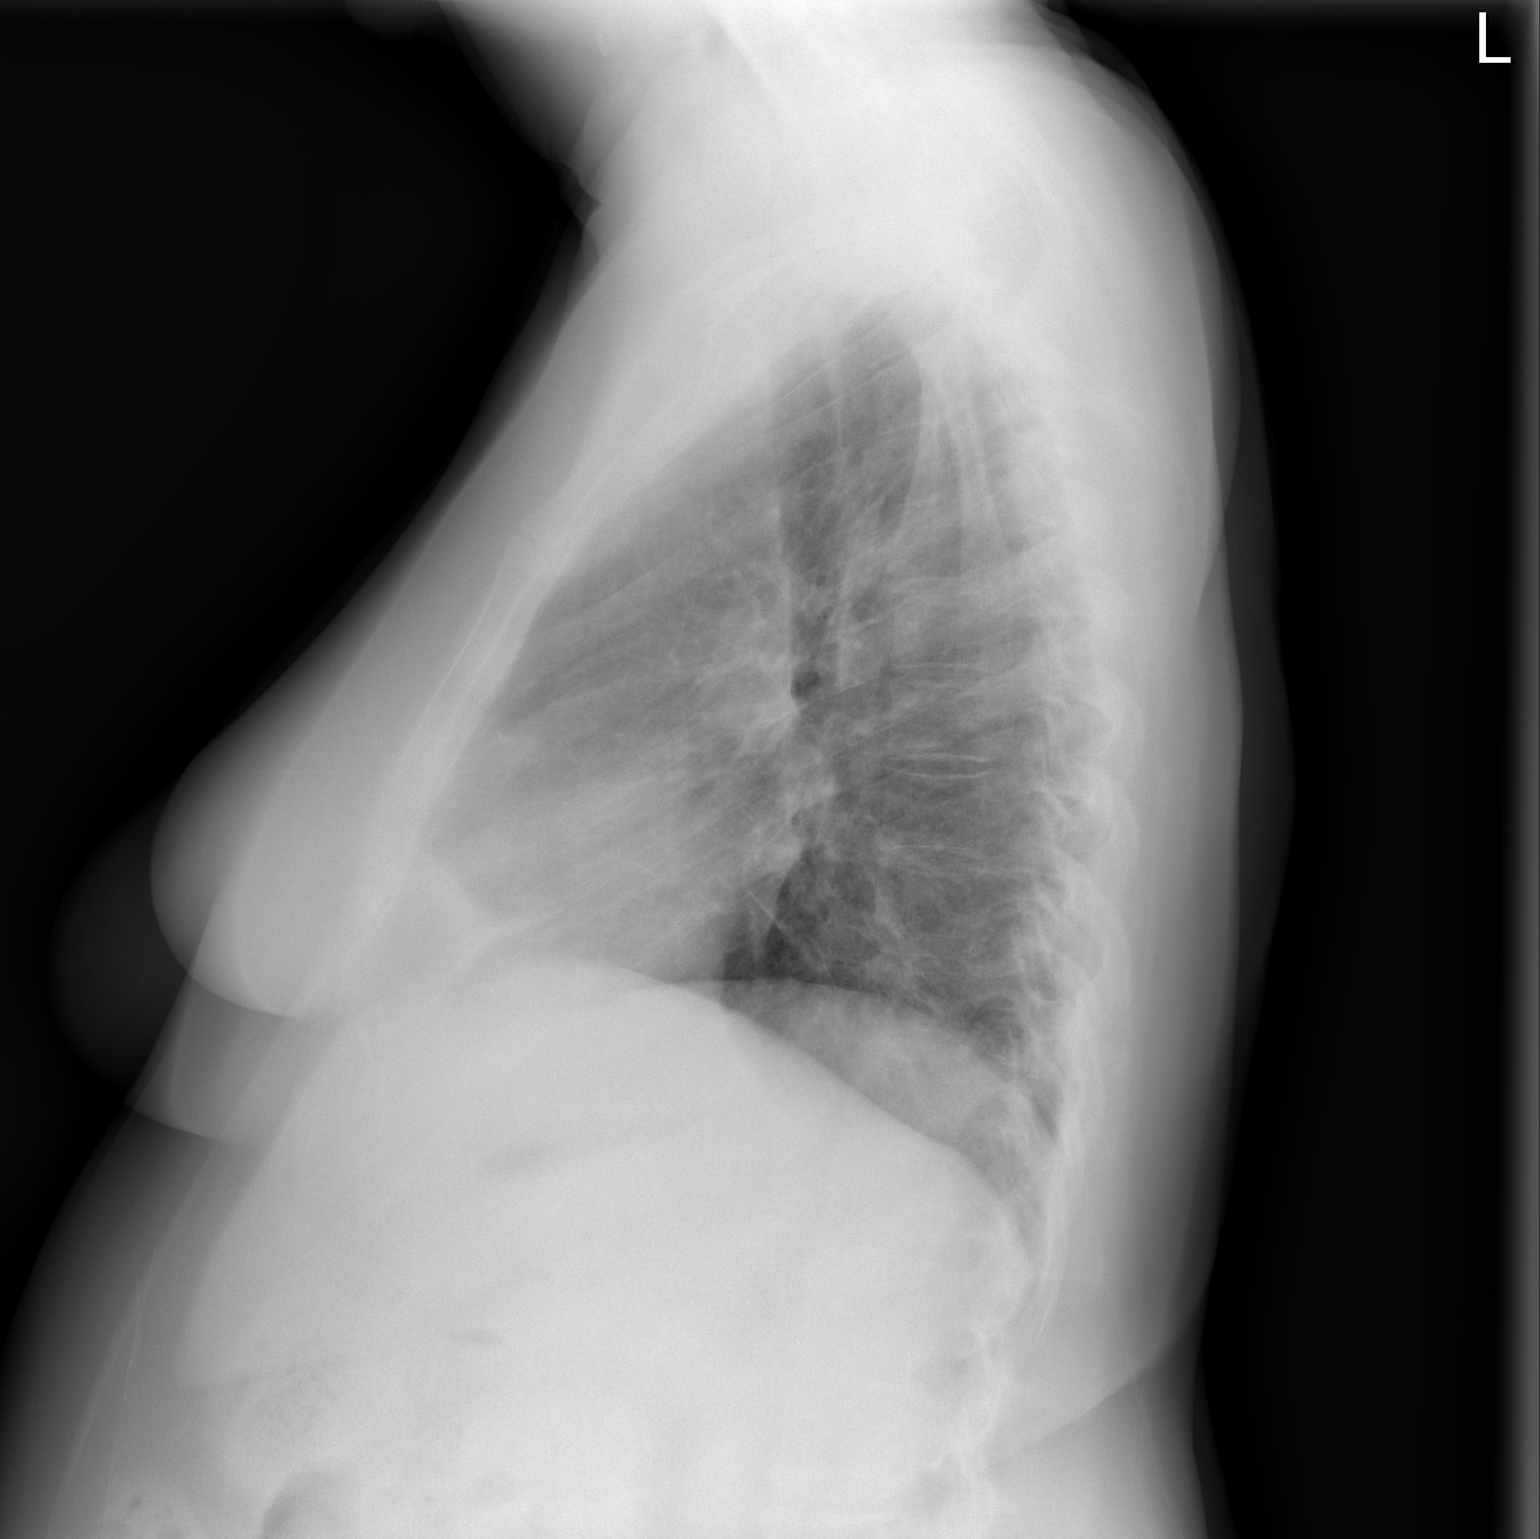

[2 of 2 positions shown; findings below may reference images not displayed]

FINDINGS: The heart size and mediastinal contours are within normal limits.
Previously seen bibasilar predominant pulmonary infiltrates have
resolved. No evidence of pleural effusion. The visualized skeletal
structures are unremarkable.
IMPRESSION: No active cardiopulmonary disease.

## 2020-01-27 ENCOUNTER — Ambulatory Visit: Payer: Managed Care, Other (non HMO) | Attending: Internal Medicine

## 2020-01-27 DIAGNOSIS — Z23 Encounter for immunization: Secondary | ICD-10-CM

## 2020-01-27 NOTE — Progress Notes (Signed)
   Covid-19 Vaccination Clinic  Name:  Caitlin Barnett    MRN: [id number] DOB: August 21, 1965  01/27/2020  [name] was observed post Covid-19 immunization for 15 minutes without incident. She was provided with Vaccine Information Sheet and instruction to access the V-Safe system.   [name] was instructed to call 911 with any severe reactions post vaccine: Marland Kitchen Difficulty breathing  . Swelling of face and throat  . A fast heartbeat  . A bad rash all over body  . Dizziness and weakness   Immunizations Administered    Name Date Dose VIS Date Route   Pfizer COVID-19 Vaccine 01/27/2020  4:54 PM 0.3 mL 10/06/2018 Intramuscular   Manufacturer: [employer], [employer]   Lot: [id number]   NDC: [address]-7

## 2020-02-10 ENCOUNTER — Other Ambulatory Visit: Payer: Self-pay | Admitting: Adult Health

## 2020-02-10 ENCOUNTER — Other Ambulatory Visit: Payer: Self-pay | Admitting: Cardiology

## 2020-02-10 DIAGNOSIS — E1140 Type 2 diabetes mellitus with diabetic neuropathy, unspecified: Secondary | ICD-10-CM

## 2020-02-12 ENCOUNTER — Other Ambulatory Visit: Payer: Self-pay | Admitting: Physician Assistant

## 2020-02-12 DIAGNOSIS — Z9109 Other allergy status, other than to drugs and biological substances: Secondary | ICD-10-CM

## 2020-03-08 ENCOUNTER — Other Ambulatory Visit: Payer: Self-pay | Admitting: Cardiology

## 2020-03-12 ENCOUNTER — Other Ambulatory Visit: Payer: Self-pay | Admitting: Physician Assistant

## 2020-03-12 DIAGNOSIS — K219 Gastro-esophageal reflux disease without esophagitis: Secondary | ICD-10-CM

## 2020-03-22 ENCOUNTER — Encounter: Payer: Self-pay | Admitting: Adult Health

## 2020-03-22 ENCOUNTER — Ambulatory Visit: Payer: Managed Care, Other (non HMO) | Admitting: Adult Health

## 2020-03-22 ENCOUNTER — Other Ambulatory Visit: Payer: Self-pay

## 2020-03-22 VITALS — BP 120/74 | HR 94 | Temp 97.7°F | Wt 176.0 lb

## 2020-03-22 DIAGNOSIS — E669 Obesity, unspecified: Secondary | ICD-10-CM

## 2020-03-22 DIAGNOSIS — E785 Hyperlipidemia, unspecified: Secondary | ICD-10-CM

## 2020-03-22 DIAGNOSIS — E039 Hypothyroidism, unspecified: Secondary | ICD-10-CM

## 2020-03-22 DIAGNOSIS — E538 Deficiency of other specified B group vitamins: Secondary | ICD-10-CM | POA: Diagnosis not present

## 2020-03-22 DIAGNOSIS — E1140 Type 2 diabetes mellitus with diabetic neuropathy, unspecified: Secondary | ICD-10-CM

## 2020-03-22 DIAGNOSIS — R0600 Dyspnea, unspecified: Secondary | ICD-10-CM | POA: Diagnosis not present

## 2020-03-22 DIAGNOSIS — Z79899 Other long term (current) drug therapy: Secondary | ICD-10-CM | POA: Diagnosis not present

## 2020-03-22 DIAGNOSIS — E1165 Type 2 diabetes mellitus with hyperglycemia: Secondary | ICD-10-CM

## 2020-03-22 DIAGNOSIS — R0989 Other specified symptoms and signs involving the circulatory and respiratory systems: Secondary | ICD-10-CM | POA: Diagnosis not present

## 2020-03-22 DIAGNOSIS — I5032 Chronic diastolic (congestive) heart failure: Secondary | ICD-10-CM

## 2020-03-22 DIAGNOSIS — E6609 Other obesity due to excess calories: Secondary | ICD-10-CM

## 2020-03-22 DIAGNOSIS — Z6831 Body mass index (BMI) 31.0-31.9, adult: Secondary | ICD-10-CM

## 2020-03-22 DIAGNOSIS — E1169 Type 2 diabetes mellitus with other specified complication: Secondary | ICD-10-CM

## 2020-03-22 DIAGNOSIS — R0609 Other forms of dyspnea: Secondary | ICD-10-CM | POA: Insufficient documentation

## 2020-03-22 MED ORDER — PREGABALIN 75 MG PO CAPS: ORAL_CAPSULE | ORAL | 1 refills | Status: DC

## 2020-03-22 NOTE — Patient Instructions (Signed)
Goals    . HEMOGLOBIN A1C < 7.0    . LDL CALC < 70    . Weight (lb) < 155 lb (70.3 kg)        Try breztri 1 puff twice a day, rinse mouth after using due to steroid to avoid thrush  Budesonide; Glycopyrrolate; Formoterol inhalation aerosol What is this medicine? BUDESONIDE; GLYCOPYRROLATE; FORMOTEROL (byoo DES oh nide; glye koe PYE roe late; for [facility] te rol) inhalation is a combination of 3 medicines. Budesonide decreases inflammation in the lungs. Formoterol and glycopyrrolate are bronchodilators that help keep airways open. This medicine is used to treat chronic obstructive pulmonary disease (COPD). Do not use this drug combination for acute COPD attacks or bronchospasm. This medicine may be used for other purposes; ask your health care provider or pharmacist if you have questions. COMMON BRAND NAME(S): BREZTRI What should I tell my health care provider before I take this medicine? They need to know if you have any of these conditions:  bladder problems or trouble passing urine  bone problems  diabetes  eye disease, vision problems  heart disease  high blood pressure  history of irregular heartbeat  immune system problems  infection  kidney disease  pheochromocytoma  prostate disease  seizures  thyroid disease  an unusual or allergic reaction to budesonide, formoterol, glycopyrrolate, medicines, foods, dyes, or preservatives  pregnant or trying to get pregnant  breast-feeding How should I use this medicine? This medicine is inhaled through the mouth. Follow the directions on the prescription label. Shake well before each use. Rinse your mouth with water after use. Make sure not to swallow the water. Take your medicine at regular intervals. Do not take your medicine more often than directed. Do not stop taking except on your doctor's advice. Make sure that you are using your inhaler correctly. This drug comes with INSTRUCTIONS FOR USE. Ask your pharmacist for  directions on how to use this drug. Read the information carefully. Talk to your pharmacist or health care provider if you have questions. Talk to your pediatrician regarding the use of this medicine in children. This medicine is not approved for use in children. Overdosage: If you think you have taken too much of this medicine contact a poison control center or emergency room at once. NOTE: This medicine is only for you. Do not share this medicine with others. What if I miss a dose? If you miss a dose, use it as soon as you can. If it is almost time for your next dose, use only that dose. Do not use double or extra doses. What may interact with this medicine? Do not take the medicine with any of the following medications:  cisapride  dofetilide  dronedarone  MAOIs like Marplan, Nardil, and Parnate  other medicines that contain long-acting beta-2 agonists (LABAs) like arformoterol, formoterol, indacaterol, olodaterol, salmeterol, vilanterol  pimozide  procarbazine  thioridazine This medicine may also interact with the following medications:  antihistamines for allergy, cough, and cold  atropine  certain antibiotics like clarithromycin, telithromycin  certain antivirals for HIV or hepatitis  certain heart medicines like atenolol, metoprolol  certain medicines for bladder problems like oxybutynin, tolterodine  certain medicines for blood pressure, heart disease, irregular heartbeat  certain medicines for depression, anxiety, or psychotic disturbances  certain medicines for fungal infections like ketoconazole, itraconazole  certain medicines for Parkinson's disease like benztropine, trihexyphenidyl  certain medicines for stomach problems like dicyclomine, hyoscyamine  certain medicines for travel sickness like scopolamine  diuretics  grapefruit juice  mifepristone  other inhaled medicines that contain anticholinergics such as aclidinium, ipratropium, tiotropium,  umeclidinium  other medicines that prolong the QT interval (an abnormal heart rhythm)  some vaccines  steroid medicines like prednisone or cortisone  stimulant medicines for attention disorders, weight loss, or to stay awake  theophylline This list may not describe all possible interactions. Give your health care provider a list of all the medicines, herbs, non-prescription drugs, or dietary supplements you use. Also tell them if you smoke, drink alcohol, or use illegal drugs. Some items may interact with your medicine. What should I watch for while using this medicine? Visit your health care professional for regular checks on your progress. Tell your health care professional if your symptoms do not start to get better or if they get worse. NEVER use this medicine for an acute asthma or COPD attack. You should use your short-acting rescue inhalers for this purpose. If your symptoms get worse or if you need your short-acting inhalers more often, call your doctor right away. This medicine may increase your risk of getting an infection. Tell your doctor or health care professional if you are around anyone with measles or chickenpox, or if you develop sores or blisters that do not heal properly. Do not get this medicine in your eyes. It can cause irritation, pain, or blurred vision. What side effects may I notice from receiving this medicine? Side effects that you should report to your doctor or health care professional as soon as possible:  allergic reactions like skin rash, itching or hives, swelling of the face, lips, or tongue  anxious  breathing problems  changes in vision, eye pain  muscle cramps or muscle pain  signs and symptoms of a dangerous change in heartbeat or heart rhythm like chest pain; dizziness; fast or irregular heartbeat; palpitations; feeling faint or lightheaded, falls; breathing problems  signs and symptoms of high blood sugar such as being more thirsty or hungry  or having to urinate more than normal. You may also feel very tired or have blurry vision  signs and symptoms of infection like fever; chills; cough; sore throat; pain or trouble passing urine  tremors  trouble passing urine or change in the amount of urine  unusually weak or tired  white patches in the mouth or mouth sores Side effects that usually do not require medical attention (report these to your doctor or health care professional if they continue or are bothersome):  back pain  changes in taste  cough  diarrhea  runny or stuffy nose  upset stomach This list may not describe all possible side effects. Call your doctor for medical advice about side effects. You may report side effects to FDA at 1-800-FDA-1088. Where should I keep my medicine? Keep out of the reach of children. Store in a dry place at room temperature between 15 and 30 degrees C (59 and 86 degrees F). Protect from heat. Do not freeze. Do not use or store near heat or flame, as the canister may burst. Throw away the inhaler 3 months after you open the foil pouch (for the 120-inhalation canister), or 3 weeks after you open the foil pouch (for the 28-inhalation canister), or when the dose indicator reaches zero "0", whichever comes first. NOTE: This sheet is a summary. It may not cover all possible information. If you have questions about this medicine, talk to your doctor, pharmacist, or health care provider.  2020 Elsevier/Gold Standard (2019-03-09 18:58:00)

## 2020-03-22 NOTE — Progress Notes (Signed)
FOLLOW UP  Assessment and Plan:   CHF Appears euvolemic today; Disease process and medications discussed. Questions answered fully. Emphasized salt restriction, less than '2000mg'$  a day. Encouraged daily monitoring of the patient's weight, call office if 5 lb weight loss or gain in a day.  Encouraged regular exercise. If any increasing shortness of breath, swelling, or chest pressure go to ER immediately.  decrease your fluid intake to less than 2 L daily please remember to always increase your potassium intake with any increase of your fluid pill.   Hypertension At goal;  Monitor blood pressure at home; patient to call if consistently greater than 130/80 Continue DASH diet.   Reminder to go to the ER if any CP, SOB, nausea, dizziness, severe HA, changes vision/speech, left arm numbness and tingling and jaw pain.  Cholesterol Currently above goal; on fenofibrate, pravastatin 20 mg, advised she start on omega 3/fish oil supplement for trigs, consider vascepa Continue low cholesterol diet and exercise.  Check lipid panel.   Diabetes with other circulatory complications Continue medication: metformin,  jardiance 25 mg daily Continue diet and exercise.  Perform daily foot/skin check, notify office of any concerning changes.  Check A1C  Obesity with co morbidities Long discussion about weight loss, diet, and exercise Recommended diet heavy in fruits and veggies and low in animal meats, cheeses, and dairy products, appropriate calorie intake Discussed ideal weight for height  Will follow up in 3 months   Hypothyroidism continue medications the same pending lab results reminded to take on an empty stomach 30-86mns before food.  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 Further disposition pending results of labs. Discussed med's effects and SE's.   Over 30 minutes of exam, counseling, chart review, and critical decision making was performed.   Future Appointments  Date Time Provider DBath 04/11/2020  4:00 PM MUnk Pinto MD [facility] None  10/31/2020  3:00 PM MUnk Pinto MD [facility] None    ----------------------------------------------------------------------------------------------------------------------  HPI 54y.o. female  presents for 3 month follow up on hypertension, cholesterol, diabetes, hypothyroid, obesity and vitamin D deficiency.   Patient was hospitalized Dec 13- Dec 20 with Covid Pneumonia and Acute respiratory failure and fortunately never required mechanical ventilation, was discharged and recovered slowly. Today she reports has significant residual exertional dyspnea. Was improving, then seems to be getting worse in the last month; she reports dyspnea just walking up the steps of her home. Will get short of breath while speaking during a meeting at work. Occasional tightness in chest resolves with albuterol, but no improvement with exertional tolerance. Mild non-productive cough in the last few weeks. She did have normal CXR following admission on 08/18/2019 and normal EKG on 10/06/2019.    Also persistent bil knee and hip pain and weakness (saw ortho, did have xrays, recommended PT  and was given home exercises for weakness). She does feel this helped some, but pain in knees and hips  limits her. Also has neuropathic distal burning, has been prescribed lyrica 75 mg caps, per Dr. Melford Aase instructions she reports taking 2 tabs AM and PM, 1 cap with lunch.   BMI is Body mass index is 32.19 kg/m., she has been working on diet and exercise, working on cutting carbs, has been unable to tolerate significant exertion, minimal exercise at this time Trying to make healthy choices, protein with veggies, does admit to potatoes and pasta Water only, no alcohol It is her birthday today and reports had cake earlier today, but this is atypical for her, minimal sweets Wt Readings from Last 3 Encounters:  03/22/20 176 lb (79.8 kg)  12/09/19 176 lb (79.8 kg)  11/05/19 176 lb (79.8 kg)   She was abdmitted in 11/2017 with LLL pneumonia; 2D ECHO showed normal LV function with grade 1 diastolic dysfunction. Remains on oral lasix 20 mg and well controlled, weights stable, minimal edema other than after increased sodium, weights stable, denies PND.   Her blood pressure has been controlled at home, today their BP is BP: 120/74  She does not workout. She denies chest pain, dizziness. She does have dyspnea with minimal exertion.    She is on cholesterol medication Pravastatin and Fenofibrate and denies myalgias. Her cholesterol is not at goal. The cholesterol last visit was:   Lab Results  Component Value Date   CHOL 133 10/06/2019   HDL 31 (L) 10/06/2019   [place]  10/06/2019     Comment:     . LDL cholesterol not calculated. Triglyceride levels greater than 400 mg/dL invalidate calculated LDL results. . Reference range: <100 . Desirable range <100 mg/dL for primary prevention;   <70 mg/dL for patients with CHD or diabetic patients  with > or = 2 CHD risk factors. Marland Kitchen LDL-C is now calculated using the Martin-Hopkins  calculation, which is a validated novel method providing  better accuracy than the Friedewald equation in the  estimation of LDL-C.  [name] et al. Annamaria Helling. 7322;025(42):  2061-2068  (http://education.[employer].com/faq/FAQ164)    TRIG 722 (H) 10/06/2019   CHOLHDL 4.3 10/06/2019    She has been working on diet and exercise for T2 diabetes treated by metformin 1000 mg BID, jardiance 25 mg and denies hyperglycemia, hypoglycemia , increased appetite, nausea, paresthesia of the feet, polydipsia, polyuria and visual disturbances.  She does check fasting glucose, ranges 110-160, this is typical for her Has glipizide to take, 5 mg if needed for atypical elevations Last A1C in the office was:  Lab Results  Component Value Date   HGBA1C 7.3 (H) 10/06/2019   She is on thyroid medication. Her medication was not changed last visit. Takes 125 mcg daily except skips Wednesday, unchanged for several years  Lab Results  Component Value Date   TSH 0.56 11/15/2019   Patient is on Vitamin D supplement.   Lab Results  Component Value Date   VD25OH 16 10/06/2019     She has been taking 1000 mcg monthly injections, since last low check added daily sublingual  Lab Results  Component Value Date   VITAMINB12 311 11/15/2019    Current Medications:  Current Outpatient Medications on File Prior to Visit  Medication Sig  . albuterol (PROVENTIL HFA;VENTOLIN HFA) 108 (90 Base) MCG/ACT inhaler 2 inhalations  15-20  minutes apart every 4 hours as needed to rescue Asthma (Patient taking differently: Inhale 2 puffs  into the lungs every 4 (four) hours as needed (asthma). Inhale 15-20  minutes apart)  . albuterol (PROVENTIL) (2.5 MG/3ML) 0.083% nebulizer solution Take 3 mLs (2.5 mg total) by nebulization every 6 (six) hours as needed for wheezing or shortness of breath.  Marland Kitchen aspirin EC 81 MG tablet Take 81 mg by mouth daily.  . blood glucose meter kit and supplies KIT Dispense based on patient and insurance preference. Use up to four times daily as directed. (FOR ICD-9 250.00, 250.01).  . butalbital-acetaminophen-caffeine (FIORICET) 50-325-40 MG tablet Take 0.5 tablets by mouth  every 4 (four) hours as needed for headache.  . cetirizine (ZYRTEC) 10 MG tablet Take 10 mg by mouth daily.  . Cholecalciferol 1000 units capsule Take 5,000 Units by mouth 2 (two) times daily.   . Cinnamon 500 MG TABS Take 2 capsules by mouth 2 (two) times daily.  . cyanocobalamin (,VITAMIN B-12,) 1000 MCG/ML injection INJECT 1 ML (1,000 MCG TOTAL) INTO THE MUSCLE EVERY 30 (THIRTY) DAYS.  Marland Kitchen estrogen, conjugated,-medroxyprogesterone (PREMPRO) 0.45-1.5 MG tablet TAKE 1 TABLET BY MOUTH EVERY DAY  . fenofibrate (TRICOR) 145 MG tablet Take 1 tablet Daily for Triglycerides (Blood Fats) (Patient taking differently: Take 145 mg by mouth daily. )  . furosemide (LASIX) 40 MG tablet Take 0.5-1 tablets (20-40 mg total) by mouth daily as needed. NEED OV.  Marland Kitchen glucose blood test strip Test blood sugar once daily  . ibuprofen (ADVIL,MOTRIN) 200 MG tablet Take 800 mg by mouth as needed for moderate pain.  Marland Kitchen JARDIANCE 25 MG TABS tablet TAKE 1 TABLET BY MOUTH DAILY FOR DIABETES  . levothyroxine (SYNTHROID) 125 MCG tablet Take 1 tablet daily except 1/2 tab on Sunday; take on an empty stomach with only water for 30 minutes & no Antacid meds, Calcium or Magnesium for 4 hours & avoid Biotin  . Magnesium 250 MG TABS Take 250 mg by mouth daily.  . metFORMIN (GLUCOPHAGE-XR) 500 MG 24 hr tablet Take 2 tablets   2 x /day   with Meals for Diabetes  . montelukast (SINGULAIR) 10 MG tablet TAKE 1 TABLET BY MOUTH DAILY FOR ALLERGIES  . omeprazole (PRILOSEC) 40 MG capsule TAKE 1 CAPSULE BY MOUTH EVERY DAY  . ondansetron (ZOFRAN) 4 MG tablet Take 1 tablet (4 mg total) by mouth every 6 (six) hours as needed for nausea.  . potassium chloride (KLOR-CON) 10 MEQ tablet TAKE 1 TABLET BY MOUTH EVERY DAY  . pravastatin (PRAVACHOL) 20 MG tablet Take 1 tablet at Bedtime for Cholesterol (Patient taking differently: Take 20 mg by mouth at bedtime. )  . pregabalin (LYRICA) 75 MG capsule Take 1 capsule 3 x /day for Neuropathy Pains  . traZODone  (DESYREL) 150 MG tablet Take 1/2 to 1 tablet 1 hour before Bedtime if needed for Sleep (G47.00)  . valACYclovir (VALTREX) 1000 MG tablet Take 1 tablet 3 x /day for Fever Blisters / Cold Sores  . neomycin-polymyxin-hydrocortisone (CORTISPORIN) 3.5-10000-1 OTIC suspension Use 4 to 6 drops to the affected ear 3 to 4 x /day (Patient not taking: Reported on 12/09/2019)   No current facility-administered medications on file prior to visit.     Allergies:  Allergies  Allergen Reactions  . Tetanus Toxoids Other (See Comments)    Injection site abcess  . Sulfa Antibiotics Hives    itching     Medical History:  Past Medical History:  Diagnosis Date  . COVID-19   . Diabetes mellitus without complication ([place])   . Diverticulitis   . GERD (  gastroesophageal reflux disease)   . Hyperlipidemia   . IBS (irritable bowel syndrome)   . Insomnia 12/10/2019  . Migraine   . Pneumonia 11/2017  . Thyroid disease   . UTI (urinary tract infection)    Family history- Reviewed and unchanged Social history- Reviewed and unchanged   Review of Systems:  Review of Systems  Constitutional: Positive for malaise/fatigue. Negative for chills, fever and weight loss.  HENT: Negative for congestion, ear discharge, ear pain, hearing loss, sore throat and tinnitus.   Eyes: Negative for blurred vision and double vision.  Respiratory: Positive for cough (mild, non-productive, intermittent ) and shortness of breath (with minimal exertion). Negative for hemoptysis, sputum production and wheezing.   Cardiovascular: Negative for chest pain, palpitations, orthopnea, claudication, leg swelling and PND.  Gastrointestinal: Negative for abdominal pain, blood in stool, constipation, diarrhea, heartburn, melena, nausea and vomiting.  Genitourinary: Negative.   Musculoskeletal: Positive for joint pain (bil knees, hips, saw ortho). Negative for myalgias.  Skin: Negative for rash.  Neurological: Negative for dizziness, tingling,  sensory change, focal weakness, weakness and headaches.  Endo/Heme/Allergies: Negative for polydipsia.  Psychiatric/Behavioral: Negative.   All other systems reviewed and are negative.   Physical Exam: BP 120/74   Pulse 94   Temp 97.7 F (36.5 C)   Wt 176 lb (79.8 kg)   SpO2 98%   BMI 32.19 kg/m  Wt Readings from Last 3 Encounters:  03/22/20 176 lb (79.8 kg)  12/09/19 176 lb (79.8 kg)  11/05/19 176 lb (79.8 kg)   General Appearance: Well nourished, in no apparent distress. Eyes: PERRLA, EOMs, conjunctiva no swelling or erythema Sinuses: No Frontal/maxillary tenderness ENT/Mouth: Ext aud canals clear, TMs without erythema, bulging. No erythema, swelling, or exudate on post pharynx.  Tonsils not swollen or erythematous. Hearing normal.  Neck: Supple, thyroid normal.  Respiratory: Respiratory effort normal, BS equal bilaterally without rales, rhonchi, wheezing or stridor.  Cardio: RRR with no MRGs. Brisk peripheral pulses without edema.  Abdomen: Soft, + BS.  Non tender, no guarding, rebound, hernias, masses. Lymphatics: Non tender without lymphadenopathy.   Musculoskeletal: Full ROM, no obvious deformity or effusion, symmetrical strength, Normal gait Skin: Warm, dry without rashes, lesions, ecchymosis.  Neuro: Cranial nerves intact. No cerebellar symptoms.  Psych: Awake and oriented X 3, normal affect, Insight and Judgment appropriate.    [name], NP 3:57 PM [facility]

## 2020-03-23 ENCOUNTER — Other Ambulatory Visit: Payer: Self-pay | Admitting: Adult Health

## 2020-03-23 ENCOUNTER — Encounter: Payer: Self-pay | Admitting: Adult Health

## 2020-03-23 DIAGNOSIS — R0600 Dyspnea, unspecified: Secondary | ICD-10-CM

## 2020-03-23 DIAGNOSIS — Z8616 Personal history of COVID-19: Secondary | ICD-10-CM

## 2020-03-23 DIAGNOSIS — Q788 Other specified osteochondrodysplasias: Secondary | ICD-10-CM

## 2020-03-23 DIAGNOSIS — R0609 Other forms of dyspnea: Secondary | ICD-10-CM

## 2020-03-23 DIAGNOSIS — Z87891 Personal history of nicotine dependence: Secondary | ICD-10-CM

## 2020-03-23 LAB — VITAMIN B12: Vitamin B-12: >2000 pg/mL — ABNORMAL HIGH (ref 200–1100)

## 2020-03-23 LAB — CBC WITH DIFFERENTIAL/PLATELET
Absolute Monocytes: 530 cells/uL (ref 200–950)
Basophils Absolute: 51 cells/uL (ref 0–200)
Basophils Relative: 0.9 %
Eosinophils Absolute: 182 cells/uL (ref 15–500)
Eosinophils Relative: 3.2 %
HCT: 40.3 % (ref 35.0–45.0)
Hemoglobin: 13.5 g/dL (ref 11.7–15.5)
Lymphs Abs: 2326 cells/uL (ref 850–3900)
MCH: 30.5 pg (ref 27.0–33.0)
MCHC: 33.5 g/dL (ref 32.0–36.0)
MCV: 91 fL (ref 80.0–100.0)
MPV: 10.7 fL (ref 7.5–12.5)
Monocytes Relative: 9.3 %
Neutro Abs: 2611 cells/uL (ref 1500–7800)
Neutrophils Relative %: 45.8 %
Platelets: 319 10*3/uL (ref 140–400)
RBC: 4.43 10*6/uL (ref 3.80–5.10)
RDW: 13.3 % (ref 11.0–15.0)
Total Lymphocyte: 40.8 %
WBC: 5.7 10*3/uL (ref 3.8–10.8)

## 2020-03-23 LAB — COMPLETE METABOLIC PANEL WITH GFR
AG Ratio: 2.1 (calc) (ref 1.0–2.5)
ALT: 24 U/L (ref 6–29)
AST: 30 U/L (ref 10–35)
Albumin: 4.4 g/dL (ref 3.6–5.1)
Alkaline phosphatase (APISO): 109 U/L (ref 37–153)
BUN: 16 mg/dL (ref 7–25)
CO2: 27 mmol/L (ref 20–32)
Calcium: 10.6 mg/dL — ABNORMAL HIGH (ref 8.6–10.4)
Chloride: 103 mmol/L (ref 98–110)
Creat: 0.58 mg/dL (ref 0.50–1.05)
GFR, Est African American: 122 mL/min/{1.73_m2} (ref >=60)
GFR, Est Non African American: 105 mL/min/{1.73_m2} (ref >=60)
Globulin: 2.1 g/dL (calc) (ref 1.9–3.7)
Glucose, Bld: 127 mg/dL — ABNORMAL HIGH (ref 65–99)
Potassium: 3.9 mmol/L (ref 3.5–5.3)
Sodium: 140 mmol/L (ref 135–146)
Total Bilirubin: 0.3 mg/dL (ref 0.2–1.2)
Total Protein: 6.5 g/dL (ref 6.1–8.1)

## 2020-03-23 LAB — D-DIMER, QUANTITATIVE: D-Dimer, Quant: 0.24 mcg/mL FEU (ref <0.50)

## 2020-03-23 LAB — LIPID PANEL
Cholesterol: 103 mg/dL (ref <200)
HDL: 33 mg/dL — ABNORMAL LOW (ref >=50)
LDL Cholesterol (Calc): 33 mg/dL (calc)
Non-HDL Cholesterol (Calc): 70 mg/dL (calc) (ref <130)
Total CHOL/HDL Ratio: 3.1 (calc) (ref <5.0)
Triglycerides: 356 mg/dL — ABNORMAL HIGH (ref <150)

## 2020-03-23 LAB — HEMOGLOBIN A1C
Hgb A1c MFr Bld: 7.2 % of total Hgb — ABNORMAL HIGH (ref <5.7)
Mean Plasma Glucose: 160 (calc)
eAG (mmol/L): 8.9 (calc)

## 2020-03-23 LAB — TSH: TSH: 0.27 mIU/L — ABNORMAL LOW

## 2020-03-23 LAB — MAGNESIUM: Magnesium: 1.9 mg/dL (ref 1.5–2.5)

## 2020-03-23 MED ORDER — ICOSAPENT ETHYL 1 G PO CAPS: 1.0000 g | ORAL_CAPSULE | Freq: Two times a day (BID) | ORAL | 1 refills | Status: DC

## 2020-03-28 ENCOUNTER — Other Ambulatory Visit: Payer: Self-pay | Admitting: Adult Health

## 2020-03-28 MED ORDER — MELOXICAM 15 MG PO TABS
ORAL_TABLET | ORAL | 0 refills | Status: DC
Start: 2020-03-28 — End: 2020-06-20

## 2020-03-30 ENCOUNTER — Other Ambulatory Visit: Payer: Managed Care, Other (non HMO)

## 2020-03-31 ENCOUNTER — Other Ambulatory Visit: Payer: Self-pay | Admitting: Adult Health

## 2020-03-31 DIAGNOSIS — R0600 Dyspnea, unspecified: Secondary | ICD-10-CM

## 2020-03-31 DIAGNOSIS — Z8709 Personal history of other diseases of the respiratory system: Secondary | ICD-10-CM

## 2020-03-31 DIAGNOSIS — R0609 Other forms of dyspnea: Secondary | ICD-10-CM

## 2020-03-31 DIAGNOSIS — Z8616 Personal history of COVID-19: Secondary | ICD-10-CM

## 2020-04-09 ENCOUNTER — Other Ambulatory Visit: Payer: Self-pay

## 2020-04-09 DIAGNOSIS — E1140 Type 2 diabetes mellitus with diabetic neuropathy, unspecified: Secondary | ICD-10-CM

## 2020-04-10 MED ORDER — METFORMIN HCL ER 500 MG PO TB24: ORAL_TABLET | ORAL | 0 refills | Status: DC

## 2020-04-11 ENCOUNTER — Ambulatory Visit: Payer: Managed Care, Other (non HMO) | Admitting: Internal Medicine

## 2020-05-09 ENCOUNTER — Institutional Professional Consult (permissible substitution): Payer: Managed Care, Other (non HMO) | Admitting: Internal Medicine

## 2020-05-11 ENCOUNTER — Other Ambulatory Visit: Payer: Self-pay | Admitting: Internal Medicine

## 2020-05-11 DIAGNOSIS — E039 Hypothyroidism, unspecified: Secondary | ICD-10-CM

## 2020-05-31 ENCOUNTER — Encounter: Payer: Self-pay | Admitting: Adult Health

## 2020-05-31 ENCOUNTER — Ambulatory Visit
Admission: RE | Admit: 2020-05-31 | Discharge: 2020-05-31 | Disposition: A | Discharge status: Home / Self Care | Payer: Managed Care, Other (non HMO) | Source: Ambulatory Visit | Attending: Adult Health | Admitting: Adult Health

## 2020-05-31 ENCOUNTER — Other Ambulatory Visit: Payer: Self-pay

## 2020-05-31 ENCOUNTER — Other Ambulatory Visit: Payer: Self-pay | Admitting: Adult Health

## 2020-05-31 ENCOUNTER — Ambulatory Visit: Payer: Managed Care, Other (non HMO) | Admitting: Adult Health

## 2020-05-31 VITALS — BP 110/72 | HR 71 | Temp 96.3°F | Wt 176.0 lb

## 2020-05-31 DIAGNOSIS — R079 Chest pain, unspecified: Secondary | ICD-10-CM

## 2020-05-31 LAB — COMPLETE METABOLIC PANEL WITH GFR
AG Ratio: 2 (calc) (ref 1.0–2.5)
ALT: 29 U/L (ref 6–29)
AST: 37 U/L — ABNORMAL HIGH (ref 10–35)
Albumin: 4.5 g/dL (ref 3.6–5.1)
Alkaline phosphatase (APISO): 101 U/L (ref 37–153)
BUN: 21 mg/dL (ref 7–25)
CO2: 28 mmol/L (ref 20–32)
Calcium: 9.7 mg/dL (ref 8.6–10.4)
Chloride: 102 mmol/L (ref 98–110)
Creat: 0.7 mg/dL (ref 0.50–1.05)
GFR, Est African American: 115 mL/min/{1.73_m2} (ref >=60)
GFR, Est Non African American: 99 mL/min/{1.73_m2} (ref >=60)
Globulin: 2.2 g/dL (calc) (ref 1.9–3.7)
Glucose, Bld: 150 mg/dL — ABNORMAL HIGH (ref 65–99)
Potassium: 4.3 mmol/L (ref 3.5–5.3)
Sodium: 138 mmol/L (ref 135–146)
Total Bilirubin: 0.4 mg/dL (ref 0.2–1.2)
Total Protein: 6.7 g/dL (ref 6.1–8.1)

## 2020-05-31 LAB — CBC WITH DIFFERENTIAL/PLATELET
Absolute Monocytes: 612 cells/uL (ref 200–950)
Basophils Absolute: 48 cells/uL (ref 0–200)
Basophils Relative: 0.7 %
Eosinophils Absolute: 170 cells/uL (ref 15–500)
Eosinophils Relative: 2.5 %
HCT: 42.1 % (ref 35.0–45.0)
Hemoglobin: 13.9 g/dL (ref 11.7–15.5)
Lymphs Abs: 2203 cells/uL (ref 850–3900)
MCH: 29.8 pg (ref 27.0–33.0)
MCHC: 33 g/dL (ref 32.0–36.0)
MCV: 90.3 fL (ref 80.0–100.0)
MPV: 10.8 fL (ref 7.5–12.5)
Monocytes Relative: 9 %
Neutro Abs: 3767 cells/uL (ref 1500–7800)
Neutrophils Relative %: 55.4 %
Platelets: 326 10*3/uL (ref 140–400)
RBC: 4.66 10*6/uL (ref 3.80–5.10)
RDW: 12.9 % (ref 11.0–15.0)
Total Lymphocyte: 32.4 %
WBC: 6.8 10*3/uL (ref 3.8–10.8)

## 2020-05-31 LAB — SEDIMENTATION RATE: Sed Rate: 2 mm/h (ref 0–30)

## 2020-05-31 MED ORDER — CYCLOBENZAPRINE HCL 5 MG PO TABS: 5.0000 mg | ORAL_TABLET | Freq: Three times a day (TID) | ORAL | 0 refills | Status: DC | PRN

## 2020-05-31 MED ORDER — PREDNISONE 20 MG PO TABS: ORAL_TABLET | ORAL | 0 refills | Status: AC

## 2020-05-31 NOTE — Patient Instructions (Signed)

## 2020-05-31 NOTE — Progress Notes (Signed)
Assessment and Plan:  [place] was seen today for acute visit.  Diagnoses and all orders for this visit:  Right-sided chest pain Hemodynamically stable VS and on exam, not in acute distress other than significant pain HPI and exam notable for pain worse with deep breathing, cough (rare), and R shoulder external rotation; low suspicion of cardiac etiology, doubt dissection or similar; no GI sx or tenderness; EKG was normal without ST changes or evidence of strain Suspect either pulm etiology, possible MSK Main concern to r/o PE - check STAT D dimer and CXR - were negative Check labs; thus far all excepting CRP are resulted and unremarkable Will send in steroid taper and per her request flexeril for possible MSK etiology  She will follow up over next few days with progress with above treatment Continue ED precautions if worsening pain with any fatigue, dyspnea  -     CBC with Differential/Platelet -     D-dimer, quantitative (not at [facility]) -     Sedimentation rate -     C-reactive protein -     DG Chest 2 View; Future -     EKG 12-Lead -     COMPLETE METABOLIC PANEL WITH GFR  Further disposition pending results of labs. Discussed med's effects and SE's.   Over 30 minutes of exam, counseling, chart review, and critical decision making was performed.   Future Appointments  Date Time Provider [place]  06/27/2020  4:30 PM Unk Pinto, MD [facility] None  10/31/2020  3:00 PM Unk Pinto, MD [facility] None    ------------------------------------------------------------------------------------------------------------------   HPI BP 110/72   Pulse 71   Temp (!) 96.3 F (35.7 C)   Wt 176 lb (79.8 kg)   SpO2 96%   BMI 32.19 kg/m   54 y.o.female former smoker with T2DM, CHF presents for evaluation of chest through back pain that began last night, woke her up. She believes is pleurisy, had this many years ago and feels very similar, though worse. Pain is to right of  sternum, very sharp, shoots through to mid back constantly. 8/10, constant, worse with deep breaths and moving R arm. She does endorse some dyspnea but believes this is due to taking shallow breaths. She does endorse an occasional dry cough, VERY painful. Tried meloxicam without benefit.   Denies N/V, reflux (on omeprazole, well controlled), denies atypical food. Did travel to [place] a few weeks ago. Denies calf/leg pain, edema. Denies upper extremity pain. Denies fever/chills, rash, wheezing, sore throat, HA, dizziness, vision changes, palpitations.   She debated going to ED but wanted to avoid due to covid 19; vaccinated, had severe case requiring admission Dec 2020, has seen long covid clinic. She has been monitoring O2 sats and reports have been stable.    Past Medical History:  Diagnosis Date  . COVID-19   . Diabetes mellitus without complication ([place])   . Diverticulitis   . GERD (gastroesophageal reflux disease)   . Hyperlipidemia   . IBS (irritable bowel syndrome)   . Insomnia 12/10/2019  . Migraine   . Pneumonia 11/2017  . Thyroid disease   . UTI (urinary tract infection)      Allergies  Allergen Reactions  . Tetanus Toxoids Other (See Comments)    Injection site abcess  . Sulfa Antibiotics Hives    itching    Current Outpatient Medications on File Prior to Visit  Medication Sig  . albuterol (PROVENTIL HFA;VENTOLIN HFA) 108 (90 Base) MCG/ACT inhaler 2 inhalations  15-20  minutes  apart every 4 hours as needed to rescue Asthma (Patient taking differently: Inhale 2 puffs into the lungs every 4 (four) hours as needed (asthma). Inhale 15-20  minutes apart)  . albuterol (PROVENTIL) (2.5 MG/3ML) 0.083% nebulizer solution Take 3 mLs (2.5 mg total) by nebulization every 6 (six) hours as needed for wheezing or shortness of breath.  Marland Kitchen aspirin EC 81 MG tablet Take 81 mg by mouth daily.  . blood glucose meter kit and supplies KIT Dispense based on patient and insurance preference. Use up  to four times daily as directed. (FOR ICD-9 250.00, 250.01).  . butalbital-acetaminophen-caffeine (FIORICET) 50-325-40 MG tablet Take 0.5 tablets by mouth every 4 (four) hours as needed for headache.  . cetirizine (ZYRTEC) 10 MG tablet Take 10 mg by mouth daily.  . Cholecalciferol 1000 units capsule Take 5,000 Units by mouth 2 (two) times daily.   . Cinnamon 500 MG TABS Take 2 capsules by mouth 2 (two) times daily.  . cyanocobalamin (,VITAMIN B-12,) 1000 MCG/ML injection INJECT 1 ML (1,000 MCG TOTAL) INTO THE MUSCLE EVERY 30 (THIRTY) DAYS.  Marland Kitchen estrogen, conjugated,-medroxyprogesterone (PREMPRO) 0.45-1.5 MG tablet TAKE 1 TABLET BY MOUTH EVERY DAY  . fenofibrate (TRICOR) 145 MG tablet Take 1 tablet Daily for Triglycerides (Blood Fats) (Patient taking differently: Take 145 mg by mouth daily. )  . furosemide (LASIX) 40 MG tablet Take 0.5-1 tablets (20-40 mg total) by mouth daily as needed. NEED OV.  Marland Kitchen glucose blood test strip Test blood sugar once daily  . ibuprofen (ADVIL,MOTRIN) 200 MG tablet Take 800 mg by mouth as needed for moderate pain.  Marland Kitchen icosapent Ethyl (VASCEPA) 1 g capsule Take 1 capsule (1 g total) by mouth 2 (two) times daily.  Marland Kitchen JARDIANCE 25 MG TABS tablet TAKE 1 TABLET BY MOUTH DAILY FOR DIABETES  . levothyroxine (SYNTHROID) 125 MCG tablet TAKE 1 TAB DAILY ON AN EMPTY STOMACH WITH ONLY WATER FOR 30 MINUTES & NO ANTACID MEDS, CALCIUM OR MAGNESIUM FOR 4 HOURS & AVOID BIOTIN  . Magnesium 250 MG TABS Take 250 mg by mouth daily.  . meloxicam (MOBIC) 15 MG tablet Take one daily with food for 2 weeks, can take with tylenol, can not take with aleve, iburpofen, then 1/2- 1 tab as needed daily for pain. Take with food.  . metFORMIN (GLUCOPHAGE-XR) 500 MG 24 hr tablet Take 2 tablets   2 x /day   with Meals for Diabetes  . montelukast (SINGULAIR) 10 MG tablet TAKE 1 TABLET BY MOUTH DAILY FOR ALLERGIES  . omeprazole (PRILOSEC) 40 MG capsule TAKE 1 CAPSULE BY MOUTH EVERY DAY  . ondansetron (ZOFRAN) 4  MG tablet Take 1 tablet (4 mg total) by mouth every 6 (six) hours as needed for nausea.  . potassium chloride (KLOR-CON) 10 MEQ tablet TAKE 1 TABLET BY MOUTH EVERY DAY  . pravastatin (PRAVACHOL) 20 MG tablet Take 1 tablet (20 mg total) by mouth at bedtime.  . pregabalin (LYRICA) 75 MG capsule Take 1-2 capsule 3 x /day for Neuropathy Pains  . traZODone (DESYREL) 150 MG tablet Take 1/2 to 1 tablet 1 hour before Bedtime if needed for Sleep (G47.00)  . valACYclovir (VALTREX) 1000 MG tablet Take 1 tablet 3 x /day for Fever Blisters / Cold Sores  . neomycin-polymyxin-hydrocortisone (CORTISPORIN) 3.5-10000-1 OTIC suspension Use 4 to 6 drops to the affected ear 3 to 4 x /day (Patient not taking: Reported on 12/09/2019)   No current facility-administered medications on file prior to visit.    ROS: all negative  except above.   Physical Exam:  BP 110/72   Pulse 71   Temp (!) 96.3 F (35.7 C)   Wt 176 lb (79.8 kg)   SpO2 96%   BMI 32.19 kg/m   General Appearance: Well nourished, ob in no apparent distress. Eyes: PERRLA, EOMs, conjunctiva no swelling or erythema Sinuses: No Frontal/maxillary tenderness ENT/Mouth: Ext aud canals clear, TMs without erythema, bulging. No erythema, swelling, or exudate on post pharynx.  Tonsils not swollen or erythematous. Hearing normal.  Neck: Supple, thyroid normal. Trachea midline Respiratory: Respiratory effort normal, taking shallow breaths though not tachypneic,BS equal bilaterally without rales, rhonchi, wheezing or stridor.  Cardio: RRR with no MRGs. Brisk peripheral pulses without edema. No JVD.  Abdomen: Soft, + BS.  Non tender, no guarding, rebound, hernias, masses. Lymphatics: Non tender without lymphadenopathy.  Musculoskeletal: Full ROM, 5/5 strength (pain in R mid chest with R shoulder external rotation), no midline spine or paraspinal pain, no significant costal/chrondral tenderness, normal gait.  Skin: Warm, dry without rashes, lesions, ecchymosis.   Neuro: Normal muscle tone, no cerebellar symptoms. Sensation intact.  Psych: Awake and oriented X 3, normal affect, Insight and Judgment appropriate.     [name], NP 9:54 AM [facility]

## 2020-06-02 LAB — C-REACTIVE PROTEIN: CRP: 13.1 mg/L — ABNORMAL HIGH (ref <8.0)

## 2020-06-02 LAB — D-DIMER, QUANTITATIVE: D-Dimer, Quant: 0.22 mcg/mL FEU (ref <0.50)

## 2020-06-18 ENCOUNTER — Other Ambulatory Visit: Payer: Self-pay | Admitting: Internal Medicine

## 2020-06-20 ENCOUNTER — Other Ambulatory Visit: Payer: Self-pay | Admitting: Adult Health

## 2020-06-27 ENCOUNTER — Other Ambulatory Visit: Payer: Self-pay

## 2020-06-27 ENCOUNTER — Encounter: Payer: Self-pay | Admitting: Internal Medicine

## 2020-06-27 ENCOUNTER — Ambulatory Visit: Payer: Managed Care, Other (non HMO) | Admitting: Internal Medicine

## 2020-06-27 VITALS — BP 102/76 | HR 88 | Temp 97.8°F | Resp 16 | Ht 62.0 in | Wt 178.2 lb

## 2020-06-27 DIAGNOSIS — J0100 Acute maxillary sinusitis, unspecified: Secondary | ICD-10-CM

## 2020-06-27 DIAGNOSIS — R079 Chest pain, unspecified: Secondary | ICD-10-CM | POA: Diagnosis not present

## 2020-06-27 DIAGNOSIS — E785 Hyperlipidemia, unspecified: Secondary | ICD-10-CM | POA: Diagnosis not present

## 2020-06-27 DIAGNOSIS — R0989 Other specified symptoms and signs involving the circulatory and respiratory systems: Secondary | ICD-10-CM

## 2020-06-27 DIAGNOSIS — Z79899 Other long term (current) drug therapy: Secondary | ICD-10-CM | POA: Diagnosis not present

## 2020-06-27 DIAGNOSIS — E1140 Type 2 diabetes mellitus with diabetic neuropathy, unspecified: Secondary | ICD-10-CM

## 2020-06-27 DIAGNOSIS — E559 Vitamin D deficiency, unspecified: Secondary | ICD-10-CM | POA: Diagnosis not present

## 2020-06-27 DIAGNOSIS — J0410 Acute tracheitis without obstruction: Secondary | ICD-10-CM

## 2020-06-27 DIAGNOSIS — E039 Hypothyroidism, unspecified: Secondary | ICD-10-CM

## 2020-06-27 DIAGNOSIS — E1169 Type 2 diabetes mellitus with other specified complication: Secondary | ICD-10-CM

## 2020-06-27 MED ORDER — DEXAMETHASONE 1 MG PO TABS: ORAL_TABLET | ORAL | 0 refills | Status: DC

## 2020-06-27 MED ORDER — PROMETHAZINE-CODEINE 6.25-10 MG/5ML PO SYRP: ORAL_SOLUTION | ORAL | 1 refills | Status: DC

## 2020-06-27 MED ORDER — AZITHROMYCIN 250 MG PO TABS: ORAL_TABLET | ORAL | 1 refills | Status: DC

## 2020-06-27 NOTE — Progress Notes (Signed)
History of Present Illness:       This very nice 54 y.o.  MWF presents for 3 month follow up with HTN, HLD, Pre-Diabetes and Vitamin D Deficiency.  Patient also relates about a 1 week prodrome of head & chest congestion.      Patient is followed expectantly for labile  HTN & BP has been controlled at home. Today's BP: 102/76. Patient has had no complaints of any cardiac type chest pain, palpitations, dyspnea / orthopnea / PND, dizziness, claudication, or dependent edema.      Hyperlipidemia is controlled with diet & meds. Patient denies myalgias or other med SE's. Last Lipids were at goal except elevated Trig's:  Lab Results  Component Value Date   CHOL 103 03/22/2020   HDL 33 (L) 03/22/2020   LDLCALC 33 03/22/2020   TRIG 356 (H) 03/22/2020   CHOLHDL 3.1 03/22/2020    Also, the patient  has hx/o  Gestational Diabetes (1993 & 1996), then relapsed  developing T2_NIDDM (2016) & was started on Metformin. Patient denies symptoms of reactive hypoglycemia, diabetic polys, paresthesias or visual blurring.   She reports recent CBG's are usually less than 140 mg% and last A1c was not at goal:  Lab Results  Component Value Date   HGBA1C 7.2 (H) 03/22/2020       Further, the patient also has history of Vitamin D Deficiency ("8" /2018) and supplements vitamin D without any suspected side-effects. Last vitamin D was near goal:  Lab Results  Component Value Date   VD25OH 57 10/06/2019    Current Outpatient Medications on File Prior to Visit  Medication Sig  . albuterol HFA inhaler 2 inhalations  15-20  min every 4 hours to rescue Asthma   . albuterol (PROVENTIL) (2.5 MG/3ML) 0.083% neb solun Take 3 mLs  by neb every 6 hours as needed for wheezing or shortness of breath.  Marland Kitchen aspirin EC 81 MG tablet Take  daily.  Marland Kitchen FIORICET  Take 0.5 tablets  every 4  hours as needed for headache.  . cetirizine 10 MG tablet Take  daily.  . Cholecalciferol 1000 units capsule Take 5,000 Units  2  times  daily.   . Cinnamon 500 MG TABS Take 2 capsules  2  times daily.  Marland Kitchen VIT B-12 1000 MCG/ML injec INJECT 1 ML  IM EVERY 30  DAYS.  Marland Kitchen cyclobenzaprine5 MG tablet Take 1 tablet 3  times daily as needed   . estrogen, PREMPRO   0.45-1.5 MG tab TAKE 1 TABLET EVERY DAY  . fenofibrate  145 MG tablet TAKE 1 TABLET DAILY   . furosemide  40 MG tablet Take 0.5-1 tablets  daily as needed  . icosapent (VASCEPA) 1 g capsule Take 1 capsule 2  times daily.  Marland Kitchen JARDIANCE 25 MG TABS TAKE 1 TABLET  DAILY   . levothyroxine  125 MCG tabl TAKE 1 TAB DAILY   . Magnesium 250 MG TABS Take  daily.  . meloxicam  15 MG tablet Take    1/2 to 1 tablet      Daily    . metFORMIN-XR 500 MG  Take 2 tablets   2 x /day   with Meals for Diabetes  . montelukast  10 MG tablet TAKE 1 TABLET DAILY  . omeprazole  40 MG capsule TAKE 1 CAPSULE EVERY DAY  . ondansetron  4 MG tablet Take 1 tablet every 6  hours as needed   . potassium chl 10 MEQ  tab TAKE 1 TABLET EVERY DAY  . pravastatin  20 MG tablet Take 1 tablet at bedtime.  . pregabalin 75 MG capsule Take 1-2 capsule 3 x /day for Neuropathy Pains  . traZODone 150 MG tablet Take 1/2 to 1 tablet 1 hour before Bedtime   . valACYclovir  1000 MG tab Take 1 tablet 3 x /day for Fever Blisters /Cold Sores    Allergies  Allergen Reactions  . Tetanus Toxoids Other (See Comments)    Injection site abcess  . Sulfa Antibiotics Hives    itching    PMHx:   Past Medical History:  Diagnosis Date  . COVID-19   . Diabetes mellitus without complication ([facility])   . Diverticulitis   . GERD (gastroesophageal reflux disease)   . Hyperlipidemia   . IBS (irritable bowel syndrome)   . Insomnia 12/10/2019  . Migraine   . Pneumonia 11/2017  . Thyroid disease   . UTI (urinary tract infection)     Immunization History  Administered Date(s) Administered  . Influenza Inj Mdck Quad With Preservative 07/22/2017, 05/14/2018  . PFIZER SARS-COV-2 Vaccination 12/30/2019, 01/27/2020  . Pneumococcal  Polysaccharide-23 08/20/2018    Past Surgical History:  Procedure Laterality Date  . CESAREAN SECTION  1993  . cystoscopies     multiple  . FUNCTIONAL ENDOSCOPIC SINUS SURGERY  2003  . LAPAROSCOPIC OVARIAN CYSTECTOMY  1983  . TONSILLECTOMY  1973  . TUBAL LIGATION    . TYMPANOPLASTY W/ MASTOIDECTOMY Left 2016 & 2017    FHx:    Reviewed / unchanged  SHx:    Reviewed / unchanged   Systems Review:  Constitutional: Denies fever, chills, wt changes, headaches, insomnia, fatigue, night sweats, change in appetite. Eyes: Denies redness, blurred vision, diplopia, discharge, itchy, watery eyes.  ENT: Denies discharge, congestion, post nasal drip, epistaxis, sore throat, earache, hearing loss, dental pain, tinnitus, vertigo, sinus pain, snoring.  CV: Denies chest pain, palpitations, irregular heartbeat, syncope, dyspnea, diaphoresis, orthopnea, PND, claudication or edema. Respiratory: denies cough, dyspnea, DOE, pleurisy, hoarseness, laryngitis, wheezing.  Gastrointestinal: Denies dysphagia, odynophagia, heartburn, reflux, water brash, abdominal pain or cramps, nausea, vomiting, bloating, diarrhea, constipation, hematemesis, melena, hematochezia  or hemorrhoids. Genitourinary: Denies dysuria, frequency, urgency, nocturia, hesitancy, discharge, hematuria or flank pain. Musculoskeletal: Denies arthralgias, myalgias, stiffness, jt. swelling, pain, limping or strain/sprain.  Skin: Denies pruritus, rash, hives, warts, acne, eczema or change in skin lesion(s). Neuro: No weakness, tremor, incoordination, spasms, paresthesia or pain. Psychiatric: Denies confusion, memory loss or sensory loss. Endo: Denies change in weight, skin or hair change.  Heme/Lymph: No excessive bleeding, bruising or enlarged lymph nodes.  Physical Exam  BP 102/76   Pulse 88   Temp 97.8 F (36.6 C)   Resp 16   Ht 5\' 2"  (1.575 m)   Wt 178 lb 3.2 oz (80.8 kg)   SpO2 97%   BMI 32.59 kg/m   Appears  over nourished,  well groomed  and in no distress.  Eyes: PERRLA, EOMs, conjunctiva no swelling or erythema. Sinuses: No frontal, but tender maxillary tenderness ENT/Mouth: EAC's clear, TM's nl w/o erythema, bulging. Nares clear w/o erythema, swelling, exudates. Oropharynx clear without erythema or exudates. Oral hygiene is good. Tongue normal, non obstructing. Hearing intact.  Neck: Supple. Thyroid not palpable. Car 2+/2+ without bruits, nodes or JVD. Chest: Respirations nl with BS clear & equal w/ few scattered  Rales, no rhonchi, wheezing or stridor.  Cor: Heart sounds normal w/ regular rate and rhythm without sig. murmurs, gallops, clicks or  rubs. Peripheral pulses normal and equal  without edema.  Abdomen: Soft & bowel sounds normal. Non-tender w/o guarding, rebound, hernias, masses or organomegaly.  Lymphatics: Unremarkable.  Musculoskeletal: Full ROM all peripheral extremities, joint stability, 5/5 strength and normal gait.  Skin: Warm, dry without exposed rashes, lesions or ecchymosis apparent.  Neuro: Cranial nerves intact, reflexes equal bilaterally. Sensory-motor testing grossly intact. Tendon reflexes grossly intact.  Pysch: Alert & oriented x 3.  Insight and judgement nl & appropriate. No ideations.  Assessment and Plan:  1. Labile hypertension  - Continue medication, monitor blood pressure at home.  - Continue DASH diet.  Reminder to go to the ER if any CP,  SOB, nausea, dizziness, severe HA, changes vision/speech.  - CBC with Differential/Platelet - COMPLETE METABOLIC PANEL WITH GFR - Magnesium - TSH  2. Hyperlipidemia associated with type 2 diabetes mellitus (HCC)  - Continue diet/meds, exercise,& lifestyle modifications.  - Continue monitor periodic cholesterol/liver & renal functions   - Lipid panel - TSH  3. Type 2 diabetes mellitus with diabetic neuropathy,  without long-term current use of insulin (HCC)  - Continue diet, exercise  - Lifestyle modifications.  - Monitor  appropriate labs.  - Hemoglobin A1c - Insulin, random  4. Right-sided chest pain  - Sedimentation rate - C-reactive protein  5. Hypothyroidism  - VITAMIN D 25 Hydroxy   6. Vitamin D deficiency  - Continue supplementation.  - VITAMIN D 25 Hydroxy   7. Tracheitis  - azithromycin  250 MG tablet; Take 2 tablets with Food on  Day 1, then 1 tablet Daily with Food for Infection  Dispense: 6 each; Refill: 1  - dexamethasone 1 MG tablet; Take 1 tab 3 x day - 3 days, then 2 x day - 3 days, then 1 tab daily  Dispense: 20 tablet; Refill: 0  - promethazine-codeine (PHENERGAN WITH CODEINE) 6.25-10 MG/5ML syrup; Take 1 or 2 teaspoonful every 4 hours as needed for Cough / Congestion  Dispense: 360 mL; Refill: 1  8. Medication management  - CBC with Differential/Platelet - COMPLETE METABOLIC PANEL WITH GFR - Magnesium - Lipid panel - TSH - Hemoglobin A1c - Insulin, random - VITAMIN D 25 Hydroxy (Vit-D Deficiency, Fractures)  9. Acute maxillary sinusitis, recurrence not specified  - azithromycin  250 MG tablet; Take 2 tablets with Food on  Day 1, then 1 tablet Daily with Food for Infection  Dispense: 6 each; Refill: 1  - dexamethasone (DECADRON) 1 MG tablet; Take 1 tab 3 x day - 3 days, then 2 x day - 3 days, then 1 tab daily  Dispense: 20 tablet; Refill: 0        Discussed  regular exercise, BP monitoring, weight control to achieve/maintain BMI less than 25 and discussed med and SE's. Recommended labs to assess and monitor clinical status with further disposition pending results of labs.  I discussed the assessment and treatment plan with the patient. The patient was provided an opportunity to ask questions and all were answered. The patient agreed with the plan and demonstrated an understanding of the instructions.  I provided over 30 minutes of exam, counseling, chart review and  complex critical decision making.       The patient was advised to call back or seek an in-person  evaluation if the symptoms worsen or if the condition fails to improve as anticipated.   Marinus Maw, MD

## 2020-06-27 NOTE — Patient Instructions (Signed)

## 2020-06-28 LAB — CBC WITH DIFFERENTIAL/PLATELET
Absolute Monocytes: 672 cells/uL (ref 200–950)
Basophils Absolute: 71 cells/uL (ref 0–200)
Basophils Relative: 0.9 %
Eosinophils Absolute: 213 cells/uL (ref 15–500)
Eosinophils Relative: 2.7 %
HCT: 41.5 % (ref 35.0–45.0)
Hemoglobin: 14.2 g/dL (ref 11.7–15.5)
Lymphs Abs: 2686 cells/uL (ref 850–3900)
MCH: 30 pg (ref 27.0–33.0)
MCHC: 34.2 g/dL (ref 32.0–36.0)
MCV: 87.6 fL (ref 80.0–100.0)
MPV: 10.3 fL (ref 7.5–12.5)
Monocytes Relative: 8.5 %
Neutro Abs: 4258 cells/uL (ref 1500–7800)
Neutrophils Relative %: 53.9 %
Platelets: 369 10*3/uL (ref 140–400)
RBC: 4.74 10*6/uL (ref 3.80–5.10)
RDW: 13 % (ref 11.0–15.0)
Total Lymphocyte: 34 %
WBC: 7.9 10*3/uL (ref 3.8–10.8)

## 2020-06-28 LAB — INSULIN, RANDOM: Insulin: 40.7 u[IU]/mL — ABNORMAL HIGH

## 2020-06-28 LAB — MAGNESIUM: Magnesium: 2.2 mg/dL (ref 1.5–2.5)

## 2020-06-28 LAB — COMPLETE METABOLIC PANEL WITH GFR
AG Ratio: 2.3 (calc) (ref 1.0–2.5)
ALT: 27 U/L (ref 6–29)
AST: 26 U/L (ref 10–35)
Albumin: 4.5 g/dL (ref 3.6–5.1)
Alkaline phosphatase (APISO): 101 U/L (ref 37–153)
BUN: 19 mg/dL (ref 7–25)
CO2: 26 mmol/L (ref 20–32)
Calcium: 10.6 mg/dL — ABNORMAL HIGH (ref 8.6–10.4)
Chloride: 102 mmol/L (ref 98–110)
Creat: 0.57 mg/dL (ref 0.50–1.05)
GFR, Est African American: 123 mL/min/{1.73_m2} (ref >=60)
GFR, Est Non African American: 106 mL/min/{1.73_m2} (ref >=60)
Globulin: 2 g/dL (calc) (ref 1.9–3.7)
Glucose, Bld: 162 mg/dL — ABNORMAL HIGH (ref 65–99)
Potassium: 4.4 mmol/L (ref 3.5–5.3)
Sodium: 139 mmol/L (ref 135–146)
Total Bilirubin: 0.4 mg/dL (ref 0.2–1.2)
Total Protein: 6.5 g/dL (ref 6.1–8.1)

## 2020-06-28 LAB — LIPID PANEL
Cholesterol: 112 mg/dL (ref <200)
HDL: 41 mg/dL — ABNORMAL LOW (ref >=50)
LDL Cholesterol (Calc): 39 mg/dL (calc)
Non-HDL Cholesterol (Calc): 71 mg/dL (calc) (ref <130)
Total CHOL/HDL Ratio: 2.7 (calc) (ref <5.0)
Triglycerides: 308 mg/dL — ABNORMAL HIGH (ref <150)

## 2020-06-28 LAB — HEMOGLOBIN A1C
Hgb A1c MFr Bld: 7.6 % of total Hgb — ABNORMAL HIGH (ref <5.7)
Mean Plasma Glucose: 171 (calc)
eAG (mmol/L): 9.5 (calc)

## 2020-06-28 LAB — TSH: TSH: 1.34 mIU/L

## 2020-06-28 LAB — C-REACTIVE PROTEIN: CRP: 22.7 mg/L — ABNORMAL HIGH (ref <8.0)

## 2020-06-28 LAB — SEDIMENTATION RATE: Sed Rate: 2 mm/h (ref 0–30)

## 2020-06-28 LAB — VITAMIN D 25 HYDROXY (VIT D DEFICIENCY, FRACTURES): Vit D, 25-Hydroxy: 48 ng/mL (ref 30–100)

## 2020-06-28 NOTE — Progress Notes (Signed)
============================================================= -   Test results slightly outside the reference range are not unusual. If there is anything important, I will review this with you,  otherwise it is considered normal test values.  If you have further questions,  please do not hesitate to contact me at the office or via My Chart.  =============================================================  -  CRP (measure of inflammation) is elevated and likely due to current infection  -  Sed rate is very low and Normal  -  Suggest repeat  the CRP sometime in the  future when  you don't have an infection ==========================================================  -  CBC  / WBC -Normal-OK ==========================================================  -  Total Chol = 112 and LDL Chol = 39 - Both  Excellent   - Very low risk for Heart Attack  / Stroke =============================================================  - But Triglycerides (   308   ) or fats in blood are too high  (goal is less than 150)    - Recommend avoid fried & greasy foods,  sweets / candy,   - Avoid white rice  (brown or wild rice or Quinoa is OK),   - Avoid white potatoes  (sweet potatoes are OK)   - Avoid anything made from white flour  - bagels, doughnuts, rolls, buns, biscuits, white and   wheat breads, pizza crust and traditional  pasta made of white flour & egg white  - (vegetarian pasta or spinach or wheat pasta is OK).    - Multi-grain bread is OK - like multi-grain flat bread or  sandwich thins.   - Avoid alcohol in excess.   - Exercise is also important. ==========================================================  -  A1c = 7.% -too high --Goal is less than 6.0% and Ideal is less than 5.7% ==========================================================  -  Vitamin D = 48 -Low   - Vitamin D goal is between 70-100.   - Please make sure that you are taking your Vitamin D 10,000 units /day !  - It is  very important as a natural anti-inflammatory and helping the  immune system protect against viral infections, like the Covid-19    helping hair, skin, and nails, as well as reducing stroke and  heart attack risk.   - It helps your bones and helps with mood.  - It also decreases numerous cancer risks so please  take it as directed.   - Low Vit D is associated with a 200-300% higher risk for  CANCER   and 200-300% higher risk for HEART   ATTACK  &  STROKE.    - It is also associated with higher death rate at younger ages,   autoimmune diseases like Rheumatoid arthritis, Lupus,  Multiple Sclerosis.     - Also many other serious conditions, like depression, Alzheimer's  Dementia, infertility, muscle aches, fatigue, fibromyalgia   - just to name a few. ==============================================================  - .All Else - CBC - Kidneys - Electrolytes - Liver - Magnesium & Thyroid    - all  Normal / OK ==============================================================

## 2020-07-11 ENCOUNTER — Ambulatory Visit: Payer: Managed Care, Other (non HMO) | Admitting: Pulmonary Disease

## 2020-07-11 ENCOUNTER — Other Ambulatory Visit: Payer: Self-pay

## 2020-07-11 ENCOUNTER — Encounter: Payer: Self-pay | Admitting: Pulmonary Disease

## 2020-07-11 VITALS — BP 102/68 | HR 89 | Temp 97.2°F | Ht 62.0 in | Wt 178.0 lb

## 2020-07-11 DIAGNOSIS — R0602 Shortness of breath: Secondary | ICD-10-CM | POA: Diagnosis not present

## 2020-07-11 DIAGNOSIS — U099 Post covid-19 condition, unspecified: Secondary | ICD-10-CM | POA: Diagnosis not present

## 2020-07-11 DIAGNOSIS — R0600 Dyspnea, unspecified: Secondary | ICD-10-CM | POA: Diagnosis not present

## 2020-07-11 NOTE — Progress Notes (Signed)
Caitlin Barnett    [id number]    October 17, 1965  Primary Care Physician:McKeown, Gwyndolyn Saxon, MD  Referring Physician: Liane Comber, NP [address] [place],  [place] [address]  Chief complaint: Consult for post COVID-99  HPI: 54 year old with diabetes, hyperlipidemia, hypothyroidism, asthma admitted with COVID-19 in December 2020.  Treated with remdesivir, steroids.  She did not require intubation Discharged on prednisone taper for about 4 weeks and 6 L oxygen.  She has weaned herself off oxygen  Continues to have persistent dyspnea on exertion.  Denies any cough, sputum production, fevers, chills  Post discharge she had GI symptoms including nausea, vomiting, dyspepsia with intermittent dysphagia.  She has history of chronic GERD.  Underwent colonoscopy and upper GI by Dr. Loletha Carrow, GI  History notable for hospitalization for left lower lobe pneumonia 2019.  Hospital course complicated by CHF and palpitations.  She had follow-up with cardiology and pulmonary with follow-up CT and PFTs which normalized and echocardiogram which showed normal cardiac function.  She has history of asthma but was never on inhalers.  Pets: Dog Occupation: Equities trader.  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  No mold, hot tub, Jacuzzi.  No feather pillows or comforters Smoking history: 15-pack-year smoker.  Quit in 2019 Travel history: No significant travel history Relevant family history: Mom had COPD.  She was a smoker.  Outpatient Encounter Medications as of 07/11/2020  Medication Sig  . albuterol (PROVENTIL HFA;VENTOLIN HFA) 108 (90 Base) MCG/ACT inhaler 2 inhalations  15-20  minutes apart every 4 hours as needed to rescue Asthma (Patient taking differently: Inhale 2 puffs into the lungs every 4 (four) hours as needed (asthma). Inhale 15-20  minutes apart)  . albuterol (PROVENTIL) (2.5 MG/3ML) 0.083% nebulizer solution  Take 3 mLs (2.5 mg total) by nebulization every 6 (six) hours as needed for wheezing or shortness of breath.  Marland Kitchen aspirin EC 81 MG tablet Take 81 mg by mouth daily.  . blood glucose meter kit and supplies KIT Dispense based on patient and insurance preference. Use up to four times daily as directed. (FOR ICD-9 250.00, 250.01).  . butalbital-acetaminophen-caffeine (FIORICET) 50-325-40 MG tablet Take 0.5 tablets by mouth every 4 (four) hours as needed for headache.  . cetirizine (ZYRTEC) 10 MG tablet Take 10 mg by mouth daily.  . Cholecalciferol 1000 units capsule Take 5,000 Units by mouth 2 (two) times daily.   . Cinnamon 500 MG TABS Take 2 capsules by mouth 2 (two) times daily.  . cyanocobalamin (,VITAMIN B-12,) 1000 MCG/ML injection INJECT 1 ML (1,000 MCG TOTAL) INTO THE MUSCLE EVERY 30 (THIRTY) DAYS.  Marland Kitchen cyclobenzaprine (FLEXERIL) 5 MG tablet Take 1 tablet (5 mg total) by mouth 3 (three) times daily as needed for muscle spasms.  Marland Kitchen estrogen, conjugated,-medroxyprogesterone (PREMPRO) 0.45-1.5 MG tablet TAKE 1 TABLET BY MOUTH EVERY DAY  . fenofibrate (TRICOR) 145 MG tablet TAKE 1 TABLET DAILY FOR TRIGLYCERIDES (BLOOD FATS)  . furosemide (LASIX) 40 MG tablet Take 0.5-1 tablets (20-40 mg total) by mouth daily as needed. NEED OV.  Marland Kitchen glucose blood test strip Test blood sugar once daily  . icosapent Ethyl (VASCEPA) 1 g capsule Take 1 capsule (1 g total) by mouth 2 (two) times daily.  Marland Kitchen JARDIANCE 25 MG TABS tablet TAKE 1 TABLET BY MOUTH DAILY FOR DIABETES  . levothyroxine (SYNTHROID) 125 MCG tablet TAKE 1 TAB DAILY ON AN EMPTY STOMACH WITH ONLY WATER FOR  30 MINUTES & NO ANTACID MEDS, CALCIUM OR MAGNESIUM FOR 4 HOURS & AVOID BIOTIN  . Magnesium 250 MG TABS Take 250 mg by mouth daily.  . meloxicam (MOBIC) 15 MG tablet Take    1/2 to 1 tablet      Daily       with Food for Pain & Inflammation  . metFORMIN (GLUCOPHAGE-XR) 500 MG 24 hr tablet Take 2 tablets   2 x /day   with Meals for Diabetes  . montelukast  (SINGULAIR) 10 MG tablet TAKE 1 TABLET BY MOUTH DAILY FOR ALLERGIES  . omeprazole (PRILOSEC) 40 MG capsule TAKE 1 CAPSULE BY MOUTH EVERY DAY  . ondansetron (ZOFRAN) 4 MG tablet Take 1 tablet (4 mg total) by mouth every 6 (six) hours as needed for nausea.  . potassium chloride (KLOR-CON) 10 MEQ tablet TAKE 1 TABLET BY MOUTH EVERY DAY  . pravastatin (PRAVACHOL) 20 MG tablet Take 1 tablet (20 mg total) by mouth at bedtime.  . pregabalin (LYRICA) 75 MG capsule Take 1-2 capsule 3 x /day for Neuropathy Pains  . promethazine-codeine (PHENERGAN WITH CODEINE) 6.25-10 MG/5ML syrup Take 1 or 2 teaspoonful every 4 hours as needed for Cough / Congestion  . traZODone (DESYREL) 150 MG tablet Take 1/2 to 1 tablet 1 hour before Bedtime if needed for Sleep (G47.00)  . valACYclovir (VALTREX) 1000 MG tablet Take 1 tablet 3 x /day for Fever Blisters / Cold Sores (Patient taking differently: daily as needed. Take 1 tablet 3 x /day for Fever Blisters / Cold Sores)  . [DISCONTINUED] azithromycin (ZITHROMAX) 250 MG tablet Take 2 tablets with Food on  Day 1, then 1 tablet Daily with Food for Infection  . [DISCONTINUED] dexamethasone (DECADRON) 1 MG tablet Take 1 tab 3 x day - 3 days, then 2 x day - 3 days, then 1 tab daily   No facility-administered encounter medications on file as of 07/11/2020.    Allergies as of 07/11/2020 - Review Complete 07/11/2020  Allergen Reaction Noted  . Tetanus toxoids Other (See Comments) 07/22/2017  . Sulfa antibiotics Hives 11/07/2016    Past Medical History:  Diagnosis Date  . COVID-19   . Diabetes mellitus without complication (HCC)   . Diverticulitis   . GERD (gastroesophageal reflux disease)   . Hyperlipidemia   . IBS (irritable bowel syndrome)   . Insomnia 12/10/2019  . Migraine   . Pneumonia 11/2017  . Thyroid disease   . UTI (urinary tract infection)     Past Surgical History:  Procedure Laterality Date  . CESAREAN SECTION  1993  . cystoscopies     multiple  .  FUNCTIONAL ENDOSCOPIC SINUS SURGERY  2003  . LAPAROSCOPIC OVARIAN CYSTECTOMY  1983  . TONSILLECTOMY  1973  . TUBAL LIGATION    . TYMPANOPLASTY W/ MASTOIDECTOMY Left 2016 & 2017    Family History  Problem Relation Age of Onset  . Suicidality Mother   . Depression Mother   . Alcohol abuse Father   . Kidney disease Father   . Suicidality Father   . Depression Father   . Breast cancer Maternal Aunt   . Heart disease Maternal Grandmother   . Breast cancer Maternal Grandmother   . Suicidality Maternal Grandfather   . Stroke Paternal Grandmother   . Heart disease Paternal Grandmother   . Heart disease Paternal Grandfather   . Esophageal cancer Maternal Uncle   . Colon cancer Neg Hx   . Pancreatic cancer Neg Hx   . Stomach cancer  Neg Hx   . Liver disease Neg Hx     Social History   Socioeconomic History  . Marital status: Married    Spouse name: Not on file  . Number of children: Not on file  . Years of education: Not on file  . Highest education level: Not on file  Occupational History  . Not on file  Tobacco Use  . Smoking status: Former Smoker    Packs/day: 0.50    Years: 30.00    Pack years: 15.00    Types: Cigarettes    Quit date: 08/24/2017    Years since quitting: 2.8  . Smokeless tobacco: Never Used  Vaping Use  . Vaping Use: Never used  Substance and Sexual Activity  . Alcohol use: No  . Drug use: No  . Sexual activity: Not on file  Other Topics Concern  . Not on file  Social History Narrative  . Not on file   Social Determinants of Health   Financial Resource Strain:   . Difficulty of Paying Living Expenses: Not on file  Food Insecurity:   . Worried About Programme researcher, broadcasting/film/video in the Last Year: Not on file  . Ran Out of Food in the Last Year: Not on file  Transportation Needs:   . Lack of Transportation (Medical): Not on file  . Lack of Transportation (Non-Medical): Not on file  Physical Activity:   . Days of Exercise per Week: Not on file  .  Minutes of Exercise per Session: Not on file  Stress:   . Feeling of Stress : Not on file  Social Connections:   . Frequency of Communication with Friends and Family: Not on file  . Frequency of Social Gatherings with Friends and Family: Not on file  . Attends Religious Services: Not on file  . Active Member of Clubs or Organizations: Not on file  . Attends Banker Meetings: Not on file  . Marital Status: Not on file  Intimate Partner Violence:   . Fear of Current or Ex-Partner: Not on file  . Emotionally Abused: Not on file  . Physically Abused: Not on file  . Sexually Abused: Not on file    Review of systems: Review of Systems  Constitutional: Negative for fever and chills.  HENT: Negative.   Eyes: Negative for blurred vision.  Respiratory: as per HPI  Cardiovascular: Negative for chest pain and palpitations.  Gastrointestinal: Negative for vomiting, diarrhea, blood per rectum. Genitourinary: Negative for dysuria, urgency, frequency and hematuria.  Musculoskeletal: Negative for myalgias, back pain and joint pain.  Skin: Negative for itching and rash.  Neurological: Negative for dizziness, tremors, focal weakness, seizures and loss of consciousness.  Endo/Heme/Allergies: Negative for environmental allergies.  Psychiatric/Behavioral: Negative for depression, suicidal ideas and hallucinations.  All other systems reviewed and are negative.  Physical Exam: Blood pressure 102/68, pulse 89, temperature (!) 97.2 F (36.2 C), temperature source Temporal, height 5\' 2"  (1.575 m), weight 178 lb (80.7 kg), SpO2 98 %. Gen:      No acute distress HEENT:  EOMI, sclera anicteric Neck:     No masses; no thyromegaly Lungs:    Clear to auscultation bilaterally; normal respiratory effort CV:         Regular rate and rhythm; no murmurs Abd:      + bowel sounds; soft, non-tender; no palpable masses, no distension Ext:    No edema; adequate peripheral perfusion Skin:      Warm and  dry; no  rash Neuro: alert and oriented x 3 Psych: normal mood and affect  Data Reviewed: Imaging: CTA 07/25/2019-no PE, peripheral predominant, bilateral eyes groundglass opacities from home. Chest x-ray 08/18/2019-no acute cardiopulmonary disease Chest x-ray 05/31/2020-no acute cardiopulmonary disease I have reviewed the images personally  PFTs: 03/03/2018 FVC 2.90 [90%], FEV1 2.43 [95%], F/F 84, TLC 4.61 [98%], DLCO 19.19 [91%] Normal test  Labs:  Assessment:  Post COVID-19 Follow-up chest x-ray after hospitalization looks clear however she has persistent symptoms of dyspnea on exertion Suspect deconditioning is a major component to presentation Referred to pulmonary rehab  Schedule high-resolution CT and PFTs for further evaluation of the lungs  Plan/Recommendations: Referral to pulmonary rehab High-res CT, PFTs  Marshell Garfinkel MD [facility] 07/11/2020, 8:32 AM  CC: Liane Comber, NP

## 2020-07-11 NOTE — Addendum Note (Signed)
Addended by: Melonie [place] on: 07/11/2020 09:09 AM   Modules accepted: Orders

## 2020-07-11 NOTE — Patient Instructions (Addendum)
We will refer you to pulmonary rehab at [facility] for post Covid syndrome Schedule high-resolution CT and PFTs for better evaluation of your lungs  Follow-up in 2 to 3 months.

## 2020-07-14 ENCOUNTER — Telehealth (HOSPITAL_COMMUNITY): Payer: Self-pay

## 2020-07-14 NOTE — Telephone Encounter (Signed)
Pt insurance is active and benefits verified through [place] $30, DED $500/$500 met, out of pocket $2,500/$2,500 met, co-insurance 0%. no pre-authorization required. Passport: [place]/[employer] 07/14/2020_0 :27pm   Ref: [id number]

## 2020-07-18 LAB — HM DIABETES EYE EXAM

## 2020-07-21 ENCOUNTER — Ambulatory Visit (INDEPENDENT_AMBULATORY_CARE_PROVIDER_SITE_OTHER): Payer: Managed Care, Other (non HMO) | Admitting: Primary Care

## 2020-07-21 ENCOUNTER — Encounter: Payer: Self-pay | Admitting: Primary Care

## 2020-07-21 ENCOUNTER — Other Ambulatory Visit: Payer: Self-pay

## 2020-07-21 DIAGNOSIS — R0600 Dyspnea, unspecified: Secondary | ICD-10-CM

## 2020-07-21 DIAGNOSIS — R0609 Other forms of dyspnea: Secondary | ICD-10-CM

## 2020-07-21 NOTE — Progress Notes (Signed)
Peer-to-peer. CT chest was order d/t shortness of breath. This was denied d/t no record of PFTs or evaluation of heart. Patient had Covid-19 in December 2020, requiring hospitalization. She has had persistent shortness of breath. Orderd for HRCT to evaluate for post covid fibrosis. She had PFTs in 2019 that were normal. She also had an echocardiogram in 2019 that was normal.   03/03/2018 FVC 2.90 [90%], FEV1 2.43 [95%], F/F 84, TLC 4.61 [98%], DLCO 19.19 [91%] Normal test  Auth code- [id number]

## 2020-07-21 NOTE — Patient Instructions (Signed)
Peer to peer

## 2020-07-27 ENCOUNTER — Other Ambulatory Visit: Payer: Self-pay

## 2020-07-27 ENCOUNTER — Encounter: Payer: Self-pay | Admitting: Internal Medicine

## 2020-07-27 ENCOUNTER — Other Ambulatory Visit: Payer: Managed Care, Other (non HMO)

## 2020-07-27 ENCOUNTER — Ambulatory Visit (INDEPENDENT_AMBULATORY_CARE_PROVIDER_SITE_OTHER): Payer: Managed Care, Other (non HMO) | Admitting: Internal Medicine

## 2020-07-27 VITALS — BP 132/70 | HR 109 | Temp 97.9°F | Ht 62.0 in | Wt 174.0 lb

## 2020-07-27 DIAGNOSIS — M064 Inflammatory polyarthropathy: Secondary | ICD-10-CM

## 2020-07-27 DIAGNOSIS — R509 Fever, unspecified: Secondary | ICD-10-CM | POA: Diagnosis not present

## 2020-07-27 DIAGNOSIS — M353 Polymyalgia rheumatica: Secondary | ICD-10-CM

## 2020-07-27 DIAGNOSIS — M2550 Pain in unspecified joint: Secondary | ICD-10-CM | POA: Diagnosis not present

## 2020-07-27 DIAGNOSIS — U099 Post covid-19 condition, unspecified: Secondary | ICD-10-CM

## 2020-07-27 DIAGNOSIS — G8929 Other chronic pain: Secondary | ICD-10-CM

## 2020-07-27 DIAGNOSIS — M791 Myalgia, unspecified site: Secondary | ICD-10-CM

## 2020-07-27 MED ORDER — TRAMADOL HCL 50 MG PO TABS: ORAL_TABLET | ORAL | 0 refills | Status: DC

## 2020-07-27 MED ORDER — DEXAMETHASONE 4 MG PO TABS: ORAL_TABLET | ORAL | 0 refills | Status: DC

## 2020-07-27 NOTE — Progress Notes (Signed)
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 She did reuire insulin coverage of steroid induced Hyperglycemia.        Patient presents presents with a week prodrome of  fever, chills, generalized body & joint aches, myalgias, skin sensitivity & profound weakness. She quantifies her pain peaking at 8/10. She describes several similar episodes since her Covid infection                                              Medications  .  PREMPRO  0.45-1.5 MG tablet, TAKE 1 TABLET  EVERY DAY .  JARDIANCE 25 MG TABS tablet, TAKE 1 TABLET  DAILY  .  levothyroxine 125 MCG tablet, TAKE 1 TAB DAILY  .  metFORMIN-XR 500 MG 2, Take 2 tablets   2 x /day  .  fenofibrate  145 MG tablet, TAKE 1 TABLET DAILY  .  furosemide 40 MG tablet, Take 0.5-1 tablets  daily as needed. Marland Kitchen  VASCEPA 1 g , Take 1 capsule 2  times daily. .  pravastatin  20 MG tablet, Take 1 tablet at bedtime. Marland Kitchen  albuterol HFA  inhaler, 2 inhalations  15-20  minutes apart every 4 hours as needed .  albuterol (2.5 MG/3ML neb soln, Take 3 mLs by neb every 6 hours as needed  .  cetirizine  10 MG t, Take  daily. .  montelukast  10 MG  TAKE 1 TABLET DAILY  .  aspirin EC 81 MG , Take  daily. Marland Kitchen  FIORICET , Take 0.5 tablets  every 4  hours as needed  .  meloxicam  15 MG tablet, Take  1/2 to 1 tablet Daily  .  VITAMIN B-12 1000 MCG/ML , INJECT 1 ML IM .  Cholecalciferol 1000 units capsule, Take 5,000 Units  2 (two) times daily.  .  Cinnamon 500 MG TABS, Take 2 capsules 2  times daily. .  cyclobenzaprine (FLEXERIL) 5 MG tablet, Take 1 tablet 3  times daily as needed  .  Magnesium 250 MG TABS, Take  daily. .  Omeprazole 40 MG capsule, TAKE 1 CAPSULE  EVERY DAY .  Ondansetron  4 MG  tablet, Take 1 tablet every 6  hours as needed .  potassium chloride  10 MEQ , TAKE 1 TABLET  EVERY DAY .  pregabalin  75 MG capsule, Take 1-2 capsule 3 x /day for Neuropathy Pains .  traZODone  150 MG tablet, Take 1/2 to 1 tablet 1 hour before Bedtime if needed for Sleep (G47.00) .  valACYclovir  1000 MG tablet, Take 1 tablet 3 x /day for Fever Blisters / Cold Sores    Problem list  She has Hypothyroidism; Type 2 diabetes mellitus (HCC); Vitamin D deficiency; Postmenopausal; Hot flashes due to menopause; Vitamin B12 deficiency; Peripheral neuropathy; Hyperlipidemia associated with type 2 diabetes mellitus (HCC); GERD (gastroesophageal reflux disease); Obesity (BMI 30.0-34.9); Bilateral lower extremity edema; Chronic diastolic (congestive) heart failure (HCC); Hepatic steatosis; Chronic suppurative otitis media of left ear; Fatigue; Former smoker; FHx: heart disease; Class 1 obesity due to  excess calories with serious comorbidity and body mass index (BMI) of 31.0 to 31.9 in adult; Hypertension; Insomnia; and Dyspnea on minimal exertion on their problem list.    Observations/Objective:   BP 132/70   Pulse (!) 109   Temp 97.9 F (36.6 C)   Ht 5\' 2"  (1.575 m)   Wt 174 lb (78.9 kg)   SpO2 98%   BMI 31.83 kg/m   HEENT - WNL. Neck - supple.  Chest - Clear equal BS. Cor - Nl HS. RRR w/o sig MGR. PP 1(+). No edema. MS- FROM w/o deformities.  Gait Nl. Neuro -  Nl w/o focal abnormalities. Skin - Clear w/o rash, cyanosis or icterus.   Assessment and Plan:  1. Fever, unspecified fever cause  - CBC with Differential/Platelet - COMPLETE METABOLIC PANEL WITH GFR - Sedimentation rate - C3 and C4 - Cyclic citrul peptide antibody, IgG - C-reactive protein - CK - Aldolase - B. burgdorfi antibodies - Anti-Smith antibody - dexamethasone  4 MG tablet; Take 1 tab 3 x day - 3 days, then 2 x day - 3 days, then 1 tab daily  Dispense: 20 tablet; Refill: 0  2. Polyarthralgia  - CBC with  Differential/Platelet - Sedimentation rate - C3 and C4 - Cyclic citrul peptide antibody, IgG - C-reactive protein - B. burgdorfi antibodies - Anti-Smith antibody  - dexamethasone  4 MG tablet; Take 1 tab 3 x day - 3 days, then 2 x day - 3 days, then 1 tab daily  Dispense: 20 tab  - traMADol  50 MG tablet; Take     1 to 2 tablets      every 4 hours if needed    for Severe Pain  Dispense: 30 tablet; Refill: 0  3. Polymyalgia (HCC)  - CK - Aldolase - B. burgdorfi antibodies - Anti-Smith antibody - dexamethasone (DECADRON) 4 MG tablet; Take 1 tab 3 x day - 3 days, then 2 x day - 3 days, then 1 tab daily  Dispense: 20 tablet; Refill: 0 - traMADol (ULTRAM) 50 MG tablet; Take     1 to 2 tablets      every 4 hours if needed    for Severe Pain  Dispense: 30 tablet; Refill: 0  4. Post-COVID chronic muscle pain  - Sedimentation rate  5. Inflammatory polyarthritis (HCC)  - Sedimentation rate   Follow Up Instructions:      I discussed the assessment and treatment plan with the patient & husband. They were provided an opportunity to ask questions and all were answered. They agreed with the plan and demonstrated an understanding of the instructions.  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  * * * * * * * * * * * * * * * * * * * * * * * * * * * * * * * * * * * * * * * * * * * * * * * *    * * * * * * * * * * * * * * * * * * * * * * * * * * * * * * * * * * * * * * * * * * * * * * * *  Kirtland Bouchard, MD

## 2020-07-28 LAB — CBC WITH DIFFERENTIAL/PLATELET
Absolute Monocytes: 685 cells/uL (ref 200–950)
Basophils Absolute: 23 cells/uL (ref 0–200)
Basophils Relative: 0.3 %
Eosinophils Absolute: 0 cells/uL — ABNORMAL LOW (ref 15–500)
Eosinophils Relative: 0 %
HCT: 42.5 % (ref 35.0–45.0)
Hemoglobin: 14.6 g/dL (ref 11.7–15.5)
Lymphs Abs: 1525 cells/uL (ref 850–3900)
MCH: 30.1 pg (ref 27.0–33.0)
MCHC: 34.4 g/dL (ref 32.0–36.0)
MCV: 87.6 fL (ref 80.0–100.0)
MPV: 10.6 fL (ref 7.5–12.5)
Monocytes Relative: 8.9 %
Neutro Abs: 5467 cells/uL (ref 1500–7800)
Neutrophils Relative %: 71 %
Platelets: 317 10*3/uL (ref 140–400)
RBC: 4.85 10*6/uL (ref 3.80–5.10)
RDW: 13.2 % (ref 11.0–15.0)
Total Lymphocyte: 19.8 %
WBC: 7.7 10*3/uL (ref 3.8–10.8)

## 2020-07-28 LAB — COMPLETE METABOLIC PANEL WITH GFR
AG Ratio: 1.8 (calc) (ref 1.0–2.5)
ALT: 28 U/L (ref 6–29)
AST: 44 U/L — ABNORMAL HIGH (ref 10–35)
Albumin: 4.2 g/dL (ref 3.6–5.1)
Alkaline phosphatase (APISO): 97 U/L (ref 37–153)
BUN: 15 mg/dL (ref 7–25)
CO2: 21 mmol/L (ref 20–32)
Calcium: 9.4 mg/dL (ref 8.6–10.4)
Chloride: 102 mmol/L (ref 98–110)
Creat: 0.51 mg/dL (ref 0.50–1.05)
GFR, Est African American: 127 mL/min/{1.73_m2} (ref >=60)
GFR, Est Non African American: 110 mL/min/{1.73_m2} (ref >=60)
Globulin: 2.4 g/dL (calc) (ref 1.9–3.7)
Glucose, Bld: 89 mg/dL (ref 65–99)
Potassium: 4 mmol/L (ref 3.5–5.3)
Sodium: 137 mmol/L (ref 135–146)
Total Bilirubin: 0.5 mg/dL (ref 0.2–1.2)
Total Protein: 6.6 g/dL (ref 6.1–8.1)

## 2020-07-28 LAB — CK: Total CK: 31 U/L (ref 29–143)

## 2020-07-28 LAB — SEDIMENTATION RATE: Sed Rate: 9 mm/h (ref 0–30)

## 2020-07-30 NOTE — Progress Notes (Signed)
========================================================== -   Test results slightly outside the reference range are not unusual. If there is anything important, I will review this with you,  otherwise it is considered normal test values.  If you have further questions,  please do not hesitate to contact me at the office or via My Chart.  ========================================================== ==========================================================  -  hsCRP = 89.9 Inflammation marker - very elevated  (Normal is less than 8.0)   ========================================================== ==========================================================  - Sed rate = 9  - Normal   - CBC / WBC  - Normal   - CPK & Aldolase - Muscle enzymes - Normal   -  Lyme test - Negative  - Anti-Smith (LUPUS test ) - Negative & OK   - Anti-CCP (Rheumatoid - still pending)  but most likely will also be negative  ==========================================================  -  Constellation of symptoms in setting of recent Covid infection is suspect for   "Post infectious Poly-Arthritis"  - So Rheumatology consultation requested.   ==========================================================           -

## 2020-07-30 NOTE — Progress Notes (Signed)
========================================================== -   Test results slightly outside the reference range are not unusual. If there is anything important, I will review this with you,  otherwise it is considered normal test values.  If you have further questions,  please do not hesitate to contact me at the office or via My Chart.  ========================================================== ==========================================================  -  hsCRP = 89.9 Inflammation marker - very elevated  (Normal is less than 8.0)   ========================================================== ==========================================================  - Sed rate = 9  - Normal   - CBC / WBC  - Normal   - CPK & Aldolase - Muscle enzymes - Normal   -  Lyme test - Negative  - Anti-Smith (LUPUS test ) - Negative & OK   - Anti-CCP (Rheumatoid - still pending)  but most likely will also be negative  ==========================================================  -  Constellation of symptoms in setting of recent Covid infection is suspect for   "Post infectious Poly-Arthritis"  - So Rheumatology consultation requested.   ==========================================================           -  

## 2020-07-31 ENCOUNTER — Other Ambulatory Visit: Payer: Self-pay | Admitting: Internal Medicine

## 2020-07-31 DIAGNOSIS — M2550 Pain in unspecified joint: Secondary | ICD-10-CM

## 2020-07-31 DIAGNOSIS — R509 Fever, unspecified: Secondary | ICD-10-CM

## 2020-07-31 DIAGNOSIS — M353 Polymyalgia rheumatica: Secondary | ICD-10-CM

## 2020-07-31 LAB — C3 AND C4
C3 Complement: 198 mg/dL — ABNORMAL HIGH (ref 83–193)
C4 Complement: 26 mg/dL (ref 15–57)

## 2020-07-31 LAB — ANTI-SMITH ANTIBODY: ENA SM Ab Ser-aCnc: <1 AI

## 2020-07-31 LAB — C-REACTIVE PROTEIN: CRP: 89.9 mg/L — ABNORMAL HIGH (ref <8.0)

## 2020-07-31 LAB — B. BURGDORFI ANTIBODIES: B burgdorferi Ab IgG+IgM: <0.9 index

## 2020-07-31 LAB — CYCLIC CITRUL PEPTIDE ANTIBODY, IGG: Cyclic Citrullin Peptide Ab: <16 UNITS

## 2020-07-31 LAB — ALDOLASE: Aldolase: 5.4 U/L (ref <=8.1)

## 2020-07-31 MED ORDER — DEXAMETHASONE 4 MG PO TABS: ORAL_TABLET | ORAL | 0 refills | Status: DC

## 2020-08-14 ENCOUNTER — Other Ambulatory Visit: Payer: Self-pay

## 2020-08-14 ENCOUNTER — Ambulatory Visit (INDEPENDENT_AMBULATORY_CARE_PROVIDER_SITE_OTHER): Payer: Managed Care, Other (non HMO) | Admitting: Pulmonary Disease

## 2020-08-14 DIAGNOSIS — U099 Post covid-19 condition, unspecified: Secondary | ICD-10-CM

## 2020-08-14 DIAGNOSIS — R0600 Dyspnea, unspecified: Secondary | ICD-10-CM | POA: Diagnosis not present

## 2020-08-14 DIAGNOSIS — R0602 Shortness of breath: Secondary | ICD-10-CM

## 2020-08-14 NOTE — Progress Notes (Signed)
PFT completed today.  

## 2020-08-18 ENCOUNTER — Other Ambulatory Visit: Payer: Self-pay

## 2020-08-18 ENCOUNTER — Ambulatory Visit (INDEPENDENT_AMBULATORY_CARE_PROVIDER_SITE_OTHER)
Admission: RE | Admit: 2020-08-18 | Discharge: 2020-08-18 | Disposition: A | Discharge status: Home / Self Care | Payer: Managed Care, Other (non HMO) | Source: Ambulatory Visit | Attending: Pulmonary Disease | Admitting: Pulmonary Disease

## 2020-08-18 DIAGNOSIS — R0602 Shortness of breath: Secondary | ICD-10-CM | POA: Diagnosis not present

## 2020-08-22 ENCOUNTER — Encounter: Payer: Self-pay | Admitting: *Deleted

## 2020-08-23 ENCOUNTER — Telehealth (HOSPITAL_COMMUNITY): Payer: Self-pay

## 2020-08-23 NOTE — Telephone Encounter (Signed)
Pt insurance is active and benefits verified through [place] $30, DED $500/$216.17 met, out of pocket $2,500/$221.95 met, co-insurance 0%. no pre-authorization required. Passport, [place]/[employer] 08/23/2020@9 :00am, REF# [id number]

## 2020-08-24 ENCOUNTER — Other Ambulatory Visit: Payer: Self-pay | Admitting: Internal Medicine

## 2020-08-24 DIAGNOSIS — E1140 Type 2 diabetes mellitus with diabetic neuropathy, unspecified: Secondary | ICD-10-CM

## 2020-08-29 ENCOUNTER — Ambulatory Visit: Payer: Managed Care, Other (non HMO) | Admitting: Pulmonary Disease

## 2020-08-29 ENCOUNTER — Encounter: Payer: Self-pay | Admitting: Pulmonary Disease

## 2020-08-29 ENCOUNTER — Ambulatory Visit (INDEPENDENT_AMBULATORY_CARE_PROVIDER_SITE_OTHER): Payer: Managed Care, Other (non HMO) | Admitting: Pulmonary Disease

## 2020-08-29 ENCOUNTER — Other Ambulatory Visit: Payer: Self-pay

## 2020-08-29 ENCOUNTER — Telehealth (HOSPITAL_COMMUNITY): Payer: Self-pay | Admitting: *Deleted

## 2020-08-29 VITALS — BP 122/70 | HR 101 | Temp 99.2°F | Ht 62.0 in | Wt 177.0 lb

## 2020-08-29 DIAGNOSIS — U099 Post covid-19 condition, unspecified: Secondary | ICD-10-CM | POA: Diagnosis not present

## 2020-08-29 DIAGNOSIS — R0602 Shortness of breath: Secondary | ICD-10-CM | POA: Diagnosis not present

## 2020-08-29 LAB — PULMONARY FUNCTION TEST
DL/VA % pred: 129 %
DL/VA: 5.66 ml/min/mmHg/L
DLCO cor % pred: 110 %
DLCO cor: 21.04 ml/min/mmHg
DLCO unc % pred: 114 %
DLCO unc: 21.78 ml/min/mmHg
FEF 25-75 Post: 1.37 L/sec
FEF 25-75 Pre: 2.3 L/sec
FEF2575-%Change-Post: -40 %
FEF2575-%Pred-Post: 54 %
FEF2575-%Pred-Pre: 91 %
FEV1-%Change-Post: -18 %
FEV1-%Pred-Post: 63 %
FEV1-%Pred-Pre: 78 %
FEV1-Post: 1.59 L
FEV1-Pre: 1.96 L
FEV1FVC-%Change-Post: -19 %
FEV1FVC-%Pred-Pre: 106 %
FEV6-%Change-Post: 0 %
FEV6-%Pred-Post: 75 %
FEV6-%Pred-Pre: 75 %
FEV6-Post: 2.33 L
FEV6-Pre: 2.32 L
FEV6FVC-%Pred-Post: 103 %
FEV6FVC-%Pred-Pre: 103 %
FVC-%Change-Post: 0 %
FVC-%Pred-Post: 73 %
FVC-%Pred-Pre: 73 %
FVC-Post: 2.34 L
FVC-Pre: 2.32 L
Post FEV1/FVC ratio: 68 %
Post FEV6/FVC ratio: 100 %
Pre FEV1/FVC ratio: 84 %
Pre FEV6/FVC Ratio: 100 %
RV % pred: 106 %
RV: 1.84 L
TLC % pred: 96 %
TLC: 4.53 L

## 2020-08-29 NOTE — Patient Instructions (Signed)
I reviewed the CT scan which shows improvement in lung inflammation.  There are still some minimal inflammation which will likely be help with the prednisone I have reviewed your lung function test as well which shows mild impairment secondary to post-COVID  Continue staying active, weight loss, prednisone per rheumatology Follow-up in 6 months.

## 2020-08-29 NOTE — Progress Notes (Signed)
Caitlin Barnett    [id number]    05-Aug-1966  Primary Care Physician:McKeown, Gwyndolyn Saxon, MD  Referring Physician: Unk Pinto, MD [address] [place],  [place] [address]  Chief complaint: Follow-up for post COVID-80  HPI: 55 year old with diabetes, hyperlipidemia, hypothyroidism, asthma admitted with COVID-19 in December 2020.  Treated with remdesivir, steroids.  She did not require intubation Discharged on prednisone taper for about 4 weeks and 6 L oxygen.  She has weaned herself off oxygen  Continues to have persistent dyspnea on exertion.  Denies any cough, sputum production, fevers, chills  Post discharge she had GI symptoms including nausea, vomiting, dyspepsia with intermittent dysphagia.  She has history of chronic GERD.  Underwent colonoscopy and upper GI by Dr. Loletha Carrow, GI  History notable for hospitalization for left lower lobe pneumonia 2019.  Hospital course complicated by CHF and palpitations.  She had follow-up with cardiology and pulmonary with follow-up CT and PFTs which normalized and echocardiogram which showed normal cardiac function.  She has history of asthma but was never on inhalers.  Pets: Dog Occupation: Equities trader.  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  No mold, hot tub, Jacuzzi.  No feather pillows or comforters Smoking history: 15-pack-year smoker.  Quit in 2019 Travel history: No significant travel history Relevant family history: Mom had COPD.  She was a smoker.  Interim history: States that dyspnea is stable Complains of body pain, fatigue. She has followed up with Dr. Kathlene November, rheumatology who has done a work-up with negative serologies.  The only abnormality noted was high CRP She has been placed on prednisone 20 mg a day which is helping some with symptoms.  Outpatient Encounter Medications as of 08/29/2020  Medication Sig  . albuterol (PROVENTIL  HFA;VENTOLIN HFA) 108 (90 Base) MCG/ACT inhaler 2 inhalations  15-20  minutes apart every 4 hours as needed to rescue Asthma (Patient taking differently: Inhale 2 puffs into the lungs every 4 (four) hours as needed (asthma). Inhale 15-20  minutes apart)  . albuterol (PROVENTIL) (2.5 MG/3ML) 0.083% nebulizer solution Take 3 mLs (2.5 mg total) by nebulization every 6 (six) hours as needed for wheezing or shortness of breath.  Marland Kitchen aspirin EC 81 MG tablet Take 81 mg by mouth daily.  . blood glucose meter kit and supplies KIT Dispense based on patient and insurance preference. Use up to four times daily as directed. (FOR ICD-9 250.00, 250.01).  . butalbital-acetaminophen-caffeine (FIORICET) 50-325-40 MG tablet Take 0.5 tablets by mouth every 4 (four) hours as needed for headache.  . cetirizine (ZYRTEC) 10 MG tablet Take 10 mg by mouth daily.  . Cholecalciferol 1000 units capsule Take 5,000 Units by mouth 2 (two) times daily.   . Cinnamon 500 MG TABS Take 2 capsules by mouth 2 (two) times daily.  . cyanocobalamin (,VITAMIN B-12,) 1000 MCG/ML injection INJECT 1 ML (1,000 MCG TOTAL) INTO THE MUSCLE EVERY 30 (THIRTY) DAYS.  Marland Kitchen cyclobenzaprine (FLEXERIL) 5 MG tablet Take 1 tablet (5 mg total) by mouth 3 (three) times daily as needed for muscle spasms.  Marland Kitchen estrogen, conjugated,-medroxyprogesterone (PREMPRO) 0.45-1.5 MG tablet TAKE 1 TABLET BY MOUTH EVERY DAY  . fenofibrate (TRICOR) 145 MG tablet TAKE 1 TABLET DAILY FOR TRIGLYCERIDES (BLOOD FATS)  . furosemide (LASIX) 40 MG tablet Take 0.5-1 tablets (20-40 mg total) by mouth daily as needed. NEED OV.  Marland Kitchen glucose blood test strip Test blood sugar once daily  .  icosapent Ethyl (VASCEPA) 1 g capsule Take 1 capsule (1 g total) by mouth 2 (two) times daily.  Marland Kitchen JARDIANCE 25 MG TABS tablet TAKE 1 TABLET BY MOUTH DAILY FOR DIABETES  . levothyroxine (SYNTHROID) 125 MCG tablet TAKE 1 TAB DAILY ON AN EMPTY STOMACH WITH ONLY WATER FOR 30 MINUTES & NO ANTACID MEDS, CALCIUM OR  MAGNESIUM FOR 4 HOURS & AVOID BIOTIN  . Magnesium 250 MG TABS Take 250 mg by mouth daily.  . meloxicam (MOBIC) 15 MG tablet Take    1/2 to 1 tablet      Daily       with Food for Pain & Inflammation  . metFORMIN (GLUCOPHAGE-XR) 500 MG 24 hr tablet TAKE 2 TABLETS 2 X /DAY WITH MEALS FOR DIABETES  . montelukast (SINGULAIR) 10 MG tablet TAKE 1 TABLET BY MOUTH DAILY FOR ALLERGIES  . omeprazole (PRILOSEC) 40 MG capsule TAKE 1 CAPSULE BY MOUTH EVERY DAY  . ondansetron (ZOFRAN) 4 MG tablet Take 1 tablet (4 mg total) by mouth every 6 (six) hours as needed for nausea.  . potassium chloride (KLOR-CON) 10 MEQ tablet TAKE 1 TABLET BY MOUTH EVERY DAY  . pravastatin (PRAVACHOL) 20 MG tablet Take 1 tablet (20 mg total) by mouth at bedtime.  . pregabalin (LYRICA) 75 MG capsule Take 1-2 capsule 3 x /day for Neuropathy Pains  . traMADol (ULTRAM) 50 MG tablet Take     1 to 2 tablets      every 4 hours if needed    for Severe Pain  . traZODone (DESYREL) 150 MG tablet Take 1/2 to 1 tablet 1 hour before Bedtime if needed for Sleep (G47.00)  . valACYclovir (VALTREX) 1000 MG tablet Take 1 tablet 3 x /day for Fever Blisters / Cold Sores (Patient taking differently: daily as needed. Take 1 tablet 3 x /day for Fever Blisters / Cold Sores)  . [DISCONTINUED] dexamethasone (DECADRON) 4 MG tablet Take       1/2 to 1 tablet       2 to 3 x /day      as Directed   No facility-administered encounter medications on file as of 08/29/2020.    Physical Exam: Blood pressure 122/70, pulse (!) 101, temperature 99.2 F (37.3 C), temperature source Skin, height $RemoveBefo'5\' 2"'MQsKDlqIHQd$  (1.575 m), weight 177 lb (80.3 kg), SpO2 97 %. Gen:      No acute distress HEENT:  EOMI, sclera anicteric Neck:     No masses; no thyromegaly Lungs:    Clear to auscultation bilaterally; normal respiratory effort CV:         Regular rate and rhythm; no murmurs Abd:      + bowel sounds; soft, non-tender; no palpable masses, no distension Ext:    No edema; adequate  peripheral perfusion Skin:      Warm and dry; no rash Neuro: alert and oriented x 3 Psych: normal mood and affect  Data Reviewed: Imaging: CTA 07/25/2019-no PE, peripheral predominant, bilateral eyes groundglass opacities from home. Chest x-ray 08/18/2019-no acute cardiopulmonary disease Chest x-ray 05/31/2020-no acute cardiopulmonary disease High-resolution CT 08/18/2020- mild patchy groundglass opacities with improvement since December 2020.  Mild postinflammatory fibrosis. 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 PFTs have not been imported to epic for some reason.  Results show new minimal restriction consistent with diagnosis.  Suspect deconditioning is a major component to presentation Referred to pulmonary rehab  Continue prednisone with taper per rheumatology  Plan/Recommendations: Pred taper Exercise therapy, weight loss  Marshell Garfinkel MD [facility] 08/29/2020, 11:33 AM  CC: Unk Pinto, MD

## 2020-09-12 ENCOUNTER — Other Ambulatory Visit: Payer: Self-pay | Admitting: Adult Health Nurse Practitioner

## 2020-09-14 ENCOUNTER — Other Ambulatory Visit: Payer: Self-pay | Admitting: Internal Medicine

## 2020-09-15 ENCOUNTER — Other Ambulatory Visit: Payer: Self-pay | Admitting: Physician Assistant

## 2020-09-15 DIAGNOSIS — K219 Gastro-esophageal reflux disease without esophagitis: Secondary | ICD-10-CM

## 2020-10-17 ENCOUNTER — Other Ambulatory Visit: Payer: Self-pay | Admitting: Adult Health

## 2020-10-19 ENCOUNTER — Other Ambulatory Visit: Payer: Self-pay | Admitting: Internal Medicine

## 2020-10-19 ENCOUNTER — Other Ambulatory Visit: Payer: Self-pay | Admitting: Adult Health

## 2020-10-19 DIAGNOSIS — E1140 Type 2 diabetes mellitus with diabetic neuropathy, unspecified: Secondary | ICD-10-CM

## 2020-10-19 DIAGNOSIS — Z780 Asymptomatic menopausal state: Secondary | ICD-10-CM

## 2020-10-19 DIAGNOSIS — N951 Menopausal and female climacteric states: Secondary | ICD-10-CM

## 2020-10-28 NOTE — Progress Notes (Signed)
Annual Screening/Preventative Visit & Comprehensive Evaluation &  Examination      This very nice 55 y.o. MWF  presents for a Screening /Preventative Visit & comprehensive evaluation and management of multiple medical co-morbidities.  Patient has been followed for HTN, HLD, T2_NIDDM  and Vitamin D Deficiency. Patient's GERD is controlled with Prilosec.      Patient was hospitalized Dec 2020 with Covid 19  PNA & resp failure  (but was not ventilated). Decadron therapy exacerbated hyperglycemia requiring insulin to control & post hospital transitioned to Levemir. Post hospital she was weaned form O2 and continues f/u with Dr Isaiah Serge for c/o dyspnea. Patient had high resolution Chest CT scan in Jan 2022 showing mild post inflammatory fibrosis improved compared to Dec 2020.          In December 2021, patient presented with fever, chills, generalized body & joint aches, myalgias, skin sensitivity & profound weakness and lab screening found elevated  HsCRP and she was referred to Dr Deanne Coffer and was dx'd with post inflammatory  Seronegative Polyarthritis. He initiated tx with a DMARD (MTX) which was later d/c'd..        Patient is followed expectantly for  labile HTN. Patient's BP has been controlled w/o meds at home and patient denies any cardiac symptoms as chest pain, palpitations, shortness of breath, dizziness or ankle swelling. Today's BP  Is at goal  - 110/66.       Patient's hyperlipidemia is controlled with diet and medications. Patient denies myalgias or other medication SE's. Last lipids were at goal except elevated Trig's:  Lab Results  Component Value Date   CHOL 112 06/27/2020   HDL 41 (L) 06/27/2020   LDLCALC 39 06/27/2020   TRIG 308 (H) 06/27/2020   CHOLHDL 2.7 06/27/2020       Patient has remote hx/o  Gestational Diabetes (1993 /1996), then honeymooned until relapsing in 2016 with T2_DM & was started on Metformin.  Since post Covid hospitalization when patient was treated with  Insulin, she was transitioned back to Metformin & Insulin d/c'd.  Patient denies reactive hypoglycemic symptoms, visual blurring, diabetic polys or paresthesias. Last A1c was not at goal:  Lab Results  Component Value Date   HGBA1C 7.6 (H) 06/27/2020       Patient was dx'd Hypothyroid at age 24 yo in 56 & has been on thyroid replacement since.      Finally, patient has history of Vitamin D Deficiency ("8" /2018) and last Vitamin D was still low (goal 70-100):  Lab Results  Component Value Date   VD25OH 48 06/27/2020    Current Outpatient Medications on File Prior to Visit  Medication Sig  . albuterol  HFA inhaler 2 inhalations every 4 hours as needed to rescue Asthma   . albuterol  (2.5 MG/3ML) 0.083% neb soln Take 3 mLs by neb every 6 (six) hours as needed   . aspirin EC 81 MG tablet Take 8 daily.  Marland Kitchen FIORICET Take 0.5 tablets every 4  hours as needed for headache.  . cetirizine  10 MG tablet Take  daily.  . Cholecalciferol  5,000 Units  Take  2  times daily.   . Cinnamon 500 MG TABS Take 2 capsules  2  times daily.  Marland Kitchen VITAMIN B-12 SL Place 1 tablet under the tongue daily.  . cyclobenzaprine  5 MG tablet Take 1 tablet  3  times daily as needed for muscle spasms.  . fenofibrate  145 MG tablet TAKE 1 TABLET  DAILY   . furosemide 40 MG tablet Take 0.5-1 tablets  daily as needed  . JARDIANCE 25 MG TABS  TAKE 1 TABLET  DAILY FOR DIABETES  . Levothyroxine  125 MCG tablet TAKE 1 TAB DAILY   . Magnesium 250 MG TABS Take  daily.  . meloxicam  15 MG tablet TAKE 1/2 TO 1 TABLET DAILY   . metFORMIN-XR 500 MG  TAKE 2 TABLETS 2 X /DAY   . montelukast  10 MG tablet TAKE 1 TABLET  DAILY   . omeprazole 40 MG capsule TAKE 1 CAPSULE EVERY DAY  . ondansetron  4 MG tablet Take 1 tablet  every 6  hours as needed for nausea.  Marland Kitchen KLOR-CON 10 MEQ tablet TAKE 1 TABLET EVERY DAY  . pravastatin (20 MG tablet Take 1 tablet  at bedtime.  . pregabalin  75 MG capsule TAKE 1-2 CAPSULE 3 X /DAY   . PREMPRO  0.45-1.5 MG tablet TAKE 1 TABLET B EVERY DAY  . traMADol  50 MG tablet Take 1 to 2 tablets every 4 hours if needed    . traZODone  150 MG tablet Take 1/2 to 1 tablet 1 hour before Bedtime  . valACYclovir  1000 MG tablet Take  daily as needed 3 x /day   . VASCEPA 1 g capsule TAKE 1 CAPSULE  2 ) TIMES DAILY.     Allergies  Allergen Reactions  . Tetanus Toxoids Other (See Comments)    Injection site abcess  . Sulfa Antibiotics Hives    itching    Past Medical History:  Diagnosis Date  . COVID-19   . Diabetes mellitus without complication (HCC)   . Diverticulitis   . GERD (gastroesophageal reflux disease)   . Hyperlipidemia   . IBS (irritable bowel syndrome)   . Insomnia 12/10/2019  . Migraine   . Pneumonia 11/2017  . Thyroid disease   . UTI (urinary tract infection)     Health Maintenance  Topic Date Due  . Hepatitis C Screening  Never done  . MAMMOGRAM  Never done  . PAP SMEAR-Modifier  02/25/2020  . INFLUENZA VACCINE  03/12/2020  . FOOT EXAM  10/05/2020  . URINE MICROALBUMIN  10/05/2020  . HEMOGLOBIN A1C  12/25/2020  . OPHTHALMOLOGY EXAM  07/18/2021  . COLONOSCOPY (Pts 45-77yrs Insurance coverage will need to be confirmed)  12/08/2029  . PNEUMOCOCCAL POLYSACCHARIDE VACCINE AGE 24-64 HIGH RISK  Completed  . COVID-19 Vaccine  Completed  . HIV Screening  Completed  . HPV VACCINES  Aged Out  . TETANUS/TDAP  Discontinued    Immunization History  Administered Date(s) Administered  . Influenza Inj Mdck Quad With Preservative 07/22/2017, 05/14/2018  . PFIZER Comirnaty(Gray Top)Covid-19 Tri-Sucrose Vaccine 09/16/2020  . PFIZER(Purple Top)SARS-COV-2 Vaccination 12/30/2019, 01/27/2020  . Pneumococcal Polysaccharide-23 08/20/2018   Last EGD -  12/09/2019 - Dr Myrtie Neither - Normal.  Last Colon - 12/09/2019 - Dr Myrtie Neither - Sigmoid Diverticulosis - Recc 10 yr f/u  Last MGM - overdue & patient aware  Past Surgical History:  Procedure Laterality Date  . CESAREAN SECTION  1993  .  cystoscopies     multiple  . FUNCTIONAL ENDOSCOPIC SINUS SURGERY  2003  . LAPAROSCOPIC OVARIAN CYSTECTOMY  1983  . TONSILLECTOMY  1973  . TUBAL LIGATION    . TYMPANOPLASTY W/ MASTOIDECTOMY Left 2016 & 2017    Family History  Problem Relation Age of Onset  . Suicidality Mother   . Depression Mother   . Alcohol abuse Father   .  Kidney disease Father   . Suicidality Father   . Depression Father   . Breast cancer Maternal Aunt   . Heart disease Maternal Grandmother   . Breast cancer Maternal Grandmother   . Suicidality Maternal Grandfather   . Stroke Paternal Grandmother   . Heart disease Paternal Grandmother   . Heart disease Paternal Grandfather   . Esophageal cancer Maternal Uncle   . Colon cancer Neg Hx   . Pancreatic cancer Neg Hx   . Stomach cancer Neg Hx   . Liver disease Neg Hx     Social History   Tobacco Use  . Smoking status: Former Smoker    Packs/day: 0.50    Years: 30.00    Pack years: 15.00    Types: Cigarettes    Quit date: 08/24/2017    Years since quitting: 3.1  . Smokeless tobacco: Never Used  Vaping Use  . Vaping Use: Never used  Substance Use Topics  . Alcohol use: No  . Drug use: No    ROS Constitutional: Denies fever, chills, weight loss/gain, headaches, insomnia,  night sweats, and change in appetite. Does c/o fatigue. Eyes: Denies redness, blurred vision, diplopia, discharge, itchy, watery eyes.  ENT: Denies discharge, congestion, post nasal drip, epistaxis, sore throat, earache, hearing loss, dental pain, Tinnitus, Vertigo, Sinus pain, snoring.  Cardio: Denies chest pain, palpitations, irregular heartbeat, syncope, dyspnea, diaphoresis, orthopnea, PND, claudication, edema Respiratory: denies cough, dyspnea, DOE, pleurisy, hoarseness, laryngitis, wheezing.  Gastrointestinal: Denies dysphagia, heartburn, reflux, water brash, pain, cramps, nausea, vomiting, bloating, diarrhea, constipation, hematemesis, melena, hematochezia, jaundice,  hemorrhoids Genitourinary: Denies dysuria, frequency, urgency, nocturia, hesitancy, discharge, hematuria, flank pain Breast: Breast lumps, nipple discharge, bleeding.  Musculoskeletal: Denies arthralgia, myalgia, stiffness, Jt. Swelling, pain, limp, and strain/sprain. Denies falls. Skin: Denies puritis, rash, hives, warts, acne, eczema, changing in skin lesion Neuro: No weakness, tremor, incoordination, spasms, paresthesia, pain Psychiatric: Denies confusion, memory loss, sensory loss. Denies Depression. Endocrine: Denies change in weight, skin, hair change, nocturia, and paresthesia, diabetic polys, visual blurring, hyper / hypo glycemic episodes.  Heme/Lymph: No excessive bleeding, bruising, enlarged lymph nodes.  Physical Exam  BP 110/66   Pulse 98   Temp (!) 97.5 F (36.4 C)   Resp 16   Ht 5\' 3"  (1.6 m)   Wt 179 lb (81.2 kg)   SpO2 98%   BMI 31.71 kg/m   General Appearance: Well nourished, well groomed and in no apparent distress.  Eyes: PERRLA, EOMs, conjunctiva no swelling or erythema, normal fundi and vessels. Sinuses: No frontal/maxillary tenderness ENT/Mouth: EACs patent / TMs  nl. Nares clear without erythema, swelling, mucoid exudates. Oral hygiene is good. No erythema, swelling, or exudate. Tongue normal, non-obstructing. Tonsils not swollen or erythematous. Hearing normal.  Neck: Supple, thyroid not palpable. No bruits, nodes or JVD. Respiratory: Respiratory effort normal.  BS equal and clear bilateral without rales, rhonci, wheezing or stridor. Cardio: Heart sounds are normal with regular rate and rhythm and no murmurs, rubs or gallops. Peripheral pulses are normal and equal bilaterally without edema. No aortic or femoral bruits. Chest: symmetric with normal excursions and percussion. Breasts: Symmetric, without lumps, nipple discharge, retractions, or fibrocystic changes.  Abdomen: Flat, soft with bowel sounds active. Nontender, no guarding, rebound, hernias, masses,  or organomegaly.  Lymphatics: Non tender without lymphadenopathy.  Genitourinary:  Musculoskeletal: Full ROM all peripheral extremities, joint stability, 5/5 strength, and normal gait. Skin: Warm and dry without rashes, lesions, cyanosis, clubbing or  ecchymosis.  Neuro: Cranial nerves intact, reflexes  equal bilaterally. Normal muscle tone, no cerebellar symptoms. Sensation intact.  Pysch: Alert and oriented X 3, normal affect, Insight and Judgment appropriate.   Assessment and Plan  1. Annual Preventative Screening Examination   2. Labile hypertension  - EKG 12-Lead - US, RETROPERITNL ABD,  LTD - Urinalysis, Routine w reflex microscopic - Microalbumin / creatinine urine ratio - CBC with Differential/Platelet - COMPLETE METABOLIC PANEL WITH GFR - Magnesium - TSH  3. Hyperlipidemia associated with type 2 diabetes mellitus (HCC)  - EKG 12-Lead - US, RETROPERITNL ABD,  LTD - Lipid panel - TSH  4. Type 2 diabetes mellitus with diabetic neuropathy,  without long-term current use of insulin (HCC)  - EKG 12-Lead - US, RETROPERITNL ABD,  LTD - HM DIABETES FOOT EXAM - LOW EXTREMITY NEUR EXAM DOCUM - Hemoglobin A1c - Insulin, random  5. Vitamin D deficiency  - VITAMIN D 25 Hydroxy (Vit-D Deficiency, Fractures)  6. Hypothyroidism, unspecified type  - TSH  7. 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 Inflammatory polyarthritis (HCC)   10. Screening for ischemic heart disease  - EKG 12-Lead  11. Former smoker  - EKG 12-Lead - US, RETROPERITNL ABD,  LTD  12. Screening for AAA (aortic abdominal aneurysm)  - US, RETROPERITNL ABD,  LTD  13. Fatigue  - Iron,Total/Total Iron Binding Cap - Vitamin B12 - CBC with Differential/Platelet - TSH  14. Medication management  - Urinalysis, Routine w reflex microscopic - Microalbumin / creatinine urine ratio - CBC with  Differential/Platelet - COMPLETE METABOLIC PANEL WITH GFR - Magnesium - Lipid panel - TSH - Hemoglobin A1c - Insulin, random - VITAMIN D 25 Hydroxy         Patient was counseled in prudent diet to achieve/maintain BMI less than 25 for weight control, BP monitoring, regular exercise and medications. Discussed med's effects and SE's. Screening labs and tests as requested with regular follow-up as recommended. Over 40 minutes of exam, counseling, chart review and high complex critical decision making was performed.   Marinus MawWilliam D Martinique Pizzimenti, MD

## 2020-10-30 ENCOUNTER — Encounter: Payer: Self-pay | Admitting: Internal Medicine

## 2020-10-30 NOTE — Patient Instructions (Signed)
Due to recent changes in healthcare laws, you may see the results of your imaging and laboratory studies on MyChart before your provider has had a chance to review them.  We understand that in some cases there may be results that are confusing or concerning to you. Not all laboratory results come back in the same time frame and the provider may be waiting for multiple results in order to interpret others.  Please give [place] 48 hours in order for your provider to thoroughly review all the results before contacting the office for clarification of your results.   ++++++++++++++++++++++++++++++  Vit D  & Vit C 1,000 mg   are recommended to help protect  against the Covid-19 and other Corona viruses.    Also it's recommended  to take  Zinc 50 mg  to help  protect against the Covid-19   and best place to get  is also on [email or web address]  and don't pay more than 6-8 cents /pill !  ================================ Coronavirus (COVID-19) Are you at risk?  Are you at risk for the Coronavirus (COVID-19)?  To be considered HIGH RISK for Coronavirus (COVID-19), you have to meet the following criteria:  . Traveled to [place], [place], [place], [place] or [place]; or in the [place] to [place], [place], [place]  . or [place]; and have fever, cough, and shortness of breath within the last 2 weeks of travel OR . Been in close contact with a person diagnosed with COVID-19 within the last 2 weeks and have  . fever, cough,and shortness of breath .  . IF YOU DO NOT MEET THESE CRITERIA, YOU ARE CONSIDERED LOW RISK FOR COVID-19.  What to do if you are HIGH RISK for COVID-19?  Marland Kitchen If you are having a medical emergency, call 911. . Seek medical care right away. Before you go to a doctor's office, urgent care or emergency department, .  call ahead and tell them about your recent travel, contact with someone diagnosed with COVID-19  .  and your symptoms.  . You should receive instructions from your  physician's office regarding next steps of care.  . When you arrive at healthcare provider, tell the healthcare staff immediately you have returned from  . visiting [place], [place], [place], [place] or [place]; or traveled in the [place] to [place], [place],  . [place] or [place] in the last two weeks or you have been in close contact with a person diagnosed with  . COVID-19 in the last 2 weeks.   . Tell the health care staff about your symptoms: fever, cough and shortness of breath. . After you have been seen by a medical provider, you will be either: o Tested for (COVID-19) and discharged home on quarantine except to seek medical care if  o symptoms worsen, and asked to  - Stay home and avoid contact with others until you get your results (4-5 days)  - Avoid travel on public transportation if possible (such as bus, train, or airplane) or o Sent to the Emergency Department by EMS for evaluation, COVID-19 testing  and  o possible admission depending on your condition and test results.  What to do if you are LOW RISK for COVID-19?  Reduce your risk of any infection by using the same precautions used for avoiding the common cold or flu:  Marland Kitchen Wash your hands often with soap and warm water for at least 20 seconds.  If soap and water are not readily  available,  . use an alcohol-based hand sanitizer with at least 60% alcohol.  . If coughing or sneezing, cover your mouth and nose by coughing or sneezing into the elbow areas of your shirt or coat, .  into a tissue or into your sleeve (not your hands). . Avoid shaking hands with others and consider head nods or verbal greetings only. . Avoid touching your eyes, nose, or mouth with unwashed hands.  . Avoid close contact with people who are sick. . Avoid places or events with large numbers of people in one location, like concerts or sporting events. . Carefully consider travel plans you have or are making. . If you are planning any travel  outside or inside the [place], visit the CDC's Travelers' Health webpage for the latest health notices. . If you have some symptoms but not all symptoms, continue to monitor at home and seek medical attention  . if your symptoms worsen. . If you are having a medical emergency, call 911. >>>>>>>>>>>>>>>>>>>>>>> Preventive Care for Adults  A healthy lifestyle and preventive care can promote health and wellness. Preventive health guidelines for women include the following key practices.  A routine yearly physical is a good way to check with your health care provider about your health and preventive screening. It is a chance to share any concerns and updates on your health and to receive a thorough exam.  Visit your dentist for a routine exam and preventive care every 6 months. Brush your teeth twice a day and floss once a day. Good oral hygiene prevents tooth decay and gum disease.  The frequency of eye exams is based on your age, health, family medical history, use of contact lenses, and other factors. Follow your health care provider's recommendations for frequency of eye exams.  Eat a healthy diet. Foods like vegetables, fruits, whole grains, low-fat dairy products, and lean protein foods contain the nutrients you need without too many calories. Decrease your intake of foods high in solid fats, added sugars, and salt. Eat the right amount of calories for you. Get information about a proper diet from your health care provider, if necessary.  Regular physical exercise is one of the most important things you can do for your health. Most adults should get at least 150 minutes of moderate-intensity exercise (any activity that increases your heart rate and causes you to sweat) each week. In addition, most adults need muscle-strengthening exercises on 2 or more days a week.  Maintain a healthy weight. The body mass index (BMI) is a screening tool to identify possible weight problems. It provides an estimate  of body fat based on height and weight. Your health care provider can find your BMI and can help you achieve or maintain a healthy weight. For adults 20 years and older:  A BMI below 18.5 is considered underweight.  A BMI of 18.5 to 24.9 is normal.  A BMI of 25 to 29.9 is considered overweight.  A BMI of 30 and above is considered obese.  Maintain normal blood lipids and cholesterol levels by exercising and minimizing your intake of saturated fat. Eat a balanced diet with plenty of fruit and vegetables. Blood tests for lipids and cholesterol should begin at age 80 and be repeated every 5 years. If your lipid or cholesterol levels are high, you are over 50, or you are at high risk for heart disease, you may need your cholesterol levels checked more frequently. Ongoing high lipid and cholesterol levels should be treated with  medicines if diet and exercise are not working.  If you smoke, find out from your health care provider how to quit. If you do not use tobacco, do not start.  Lung cancer screening is recommended for adults aged 71-80 years who are at high risk for developing lung cancer because of a history of smoking. A yearly low-dose CT scan of the lungs is recommended for people who have at least a 30-pack-year history of smoking and are a current smoker or have quit within the past 15 years. A pack year of smoking is smoking an average of 1 pack of cigarettes a day for 1 year (for example: 1 pack a day for 30 years or 2 packs a day for 15 years). Yearly screening should continue until the smoker has stopped smoking for at least 15 years. Yearly screening should be stopped for people who develop a health problem that would prevent them from having lung cancer treatment.  High blood pressure causes heart disease and increases the risk of stroke. Your blood pressure should be checked at least every 1 to 2 years. Ongoing high blood pressure should be treated with medicines if weight loss and  exercise do not work.  If you are 11-20 years old, ask your health care provider if you should take aspirin to prevent strokes.  Diabetes screening involves taking a blood sample to check your fasting blood sugar level. This should be done once every 3 years, after age 21, if you are within normal weight and without risk factors for diabetes. Testing should be considered at a younger age or be carried out more frequently if you are overweight and have at least 1 risk factor for diabetes.  Breast cancer screening is essential preventive care for women. You should practice "breast self-awareness." This means understanding the normal appearance and feel of your breasts and may include breast self-examination. Any changes detected, no matter how small, should be reported to a health care provider. Women in their 57s and 30s should have a clinical breast exam (CBE) by a health care provider as part of a regular health exam every 1 to 3 years. After age 28, women should have a CBE every year. Starting at age 76, women should consider having a mammogram (breast X-ray test) every year. Women who have a family history of breast cancer should talk to their health care provider about genetic screening. Women at a high risk of breast cancer should talk to their health care providers about having an MRI and a mammogram every year.  Breast cancer gene (BRCA)-related cancer risk assessment is recommended for women who have family members with BRCA-related cancers. BRCA-related cancers include breast, ovarian, tubal, and peritoneal cancers. Having family members with these cancers may be associated with an increased risk for harmful changes (mutations) in the breast cancer genes BRCA1 and BRCA2. Results of the assessment will determine the need for genetic counseling and BRCA1 and BRCA2 testing.  Routine pelvic exams to screen for cancer are no longer recommended for nonpregnant women who are considered low risk for  cancer of the pelvic organs (ovaries, uterus, and vagina) and who do not have symptoms. Ask your health care provider if a screening pelvic exam is right for you.  If you have had past treatment for cervical cancer or a condition that could lead to cancer, you need Pap tests and screening for cancer for at least 20 years after your treatment. If Pap tests have been discontinued, your risk factors (such  as having a new sexual partner) need to be reassessed to determine if screening should be resumed. Some women have medical problems that increase the chance of getting cervical cancer. In these cases, your health care provider may recommend more frequent screening and Pap tests.  Colorectal cancer can be detected and often prevented. Most routine colorectal cancer screening begins at the age of 60 years and continues through age 35 years. However, your health care provider may recommend screening at an earlier age if you have risk factors for colon cancer. On a yearly basis, your health care provider may provide home test kits to check for hidden blood in the stool. Use of a small camera at the end of a tube, to directly examine the colon (sigmoidoscopy or colonoscopy), can detect the earliest forms of colorectal cancer. Talk to your health care provider about this at age 24, when routine screening begins.  Direct exam of the colon should be repeated every 5-10 years through age 75 years, unless early forms of pre-cancerous polyps or small growths are found.  Hepatitis C blood testing is recommended for all people born from [birth year] through 1965 and any individual with known risks for hepatitis C.  Pra  Osteoporosis is a disease in which the bones lose minerals and strength with aging. This can result in serious bone fractures or breaks. The risk of osteoporosis can be identified using a bone density scan. Women ages 68 years and over and women at risk for fractures or osteoporosis should discuss screening with  their health care providers. Ask your health care provider whether you should take a calcium supplement or vitamin D to reduce the rate of osteoporosis.  Menopause can be associated with physical symptoms and risks. Hormone replacement therapy is available to decrease symptoms and risks. You should talk to your health care provider about whether hormone replacement therapy is right for you.  Use sunscreen. Apply sunscreen liberally and repeatedly throughout the day. You should seek shade when your shadow is shorter than you. Protect yourself by wearing long sleeves, pants, a wide-brimmed hat, and sunglasses year round, whenever you are outdoors.  Once a month, do a whole body skin exam, using a mirror to look at the skin on your back. Tell your health care provider of new moles, moles that have irregular borders, moles that are larger than a pencil eraser, or moles that have changed in shape or color.  Stay current with required vaccines (immunizations).  Influenza vaccine. All adults should be immunized every year.  Tetanus, diphtheria, and acellular pertussis (Td, Tdap) vaccine. Pregnant women should receive 1 dose of Tdap vaccine during each pregnancy. The dose should be obtained regardless of the length of time since the last dose. Immunization is preferred during the 27th-36th week of gestation. An adult who has not previously received Tdap or who does not know her vaccine status should receive 1 dose of Tdap. This initial dose should be followed by tetanus and diphtheria toxoids (Td) booster doses every 10 years. Adults with an unknown or incomplete history of completing a 3-dose immunization series with Td-containing vaccines should begin or complete a primary immunization series including a Tdap dose. Adults should receive a Td booster every 10 years.  Varicella vaccine. An adult without evidence of immunity to varicella should receive 2 doses or a second dose if she has previously received 1  dose. Pregnant females who do not have evidence of immunity should receive the first dose after pregnancy. This first dose  should be obtained before leaving the health care facility. The second dose should be obtained 4-8 weeks after the first dose.  Human papillomavirus (HPV) vaccine. Females aged 13-26 years who have not received the vaccine previously should obtain the 3-dose series. The vaccine is not recommended for use in pregnant females. However, pregnancy testing is not needed before receiving a dose. If a female is found to be pregnant after receiving a dose, no treatment is needed. In that case, the remaining doses should be delayed until after the pregnancy. Immunization is recommended for any person with an immunocompromised condition through the age of 32 years if she did not get any or all doses earlier. During the 3-dose series, the second dose should be obtained 4-8 weeks after the first dose. The third dose should be obtained 24 weeks after the first dose and 16 weeks after the second dose.  Zoster vaccine. One dose is recommended for adults aged 4 years or older unless certain conditions are present.  Measles, mumps, and rubella (MMR) vaccine. Adults born before [birth year] generally are considered immune to measles and mumps. Adults born in [birth year] or later should have 1 or more doses of MMR vaccine unless there is a contraindication to the vaccine or there is laboratory evidence of immunity to each of the three diseases. A routine second dose of MMR vaccine should be obtained at least 28 days after the first dose for students attending postsecondary schools, health care workers, or international travelers. People who received inactivated measles vaccine or an unknown type of measles vaccine during 1963-1967 should receive 2 doses of MMR vaccine. People who received inactivated mumps vaccine or an unknown type of mumps vaccine before 1979 and are at high risk for mumps infection should consider  immunization with 2 doses of MMR vaccine. For females of childbearing age, rubella immunity should be determined. If there is no evidence of immunity, females who are not pregnant should be vaccinated. If there is no evidence of immunity, females who are pregnant should delay immunization until after pregnancy. Unvaccinated health care workers born before [birth year] who lack laboratory evidence of measles, mumps, or rubella immunity or laboratory confirmation of disease should consider measles and mumps immunization with 2 doses of MMR vaccine or rubella immunization with 1 dose of MMR vaccine.  Pneumococcal 13-valent conjugate (PCV13) vaccine. When indicated, a person who is uncertain of her immunization history and has no record of immunization should receive the PCV13 vaccine. An adult aged 77 years or older who has certain medical conditions and has not been previously immunized should receive 1 dose of PCV13 vaccine. This PCV13 should be followed with a dose of pneumococcal polysaccharide (PPSV23) vaccine. The PPSV23 vaccine dose should be obtained at least 1 or more year(s) after the dose of PCV13 vaccine. An adult aged 29 years or older who has certain medical conditions and previously received 1 or more doses of PPSV23 vaccine should receive 1 dose of PCV13. The PCV13 vaccine dose should be obtained 1 or more years after the last PPSV23 vaccine dose.    Pneumococcal polysaccharide (PPSV23) vaccine. When PCV13 is also indicated, PCV13 should be obtained first. All adults aged 65 years and older should be immunized. An adult younger than age 35 years who has certain medical conditions should be immunized. Any person who resides in a nursing home or long-term care facility should be immunized. An adult smoker should be immunized. People with an immunocompromised condition and certain other conditions should receive both  PCV13 and PPSV23 vaccines. People with human immunodeficiency virus (HIV) infection should  be immunized as soon as possible after diagnosis. Immunization during chemotherapy or radiation therapy should be avoided. Routine use of PPSV23 vaccine is not recommended for American Indians, [place] Natives, or people younger than 65 years unless there are medical conditions that require PPSV23 vaccine. When indicated, people who have unknown immunization and have no record of immunization should receive PPSV23 vaccine. One-time revaccination 5 years after the first dose of PPSV23 is recommended for people aged 19-64 years who have chronic kidney failure, nephrotic syndrome, asplenia, or immunocompromised conditions. People who received 1-2 doses of PPSV23 before age 19 years should receive another dose of PPSV23 vaccine at age 58 years or later if at least 5 years have passed since the previous dose. Doses of PPSV23 are not needed for people immunized with PPSV23 at or after age 106 years.  Preventive Services / Frequency   Ages 28 to 7 years  Blood pressure check.  Lipid and cholesterol check.  Lung cancer screening. / Every year if you are aged 67-80 years and have a 30-pack-year history of smoking and currently smoke or have quit within the past 15 years. Yearly screening is stopped once you have quit smoking for at least 15 years or develop a health problem that would prevent you from having lung cancer treatment.  Clinical breast exam.** / Every year after age 76 years.   BRCA-related cancer risk assessment.** / For women who have family members with a BRCA-related cancer (breast, ovarian, tubal, or peritoneal cancers).  Mammogram.** / Every year beginning at age 64 years and continuing for as long as you are in good health. Consult with your health care provider.  Pap test.** / Every 3 years starting at age 85 years through age 23 or 29 years with a history of 3 consecutive normal Pap tests.  HPV screening.** / Every 3 years from ages 28 years through ages 18 to 42 years with a history  of 3 consecutive normal Pap tests.  Fecal occult blood test (FOBT) of stool. / Every year beginning at age 44 years and continuing until age 44 years. You may not need to do this test if you get a colonoscopy every 10 years.  Flexible sigmoidoscopy or colonoscopy.** / Every 5 years for a flexible sigmoidoscopy or every 10 years for a colonoscopy beginning at age 79 years and continuing until age 76 years.  Hepatitis C blood test.** / For all people born from [birth year] through 1964 and any individual with known risks for hepatitis C.  Skin self-exam. / Monthly.  Influenza vaccine. / Every year.  Tetanus, diphtheria, and acellular pertussis (Tdap/Td) vaccine.** / Consult your health care provider. Pregnant women should receive 1 dose of Tdap vaccine during each pregnancy. 1 dose of Td every 10 years.  Varicella vaccine.** / Consult your health care provider. Pregnant females who do not have evidence of immunity should receive the first dose after pregnancy.  Zoster vaccine.** / 1 dose for adults aged 32 years or older.  Pneumococcal 13-valent conjugate (PCV13) vaccine.** / Consult your health care provider.  Pneumococcal polysaccharide (PPSV23) vaccine.** / 1 to 2 doses if you smoke cigarettes or if you have certain conditions.  Meningococcal vaccine.** / Consult your health care provider.  Hepatitis A vaccine.** / Consult your health care provider.  Hepatitis B vaccine.** / Consult your health care provider. Screening for abdominal aortic aneurysm (AAA)  by ultrasound is recommended for people over 50  who have history of high blood pressure or who are current or former smokers. ++++++++++++++++++ Recommend Adult Low Dose Aspirin or  coated  Aspirin 81 mg daily  To reduce risk of Colon Cancer 40 %,  Skin Cancer 26 % ,  Melanoma 46%  and  Pancreatic cancer 60% +++++++++++++++++++ Vitamin D goal  is between 70-100.  Please make sure that you are taking your Vitamin D as directed.  It  is very important as a natural anti-inflammatory  helping hair, skin, and nails, as well as reducing stroke and heart attack risk.  It helps your bones and helps with mood. It also decreases numerous cancer risks so please take it as directed.  Low Vit D is associated with a 200-300% higher risk for CANCER  and 200-300% higher risk for HEART   ATTACK  &  STROKE.   .....................................Marland Kitchen It is also associated with higher death rate at younger ages,  autoimmune diseases like Rheumatoid arthritis, Lupus, Multiple Sclerosis.    Also many other serious conditions, like depression, Alzheimer's Dementia, infertility, muscle aches, fatigue, fibromyalgia - just to name a few. ++++++++++++++++++ Recommend the book "The END of DIETING" by Dr Excell Seltzer  & the book "The END of DIABETES " by Dr Excell Seltzer At [email or web address] - get book & Audio CD's    Being diabetic has a  300% increased risk for heart attack, stroke, cancer, and alzheimer- type vascular dementia. It is very important that you work harder with diet by avoiding all foods that are white. Avoid white rice (brown & wild rice is OK), white potatoes (sweetpotatoes in moderation is OK), White bread or wheat bread or anything made out of white flour like bagels, donuts, rolls, buns, biscuits, cakes, pastries, cookies, pizza crust, and pasta (made from white flour & egg whites) - vegetarian pasta or spinach or wheat pasta is OK. Multigrain breads like Arnold's or [employer], or multigrain sandwich thins or flatbreads.  Diet, exercise and weight loss can reverse and cure diabetes in the early stages.  Diet, exercise and weight loss is very important in the control and prevention of complications of diabetes which affects every system in your body, ie. Brain - dementia/stroke, eyes - glaucoma/blindness, heart - heart attack/heart failure, kidneys - dialysis, stomach - gastric paralysis, intestines - malabsorption, nerves - severe painful  neuritis, circulation - gangrene & loss of a leg(s), and finally cancer and Alzheimers.    I recommend avoid fried & greasy foods,  sweets/candy, white rice (brown or wild rice or Quinoa is OK), white potatoes (sweet potatoes are OK) - anything made from white flour - bagels, doughnuts, rolls, buns, biscuits,white and wheat breads, pizza crust and traditional pasta made of white flour & egg white(vegetarian pasta or spinach or wheat pasta is OK).  Multi-grain bread is OK - like multi-grain flat bread or sandwich thins. Avoid alcohol in excess. Exercise is also important.    Eat all the vegetables you want - avoid meat, especially red meat and dairy - especially cheese.  Cheese is the most concentrated form of trans-fats which is the worst thing to clog up our arteries. Veggie cheese is OK which can be found in the fresh produce section at [employer] or [employer] or [employer]  ++++++++++++++++++++++ DASH Eating Plan  DASH stands for "Dietary Approaches to Stop Hypertension."   The DASH eating plan is a healthy eating plan that has been shown to reduce high blood pressure (hypertension). Additional health benefits may include reducing the  risk of type 2 diabetes mellitus, heart disease, and stroke. The DASH eating plan may also help with weight loss. WHAT DO I NEED TO KNOW ABOUT THE DASH EATING PLAN? For the DASH eating plan, you will follow these general guidelines:  Choose foods with a percent daily value for sodium of less than 5% (as listed on the food label).  Use salt-free seasonings or herbs instead of table salt or sea salt.  Check with your health care provider or pharmacist before using salt substitutes.  Eat lower-sodium products, often labeled as "lower sodium" or "no salt added."  Eat fresh foods.  Eat more vegetables, fruits, and low-fat dairy products.  Choose whole grains. Look for the word "whole" as the first word in the ingredient list.  Choose fish   Limit  sweets, desserts, sugars, and sugary drinks.  Choose heart-healthy fats.  Eat veggie cheese   Eat more home-cooked food and less restaurant, buffet, and fast food.  Limit fried foods.  Cook foods using methods other than frying.  Limit canned vegetables. If you do use them, rinse them well to decrease the sodium.  When eating at a restaurant, ask that your food be prepared with less salt, or no salt if possible.                      WHAT FOODS CAN I EAT? Read Dr Fara Olden Fuhrman's books on The End of Dieting & The End of Diabetes  Grains Whole grain or whole wheat bread. Brown rice. Whole grain or whole wheat pasta. Quinoa, bulgur, and whole grain cereals. Low-sodium cereals. Corn or whole wheat flour tortillas. Whole grain cornbread. Whole grain crackers. Low-sodium crackers.  Vegetables Fresh or frozen vegetables (raw, steamed, roasted, or grilled). Low-sodium or reduced-sodium tomato and vegetable juices. Low-sodium or reduced-sodium tomato sauce and paste. Low-sodium or reduced-sodium canned vegetables.   Fruits All fresh, canned (in natural juice), or frozen fruits.  Protein Products  All fish and seafood.  Dried beans, peas, or lentils. Unsalted nuts and seeds. Unsalted canned beans.  Dairy Low-fat dairy products, such as skim or 1% milk, 2% or reduced-fat cheeses, low-fat ricotta or cottage cheese, or plain low-fat yogurt. Low-sodium or reduced-sodium cheeses.  Fats and Oils Tub margarines without trans fats. Light or reduced-fat mayonnaise and salad dressings (reduced sodium). Avocado. Safflower, olive, or canola oils. Natural peanut or almond butter.  Other Unsalted popcorn and pretzels. The items listed above may not be a complete list of recommended foods or beverages. Contact your dietitian for more options.  ++++++++++++++++++  WHAT FOODS ARE NOT RECOMMENDED? Grains/ White flour or wheat flour White bread. White pasta. White rice. Refined cornbread. Bagels and  croissants. Crackers that contain trans fat.  Vegetables  Creamed or fried vegetables. Vegetables in a . Regular canned vegetables. Regular canned tomato sauce and paste. Regular tomato and vegetable juices.  Fruits Dried fruits. Canned fruit in light or heavy syrup. Fruit juice.  Meat and Other Protein Products Meat in general - RED meat & White meat.  Fatty cuts of meat. Ribs, chicken wings, all processed meats as bacon, sausage, bologna, salami, fatback, hot dogs, bratwurst and packaged luncheon meats.  Dairy Whole or 2% milk, cream, half-and-half, and cream cheese. Whole-fat or sweetened yogurt. Full-fat cheeses or blue cheese. Non-dairy creamers and whipped toppings. Processed cheese, cheese spreads, or cheese curds.  Condiments Onion and garlic salt, seasoned salt, table salt, and sea salt. Canned and packaged gravies. Worcestershire sauce. Tartar sauce.  Barbecue sauce. Teriyaki sauce. Soy sauce, including reduced sodium. Steak sauce. Fish sauce. Oyster sauce. Cocktail sauce. Horseradish. Ketchup and mustard. Meat flavorings and tenderizers. Bouillon cubes. Hot sauce. Tabasco sauce. Marinades. Taco seasonings. Relishes.  Fats and Oils Butter, stick margarine, lard, shortening and bacon fat. Coconut, palm kernel, or palm oils. Regular salad dressings.  Pickles and olives. Salted popcorn and pretzels.  The items listed above may not be a complete list of foods and beverages to avoid.

## 2020-10-31 ENCOUNTER — Other Ambulatory Visit: Payer: Self-pay

## 2020-10-31 ENCOUNTER — Ambulatory Visit (INDEPENDENT_AMBULATORY_CARE_PROVIDER_SITE_OTHER): Payer: Managed Care, Other (non HMO) | Admitting: Internal Medicine

## 2020-10-31 VITALS — BP 110/66 | HR 98 | Temp 97.5°F | Resp 16 | Ht 63.0 in | Wt 179.0 lb

## 2020-10-31 DIAGNOSIS — Z8249 Family history of ischemic heart disease and other diseases of the circulatory system: Secondary | ICD-10-CM | POA: Diagnosis not present

## 2020-10-31 DIAGNOSIS — Z136 Encounter for screening for cardiovascular disorders: Secondary | ICD-10-CM | POA: Diagnosis not present

## 2020-10-31 DIAGNOSIS — Z1211 Encounter for screening for malignant neoplasm of colon: Secondary | ICD-10-CM

## 2020-10-31 DIAGNOSIS — Z131 Encounter for screening for diabetes mellitus: Secondary | ICD-10-CM | POA: Diagnosis not present

## 2020-10-31 DIAGNOSIS — E1169 Type 2 diabetes mellitus with other specified complication: Secondary | ICD-10-CM

## 2020-10-31 DIAGNOSIS — Z130 Encounter for screening for diseases of the blood and blood-forming organs and certain disorders involving the immune mechanism: Secondary | ICD-10-CM

## 2020-10-31 DIAGNOSIS — R5383 Other fatigue: Secondary | ICD-10-CM

## 2020-10-31 DIAGNOSIS — Z0000 Encounter for general adult medical examination without abnormal findings: Secondary | ICD-10-CM | POA: Diagnosis not present

## 2020-10-31 DIAGNOSIS — Z79899 Other long term (current) drug therapy: Secondary | ICD-10-CM

## 2020-10-31 DIAGNOSIS — Z87891 Personal history of nicotine dependence: Secondary | ICD-10-CM

## 2020-10-31 DIAGNOSIS — E039 Hypothyroidism, unspecified: Secondary | ICD-10-CM

## 2020-10-31 DIAGNOSIS — Z13220 Encounter for screening for lipoid disorders: Secondary | ICD-10-CM

## 2020-10-31 DIAGNOSIS — Z0001 Encounter for general adult medical examination with abnormal findings: Secondary | ICD-10-CM

## 2020-10-31 DIAGNOSIS — Z1389 Encounter for screening for other disorder: Secondary | ICD-10-CM | POA: Diagnosis not present

## 2020-10-31 DIAGNOSIS — E559 Vitamin D deficiency, unspecified: Secondary | ICD-10-CM | POA: Diagnosis not present

## 2020-10-31 DIAGNOSIS — E785 Hyperlipidemia, unspecified: Secondary | ICD-10-CM

## 2020-10-31 DIAGNOSIS — E1140 Type 2 diabetes mellitus with diabetic neuropathy, unspecified: Secondary | ICD-10-CM

## 2020-10-31 DIAGNOSIS — R0989 Other specified symptoms and signs involving the circulatory and respiratory systems: Secondary | ICD-10-CM

## 2020-10-31 DIAGNOSIS — U099 Post covid-19 condition, unspecified: Secondary | ICD-10-CM

## 2020-10-31 DIAGNOSIS — M064 Inflammatory polyarthropathy: Secondary | ICD-10-CM

## 2020-10-31 MED ORDER — PREGABALIN 200 MG PO CAPS: ORAL_CAPSULE | ORAL | 1 refills | Status: DC

## 2020-10-31 NOTE — Progress Notes (Signed)
AortaScan < 3 cm. Within normal limits, per Dr McKeown. 

## 2020-10-31 NOTE — Addendum Note (Signed)
Addended by: [name] on: 10/31/2020 08:04 PM   Modules accepted: Orders

## 2020-11-01 ENCOUNTER — Other Ambulatory Visit: Payer: Self-pay | Admitting: Internal Medicine

## 2020-11-01 DIAGNOSIS — N390 Urinary tract infection, site not specified: Secondary | ICD-10-CM

## 2020-11-01 LAB — COMPLETE METABOLIC PANEL WITH GFR
AG Ratio: 2 (calc) (ref 1.0–2.5)
ALT: 25 U/L (ref 6–29)
AST: 35 U/L (ref 10–35)
Albumin: 4.3 g/dL (ref 3.6–5.1)
Alkaline phosphatase (APISO): 85 U/L (ref 37–153)
BUN: 22 mg/dL (ref 7–25)
CO2: 26 mmol/L (ref 20–32)
Calcium: 10.2 mg/dL (ref 8.6–10.4)
Chloride: 106 mmol/L (ref 98–110)
Creat: 0.68 mg/dL (ref 0.50–1.05)
GFR, Est African American: 116 mL/min/{1.73_m2} (ref >=60)
GFR, Est Non African American: 100 mL/min/{1.73_m2} (ref >=60)
Globulin: 2.2 g/dL (calc) (ref 1.9–3.7)
Glucose, Bld: 146 mg/dL — ABNORMAL HIGH (ref 65–99)
Potassium: 4.1 mmol/L (ref 3.5–5.3)
Sodium: 141 mmol/L (ref 135–146)
Total Bilirubin: 0.4 mg/dL (ref 0.2–1.2)
Total Protein: 6.5 g/dL (ref 6.1–8.1)

## 2020-11-01 LAB — URINALYSIS, ROUTINE W REFLEX MICROSCOPIC
Bacteria, UA: NONE SEEN /HPF
Bilirubin Urine: NEGATIVE
Hyaline Cast: NONE SEEN /LPF
Ketones, ur: NEGATIVE
Leukocytes,Ua: NEGATIVE
Nitrite: NEGATIVE
Specific Gravity, Urine: 1.045 — ABNORMAL HIGH (ref 1.001–1.03)
pH: 5.5 (ref 5.0–8.0)

## 2020-11-01 LAB — VITAMIN B12: Vitamin B-12: 336 pg/mL (ref 200–1100)

## 2020-11-01 LAB — MICROALBUMIN / CREATININE URINE RATIO
Creatinine, Urine: 71 mg/dL (ref 20–275)
Microalb Creat Ratio: 70 mcg/mg creat — ABNORMAL HIGH (ref <30)
Microalb, Ur: 5 mg/dL

## 2020-11-01 LAB — IRON, TOTAL/TOTAL IRON BINDING CAP
%SAT: 20 % (calc) (ref 16–45)
Iron: 79 ug/dL (ref 45–160)
TIBC: 401 mcg/dL (calc) (ref 250–450)

## 2020-11-01 LAB — LIPID PANEL
Cholesterol: 126 mg/dL (ref <200)
HDL: 30 mg/dL — ABNORMAL LOW (ref >=50)
Non-HDL Cholesterol (Calc): 96 mg/dL (calc) (ref <130)
Total CHOL/HDL Ratio: 4.2 (calc) (ref <5.0)
Triglycerides: 709 mg/dL — ABNORMAL HIGH (ref <150)

## 2020-11-01 LAB — TSH: TSH: 0.54 mIU/L

## 2020-11-01 LAB — CBC WITH DIFFERENTIAL/PLATELET
Absolute Monocytes: 621 cells/uL (ref 200–950)
Basophils Absolute: 80 cells/uL (ref 0–200)
Basophils Relative: 1.4 %
Eosinophils Absolute: 171 cells/uL (ref 15–500)
Eosinophils Relative: 3 %
HCT: 40.9 % (ref 35.0–45.0)
Hemoglobin: 13.7 g/dL (ref 11.7–15.5)
Lymphs Abs: 2058 cells/uL (ref 850–3900)
MCH: 30.2 pg (ref 27.0–33.0)
MCHC: 33.5 g/dL (ref 32.0–36.0)
MCV: 90.3 fL (ref 80.0–100.0)
MPV: 11 fL (ref 7.5–12.5)
Monocytes Relative: 10.9 %
Neutro Abs: 2770 cells/uL (ref 1500–7800)
Neutrophils Relative %: 48.6 %
Platelets: 318 10*3/uL (ref 140–400)
RBC: 4.53 10*6/uL (ref 3.80–5.10)
RDW: 13.6 % (ref 11.0–15.0)
Total Lymphocyte: 36.1 %
WBC: 5.7 10*3/uL (ref 3.8–10.8)

## 2020-11-01 LAB — MAGNESIUM: Magnesium: 1.9 mg/dL (ref 1.5–2.5)

## 2020-11-01 LAB — HEMOGLOBIN A1C
Hgb A1c MFr Bld: 7.6 % of total Hgb — ABNORMAL HIGH (ref <5.7)
Mean Plasma Glucose: 171 mg/dL
eAG (mmol/L): 9.5 mmol/L

## 2020-11-01 LAB — MICROSCOPIC MESSAGE

## 2020-11-01 LAB — INSULIN, RANDOM: Insulin: 40 u[IU]/mL — ABNORMAL HIGH

## 2020-11-01 LAB — VITAMIN D 25 HYDROXY (VIT D DEFICIENCY, FRACTURES): Vit D, 25-Hydroxy: 40 ng/mL (ref 30–100)

## 2020-11-01 NOTE — Progress Notes (Signed)
============================================================ - Test results slightly outside the reference range are not unusual. If there is anything important, I will review this with you,  otherwise it is considered normal test values.  If you have further questions,  please do not hesitate to contact me at the office or via My Chart.  ============================================================ ============================================================  -  U/A - is suspicious for UTI - will request culture  ============================================================ ============================================================  - Iron levels - Normal  ============================================================ ============================================================  -  -  Vitamin B12 =   336  -     Very Low  (Ideal or Goal Vit B12 is between 450 - 1,100)   Low Vit B12 may be associated with Anemia , Fatigue,   Peripheral Neuropathy, Dementia, "Brain Fog", & Depression  - Recommend take a sub-lingual form of Vitamin B12 tablet   1,000 to 5,000 mcg tab that you dissolve under your tongue /Daily   - Can get Lavonia Dana - best price at [employer] or on [employer] ============================================================ ============================================================  - Total Chol = 126    and    LDL Chol = 139  -  both  Excellent   - Very low risk for Heart Attack  / Stroke ============================================================ ============================================================  -  A1c 7.6% - still too high   Your blood sugar and A1c are elevated.    -   It is very important that you work harder with diet by  avoiding all foods that are white except chicken,fish & calliflower.  - Avoid white rice  (brown & wild rice is OK),   - Avoid white potatoes  (sweet potatoes in moderation is OK),   White bread or wheat bread or anything  made out of   white flour like bagels, donuts, rolls, buns, biscuits, cakes,  - pastries, cookies, pizza crust & pasta (made from white flour & egg whites)   - vegetarian pasta or spinach or wheat pasta is OK.  - Multigrain breads like Arnold's, [employer] or   multigrain sandwich thins or high fiber breads like   [place] bread or "Dave's Killer" breads that are  4 to 5 grams fiber per slice !  are best.   ============================================================ ============================================================  - Vitamin D = 40 - Low   - Vitamin D goal is between 70-100.   - Please make sure that you are taking                                                           your Vitamin D 10,000 u /day as directed.   - It is very important as a natural anti-inflammatory and helping the                   immune system protect against viral infections, like the Covid-19    helping hair, skin, and nails, as well as reducing stroke and heart attack risk.   - It helps your bones and helps with mood.  - It also decreases numerous cancer risks so please  take it as directed.   - Low Vit D is associated with a 200-300% higher risk for CANCER   and 200-300% higher risk for HEART   ATTACK  &  STROKE.    - It is also associated with higher death rate at younger ages,   autoimmune diseases like Rheumatoid arthritis, Lupus,  Multiple Sclerosis.     - Also many other serious conditions, like depression, Alzheimer's  Dementia, infertility, muscle aches, fatigue, fibromyalgia   - just to name a few. ============================================================ ============================================================  - All Else - CBC - Kidneys - Electrolytes - Liver - Magnesium & Thyroid    - all  Normal / OK ===========================================================

## 2020-11-04 LAB — URINE CULTURE
MICRO NUMBER:: 11691414
SPECIMEN QUALITY:: ADEQUATE

## 2020-11-04 NOTE — Progress Notes (Signed)
============================================================ ============================================================  -    Urine Culture returned - OK - No Infection.   - But if UTI sx's persist (Dysuria)                                            - can put in an order to recheck Urine Culture  ============================================================ ============================================================

## 2020-12-09 ENCOUNTER — Other Ambulatory Visit: Payer: Self-pay | Admitting: Adult Health

## 2020-12-09 ENCOUNTER — Other Ambulatory Visit: Payer: Self-pay | Admitting: Gastroenterology

## 2020-12-09 DIAGNOSIS — Z9109 Other allergy status, other than to drugs and biological substances: Secondary | ICD-10-CM

## 2020-12-09 DIAGNOSIS — K219 Gastro-esophageal reflux disease without esophagitis: Secondary | ICD-10-CM

## 2020-12-11 NOTE — Telephone Encounter (Signed)
Refill request from pharmacy.  Is she still taking this medicine? It has been a year since I last saw her, so not clear if she still needs it.  If she is still taking it, I would like her to decrease it to every other day and make an appointment to see me.  Med would need refill as well.  - HD

## 2020-12-12 NOTE — Telephone Encounter (Signed)
Left message to return call 

## 2020-12-13 ENCOUNTER — Other Ambulatory Visit: Payer: Self-pay

## 2020-12-13 DIAGNOSIS — M2550 Pain in unspecified joint: Secondary | ICD-10-CM

## 2020-12-13 DIAGNOSIS — M353 Polymyalgia rheumatica: Secondary | ICD-10-CM

## 2020-12-14 MED ORDER — TRAMADOL HCL 50 MG PO TABS: ORAL_TABLET | ORAL | 0 refills | Status: DC

## 2021-01-14 ENCOUNTER — Other Ambulatory Visit: Payer: Self-pay | Admitting: Internal Medicine

## 2021-01-14 DIAGNOSIS — G4700 Insomnia, unspecified: Secondary | ICD-10-CM

## 2021-01-18 ENCOUNTER — Emergency Department (HOSPITAL_COMMUNITY)
Admission: EM | Admit: 2021-01-18 | Discharge: 2021-01-18 | Disposition: A | Discharge status: Home / Self Care | Payer: Managed Care, Other (non HMO) | Attending: Emergency Medicine | Admitting: Emergency Medicine

## 2021-01-18 ENCOUNTER — Encounter (HOSPITAL_COMMUNITY): Payer: Self-pay | Admitting: Emergency Medicine

## 2021-01-18 ENCOUNTER — Emergency Department (HOSPITAL_COMMUNITY): Payer: Managed Care, Other (non HMO)

## 2021-01-18 DIAGNOSIS — Z8616 Personal history of COVID-19: Secondary | ICD-10-CM | POA: Diagnosis not present

## 2021-01-18 DIAGNOSIS — Z79899 Other long term (current) drug therapy: Secondary | ICD-10-CM | POA: Insufficient documentation

## 2021-01-18 DIAGNOSIS — Z7984 Long term (current) use of oral hypoglycemic drugs: Secondary | ICD-10-CM | POA: Insufficient documentation

## 2021-01-18 DIAGNOSIS — I110 Hypertensive heart disease with heart failure: Secondary | ICD-10-CM | POA: Insufficient documentation

## 2021-01-18 DIAGNOSIS — E039 Hypothyroidism, unspecified: Secondary | ICD-10-CM | POA: Insufficient documentation

## 2021-01-18 DIAGNOSIS — E785 Hyperlipidemia, unspecified: Secondary | ICD-10-CM | POA: Diagnosis not present

## 2021-01-18 DIAGNOSIS — Z7982 Long term (current) use of aspirin: Secondary | ICD-10-CM | POA: Diagnosis not present

## 2021-01-18 DIAGNOSIS — R002 Palpitations: Secondary | ICD-10-CM | POA: Diagnosis not present

## 2021-01-18 DIAGNOSIS — I5032 Chronic diastolic (congestive) heart failure: Secondary | ICD-10-CM | POA: Diagnosis not present

## 2021-01-18 DIAGNOSIS — R197 Diarrhea, unspecified: Secondary | ICD-10-CM | POA: Insufficient documentation

## 2021-01-18 DIAGNOSIS — E1169 Type 2 diabetes mellitus with other specified complication: Secondary | ICD-10-CM | POA: Diagnosis not present

## 2021-01-18 DIAGNOSIS — Z87891 Personal history of nicotine dependence: Secondary | ICD-10-CM | POA: Insufficient documentation

## 2021-01-18 DIAGNOSIS — R112 Nausea with vomiting, unspecified: Secondary | ICD-10-CM | POA: Diagnosis not present

## 2021-01-18 LAB — CBC
HCT: 45.3 % (ref 36.0–46.0)
Hemoglobin: 14.5 g/dL (ref 12.0–15.0)
MCH: 30 pg (ref 26.0–34.0)
MCHC: 32 g/dL (ref 30.0–36.0)
MCV: 93.6 fL (ref 80.0–100.0)
Platelets: 323 10*3/uL (ref 150–400)
RBC: 4.84 MIL/uL (ref 3.87–5.11)
RDW: 13.6 % (ref 11.5–15.5)
WBC: 8.8 10*3/uL (ref 4.0–10.5)
nRBC: 0 % (ref 0.0–0.2)

## 2021-01-18 LAB — BASIC METABOLIC PANEL
Anion gap: 13 (ref 5–15)
BUN: 19 mg/dL (ref 6–20)
CO2: 23 mmol/L (ref 22–32)
Calcium: 10.7 mg/dL — ABNORMAL HIGH (ref 8.9–10.3)
Chloride: 101 mmol/L (ref 98–111)
Creatinine, Ser: 0.57 mg/dL (ref 0.44–1.00)
GFR, Estimated: >60 mL/min (ref >60)
Glucose, Bld: 80 mg/dL (ref 70–99)
Potassium: 4.5 mmol/L (ref 3.5–5.1)
Sodium: 137 mmol/L (ref 135–145)

## 2021-01-18 LAB — TROPONIN I (HIGH SENSITIVITY)
Troponin I (High Sensitivity): 3 ng/L (ref <18)
Troponin I (High Sensitivity): 3 ng/L (ref <18)

## 2021-01-18 LAB — HEPATIC FUNCTION PANEL
ALT: 54 U/L — ABNORMAL HIGH (ref 0–44)
AST: 52 U/L — ABNORMAL HIGH (ref 15–41)
Albumin: 4.2 g/dL (ref 3.5–5.0)
Alkaline Phosphatase: 58 U/L (ref 38–126)
Bilirubin, Direct: 0.1 mg/dL (ref 0.0–0.2)
Indirect Bilirubin: 0.8 mg/dL (ref 0.3–0.9)
Total Bilirubin: 0.9 mg/dL (ref 0.3–1.2)
Total Protein: 6.9 g/dL (ref 6.5–8.1)

## 2021-01-18 MED ORDER — ONDANSETRON 4 MG PO TBDP: 4.0000 mg | ORAL_TABLET | Freq: Three times a day (TID) | ORAL | 0 refills | Status: DC | PRN

## 2021-01-18 MED ORDER — ONDANSETRON HCL 4 MG/2ML IJ SOLN
4.0000 mg | Freq: Once | INTRAMUSCULAR | Status: AC
  Administered 2021-01-18: 4 mg via INTRAVENOUS
  Filled 2021-01-18: qty 2

## 2021-01-18 MED ORDER — SODIUM CHLORIDE 0.9 % IV BOLUS
1000.0000 mL | Freq: Once | INTRAVENOUS | Status: AC
  Administered 2021-01-18: 1000 mL via INTRAVENOUS

## 2021-01-18 NOTE — ED Provider Notes (Signed)
[facility] EMERGENCY DEPARTMENT Provider Note   CSN: [id number] Arrival date & time: 01/18/21  1051     History Chief Complaint  Patient presents with   Palpitations    Caitlin Barnett is a 55 y.o. female.  The history is provided by the patient.  Palpitations Associated symptoms: chest pain, nausea, shortness of breath and vomiting   Associated symptoms: no back pain and no weakness   Patient presents with a few day history of nausea vomiting and some diarrhea.  States she has not had much oral intake.  Also now began to have some chest pain on the left side of her chest and some palpitations.  States she will feel her heart race at time.  States she had something similar years ago and wore a Holter monitor and no cause was found.  States did not have an episode during that however.  States that palpitations will last for a minute or even up to longer.  Will feel lightheaded at times with it.  No swelling her legs.  No fevers.  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    Past Medical History:  Diagnosis Date   COVID-19    Diabetes mellitus without complication ([place])    Diverticulitis    GERD (gastroesophageal reflux disease)    Hyperlipidemia    IBS (irritable bowel syndrome)    Insomnia 12/10/2019   Migraine    Pneumonia 11/2017   Thyroid disease    UTI (urinary tract infection)     Patient Active Problem List   Diagnosis Date Noted   Dyspnea on minimal exertion 03/22/2020   Insomnia 12/10/2019   Fatigue 08/21/2018   Former smoker 08/21/2018   FHx: heart disease 08/21/2018   Class 1 obesity due to excess calories with serious comorbidity and body mass index (BMI) of 31.0 to 31.9 in adult 08/21/2018   Hypertension 08/21/2018   Chronic suppurative otitis media of left ear 08/12/2018   Hepatic steatosis 12/09/2017   Chronic diastolic (congestive) heart failure ([place]) 12/01/2017   Bilateral lower extremity edema 11/07/2017   Hyperlipidemia  associated with type 2 diabetes mellitus ([place]) 10/20/2017   GERD (gastroesophageal reflux disease) 10/20/2017   Obesity (BMI 30.0-34.9) 10/20/2017   Hypothyroidism 01/16/2017   Type 2 diabetes mellitus ([place]) 01/16/2017   Vitamin D deficiency 01/16/2017   Postmenopausal 01/16/2017   Hot flashes due to menopause 01/16/2017   Vitamin B12 deficiency 01/16/2017   Peripheral neuropathy 01/16/2017    Past Surgical History:  Procedure Laterality Date   CESAREAN SECTION  1993   cystoscopies     multiple   FUNCTIONAL ENDOSCOPIC SINUS SURGERY  2003   [place]   TUBAL LIGATION     TYMPANOPLASTY W/ MASTOIDECTOMY Left 2016 & 2017     OB History   No obstetric history on file.     Family History  Problem Relation Age of Onset   Suicidality Mother    Depression Mother    Alcohol abuse Father    Kidney disease Father    Suicidality Father    Depression Father    Breast cancer Maternal Aunt    Heart disease Maternal Grandmother    Breast cancer Maternal Grandmother    Suicidality Maternal Grandfather    Stroke Paternal Grandmother    Heart disease Paternal Grandmother    Heart disease Paternal Grandfather    Esophageal cancer Maternal Uncle    Colon cancer Neg Hx  Pancreatic cancer Neg Hx    Stomach cancer Neg Hx    Liver disease Neg Hx     Social History   Tobacco Use   Smoking status: Former    Packs/day: 0.50    Years: 30.00    Pack years: 15.00    Types: Cigarettes    Quit date: 08/24/2017    Years since quitting: 3.4   Smokeless tobacco: Never  Vaping Use   Vaping Use: Never used  Substance Use Topics   Alcohol use: No   Drug use: No    Home Medications Prior to Admission medications   Medication Sig Start Date End Date Taking? Authorizing Provider  ondansetron (ZOFRAN-ODT) 4 MG disintegrating tablet Take 1 tablet (4 mg total) by mouth every 8 (eight) hours as needed for nausea or vomiting. 01/18/21  Yes  Davonna Belling, MD  albuterol (PROVENTIL HFA;VENTOLIN HFA) 108 (90 Base) MCG/ACT inhaler 2 inhalations  15-20  minutes apart every 4 hours as needed to rescue Asthma Patient taking differently: Inhale 2 puffs into the lungs every 4 (four) hours as needed (asthma). Inhale 15-20  minutes apart 07/29/18   Unk Pinto, MD  albuterol (PROVENTIL) (2.5 MG/3ML) 0.083% nebulizer solution Take 3 mLs (2.5 mg total) by nebulization every 6 (six) hours as needed for wheezing or shortness of breath. 12/05/17   Annita Brod, MD  aspirin EC 81 MG tablet Take 81 mg by mouth daily.    [provider]  blood glucose meter kit and supplies KIT Dispense based on patient and insurance preference. Use up to four times daily as directed. (FOR ICD-9 250.00, 250.01). 12/05/17   Annita Brod, MD  butalbital-acetaminophen-caffeine [facility]) [phone] MG tablet Take 0.5 tablets by mouth every 4 (four) hours as needed for headache. 08/09/19   Liane Comber, NP  cetirizine (ZYRTEC) 10 MG tablet Take 10 mg by mouth daily.    [provider]  Cholecalciferol 1000 units capsule Take 5,000 Units by mouth 2 (two) times daily.     [provider]  Cinnamon 500 MG TABS Take 2 capsules by mouth 2 (two) times daily.    [provider]  Cyanocobalamin (VITAMIN B-12 SL) Place 1 tablet under the tongue daily.    [provider]  cyclobenzaprine (FLEXERIL) 5 MG tablet Take 1 tablet (5 mg total) by mouth 3 (three) times daily as needed for muscle spasms. 05/31/20   Liane Comber, NP  fenofibrate (TRICOR) 145 MG tablet TAKE 1 TABLET DAILY FOR TRIGLYCERIDES (BLOOD FATS) 06/18/20   Unk Pinto, MD  furosemide (LASIX) 40 MG tablet Take 0.5-1 tablets (20-40 mg total) by mouth daily as needed. NEED OV. 02/10/20   Lelon Perla, MD  glucose blood test strip Test blood sugar once daily 08/09/19   Liane Comber, NP  JARDIANCE 25 MG TABS tablet TAKE 1 TABLET BY MOUTH DAILY FOR  DIABETES 10/20/20   Liane Comber, NP  levothyroxine (SYNTHROID) 125 MCG tablet TAKE 1 TAB DAILY ON AN EMPTY STOMACH WITH ONLY WATER FOR 30 MINUTES & NO ANTACID MEDS, CALCIUM OR MAGNESIUM FOR 4 HOURS & AVOID BIOTIN 05/11/20   Garnet Sierras, NP  Magnesium 250 MG TABS Take 250 mg by mouth daily.    [provider]  meloxicam (MOBIC) 15 MG tablet TAKE 1/2 TO 1 TABLET DAILY WITH FOOD FOR PAIN & INFLAMMATION 09/14/20   Liane Comber, NP  metFORMIN (GLUCOPHAGE-XR) 500 MG 24 hr tablet TAKE 2 TABLETS 2 X /DAY WITH MEALS FOR DIABETES 10/20/20  Judd Gaudier, NP  montelukast (SINGULAIR) 10 MG tablet Take  1 tablet  Daily  for Allergies 12/09/20   [name], MD  omeprazole (PRILOSEC) 40 MG capsule TAKE 1 CAPSULE BY MOUTH EVERY DAY 09/15/20   Sherrilyn Rist, MD  ondansetron (ZOFRAN) 4 MG tablet Take 1 tablet (4 mg total) by mouth every 6 (six) hours as needed for nausea. 11/05/19   Esterwood, Amy S, PA-C  potassium chloride (KLOR-CON) 10 MEQ tablet TAKE 1 TABLET BY MOUTH EVERY DAY 12/23/19   Judd Gaudier, NP  pravastatin (PRAVACHOL) 20 MG tablet Take 1 tablet (20 mg total) by mouth at bedtime. 05/11/20   Elder Negus, NP  pregabalin (LYRICA) 200 MG capsule Take  1 capsule  2 to 3 x /day for Painful Neuropathy (Dx: e11.40) 10/31/20   [name], MD  PREMPRO 0.45-1.5 MG tablet TAKE 1 TABLET BY MOUTH EVERY DAY 10/20/20   Judd Gaudier, NP  traMADol (ULTRAM) 50 MG tablet Take     1 to 2 tablets      every 4 hours if needed    for Severe Pain 12/14/20   [name], MD  traZODone (DESYREL) 150 MG tablet TAKE 1/2 TO 1 TABLET 1 HOUR BEFORE BEDTIME IF NEEDED FOR SLEEP (G47.00) 01/14/21   [name], MD  valACYclovir (VALTREX) 1000 MG tablet Take 1 tablet 3 x /day for Fever Blisters / Cold Sores Patient taking differently: daily as needed. Take 1 tablet 3 x /day for Fever Blisters / Cold Sores 10/17/19   [name], MD  VASCEPA 1 g capsule TAKE 1 CAPSULE (1 G TOTAL) BY MOUTH 2  (TWO) TIMES DAILY. 10/17/20   Elder Negus, NP    Allergies    Tetanus toxoids and Sulfa antibiotics  Review of Systems   Review of Systems  Constitutional:  Positive for appetite change.  HENT:  Negative for congestion.   Respiratory:  Positive for shortness of breath.   Cardiovascular:  Positive for chest pain and palpitations.  Gastrointestinal:  Positive for diarrhea, nausea and vomiting. Negative for abdominal pain.  Genitourinary:  Negative for flank pain and frequency.  Musculoskeletal:  Negative for back pain.  Neurological:  Negative for weakness.  Psychiatric/Behavioral:  Negative for confusion. The patient is nervous/anxious.    Physical Exam Updated Vital Signs BP 104/63   Pulse 85   Temp 98 F (36.7 C) (Oral)   Resp (!) 21   SpO2 100%   Physical Exam Vitals and nursing note reviewed.  HENT:     Head: Atraumatic.  Eyes:     Pupils: Pupils are equal, round, and reactive to light.  Cardiovascular:     Rate and Rhythm: Normal rate and regular rhythm.  Pulmonary:     Breath sounds: No wheezing or rhonchi.  Abdominal:     Tenderness: There is no abdominal tenderness.  Musculoskeletal:        General: No tenderness.     Cervical back: Neck supple.  Skin:    General: Skin is warm.     Capillary Refill: Capillary refill takes less than 2 seconds.  Neurological:     Mental Status: She is alert and oriented to person, place, and time.  Psychiatric:        Mood and Affect: Mood normal.    ED Results / Procedures / Treatments   Labs (all labs ordered are listed, but only abnormal results are displayed) Labs Reviewed  BASIC METABOLIC PANEL - Abnormal; Notable for the following components:  Result Value   Calcium 10.7 (*)    All other components within normal limits  HEPATIC FUNCTION PANEL - Abnormal; Notable for the following components:   AST 52 (*)    ALT 54 (*)    All other components within normal limits  CBC  TROPONIN I (HIGH SENSITIVITY)   TROPONIN I (HIGH SENSITIVITY)    EKG EKG Interpretation  Date/Time:  Thursday January 18 2021 11:14:35 EDT Ventricular Rate:  89 PR Interval:  160 QRS Duration: 74 QT Interval:  374 QTC Calculation: 455 R Axis:   8 Text Interpretation: Normal sinus rhythm Low voltage QRS Cannot rule out Anterior infarct , age undetermined Abnormal ECG No significant change since last tracing Confirmed by Davonna Belling [phone]) on 01/18/2021 1:46:39 PM  Radiology DG Chest 2 View  Result Date: 01/18/2021 CLINICAL DATA:  Chest pain and palpitation EXAM: CHEST - 2 VIEW COMPARISON:  05/31/2020 FINDINGS: Vague nodular density over the left upper chest. Clothing artifact is seen throughout the image. There is no edema, consolidation, effusion, or pneumothorax. Normal heart size and mediastinal contours. IMPRESSION: 1. Vague nodular density at the left apex, very likely to be artifact. Recommend repeat two-view radiograph after convalescence. 2. Otherwise negative. Electronically Signed   By: Monte Fantasia M.D.   On: 01/18/2021 12:06    Procedures Procedures   Medications Ordered in ED Medications  sodium chloride 0.9 % bolus 1,000 mL (0 mLs Intravenous Stopped 01/18/21 1553)  ondansetron (ZOFRAN) injection 4 mg (4 mg Intravenous Given 01/18/21 1425)    ED Course  I have reviewed the triage vital signs and the nursing notes.  Pertinent labs & imaging results that were available during my care of the patient were reviewed by me and considered in my medical decision making (see chart for details).    MDM Rules/Calculators/A&P                         Patient with heart palpitations.  Nausea vomiting diarrhea.  Lab work-up overall reassuring.  Feels better after IV fluids.  No arrhythmia here.  Doubt cardiac cause of the chest pain.  Doubt pulmonary embolism.  There was a possible irregularity on chest x-ray there is likely artifact but informed patient and husband about it and will follow up with outpatient.   Discharge home with antiemetic.  Final Clinical Impression(s) / ED Diagnoses Final diagnoses:  Heart palpitations  Nausea vomiting and diarrhea    Rx / DC Orders ED Discharge Orders          Ordered    ondansetron (ZOFRAN-ODT) 4 MG disintegrating tablet  Every 8 hours PRN        01/18/21 1557             Davonna Belling, MD 01/18/21 1622

## 2021-01-18 NOTE — ED Triage Notes (Signed)
Patient here for evaluation of chest palpitations and chest pain that started today and nausea and vomiting that started two days ago. Patient alert, oriented ,and in no apparent distress at this time.

## 2021-01-19 ENCOUNTER — Other Ambulatory Visit: Payer: Self-pay | Admitting: Adult Health

## 2021-02-26 ENCOUNTER — Encounter: Payer: Self-pay | Admitting: Internal Medicine

## 2021-02-26 ENCOUNTER — Ambulatory Visit (INDEPENDENT_AMBULATORY_CARE_PROVIDER_SITE_OTHER): Payer: Managed Care, Other (non HMO) | Admitting: Internal Medicine

## 2021-02-26 ENCOUNTER — Other Ambulatory Visit: Payer: Self-pay

## 2021-02-26 VITALS — BP 98/64 | HR 76 | Temp 97.1°F | Resp 16 | Ht 63.0 in | Wt 151.2 lb

## 2021-02-26 DIAGNOSIS — E559 Vitamin D deficiency, unspecified: Secondary | ICD-10-CM

## 2021-02-26 DIAGNOSIS — Z79899 Other long term (current) drug therapy: Secondary | ICD-10-CM | POA: Diagnosis not present

## 2021-02-26 DIAGNOSIS — R0989 Other specified symptoms and signs involving the circulatory and respiratory systems: Secondary | ICD-10-CM

## 2021-02-26 DIAGNOSIS — E1129 Type 2 diabetes mellitus with other diabetic kidney complication: Secondary | ICD-10-CM

## 2021-02-26 DIAGNOSIS — E785 Hyperlipidemia, unspecified: Secondary | ICD-10-CM

## 2021-02-26 DIAGNOSIS — E1169 Type 2 diabetes mellitus with other specified complication: Secondary | ICD-10-CM

## 2021-02-26 DIAGNOSIS — E1165 Type 2 diabetes mellitus with hyperglycemia: Secondary | ICD-10-CM

## 2021-02-26 DIAGNOSIS — E66812 Obesity, class 2: Secondary | ICD-10-CM

## 2021-02-26 DIAGNOSIS — E039 Hypothyroidism, unspecified: Secondary | ICD-10-CM

## 2021-02-26 MED ORDER — PHENTERMINE HCL 37.5 MG PO TABS
ORAL_TABLET | ORAL | 1 refills | Status: DC
Start: 2021-02-26 — End: 2021-07-18

## 2021-02-26 MED ORDER — TOPIRAMATE 50 MG PO TABS: ORAL_TABLET | ORAL | 1 refills | Status: DC

## 2021-02-26 NOTE — Progress Notes (Signed)
Future Appointments  Date Time Provider Department Center  02/26/2021  4:30 PM [name], MD [facility] None  05/16/2021  4:00 PM Judd Gaudier, NP [facility] None  11/07/2021 10:00 AM [name], MD [facility] None    History of Present Illness:       This very nice 55 y.o. MWF presents for 3 month follow up with HTN, HLD, T2_NIDDM and Vitamin D Deficiency.   In Dec [address] patient was hospitalized with Covid - never ventilated - but post hospital treated with steroids gained a lot of weight.  Has been on Metformin & Jardiance and 2&1/2 month ago had Ozempic 1 mg /week added and she has lost ~ 24 # from 175# down to current 151#.  She desires to re-add Phentermine & is agreeable adding Topiramate to achieve her goal of ~ 135# and hopefully coming off of Diabetic medications.        Patient is followed expectantly for labile HTN & BP has been controlled at home. Today's BP: 98/64. Patient has had no complaints of any cardiac type chest pain, palpitations, dyspnea / orthopnea / PND, dizziness, claudication, or dependent edema.       Hyperlipidemia is controlled with diet & meds. Patient denies myalgias or other med SE's. Last Lipids were at goal except very elevated Trig's:  Lab Results  Component Value Date   CHOL 126 10/31/2020   HDL 30 (L) 10/31/2020   LDLCALC not calculated 10/31/2020   TRIG 709 (H) 10/31/2020   CHOLHDL 4.2 10/31/2020     Also, the patient has history of T2_NIDDM PreDiabetes and has had no symptoms of reactive hypoglycemia, diabetic polys, paresthesias or visual blurring.  Last A1c was not at goal:  Lab Results  Component Value Date   HGBA1C 7.6 (H) 10/31/2020        Further, the patient also has history of Vitamin D Deficiency and supplements vitamin D without any suspected side-effects. Last vitamin D was low (goal 70-100) :   Lab Results  Component Value Date   VD25OH 40 10/31/2020     Current Outpatient Medications on File  Prior to Visit  Medication Sig   albuterol HFA  inhaler 2 inhalations  every 4 hours as needed to rescue Asthma    albuterol (2.5 MG/3ML) neb soln Take 3 mLs  by neb every 6 hours as needed    aspirin EC 81 MG tablet Take daily.   FIORICET Take 0.5 tablets every 4 hours as needed for headache.   cetirizine  10 MG tablet Take 1 daily.   Cholecalciferol 1000 units capsule Take 5,000 Units 2 times daily.    Cinnamon 500 MG TABS Take 2 capsules 2 times daily.   VITAMIN B-12 SL Place 1 tablet under the tongue daily.   cyclobenzaprine 5 MG tablet Take 1 tablet 3  times daily as needed    fenofibrate  145 MG tablet TAKE 1 TABLET DAILY    furosemide 40 MG tablet Take 0.5-1 tablets daily as needed.    JARDIANCE 25 MG TABS TAKE 1 TABLET DAILY    levothyroxine 125 MCG tablet TAKE 1 TAB DAILY    Magnesium 250 MG TABS Take 250 mg by mouth daily.   meloxicam  15 MG tablet TAKE 1/2 TO 1 TABLET DAILY    metFORMIN -XR 500 MG TAKE 2 TABLETS 2 X /DAY    montelukast 10 MG tablet Take  1 tablet  Daily  for Allergies   omeprazole  40 MG capsule TAKE 1 CAPSULE EVERY DAY   ondansetron 4 MG tablet Take 1 tablet every 6 hours as needed for nausea.   ondansetron-ODT 4 MG disintegrating tablet Take 1 tablet every 8 hours as needed for nausea    KLOR-CON  10 MEQ tablet TAKE 1 TABLET EVERY DAY   pravastatin20 MG tablet Take 1 tablet (20 mg total) by mouth at bedtime.   pregabalin  200 MG capsule Take  1 capsule  2 to 3 x /day (Dx: e11.40)   PREMPRO 0.45-1.5 MG tablet TAKE 1 TABLET EVERY DAY   traMADol  50 MG tablet Take     1 to 2 tablets      every 4 hours if needed       traZODone  150 MG tablet TAKE 1/2 TO 1 TAB  1 HR BEFORE BEDTIME    valACYclovir  1000 MG tablet taking as needed 1 tablet 3 x /day    VASCEPA 1 g capsule TAKE 1 CAPSULE 2  TIMES DAILY.     Allergies  Allergen Reactions   Tetanus Toxoids Other (See Comments)    Injection site abcess   Sulfa Antibiotics Hives    itching    PMHx:   Past  Medical History:  Diagnosis Date   COVID-19    Diabetes mellitus without complication (HCC)    Diverticulitis    GERD (gastroesophageal reflux disease)    Hyperlipidemia    IBS (irritable bowel syndrome)    Insomnia 12/10/2019   Migraine    Pneumonia 11/2017   Thyroid disease    UTI (urinary tract infection)      Immunization History  Administered Date(s) Administered   Influenza Inj Mdck Quad With Preservative 07/22/2017, 05/14/2018   PFIZER Comirnaty(Gray Top)Covid-19 Tri-Sucrose Vaccine 09/16/2020   PFIZER(Purple Top)SARS-COV-2 Vaccination 12/30/2019, 01/27/2020   Pneumococcal Polysaccharide-23 08/20/2018     Past Surgical History:  Procedure Laterality Date   CESAREAN SECTION  1993   cystoscopies     multiple   FUNCTIONAL ENDOSCOPIC SINUS SURGERY  2003   LAPAROSCOPIC OVARIAN CYSTECTOMY  1983   TONSILLECTOMY  1973   TUBAL LIGATION     TYMPANOPLASTY W/ MASTOIDECTOMY Left 2016 & 2017    FHx:    Reviewed / unchanged  SHx:    Reviewed / unchanged   Systems Review:  Constitutional: Denies fever, chills, wt changes, headaches, insomnia, fatigue, night sweats, change in appetite. Eyes: Denies redness, blurred vision, diplopia, discharge, itchy, watery eyes.  ENT: Denies discharge, congestion, post nasal drip, epistaxis, sore throat, earache, hearing loss, dental pain, tinnitus, vertigo, sinus pain, snoring.  CV: Denies chest pain, palpitations, irregular heartbeat, syncope, dyspnea, diaphoresis, orthopnea, PND, claudication or edema. Respiratory: denies cough, dyspnea, DOE, pleurisy, hoarseness, laryngitis, wheezing.  Gastrointestinal: Denies dysphagia, odynophagia, heartburn, reflux, water brash, abdominal pain or cramps, nausea, vomiting, bloating, diarrhea, constipation, hematemesis, melena, hematochezia  or hemorrhoids. Genitourinary: Denies dysuria, frequency, urgency, nocturia, hesitancy, discharge, hematuria or flank pain. Musculoskeletal: Denies arthralgias,  myalgias, stiffness, jt. swelling, pain, limping or strain/sprain.  Skin: Denies pruritus, rash, hives, warts, acne, eczema or change in skin lesion(s). Neuro: No weakness, tremor, incoordination, spasms, paresthesia or pain. Psychiatric: Denies confusion, memory loss or sensory loss. Endo: Denies change in weight, skin or hair change.  Heme/Lymph: No excessive bleeding, bruising or enlarged lymph nodes.  Physical Exam  BP 98/64   Pulse 76 Comment: 151.2#  Temp (!) 97.1 F (36.2 C)   Resp 16   Ht 5\' 3"  (1.6 m)  Wt 151 lb 3.2 oz (68.6 kg) Comment: 24# weight loss  SpO2 95%   BMI 26.78 kg/m   Appears  well nourished, well groomed  and in no distress.  Eyes: PERRLA, EOMs, conjunctiva no swelling or erythema. Sinuses: No frontal/maxillary tenderness ENT/Mouth: EAC's clear, TM's nl w/o erythema, bulging. Nares clear w/o erythema, swelling, exudates. Oropharynx clear without erythema or exudates. Oral hygiene is good. Tongue normal, non obstructing. Hearing intact.  Neck: Supple. Thyroid not palpable. Car 2+/2+ without bruits, nodes or JVD. Chest: Respirations nl with BS clear & equal w/o rales, rhonchi, wheezing or stridor.  Cor: Heart sounds normal w/ regular rate and rhythm without sig. murmurs, gallops, clicks or rubs. Peripheral pulses normal and equal  without edema.  Abdomen: Soft & bowel sounds normal. Non-tender w/o guarding, rebound, hernias, masses or organomegaly.  Lymphatics: Unremarkable.  Musculoskeletal: Full ROM all peripheral extremities, joint stability, 5/5 strength and normal gait.  Skin: Warm, dry without exposed rashes, lesions or ecchymosis apparent.  Neuro: Cranial nerves intact, reflexes equal bilaterally. Sensory-motor testing grossly intact. Tendon reflexes grossly intact.  Pysch: Alert & oriented x 3.  Insight and judgement nl & appropriate. No ideations.  Assessment and Plan:  1. Labile hypertension  - Continue medication, monitor blood pressure at  home.  - Continue DASH diet.  Reminder to go to the ER if any CP,  SOB, nausea, dizziness, severe HA, changes vision/speech.   - CBC with Differential/Platelet - COMPLETE METABOLIC PANEL WITH GFR - Magnesium - TSH  2. 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  - Continue monitor periodic cholesterol/liver & renal functions    - Lipid panel - TSH - Hemoglobin A1c  3. Hyperlipidemia associated with type 2 diabetes mellitus (HCC)  - Continue diet, exercise  - Lifestyle modifications.  - Monitor appropriate labs   - Insulin, random - Hemoglobin A1c  4. Vitamin D deficiency  - Continue supplementation   - VITAMIN D 25 Hydroxy   5. Hypothyroidism, unspecified type  - TSH  6. Class 2 severe obesity due to excess calories with serious comorbidity in adult, unspecified BMI (HCC)  - phentermine (ADIPEX-P) 37.5 MG tablet; Take 1/2 to 1 tablet every Morning for Dieting & Weight Loss  Dispense: 90 tablet; Refill: 1 - topiramate (TOPAMAX) 50 MG tablet; Take 1/2 to 1 tablet 2 x /day at Suppertime & Bedtime for Dieting & Weight Loss  Dispense: 180 tablet; Refill: 1  7. Medication management  - CBC with Differential/Platelet - COMPLETE METABOLIC PANEL WITH GFR - Magnesium - Lipid panel - TSH - Insulin, random - VITAMIN D 25 Hydroxy - Hemoglobin A1c        Discussed  regular exercise, BP monitoring, weight control to achieve/maintain BMI less than 25 and discussed med and SE's. Recommended labs to assess and monitor clinical status with further disposition pending results of labs.  I discussed the assessment and treatment plan with the patient. The patient was provided an opportunity to ask questions and all were answered. The patient agreed with the plan and demonstrated an understanding of the instructions.  I provided over 30 minutes of exam, counseling, chart review and  complex critical  decision making.        The patient was advised to call back or seek an in-person evaluation if the symptoms worsen or if the condition fails to improve as anticipated.   Marinus Maw, MD

## 2021-02-27 ENCOUNTER — Other Ambulatory Visit: Payer: Self-pay | Admitting: Internal Medicine

## 2021-02-27 LAB — CBC WITH DIFFERENTIAL/PLATELET
Absolute Monocytes: 498 cells/uL (ref 200–950)
Basophils Absolute: 50 cells/uL (ref 0–200)
Basophils Relative: 0.8 %
Eosinophils Absolute: 82 cells/uL (ref 15–500)
Eosinophils Relative: 1.3 %
HCT: 42.4 % (ref 35.0–45.0)
Hemoglobin: 14.2 g/dL (ref 11.7–15.5)
Lymphs Abs: 2457 cells/uL (ref 850–3900)
MCH: 30.2 pg (ref 27.0–33.0)
MCHC: 33.5 g/dL (ref 32.0–36.0)
MCV: 90.2 fL (ref 80.0–100.0)
MPV: 11 fL (ref 7.5–12.5)
Monocytes Relative: 7.9 %
Neutro Abs: 3213 cells/uL (ref 1500–7800)
Neutrophils Relative %: 51 %
Platelets: 334 10*3/uL (ref 140–400)
RBC: 4.7 10*6/uL (ref 3.80–5.10)
RDW: 14.2 % (ref 11.0–15.0)
Total Lymphocyte: 39 %
WBC: 6.3 10*3/uL (ref 3.8–10.8)

## 2021-02-27 LAB — LIPID PANEL
Cholesterol: 129 mg/dL (ref <200)
HDL: 45 mg/dL — ABNORMAL LOW (ref >=50)
LDL Cholesterol (Calc): 54 mg/dL (calc)
Non-HDL Cholesterol (Calc): 84 mg/dL (calc) (ref <130)
Total CHOL/HDL Ratio: 2.9 (calc) (ref <5.0)
Triglycerides: 242 mg/dL — ABNORMAL HIGH (ref <150)

## 2021-02-27 LAB — COMPLETE METABOLIC PANEL WITH GFR
AG Ratio: 1.8 (calc) (ref 1.0–2.5)
ALT: 61 U/L — ABNORMAL HIGH (ref 6–29)
AST: 56 U/L — ABNORMAL HIGH (ref 10–35)
Albumin: 4.6 g/dL (ref 3.6–5.1)
Alkaline phosphatase (APISO): 63 U/L (ref 37–153)
BUN: 21 mg/dL (ref 7–25)
CO2: 26 mmol/L (ref 20–32)
Calcium: 11.3 mg/dL — ABNORMAL HIGH (ref 8.6–10.4)
Chloride: 103 mmol/L (ref 98–110)
Creat: 0.55 mg/dL (ref 0.50–1.03)
Globulin: 2.5 g/dL (calc) (ref 1.9–3.7)
Glucose, Bld: 89 mg/dL (ref 65–99)
Potassium: 4.3 mmol/L (ref 3.5–5.3)
Sodium: 139 mmol/L (ref 135–146)
Total Bilirubin: 0.5 mg/dL (ref 0.2–1.2)
Total Protein: 7.1 g/dL (ref 6.1–8.1)
eGFR: 110 mL/min/{1.73_m2} (ref >=60)

## 2021-02-27 LAB — TSH: TSH: 0.1 mIU/L — ABNORMAL LOW

## 2021-02-27 LAB — VITAMIN D 25 HYDROXY (VIT D DEFICIENCY, FRACTURES): Vit D, 25-Hydroxy: 79 ng/mL (ref 30–100)

## 2021-02-27 LAB — INSULIN, RANDOM: Insulin: 22.9 u[IU]/mL — ABNORMAL HIGH

## 2021-02-27 LAB — HEMOGLOBIN A1C
Hgb A1c MFr Bld: 5.4 % of total Hgb (ref <5.7)
Mean Plasma Glucose: 108 mg/dL
eAG (mmol/L): 6 mmol/L

## 2021-02-27 LAB — MAGNESIUM: Magnesium: 1.8 mg/dL (ref 1.5–2.5)

## 2021-02-27 NOTE — Progress Notes (Signed)
============================================================ - Test results slightly outside the reference range are not unusual. If there is anything important, I will review this with you,  otherwise it is considered normal test values.  If you have further questions,  please do not hesitate to contact me at the office or via My Chart.  ============================================================ ============================================================ ============================================================ - Test results slightly outside the reference range are not unusual. If there is anything important, I will review this with you,  otherwise it is considered normal test values.  If you have further questions,  please do not hesitate to contact me at the office or via My Chart.  ============================================================ ============================================================  - Calcium is a little elevated, So we'll need to recheck in about a month   - Call office to schedule a blood test  about the middle of August                              (Calcium & PTH level in a month) ============================================================ ============================================================  - Total Chol = 129    7   LDL Chol  = 54   - Both  Excellent   - Very low risk for Heart Attack  / Stroke ============================================================ ============================================================  - But Triglycerides (   242  ) or fats in blood are too high  (goal is less than 150)    - Recommend avoid fried & greasy foods,  sweets / candy,   - Avoid white rice  (brown or wild rice or Quinoa is OK),   - Avoid white potatoes  (sweet potatoes are OK)   - Avoid anything made from white flour  - bagels, doughnuts, rolls, buns, biscuits, white and   wheat breads, pizza crust and traditional  pasta made of white  flour & egg white  - (vegetarian pasta or spinach or wheat pasta is OK).    - Multi-grain bread is OK - like multi-grain flat bread or  sandwich thins.   - Avoid alcohol in excess.   - Exercise is also important. ============================================================ ============================================================  - TSH is low which implies Thyroid hormone is slightly elevated in Blood, But . . .  May be a lab abnormality if taking supplements that interfere with   measurements of TSH in Blood - So please let me know if taking                                                                               supplements with Biotin in it ! ============================================================ ============================================================  - Vitamin D = 79 = Excellent  ============================================================ ============================================================  - A1c - is down to 5.4% - Excellent , So OK to stop the Metformin &  Monitor your blood sugars                                                     ============================================================ ============================================================  - Keep up the   [place] Work    with  dieting and weight loss !  ============================================================ ============================================================

## 2021-03-10 ENCOUNTER — Other Ambulatory Visit: Payer: Self-pay | Admitting: Adult Health Nurse Practitioner

## 2021-03-17 ENCOUNTER — Other Ambulatory Visit: Payer: Self-pay | Admitting: Internal Medicine

## 2021-03-17 DIAGNOSIS — G4700 Insomnia, unspecified: Secondary | ICD-10-CM

## 2021-04-15 ENCOUNTER — Other Ambulatory Visit: Payer: Self-pay | Admitting: Adult Health Nurse Practitioner

## 2021-04-18 ENCOUNTER — Other Ambulatory Visit: Payer: Self-pay | Admitting: Adult Health

## 2021-04-26 ENCOUNTER — Ambulatory Visit (INDEPENDENT_AMBULATORY_CARE_PROVIDER_SITE_OTHER): Payer: Managed Care, Other (non HMO) | Admitting: Adult Health

## 2021-04-26 ENCOUNTER — Encounter: Payer: Self-pay | Admitting: Adult Health

## 2021-04-26 ENCOUNTER — Other Ambulatory Visit: Payer: Self-pay

## 2021-04-26 VITALS — BP 100/60 | HR 87 | Temp 97.9°F | Resp 16 | Ht 63.0 in | Wt 145.4 lb

## 2021-04-26 DIAGNOSIS — R300 Dysuria: Secondary | ICD-10-CM | POA: Diagnosis not present

## 2021-04-26 MED ORDER — PHENAZOPYRIDINE HCL 200 MG PO TABS: ORAL_TABLET | ORAL | 0 refills | Status: AC

## 2021-04-26 MED ORDER — NITROFURANTOIN MONOHYD MACRO 100 MG PO CAPS: ORAL_CAPSULE | ORAL | 0 refills | Status: DC

## 2021-04-26 NOTE — Patient Instructions (Signed)
High-Fiber Eating Plan °Fiber, also called dietary fiber, is a type of carbohydrate. It is found foods such as fruits, vegetables, whole grains, and beans. A high-fiber diet can have many health benefits. Your health care provider may recommend a high-fiber diet to help: °Prevent constipation. Fiber can make your bowel movements more regular. °Lower your cholesterol. °Relieve the following conditions: °Inflammation of veins in the anus (hemorrhoids). °Inflammation of specific areas of the digestive tract (uncomplicated diverticulosis). °A problem of the large intestine, also called the colon, that sometimes causes pain and diarrhea (irritable bowel syndrome, or IBS). °Prevent overeating as part of a weight-loss plan. °Prevent heart disease, type 2 diabetes, and certain cancers. °What are tips for following this plan? °Reading food labels ° °Check the nutrition facts label on food products for the amount of dietary fiber. Choose foods that have 5 grams of fiber or more per serving. °The goals for recommended daily fiber intake include: °Men (age 50 or younger): 34-38 g. °Men (over age 50): 28-34 g. °Women (age 50 or younger): 25-28 g. °Women (over age 50): 22-25 g. °Your daily fiber goal is _____________ g. °Shopping °Choose whole fruits and vegetables instead of processed forms, such as apple juice or applesauce. °Choose a wide variety of high-fiber foods such as avocados, lentils, oats, and kidney beans. °Read the nutrition facts label of the foods you choose. Be aware of foods with added fiber. These foods often have high sugar and sodium amounts per serving. °Cooking °Use whole-grain flour for baking and cooking. °Cook with brown rice instead of white rice. °Meal planning °Start the day with a breakfast that is high in fiber, such as a cereal that contains 5 g of fiber or more per serving. °Eat breads and cereals that are made with whole-grain flour instead of refined flour or white flour. °Eat brown rice, bulgur  wheat, or millet instead of white rice. °Use beans in place of meat in soups, salads, and pasta dishes. °Be sure that half of the grains you eat each day are whole grains. °General information °You can get the recommended daily intake of dietary fiber by: °Eating a variety of fruits, vegetables, grains, nuts, and beans. °Taking a fiber supplement if you are not able to take in enough fiber in your diet. It is better to get fiber through food than from a supplement. °Gradually increase how much fiber you consume. If you increase your intake of dietary fiber too quickly, you may have bloating, cramping, or gas. °Drink plenty of water to help you digest fiber. °Choose high-fiber snacks, such as berries, raw vegetables, nuts, and popcorn. °What foods should I eat? °Fruits °Berries. Pears. Apples. Oranges. Avocado. Prunes and raisins. Dried figs. °Vegetables °Sweet potatoes. Spinach. Kale. Artichokes. Cabbage. Broccoli. Cauliflower. Green peas. Carrots. Squash. °Grains °Whole-grain breads. Multigrain cereal. Oats and oatmeal. Brown rice. Barley. Bulgur wheat. Millet. Quinoa. Bran muffins. Popcorn. Rye wafer crackers. °Meats and other proteins °Navy beans, kidney beans, and pinto beans. Soybeans. Split peas. Lentils. Nuts and seeds. °Dairy °Fiber-fortified yogurt. °Beverages °Fiber-fortified soy milk. Fiber-fortified orange juice. °Other foods °Fiber bars. °The items listed above may not be a complete list of recommended foods and beverages. Contact a dietitian for more information. °What foods should I avoid? °Fruits °Fruit juice. Cooked, strained fruit. °Vegetables °Fried potatoes. Canned vegetables. Well-cooked vegetables. °Grains °White bread. Pasta made with refined flour. White rice. °Meats and other proteins °Fatty cuts of meat. Fried chicken or fried fish. °Dairy °Milk. Yogurt. Cream cheese. Sour cream. °Fats and   oils °Butters. °Beverages °Soft drinks. °Other foods °Cakes and pastries. °The items listed above may  not be a complete list of foods and beverages to avoid. Talk with your dietitian about what choices are best for you. °Summary °Fiber is a type of carbohydrate. It is found in foods such as fruits, vegetables, whole grains, and beans. °A high-fiber diet has many benefits. It can help to prevent constipation, lower blood cholesterol, aid weight loss, and reduce your risk of heart disease, diabetes, and certain cancers. °Increase your intake of fiber gradually. Increasing fiber too quickly may cause cramping, bloating, and gas. Drink plenty of water while you increase the amount of fiber you consume. °The best sources of fiber include whole fruits and vegetables, whole grains, nuts, seeds, and beans. °This information is not intended to replace advice given to you by your health care provider. Make sure you discuss any questions you have with your health care provider. °Document Revised: 12/02/2019 Document Reviewed: 12/02/2019 °[employer]vier Patient Education © 2022 [employer] ° °

## 2021-04-26 NOTE — Progress Notes (Signed)
Assessment and Plan:  Torra was seen today for urinary tract infection.  Diagnoses and all orders for this visit:  Dysuria Presumptively treat due to hx, classic sx, recent azo will likely return negative culture Medications: nitrofurantoin. Maintain adequate hydration. Follow up if symptoms not improving, and as needed. -     Urinalysis, Routine w reflex microscopic -     Urine Culture -     nitrofurantoin, macrocrystal-monohydrate, (MACROBID) 100 MG capsule; Take 1 cap twice a day for 5 days, then 1 cap as needed after intercourse. -     phenazopyridine (PYRIDIUM) 200 MG tablet; Take 1 tab up to three times a day as needed for bladder pain.  Further disposition pending results of labs. Discussed med's effects and SE's.   Over 15 minutes of exam, counseling, chart review, and critical decision making was performed.   Future Appointments  Date Time Provider [place]  05/16/2021  4:00 PM Liane Comber, NP [facility] None  11/07/2021 10:00 AM Unk Pinto, MD [facility] None    ------------------------------------------------------------------------------------------------------------------   HPI BP 100/60   Pulse 87   Temp 97.9 F (36.6 C)   Resp 16   Ht _0  (1.6 m)   Wt 145 lb 6.4 oz (66 kg)   SpO2 96%   BMI 25.76 kg/m  55 y.o.female with hx of IC, has seen urology presents for evaluation of UTI sx.   She reports 3 days ago started having dysuria, urgency, frequency, urine odor and cloudiness. Denies chills/fever or flank pain. Hx of IC reported, also frequent UTI post-coital, was on macrobid prophylaxis post coital at one point but not in some time. Denies vaginal sx or new sexual partner. Was on vacation 2 weeks ago with increased activity and thinks this was trigger. Has been taking azo to manage discomfort.   Hx of T2DM but much improved with recent intentional 30+ lb weight loss, reports oiff of metformin and jardiance, doing ozempic only  Lab Results   Component Value Date   HGBA1C 5.4 02/26/2021     Past Medical History:  Diagnosis Date   COVID-19    Diabetes mellitus without complication (HCC)    Diverticulitis    GERD (gastroesophageal reflux disease)    Hyperlipidemia    IBS (irritable bowel syndrome)    Insomnia 12/10/2019   Migraine    Pneumonia 11/2017   Thyroid disease    UTI (urinary tract infection)      Allergies  Allergen Reactions   Tetanus Toxoids Other (See Comments)    Injection site abcess   Sulfa Antibiotics Hives    itching    Current Outpatient Medications on File Prior to Visit  Medication Sig   albuterol (PROVENTIL HFA;VENTOLIN HFA) 108 (90 Base) MCG/ACT inhaler 2 inhalations  15-20  minutes apart every 4 hours as needed to rescue Asthma (Patient taking differently: Inhale 2 puffs into the lungs every 4 (four) hours as needed (asthma). Inhale 15-20  minutes apart)   albuterol (PROVENTIL) (2.5 MG/3ML) 0.083% nebulizer solution Take 3 mLs (2.5 mg total) by nebulization every 6 (six) hours as needed for wheezing or shortness of breath.   aspirin EC 81 MG tablet Take 81 mg by mouth daily.   blood glucose meter kit and supplies KIT Dispense based on patient and insurance preference. Use up to four times daily as directed. (FOR ICD-9 250.00, 250.01).   butalbital-acetaminophen-caffeine (FIORICET) 50-325-40 MG tablet Take 0.5 tablets by mouth every 4 (four) hours as needed for headache.   Cholecalciferol  1000 units capsule Take 5,000 Units by mouth 2 (two) times daily.    Cyanocobalamin (VITAMIN B-12 SL) Place 1 tablet under the tongue daily.   cyclobenzaprine (FLEXERIL) 5 MG tablet Take 1 tablet (5 mg total) by mouth 3 (three) times daily as needed for muscle spasms.   fenofibrate (TRICOR) 145 MG tablet TAKE 1 TABLET DAILY FOR TRIGLYCERIDES (BLOOD FATS)   furosemide (LASIX) 40 MG tablet Take 0.5-1 tablets (20-40 mg total) by mouth daily as needed. NEED OV.   glucose blood test strip Test blood sugar once  daily   icosapent Ethyl (VASCEPA) 1 g capsule Take  1 capsule 2 x /day for for Triglycerides (Blood Fats)  /      TAKE 1 CAPSULE BY MOUTH 2 TIMES DAILY.   JARDIANCE 25 MG TABS tablet TAKE 1 TABLET BY MOUTH DAILY FOR DIABETES   levothyroxine (SYNTHROID) 125 MCG tablet TAKE 1 TAB DAILY ON AN EMPTY STOMACH WITH ONLY WATER FOR 30 MINUTES & NO ANTACID MEDS, CALCIUM OR MAGNESIUM FOR 4 HOURS & AVOID BIOTIN   Magnesium 250 MG TABS Take 250 mg by mouth daily.   meloxicam (MOBIC) 15 MG tablet TAKE 1/2 TO 1 TABLET DAILY WITH FOOD FOR PAIN & INFLAMMATION   ondansetron (ZOFRAN) 4 MG tablet Take 1 tablet (4 mg total) by mouth every 6 (six) hours as needed for nausea.   ondansetron (ZOFRAN-ODT) 4 MG disintegrating tablet Take 1 tablet (4 mg total) by mouth every 8 (eight) hours as needed for nausea or vomiting.   phentermine (ADIPEX-P) 37.5 MG tablet Take 1/2 to 1 tablet every Morning for Dieting & Weight Loss   potassium chloride (KLOR-CON) 10 MEQ tablet TAKE 1 TABLET BY MOUTH EVERY DAY   pravastatin (PRAVACHOL) 20 MG tablet TAKE 1 TABLET BY MOUTH EVERYDAY AT BEDTIME   pregabalin (LYRICA) 200 MG capsule Take  1 capsule  2 to 3 x /day for Painful Neuropathy (Dx: e11.40)   PREMPRO 0.45-1.5 MG tablet TAKE 1 TABLET BY MOUTH EVERY DAY   topiramate (TOPAMAX) 50 MG tablet Take 1/2 to 1 tablet 2 x /day at Suppertime & Bedtime for Dieting & Weight Loss   traMADol (ULTRAM) 50 MG tablet Take     1 to 2 tablets      every 4 hours if needed    for Severe Pain   traZODone (DESYREL) 150 MG tablet Take 1/2 to 1 tablet 1 hour before Bedtime if needed for Sleep (icd 10  Dx:  g47.00)   valACYclovir (VALTREX) 1000 MG tablet Take 1 tablet 3 x /day for Fever Blisters / Cold Sores (Patient taking differently: daily as needed. Take 1 tablet 3 x /day for Fever Blisters / Cold Sores)   cetirizine (ZYRTEC) 10 MG tablet Take 10 mg by mouth daily. (Patient not taking: Reported on 04/26/2021)   Cinnamon 500 MG TABS Take 2 capsules by mouth 2  (two) times daily. (Patient not taking: Reported on 04/26/2021)   metFORMIN (GLUCOPHAGE-XR) 500 MG 24 hr tablet TAKE 2 TABLETS 2 X /DAY WITH MEALS FOR DIABETES (Patient not taking: Reported on 04/26/2021)   montelukast (SINGULAIR) 10 MG tablet Take  1 tablet  Daily  for Allergies (Patient not taking: Reported on 04/26/2021)   omeprazole (PRILOSEC) 40 MG capsule TAKE 1 CAPSULE BY MOUTH EVERY DAY (Patient not taking: Reported on 04/26/2021)   No current facility-administered medications on file prior to visit.    ROS: all negative except above.   Physical Exam:  BP 100/60  Pulse 87   Temp 97.9 F (36.6 C)   Resp 16   Ht _0  (1.6 m)   Wt 145 lb 6.4 oz (66 kg)   SpO2 96%   BMI 25.76 kg/m   General Appearance: Well nourished, well dressed adult female in no apparent distress. Eyes: conjunctiva no swelling or erythema ENT/Mouth: Mask in place; Hearing normal.  Neck: Supple Respiratory: Respiratory effort normal Cardio: Appears well perfused Abdomen: Soft, + BS.  + mild suprapubic tenderness, not distended, no guarding, rebound, masses. Musculoskeletal: normal gait.  Skin: Warm, dry without rashes, lesions, ecchymosis.  Neuro: Normal muscle tone Psych: Awake and oriented X 3, normal affect, Insight and Judgment appropriate.     [name], NP 10:47 AM [facility]

## 2021-04-27 ENCOUNTER — Other Ambulatory Visit: Payer: Managed Care, Other (non HMO)

## 2021-04-27 ENCOUNTER — Other Ambulatory Visit: Payer: Self-pay | Admitting: Adult Health

## 2021-04-27 MED ORDER — FLUCONAZOLE 150 MG PO TABS: 150.0000 mg | ORAL_TABLET | Freq: Once | ORAL | 3 refills | Status: AC

## 2021-04-29 LAB — URINALYSIS, ROUTINE W REFLEX MICROSCOPIC
Bilirubin Urine: NEGATIVE
Hyaline Cast: NONE SEEN /LPF
Ketones, ur: NEGATIVE
Nitrite: NEGATIVE
Specific Gravity, Urine: 1.029 (ref 1.001–1.035)
WBC, UA: >=60 /HPF — AB (ref 0–5)
pH: <=5 (ref 5.0–8.0)

## 2021-04-29 LAB — URINE CULTURE
MICRO NUMBER:: 12379712
SPECIMEN QUALITY:: ADEQUATE

## 2021-04-29 LAB — MICROSCOPIC MESSAGE

## 2021-05-01 ENCOUNTER — Other Ambulatory Visit: Payer: Self-pay | Admitting: Adult Health

## 2021-05-01 MED ORDER — CEPHALEXIN 250 MG PO CAPS: ORAL_CAPSULE | ORAL | 0 refills | Status: DC

## 2021-05-01 MED ORDER — AMOXICILLIN-POT CLAVULANATE 875-125 MG PO TABS: 1.0000 | ORAL_TABLET | Freq: Two times a day (BID) | ORAL | 0 refills | Status: AC

## 2021-05-08 ENCOUNTER — Ambulatory Visit: Payer: Managed Care, Other (non HMO) | Admitting: Internal Medicine

## 2021-05-08 ENCOUNTER — Other Ambulatory Visit: Payer: Self-pay

## 2021-05-08 ENCOUNTER — Encounter: Payer: Self-pay | Admitting: Internal Medicine

## 2021-05-08 VITALS — BP 103/75 | HR 99 | Temp 97.5°F | Resp 16 | Ht 62.0 in | Wt 145.4 lb

## 2021-05-08 DIAGNOSIS — R319 Hematuria, unspecified: Secondary | ICD-10-CM | POA: Diagnosis not present

## 2021-05-08 DIAGNOSIS — N390 Urinary tract infection, site not specified: Secondary | ICD-10-CM

## 2021-05-08 MED ORDER — LEVOFLOXACIN 500 MG PO TABS: ORAL_TABLET | ORAL | 0 refills | Status: DC

## 2021-05-08 NOTE — Progress Notes (Signed)
Future Appointments  Date Time Provider [place]  05/16/2021 10:30 AM Unk Pinto, MD [facility] None  11/07/2021 10:00 AM Unk Pinto, MD [facility] None    History of Present Illness:     Patient presents with  with hx/o recent Klebsiella UTI treated x 5 days with Augmentin & the day after she finished meds she awoke with intense burning dysuria & blood in Urine.  Patient has hx/o recurrent UTI's - usually post coital for which she's used Keflex apophylactically. Systems review is otherwise negative.   Medications  Outpatient Medications Prior to Visit  Medication Sig   albuterol (PROVENTIL HFA;VENTOLIN HFA) 108 (90 Base) MCG/ACT inhaler 2 inhalations  15-20  minutes apart every 4 hours as needed to rescue Asthma (Patient taking differently: Inhale 2 puffs into the lungs every 4 (four) hours as needed (asthma). Inhale 15-20  minutes apart)   albuterol (PROVENTIL) (2.5 MG/3ML) 0.083% nebulizer solution Take 3 mLs (2.5 mg total) by nebulization every 6 (six) hours as needed for wheezing or shortness of breath.   aspirin EC 81 MG tablet Take  daily.   blood glucose meter kit and supplies KIT Dispense based on patient and insurance preference. Use up to four times daily as directed. (FOR ICD-9 250.00, 250.01).   butalbital-acetaminophen-caffeine (FIORICET) 50-325-40 MG tablet Take 0.5 tablets by mouth every 4 (four) hours as needed for headache.   cephALEXin (KEFLEX) 250 MG capsule Take 1 tab after intercourse as needed to prevent recurrent UTI.   cetirizine (ZYRTEC) 10 MG tablet Take 10 mg by mouth daily. (Patient not taking: Reported on 04/26/2021)   Cholecalciferol 1000 units capsule Take 5,000 Units by mouth 2 (two) times daily.    Cyanocobalamin (VITAMIN B-12 SL) Place 1 tablet under the tongue daily.   cyclobenzaprine (FLEXERIL) 5 MG tablet Take 1 tablet (5 mg total) by mouth 3 (three) times daily as needed for muscle spasms.   fenofibrate (TRICOR) 145 MG tablet  TAKE 1 TABLET DAILY FOR TRIGLYCERIDES (BLOOD FATS)   furosemide (LASIX) 40 MG tablet Take 0.5-1 tablets (20-40 mg total) by mouth daily as needed. NEED OV.   glucose blood test strip Test blood sugar once daily   icosapent Ethyl (VASCEPA) 1 g capsule Take  1 capsule 2 x /day for for Triglycerides (Blood Fats)  /      TAKE 1 CAPSULE BY MOUTH 2 TIMES DAILY.   JARDIANCE 25 MG TABS tablet TAKE 1 TABLET BY MOUTH DAILY FOR DIABETES   levothyroxine (SYNTHROID) 125 MCG tablet TAKE 1 TAB DAILY ON AN EMPTY STOMACH WITH ONLY WATER FOR 30 MINUTES & NO ANTACID MEDS, CALCIUM OR MAGNESIUM FOR 4 HOURS & AVOID BIOTIN   Magnesium 250 MG TABS Take 250 mg by mouth daily.   meloxicam (MOBIC) 15 MG tablet TAKE 1/2 TO 1 TABLET DAILY WITH FOOD FOR PAIN & INFLAMMATION   ondansetron (ZOFRAN) 4 MG tablet Take 1 tablet (4 mg total) by mouth every 6 (six) hours as needed for nausea.   ondansetron (ZOFRAN-ODT) 4 MG disintegrating tablet Take 1 tablet (4 mg total) by mouth every 8 (eight) hours as needed for nausea or vomiting.   phenazopyridine (PYRIDIUM) 200 MG tablet Take 1 tab up to three times a day as needed for bladder pain.   phentermine (ADIPEX-P) 37.5 MG tablet Take 1/2 to 1 tablet every Morning for Dieting & Weight Loss   potassium chloride (KLOR-CON) 10 MEQ tablet TAKE 1 TABLET BY MOUTH EVERY DAY   pravastatin (PRAVACHOL) 20 MG  tablet TAKE 1 TABLET BY MOUTH EVERYDAY AT BEDTIME   pregabalin (LYRICA) 200 MG capsule Take  1 capsule  2 to 3 x /day for Painful Neuropathy (Dx: e11.40)   PREMPRO 0.45-1.5 MG tablet TAKE 1 TABLET BY MOUTH EVERY DAY   topiramate (TOPAMAX) 50 MG tablet Take 1/2 to 1 tablet 2 x /day at Suppertime & Bedtime for Dieting & Weight Loss   traMADol (ULTRAM) 50 MG tablet Take     1 to 2 tablets      every 4 hours if needed    for Severe Pain   traZODone (DESYREL) 150 MG tablet Take 1/2 to 1 tablet 1 hour before Bedtime if needed for Sleep (icd 10  Dx:  g47.00)   valACYclovir (VALTREX) 1000 MG tablet  Take 1 tablet 3 x /day for Fever Blisters / Cold Sores (Patient taking differently: daily as needed. Take 1 tablet 3 x /day for Fever Blisters / Cold Sores)   Cinnamon 500 MG TABS Take 2 capsules by mouth 2 (two) times daily. (Patient not taking: Reported on 04/26/2021)   nitrofurantoin, macrocrystal-monohydrate, (MACROBID) 100 MG capsule Take 1 cap twice a day for 5 days, then 1 cap as needed after intercourse.     Past Medical History:  Diagnosis Date   COVID-19    Diabetes mellitus without complication (HCC)    Diverticulitis    GERD (gastroesophageal reflux disease)    Hyperlipidemia    IBS (irritable bowel syndrome)    Insomnia 12/10/2019   Migraine    Pneumonia 11/2017   Thyroid disease    UTI (urinary tract infection)     Past Surgical History:  Procedure Laterality Date   CESAREAN SECTION  1993   cystoscopies     multiple   FUNCTIONAL ENDOSCOPIC SINUS SURGERY  2003   LAPAROSCOPIC OVARIAN CYSTECTOMY  1983   TONSILLECTOMY  1973   TUBAL LIGATION     TYMPANOPLASTY W/ MASTOIDECTOMY Left 2016 & 2017    Family History  Problem Relation Age of Onset   Suicidality Mother    Depression Mother    Alcohol abuse Father    Kidney disease Father    Suicidality Father    Depression Father    Breast cancer Maternal Aunt    Heart disease Maternal Grandmother    Breast cancer Maternal Grandmother    Suicidality Maternal Grandfather    Stroke Paternal Grandmother    Heart disease Paternal Grandmother    Heart disease Paternal Grandfather    Esophageal cancer Maternal Uncle    Colon cancer Neg Hx    Pancreatic cancer Neg Hx    Stomach cancer Neg Hx    Liver disease Neg Hx     Social History   Socioeconomic History   Marital status: Married    Spouse name: Not on file   Number of children: Not on file  Occupational History    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 [employer]  Tobacco Use   Smoking status: Former    Packs/day: 0.50    Years: 30.00    Pack years: 15.00    Types:  Cigarettes    Quit date: 08/24/2017    Years since quitting: 3.7   Smokeless tobacco: Never  Vaping Use   Vaping Use: Never used  Substance and Sexual Activity   Alcohol use: No   Drug use: No   Sexual activity: Not on file  Other Topics Concern   Not on file  Social History Narrative   Not on  file   Social Determinants of Health   Financial Resource Strain: Not on file  Food Insecurity: Not on file  Transportation Needs: Not on file  Physical Activity: Not on file  Stress: Not on file  Social Connections: Not on file  Intimate Partner Violence: Not on file   Allergies  Allergen Reactions   Tetanus Toxoids Other (See Comments)    Injection site abcess   Sulfa Antibiotics Hives    itching    ROS Review of Systems    Objective:    Physical Exam  BP 103/75   Pulse 99   Temp (!) 97.5 F (36.4 C)   Resp 16   Ht 5' 2" (1.575 m)   Wt 145 lb 6.4 oz (66 kg)   SpO2 95%   BMI 26.59 kg/m  Wt Readings from Last 3 Encounters:  05/08/21 145 lb 6.4 oz (66 kg)  04/26/21 145 lb 6.4 oz (66 kg)  02/26/21 151 lb 3.2 oz (68.6 kg)     Health Maintenance Due  Topic Date Due   Hepatitis C Screening  Never done   Zoster Vaccines- Shingrix (1 of 2) Never done   MAMMOGRAM  Never done   PAP SMEAR-Modifier  02/25/2020   COVID-19 Vaccine (4 - Booster for Pfizer series) 12/09/2020   INFLUENZA VACCINE  03/12/2021    There are no preventive care reminders to display for this patient.  Lab Results  Component Value Date   TSH 0.10 (L) 02/26/2021   Lab Results  Component Value Date   WBC 6.3 02/26/2021   HGB 14.2 02/26/2021   HCT 42.4 02/26/2021   MCV 90.2 02/26/2021   PLT 334 02/26/2021   Lab Results  Component Value Date   NA 139 02/26/2021   K 4.3 02/26/2021   CO2 26 02/26/2021   GLUCOSE 89 02/26/2021   BUN 21 02/26/2021   CREATININE 0.55 02/26/2021   BILITOT 0.5 02/26/2021   ALKPHOS 58 01/18/2021   AST 56 (H) 02/26/2021   ALT 61 (H) 02/26/2021   PROT 7.1  02/26/2021   ALBUMIN 4.2 01/18/2021   CALCIUM 11.3 (H) 02/26/2021   ANIONGAP 13 01/18/2021   EGFR 110 02/26/2021   GFR 99.09 01/16/2017   Lab Results  Component Value Date   CHOL 129 02/26/2021   Lab Results  Component Value Date   HDL 45 (L) 02/26/2021   Lab Results  Component Value Date   LDLCALC 54 02/26/2021   Lab Results  Component Value Date   TRIG 242 (H) 02/26/2021   Lab Results  Component Value Date   CHOLHDL 2.9 02/26/2021   Lab Results  Component Value Date   HGBA1C 5.4 02/26/2021      Assessment & Plan:   Problem List Items Addressed This Visit   None Visit Diagnoses     Urinary tract infection with hematuria, site unspecified    -  Primary   Relevant Orders   Urinalysis, Routine w reflex microscopic   Urine Culture       Meds ordered this encounter  Medications   levofloxacin (LEVAQUIN) 500 MG tablet    Sig: Take  1 tablet  now & repeat at Bedtime today, then tomorrow Start  1 tablet  Daily  for UTI    Dispense:  20 tablet    Refill:  0    Follow-up: No follow-ups on file.    Kirtland Bouchard, MD  Problem list She has Hypothyroidism; Type 2 diabetes mellitus ([place]); Vitamin D deficiency; Postmenopausal; Hot flashes due to menopause; Vitamin B12 deficiency; Peripheral neuropathy; Hyperlipidemia associated with type 2 diabetes mellitus ([place]); GERD (gastroesophageal reflux disease); Obesity (BMI 30.0-34.9); Bilateral lower extremity edema; Chronic diastolic (congestive) heart failure ([place]); Hepatic steatosis; Chronic suppurative otitis media of left ear; Fatigue; Former smoker; FHx: heart disease; Class 1 obesity due to excess calories with serious comorbidity and body mass index (BMI) of 31.0 to 31.9 in adult; Hypertension; Insomnia; and Dyspnea on minimal exertion on their problem list.    Observations/Objective:  BP 103/75   Pulse 99   Temp (!) 97.5 F (36.4 C)   Resp 16   Wt 145 lb 6.4 oz (66 kg)   SpO2 95%   BMI 25.76 kg/m   HEENT - WNL. Neck - supple.  Chest - Clear equal BS. Cor - Nl HS. RRR w/o sig MGR. PP 1(+). No edema. Abd -  Soft benign and suprapubic tenderness.  MS- FROM w/o deformities.  Gait Nl. Neuro -  Nl w/o focal abnormalities.  Assessment and Plan:  1. Urinary tract infection with hematuria  - Urinalysis, Routine w reflex microscopic - Urine Culture  - Rx Levaquin 500  mg - # 20  sig -  1  tab qd   Follow Up Instructions:        I discussed the assessment and treatment plan with the patient. The patient was provided an opportunity to ask questions and all were answered. The patient agreed with the plan and demonstrated an understanding of the instructions.       The patient was advised to call back or seek an in-person evaluation if the symptoms worsen or if the condition fails to improve as anticipated.    Kirtland Bouchard, MD

## 2021-05-12 LAB — URINE CULTURE
MICRO NUMBER:: 12428808
SPECIMEN QUALITY:: ADEQUATE

## 2021-05-12 LAB — URINALYSIS, ROUTINE W REFLEX MICROSCOPIC
Bilirubin Urine: NEGATIVE
Hyaline Cast: NONE SEEN /LPF
Ketones, ur: NEGATIVE
Nitrite: NEGATIVE
Specific Gravity, Urine: 1.024 (ref 1.001–1.035)
Squamous Epithelial / HPF: NONE SEEN /HPF (ref <=5)
WBC, UA: >=60 /HPF — AB (ref 0–5)
pH: <=5 (ref 5.0–8.0)

## 2021-05-12 LAB — MICROSCOPIC MESSAGE

## 2021-05-12 NOTE — Progress Notes (Signed)
============================================================ ============================================================  -   U/C grew E.coli & Klebsiella - Both very sensitive to Levaquin.   - So anticipate good response & clearing of UTI, but still suggest   - Repeat U/A & C/S in about 3-4 weeks to assure cleared up  - Also recommend take Vitamin C 1,000 mg 2 x /day                                                                           to acidify urine to prevent UTI's.  ============================================================ ============================================================

## 2021-05-15 NOTE — Patient Instructions (Signed)

## 2021-05-15 NOTE — Progress Notes (Signed)
Future Appointments  Date Time Provider [place]  05/16/2021   - 6 mo OV 10:30 AM Unk Pinto, MD [facility] None  11/07/2021   - CPE  10:00 AM Unk Pinto, MD [facility] None    History of Present Illness:       This very nice 55 y.o. MWF  presents for  6 month follow up with HTN, HLD, Hypothyroidism, T2_NIDDM and Vitamin D Deficiency.        Patient was tx'd 2 weeks ago with Augmentin for a Klebsiella UTI & Sx's worsened & repeat culture grew both Klebsiella & E.coli and Abx was switched to Levaquin. Patient relates prior hx/o Interstitial cystitis & still having bladder pains.        Patient is treated for HTN & BP has been controlled at home. Today's BP 9is at goal - 114/70. Patient has had no complaints of any cardiac type chest pain, palpitations, dyspnea / orthopnea / PND, dizziness, claudication, or dependent edema.       Hyperlipidemia is controlled with diet & Fenofibrate Andree Elk Juliane Lack. Patient denies myalgias or other med SE's. Last Lipids were at goal except elevated Trig's:  Lab Results  Component Value Date   CHOL 129 02/26/2021   HDL 45 (L) 02/26/2021   LDLCALC 54 02/26/2021   TRIG 242 (H) 02/26/2021   CHOLHDL 2.9 02/26/2021     Also, the patient has history of T2_NIDDM  after hospitalization in Dec 2010 w/Covid PNA  & treated with steroids exacerbating hyperglycemia necessitating insulin for glucose control and unfortunate weight gain. Since then, she  did use low dose Ozempic for a short period of time  and has since d/c'd  Metformin & is only on Jardiance and has had no symptoms of reactive hypoglycemia, diabetic polys, paresthesias or visual blurring.  After a weight loss of ~ 25#, her last A1c was  improved from 7.6% to 5.4%  :  Lab Results  Component Value Date   HGBA1C 5.4 02/26/2021                                                           Further, the patient also has history of Vitamin D Deficiency and supplements  vitamin D without any suspected side-effects. Last vitamin D was at goal:  Lab Results  Component Value Date   VD25OH 79 02/26/2021   Wt Readings from Last 3 Encounters:  05/08/21 145 lb 6.4 oz (66 kg)  04/26/21 145 lb 6.4 oz (66 kg)  02/26/21 151 lb 3.2 oz (68.6 kg)      Current Outpatient Medications on File Prior to Visit  Medication Sig   albuterol (PROVENTIL HFA;VENTOLIN HFA) 108 (90 Base) MCG/ACT inhaler 2 inhalations  15-20  minutes apart every 4 hours as needed to rescue Asthma (Patient taking differently: Inhale 2 puffs into the lungs every 4 (four) hours as needed (asthma). Inhale 15-20  minutes apart)   albuterol (PROVENTIL) (2.5 MG/3ML) 0.083% nebulizer solution Take 3 mLs (2.5 mg total) by nebulization every 6 (six) hours as needed for wheezing or shortness of breath.   aspirin EC 81 MG tablet Take 81 mg by mouth daily.   blood glucose meter kit and supplies KIT Dispense based on patient and insurance preference. Use up to four times daily as directed. (FOR  ICD-9 250.00, 250.01).   butalbital-acetaminophen-caffeine (FIORICET) 50-325-40 MG tablet Take 0.5 tablets by mouth every 4 (four) hours as needed for headache.   cephALEXin (KEFLEX) 250 MG capsule Take 1 tab after intercourse as needed to prevent recurrent UTI.   cetirizine (ZYRTEC) 10 MG tablet Take 10 mg by mouth daily.   Cholecalciferol 1000 units capsule Take 5,000 Units by mouth 2 (two) times daily.    Cyanocobalamin (VITAMIN B-12 SL) Place 1 tablet under the tongue daily.   cyclobenzaprine (FLEXERIL) 5 MG tablet Take 1 tablet (5 mg total) by mouth 3 (three) times daily as needed for muscle spasms.   fenofibrate (TRICOR) 145 MG tablet TAKE 1 TABLET DAILY FOR TRIGLYCERIDES (BLOOD FATS)   furosemide (LASIX) 40 MG tablet Take 0.5-1 tablets (20-40 mg total) by mouth daily as needed. NEED OV.   glucose blood test strip Test blood sugar once daily   icosapent Ethyl (VASCEPA) 1 g capsule Take  1 capsule 2 x /day for for  Triglycerides (Blood Fats)  /      TAKE 1 CAPSULE BY MOUTH 2 TIMES DAILY.   JARDIANCE 25 MG TABS tablet TAKE 1 TABLET BY MOUTH DAILY FOR DIABETES   levofloxacin (LEVAQUIN) 500 MG tablet Take  1 tablet                                                                                                                                                 now & repeat at Bedtime today, then tomorrow Start  1 tablet  Daily  for UTI   levothyroxine (SYNTHROID) 125 MCG tablet TAKE 1 TAB DAILY ON AN EMPTY STOMACH WITH ONLY WATER FOR 30 MINUTES & NO ANTACID MEDS, CALCIUM OR MAGNESIUM FOR 4 HOURS & AVOID BIOTIN   Magnesium 250 MG TABS Take 250 mg by mouth daily.   meloxicam (MOBIC) 15 MG tablet TAKE 1/2 TO 1 TABLET DAILY WITH FOOD FOR PAIN & INFLAMMATION   ondansetron (ZOFRAN) 4 MG tablet Take 1 tablet (4 mg total) by mouth every 6 (six) hours as needed for nausea.   ondansetron (ZOFRAN-ODT) 4 MG disintegrating tablet Take 1 tablet (4 mg total) by mouth every 8 (eight) hours as needed for nausea or vomiting.   phenazopyridine (PYRIDIUM) 200 MG tablet Take 1 tab up to three times a day as needed for bladder pain.   phentermine (ADIPEX-P) 37.5 MG tablet Take 1/2 to 1 tablet every Morning for Dieting & Weight Loss   potassium chloride (KLOR-CON) 10 MEQ tablet TAKE 1 TABLET BY MOUTH EVERY DAY   pravastatin (PRAVACHOL) 20 MG tablet TAKE 1 TABLET BY MOUTH EVERYDAY AT BEDTIME   pregabalin (LYRICA) 200 MG capsule Take  1 capsule  2 to 3 x /day for Painful Neuropathy (Dx: e11.40)   PREMPRO 0.45-1.5 MG tablet TAKE 1 TABLET BY MOUTH EVERY DAY   topiramate (TOPAMAX) 50  MG tablet Take 1/2 to 1 tablet 2 x /day at Suppertime & Bedtime for Dieting & Weight Loss   traMADol (ULTRAM) 50 MG tablet Take     1 to 2 tablets      every 4 hours if needed    for Severe Pain   traZODone (DESYREL) 150 MG tablet Take 1/2 to 1 tablet 1 hour before Bedtime if needed for Sleep (icd 10  Dx:  g47.00)   valACYclovir (VALTREX) 1000 MG tablet Take 1  tablet 3 x /day for Fever Blisters / Cold Sores (Patient taking differently: daily as needed. Take 1 tablet 3 x /day for Fever Blisters / Cold Sores)   No current facility-administered medications on file prior to visit.     Allergies  Allergen Reactions   Tetanus Toxoids Other (See Comments)    Injection site abcess   Sulfa Antibiotics Hives    itching     PMHx:   Past Medical History:  Diagnosis Date   COVID-19    Diabetes mellitus without complication ([place])    Diverticulitis    GERD (gastroesophageal reflux disease)    Hyperlipidemia    IBS (irritable bowel syndrome)    Insomnia 12/10/2019   Migraine    Pneumonia 11/2017   Thyroid disease    UTI (urinary tract infection)      Immunization History  Administered Date(s) Administered   Influenza Inj Mdck Quad  07/22/2017, 05/14/2018   PFIZER  Covid-19 Tri-Sucrose Vacc 09/16/2020   PFIZER SARS-COV-2 Vacc 12/30/2019, 01/27/2020   Pneumococcal -23 08/20/2018     Past Surgical History:  Procedure Laterality Date   [place]   cystoscopies     multiple   FUNCTIONAL ENDOSCOPIC SINUS SURGERY  2003   [place]   TUBAL LIGATION     TYMPANOPLASTY W/ MASTOIDECTOMY Left 2016 & 2017    FHx:    Reviewed / unchanged  SHx:    Reviewed / unchanged   Systems Review:  Constitutional: Denies fever, chills, wt changes, headaches, insomnia, fatigue, night sweats, change in appetite. Eyes: Denies redness, blurred vision, diplopia, discharge, itchy, watery eyes.  ENT: Denies discharge, congestion, post nasal drip, epistaxis, sore throat, earache, hearing loss, dental pain, tinnitus, vertigo, sinus pain, snoring.  CV: Denies chest pain, palpitations, irregular heartbeat, syncope, dyspnea, diaphoresis, orthopnea, PND, claudication or edema. Respiratory: denies cough, dyspnea, DOE, pleurisy, hoarseness, laryngitis, wheezing.  Gastrointestinal: Denies dysphagia,  odynophagia, heartburn, reflux, water brash, abdominal pain or cramps, nausea, vomiting, bloating, diarrhea, constipation, hematemesis, melena, hematochezia  or hemorrhoids. Genitourinary: Denies dysuria, frequency, urgency, nocturia, hesitancy, discharge, hematuria or flank pain. Musculoskeletal: Denies arthralgias, myalgias, stiffness, jt. swelling, pain, limping or strain/sprain.  Skin: Denies pruritus, rash, hives, warts, acne, eczema or change in skin lesion(s). Neuro: No weakness, tremor, incoordination, spasms, paresthesia or pain. Psychiatric: Denies confusion, memory loss or sensory loss. Endo: Denies change in weight, skin or hair change.  Heme/Lymph: No excessive bleeding, bruising or enlarged lymph nodes.  Physical Exam  BP 114/70   Pulse 93   Temp 97.9 F (36.6 C)   Resp 16   Ht $R'5\' 3"'La$  (1.6 m)   Wt 143 lb (64.9 kg)   SpO2 94%   BMI 25.33 kg/m   Appears  well nourished, well groomed  and in no distress.  Eyes: PERRLA, EOMs, conjunctiva no swelling or erythema. Sinuses: No frontal/maxillary tenderness ENT/Mouth: EAC's clear, TM's nl w/o erythema, bulging. Nares clear w/o erythema, swelling,  exudates. Oropharynx clear without erythema or exudates. Oral hygiene is good. Tongue normal, non obstructing. Hearing intact.  Neck: Supple. Thyroid not palpable. Car 2+/2+ without bruits, nodes or JVD. Chest: Respirations nl with BS clear & equal w/o rales, rhonchi, wheezing or stridor.  Cor: Heart sounds normal w/ regular rate and rhythm without sig. murmurs, gallops, clicks or rubs. Peripheral pulses normal and equal  without edema.  Abdomen: Soft & bowel sounds normal. Non-tender w/o guarding, rebound, hernias, masses or organomegaly.  Lymphatics: Unremarkable.  Musculoskeletal: Full ROM all peripheral extremities, joint stability, 5/5 strength and normal gait.  Skin: Warm, dry without exposed rashes, lesions or ecchymosis apparent.  Neuro: Cranial nerves intact, reflexes equal  bilaterally. Sensory-motor testing grossly intact. Tendon reflexes grossly intact.  Pysch: Alert & oriented x 3.  Insight and judgement nl & appropriate. No ideations.  Assessment and Plan:  1. Labile hypertension  - Continue medication, monitor blood pressure at home.  - Continue DASH diet.  Reminder to go to the ER if any CP,  SOB, nausea, dizziness, severe HA, changes vision/speech.   - CBC with Differential/Platelet - COMPLETE METABOLIC PANEL WITH GFR - Magnesium - TSH  2. Hyperlipidemia associated with type 2 diabetes mellitus ([place])  - Continue diet/meds, exercise,& lifestyle modifications.  - Continue monitor periodic cholesterol/liver & renal functions    - TSH  3. Type 2 diabetes mellitus with diabetic neuropathy,  without long-term current use of insulin ([facility])  - Continue diet, exercise  - Lifestyle modifications.  - Monitor appropriate lab  - Fructosamine - Insulin, random  4. Vitamin D deficiency  - Continue supplementation.   5. Hypothyroidism  - TSH  6. Medication management  - CBC with Differential/Platelet - COMPLETE METABOLIC PANEL WITH GFR - Magnesium - TSH - Fructosamine - Insulin, random         Patient requests Urology referral for hx/o Interstitial & recurrent UTI's.          Discussed  regular exercise, BP monitoring, weight control to achieve/maintain BMI less than 25 and discussed med and SE's. Recommended labs to assess and monitor clinical status with further disposition pending results of labs.  I discussed the assessment and treatment plan with the patient. The patient was provided an opportunity to ask questions and all were answered. The patient agreed with the plan and demonstrated an understanding of the instructions.  I provided over 30 minutes of exam, counseling, chart review and  complex critical decision making.        The patient was advised to call back or seek an in-person evaluation if the symptoms worsen or if the  condition fails to improve as anticipated.   Kirtland Bouchard, MD

## 2021-05-16 ENCOUNTER — Encounter: Payer: Self-pay | Admitting: Internal Medicine

## 2021-05-16 ENCOUNTER — Ambulatory Visit (INDEPENDENT_AMBULATORY_CARE_PROVIDER_SITE_OTHER): Payer: Managed Care, Other (non HMO) | Admitting: Internal Medicine

## 2021-05-16 ENCOUNTER — Ambulatory Visit: Payer: Managed Care, Other (non HMO) | Admitting: Adult Health

## 2021-05-16 ENCOUNTER — Other Ambulatory Visit: Payer: Self-pay

## 2021-05-16 VITALS — BP 114/70 | HR 93 | Temp 97.9°F | Resp 16 | Ht 63.0 in | Wt 143.0 lb

## 2021-05-16 DIAGNOSIS — E1169 Type 2 diabetes mellitus with other specified complication: Secondary | ICD-10-CM

## 2021-05-16 DIAGNOSIS — Z79899 Other long term (current) drug therapy: Secondary | ICD-10-CM | POA: Diagnosis not present

## 2021-05-16 DIAGNOSIS — N3010 Interstitial cystitis (chronic) without hematuria: Secondary | ICD-10-CM

## 2021-05-16 DIAGNOSIS — E559 Vitamin D deficiency, unspecified: Secondary | ICD-10-CM

## 2021-05-16 DIAGNOSIS — E785 Hyperlipidemia, unspecified: Secondary | ICD-10-CM

## 2021-05-16 DIAGNOSIS — E1140 Type 2 diabetes mellitus with diabetic neuropathy, unspecified: Secondary | ICD-10-CM | POA: Diagnosis not present

## 2021-05-16 DIAGNOSIS — N390 Urinary tract infection, site not specified: Secondary | ICD-10-CM

## 2021-05-16 DIAGNOSIS — E039 Hypothyroidism, unspecified: Secondary | ICD-10-CM

## 2021-05-16 DIAGNOSIS — R0989 Other specified symptoms and signs involving the circulatory and respiratory systems: Secondary | ICD-10-CM

## 2021-05-17 ENCOUNTER — Other Ambulatory Visit: Payer: Self-pay | Admitting: Internal Medicine

## 2021-05-17 DIAGNOSIS — E039 Hypothyroidism, unspecified: Secondary | ICD-10-CM

## 2021-05-17 NOTE — Progress Notes (Signed)
============================================================ -   Test results slightly outside the reference range are not unusual. If there is anything important, I will review this with you,  otherwise it is considered normal test values.  If you have further questions,  please do not hesitate to contact me at the office or via My Chart.  ============================================================ ============================================================  -  Low TSH  means Thyroid hormone is slightly elevated in blood,  So   If taking Thyroxine 125 mcg tab Daily  - suggest decrease to 5 tablets /week,   - take 1 whole tab 3 x /week on MWF & take 1/2 tab 4 x /week on TThSS  - Then call office to schedule a lab visit in about a month to recheck TSH ============================================================ ============================================================  -  All Else - CBC - Kidneys - Electrolytes - Liver - Magnesium & Thyroid    - all  Normal / OK ============================================================ ============================================================

## 2021-05-19 LAB — CBC WITH DIFFERENTIAL/PLATELET
Absolute Monocytes: 672 cells/uL (ref 200–950)
Basophils Absolute: 50 cells/uL (ref 0–200)
Basophils Relative: 0.6 %
Eosinophils Absolute: 210 cells/uL (ref 15–500)
Eosinophils Relative: 2.5 %
HCT: 42.7 % (ref 35.0–45.0)
Hemoglobin: 14 g/dL (ref 11.7–15.5)
Lymphs Abs: 1932 cells/uL (ref 850–3900)
MCH: 30.4 pg (ref 27.0–33.0)
MCHC: 32.8 g/dL (ref 32.0–36.0)
MCV: 92.6 fL (ref 80.0–100.0)
MPV: 10.9 fL (ref 7.5–12.5)
Monocytes Relative: 8 %
Neutro Abs: 5536 cells/uL (ref 1500–7800)
Neutrophils Relative %: 65.9 %
Platelets: 324 10*3/uL (ref 140–400)
RBC: 4.61 10*6/uL (ref 3.80–5.10)
RDW: 13.1 % (ref 11.0–15.0)
Total Lymphocyte: 23 %
WBC: 8.4 10*3/uL (ref 3.8–10.8)

## 2021-05-19 LAB — COMPLETE METABOLIC PANEL WITH GFR
AG Ratio: 2.1 (calc) (ref 1.0–2.5)
ALT: 26 U/L (ref 6–29)
AST: 25 U/L (ref 10–35)
Albumin: 4.7 g/dL (ref 3.6–5.1)
Alkaline phosphatase (APISO): 49 U/L (ref 37–153)
BUN: 25 mg/dL (ref 7–25)
CO2: 27 mmol/L (ref 20–32)
Calcium: 10.5 mg/dL — ABNORMAL HIGH (ref 8.6–10.4)
Chloride: 105 mmol/L (ref 98–110)
Creat: 0.68 mg/dL (ref 0.50–1.03)
Globulin: 2.2 g/dL (calc) (ref 1.9–3.7)
Glucose, Bld: 94 mg/dL (ref 65–99)
Potassium: 4.2 mmol/L (ref 3.5–5.3)
Sodium: 140 mmol/L (ref 135–146)
Total Bilirubin: 0.6 mg/dL (ref 0.2–1.2)
Total Protein: 6.9 g/dL (ref 6.1–8.1)
eGFR: 103 mL/min/{1.73_m2} (ref >=60)

## 2021-05-19 LAB — MAGNESIUM: Magnesium: 1.9 mg/dL (ref 1.5–2.5)

## 2021-05-19 LAB — FRUCTOSAMINE: Fructosamine: 207 umol/L (ref 205–285)

## 2021-05-19 LAB — TSH: TSH: 0.05 mIU/L — ABNORMAL LOW

## 2021-05-19 LAB — INSULIN, RANDOM: Insulin: 8.7 u[IU]/mL

## 2021-05-19 NOTE — Progress Notes (Signed)
============================================================ ============================================================  -    Fructosamine = 207 mg% and approximates an A1c of ~ 5.0% - Wonderful !   ============================================================ ============================================================

## 2021-06-24 ENCOUNTER — Emergency Department (HOSPITAL_COMMUNITY): Payer: Managed Care, Other (non HMO)

## 2021-06-24 ENCOUNTER — Emergency Department (HOSPITAL_COMMUNITY)
Admission: EM | Admit: 2021-06-24 | Discharge: 2021-06-25 | Disposition: A | Discharge status: Home / Self Care | Payer: Managed Care, Other (non HMO) | Attending: Emergency Medicine | Admitting: Emergency Medicine

## 2021-06-24 ENCOUNTER — Encounter (HOSPITAL_COMMUNITY): Payer: Self-pay | Admitting: Emergency Medicine

## 2021-06-24 ENCOUNTER — Other Ambulatory Visit: Payer: Self-pay

## 2021-06-24 DIAGNOSIS — E1169 Type 2 diabetes mellitus with other specified complication: Secondary | ICD-10-CM | POA: Insufficient documentation

## 2021-06-24 DIAGNOSIS — E785 Hyperlipidemia, unspecified: Secondary | ICD-10-CM | POA: Diagnosis not present

## 2021-06-24 DIAGNOSIS — Z79899 Other long term (current) drug therapy: Secondary | ICD-10-CM | POA: Insufficient documentation

## 2021-06-24 DIAGNOSIS — N132 Hydronephrosis with renal and ureteral calculous obstruction: Secondary | ICD-10-CM | POA: Insufficient documentation

## 2021-06-24 DIAGNOSIS — R109 Unspecified abdominal pain: Secondary | ICD-10-CM | POA: Diagnosis present

## 2021-06-24 DIAGNOSIS — N201 Calculus of ureter: Secondary | ICD-10-CM

## 2021-06-24 DIAGNOSIS — E039 Hypothyroidism, unspecified: Secondary | ICD-10-CM | POA: Diagnosis not present

## 2021-06-24 DIAGNOSIS — N200 Calculus of kidney: Secondary | ICD-10-CM

## 2021-06-24 DIAGNOSIS — Z7982 Long term (current) use of aspirin: Secondary | ICD-10-CM | POA: Diagnosis not present

## 2021-06-24 DIAGNOSIS — K219 Gastro-esophageal reflux disease without esophagitis: Secondary | ICD-10-CM | POA: Diagnosis not present

## 2021-06-24 DIAGNOSIS — I110 Hypertensive heart disease with heart failure: Secondary | ICD-10-CM | POA: Diagnosis not present

## 2021-06-24 DIAGNOSIS — Z8616 Personal history of COVID-19: Secondary | ICD-10-CM | POA: Insufficient documentation

## 2021-06-24 DIAGNOSIS — I5032 Chronic diastolic (congestive) heart failure: Secondary | ICD-10-CM | POA: Insufficient documentation

## 2021-06-24 DIAGNOSIS — Z87891 Personal history of nicotine dependence: Secondary | ICD-10-CM | POA: Insufficient documentation

## 2021-06-24 HISTORY — DX: Calculus of kidney: N20.0

## 2021-06-24 LAB — CBC WITH DIFFERENTIAL/PLATELET
Abs Immature Granulocytes: 0.03 10*3/uL (ref 0.00–0.07)
Basophils Absolute: 0.1 10*3/uL (ref 0.0–0.1)
Basophils Relative: 1 %
Eosinophils Absolute: 0.2 10*3/uL (ref 0.0–0.5)
Eosinophils Relative: 2 %
HCT: 44.3 % (ref 36.0–46.0)
Hemoglobin: 14.3 g/dL (ref 12.0–15.0)
Immature Granulocytes: 0 %
Lymphocytes Relative: 35 %
Lymphs Abs: 2.9 10*3/uL (ref 0.7–4.0)
MCH: 31 pg (ref 26.0–34.0)
MCHC: 32.3 g/dL (ref 30.0–36.0)
MCV: 95.9 fL (ref 80.0–100.0)
Monocytes Absolute: 0.7 10*3/uL (ref 0.1–1.0)
Monocytes Relative: 8 %
Neutro Abs: 4.5 10*3/uL (ref 1.7–7.7)
Neutrophils Relative %: 54 %
Platelets: 297 10*3/uL (ref 150–400)
RBC: 4.62 MIL/uL (ref 3.87–5.11)
RDW: 12.9 % (ref 11.5–15.5)
WBC: 8.3 10*3/uL (ref 4.0–10.5)
nRBC: 0 % (ref 0.0–0.2)

## 2021-06-24 LAB — URINALYSIS, ROUTINE W REFLEX MICROSCOPIC
Bacteria, UA: NONE SEEN
Bilirubin Urine: NEGATIVE
Glucose, UA: >=500 mg/dL — AB
Ketones, ur: NEGATIVE mg/dL
Leukocytes,Ua: NEGATIVE
Nitrite: NEGATIVE
Protein, ur: NEGATIVE mg/dL
Specific Gravity, Urine: 1.006 (ref 1.005–1.030)
pH: 7 (ref 5.0–8.0)

## 2021-06-24 LAB — COMPREHENSIVE METABOLIC PANEL
ALT: 33 U/L (ref 0–44)
AST: 38 U/L (ref 15–41)
Albumin: 4.2 g/dL (ref 3.5–5.0)
Alkaline Phosphatase: 40 U/L (ref 38–126)
Anion gap: 10 (ref 5–15)
BUN: 16 mg/dL (ref 6–20)
CO2: 23 mmol/L (ref 22–32)
Calcium: 9.8 mg/dL (ref 8.9–10.3)
Chloride: 105 mmol/L (ref 98–111)
Creatinine, Ser: 0.72 mg/dL (ref 0.44–1.00)
GFR, Estimated: >60 mL/min (ref >60)
Glucose, Bld: 85 mg/dL (ref 70–99)
Potassium: 4.1 mmol/L (ref 3.5–5.1)
Sodium: 138 mmol/L (ref 135–145)
Total Bilirubin: 0.7 mg/dL (ref 0.3–1.2)
Total Protein: 6.5 g/dL (ref 6.5–8.1)

## 2021-06-24 MED ORDER — KETOROLAC TROMETHAMINE 15 MG/ML IJ SOLN
15.0000 mg | Freq: Once | INTRAMUSCULAR | Status: AC
  Administered 2021-06-24: 15 mg via INTRAVENOUS
  Filled 2021-06-24: qty 1

## 2021-06-24 MED ORDER — OXYCODONE-ACETAMINOPHEN 5-325 MG PO TABS
1.0000 | ORAL_TABLET | Freq: Once | ORAL | Status: AC
  Administered 2021-06-24: 1 via ORAL
  Filled 2021-06-24: qty 1

## 2021-06-24 MED ORDER — MORPHINE SULFATE (PF) 4 MG/ML IV SOLN
4.0000 mg | Freq: Once | INTRAVENOUS | Status: AC
  Administered 2021-06-24: 4 mg via INTRAVENOUS
  Filled 2021-06-24: qty 1

## 2021-06-24 MED ORDER — HYDROCODONE-ACETAMINOPHEN 5-325 MG PO TABS: 1.0000 | ORAL_TABLET | Freq: Four times a day (QID) | ORAL | 0 refills | Status: DC | PRN

## 2021-06-24 MED ORDER — ONDANSETRON 4 MG PO TBDP: 4.0000 mg | ORAL_TABLET | Freq: Four times a day (QID) | ORAL | 0 refills | Status: DC | PRN

## 2021-06-24 MED ORDER — ONDANSETRON 4 MG PO TBDP
4.0000 mg | ORAL_TABLET | Freq: Once | ORAL | Status: AC
  Administered 2021-06-24: 4 mg via ORAL
  Filled 2021-06-24: qty 1

## 2021-06-24 NOTE — Discharge Instructions (Addendum)
Please take Ibuprofen (Advil, motrin) and Tylenol (acetaminophen) to relieve your pain.    You may take up to 600 MG (3 pills) of normal strength ibuprofen every 8 hours as needed.   You make take tylenol, up to 1,000 mg (two extra strength pills) every 8 hours as needed.   It is safe to take ibuprofen and tylenol at the same time as they work differently.   Do not take more than 3,000 mg tylenol in a 24 hour period (not more than one dose every 8 hours.  Please check all medication labels as many medications such as pain and cold medications may contain tylenol.  Do not drink alcohol while taking these medications.  Do not take other NSAID'S while taking ibuprofen (such as aleve or naproxen).  Please take ibuprofen with food to decrease stomach upset.  Today you received medications that may make you sleepy or impair your ability to make decisions.  For the next 24 hours please do not drive, operate heavy machinery, care for a small child with out another adult present, or perform any activities that may cause harm to you or someone else if you were to fall asleep or be impaired.   You are being prescribed a medication which may make you sleepy. Please follow up of listed precautions for at least 24 hours after taking one dose.  

## 2021-06-24 NOTE — ED Notes (Signed)
Received verbal report from Nichole D RN at this time 

## 2021-06-24 NOTE — ED Provider Notes (Signed)
[facility] EMERGENCY DEPARTMENT Provider Note   CSN: [id number] Arrival date & time: 06/24/21  1340     History Chief Complaint  Patient presents with   Flank Pain    Caitlin Barnett is a 55 y.o. female with a past medical history of DM, diverticulitis, remote history of kidney stones, GERD, hypothyroid, who presents today for evaluation of right-sided flank pain.  She states that she had acute onset of right flank pain today.  She has remote history of kidney stones however has not had any in many years.  She states that she does have a urologist appointment already scheduled for IC evaluation.  She had a UTI "a few weeks ago" and finish antibiotics for that.  Chart review shows that she was seen 04/26/2021 for a UTI.  The culture then showed Klebsiella and E. coli resistant to Macrobid, sensitive to Levaquin.  She reports that she finished that.    Her pain is severe.  It started more right sided flank.  She was treated with meds from triage.  She reports that her meds helped for a while but have now worn off.  She reports her pain is now abdominal.  No chest pain or shortness of breath.   HPI     Past Medical History:  Diagnosis Date   COVID-19    Diabetes mellitus without complication ([place])    Diverticulitis    GERD (gastroesophageal reflux disease)    Hyperlipidemia    IBS (irritable bowel syndrome)    Insomnia 12/10/2019   Kidney stone    Migraine    Pneumonia 11/2017   Thyroid disease    UTI (urinary tract infection)     Patient Active Problem List   Diagnosis Date Noted   Dyspnea on minimal exertion 03/22/2020   Insomnia 12/10/2019   Fatigue 08/21/2018   Former smoker 08/21/2018   FHx: heart disease 08/21/2018   Class 1 obesity due to excess calories with serious comorbidity and body mass index (BMI) of 31.0 to 31.9 in adult 08/21/2018   Hypertension 08/21/2018   Chronic suppurative otitis media of left ear 08/12/2018   Hepatic steatosis  12/09/2017   Chronic diastolic (congestive) heart failure ([place]) 12/01/2017   Bilateral lower extremity edema 11/07/2017   Hyperlipidemia associated with type 2 diabetes mellitus ([place]) 10/20/2017   GERD (gastroesophageal reflux disease) 10/20/2017   Obesity (BMI 30.0-34.9) 10/20/2017   Hypothyroidism 01/16/2017   Type 2 diabetes mellitus ([place]) 01/16/2017   Vitamin D deficiency 01/16/2017   Postmenopausal 01/16/2017   Hot flashes due to menopause 01/16/2017   Vitamin B12 deficiency 01/16/2017   Peripheral neuropathy 01/16/2017    Past Surgical History:  Procedure Laterality Date   CESAREAN SECTION  1993   cystoscopies     multiple   FUNCTIONAL ENDOSCOPIC SINUS SURGERY  2003   [place]   TUBAL LIGATION     TYMPANOPLASTY W/ MASTOIDECTOMY Left 2016 & 2017     OB History   No obstetric history on file.     Family History  Problem Relation Age of Onset   Suicidality Mother    Depression Mother    Alcohol abuse Father    Kidney disease Father    Suicidality Father    Depression Father    Breast cancer Maternal Aunt    Heart disease Maternal Grandmother    Breast cancer Maternal Grandmother    Suicidality Maternal Grandfather    Stroke Paternal Grandmother  Heart disease Paternal Grandmother    Heart disease Paternal Grandfather    Esophageal cancer Maternal Uncle    Colon cancer Neg Hx    Pancreatic cancer Neg Hx    Stomach cancer Neg Hx    Liver disease Neg Hx     Social History   Tobacco Use   Smoking status: Former    Packs/day: 0.50    Years: 30.00    Pack years: 15.00    Types: Cigarettes    Quit date: 08/24/2017    Years since quitting: 3.8   Smokeless tobacco: Never  Vaping Use   Vaping Use: Never used  Substance Use Topics   Alcohol use: No   Drug use: No    Home Medications Prior to Admission medications   Medication Sig Start Date End Date Taking? Authorizing Provider  albuterol (PROVENTIL  HFA;VENTOLIN HFA) 108 (90 Base) MCG/ACT inhaler 2 inhalations  15-20  minutes apart every 4 hours as needed to rescue Asthma Patient taking differently: Inhale 2 puffs into the lungs every 4 (four) hours as needed for wheezing or shortness of breath (asthma). Inhale 15-20  minutes apart 07/29/18  Yes Unk Pinto, MD  albuterol (PROVENTIL) (2.5 MG/3ML) 0.083% nebulizer solution Take 3 mLs (2.5 mg total) by nebulization every 6 (six) hours as needed for wheezing or shortness of breath. 12/05/17  Yes Annita Brod, MD  aspirin EC 81 MG tablet Take 81 mg by mouth daily.   Yes [provider]  butalbital-acetaminophen-caffeine (FIORICET) 50-325-40 MG tablet Take 0.5 tablets by mouth every 4 (four) hours as needed for headache. 08/09/19  Yes Liane Comber, NP  cetirizine (ZYRTEC) 10 MG tablet Take 10 mg by mouth daily.   Yes [provider]  Cholecalciferol 1000 units capsule Take 5,000 Units by mouth 2 (two) times daily.    Yes [provider]  cyanocobalamin (,VITAMIN B-12,) 1000 MCG/ML injection Inject 1,000 mcg into the muscle every 30 (thirty) days. 02/17/21  Yes [provider]  cyclobenzaprine (FLEXERIL) 5 MG tablet Take 1 tablet (5 mg total) by mouth 3 (three) times daily as needed for muscle spasms. 05/31/20  Yes Liane Comber, NP  fenofibrate (TRICOR) 145 MG tablet TAKE 1 TABLET DAILY FOR TRIGLYCERIDES (BLOOD FATS) Patient taking differently: Take 145 mg by mouth daily. 06/18/20  Yes Unk Pinto, MD  furosemide (LASIX) 40 MG tablet Take 0.5-1 tablets (20-40 mg total) by mouth daily as needed. NEED OV. Patient taking differently: Take 20 mg by mouth daily. 02/10/20  Yes Lelon Perla, MD  HYDROcodone-acetaminophen (NORCO/VICODIN) 5-325 MG tablet Take 1 tablet by mouth every 6 (six) hours as needed. 06/24/21  Yes Lorin Glass, PA-C  icosapent Ethyl (VASCEPA) 1 g capsule Take  1 capsule 2 x /day for for Triglycerides (Blood Fats)  /       TAKE 1 CAPSULE BY MOUTH 2 TIMES DAILY. Patient taking differently: Take 1 g by mouth 2 (two) times daily. 04/15/21  Yes Unk Pinto, MD  JARDIANCE 25 MG TABS tablet TAKE 1 TABLET BY MOUTH DAILY FOR DIABETES Patient taking differently: Take 25 mg by mouth daily. 10/20/20  Yes Liane Comber, NP  levothyroxine (SYNTHROID) 112 MCG tablet Take 112 mcg by mouth every morning. 05/11/21  Yes [provider]  Magnesium 250 MG TABS Take 250 mg by mouth daily.   Yes [provider]  meloxicam (MOBIC) 15 MG tablet TAKE 1/2 TO 1 TABLET DAILY WITH FOOD FOR PAIN & INFLAMMATION Patient taking differently: Take 15  mg by mouth daily. 04/18/21  Yes Liane Comber, NP  ondansetron (ZOFRAN ODT) 4 MG disintegrating tablet Take 1 tablet (4 mg total) by mouth every 6 (six) hours as needed for nausea or vomiting. 06/24/21  Yes Lorin Glass, PA-C  OZEMPIC, 0.25 OR 0.5 MG/DOSE, 2 MG/1.5ML SOPN Inject 0.5 mg into the skin once a week. 05/08/21  Yes [provider]  phenazopyridine (PYRIDIUM) 200 MG tablet Take 1 tab up to three times a day as needed for bladder pain. Patient taking differently: Take 200 mg by mouth daily as needed for pain. 04/26/21  Yes Liane Comber, NP  phentermine (ADIPEX-P) 37.5 MG tablet Take 1/2 to 1 tablet every Morning for Dieting & Weight Loss Patient taking differently: Take 18.75 mg by mouth daily before breakfast. 02/26/21  Yes Unk Pinto, MD  potassium chloride (KLOR-CON) 10 MEQ tablet TAKE 1 TABLET BY MOUTH EVERY DAY Patient taking differently: Take 10 mEq by mouth daily as needed (low potassium). 12/23/19  Yes Liane Comber, NP  pravastatin (PRAVACHOL) 20 MG tablet TAKE 1 TABLET BY MOUTH EVERYDAY AT BEDTIME Patient taking differently: Take 20 mg by mouth daily. 03/10/21  Yes Liane Comber, NP  pregabalin (LYRICA) 200 MG capsule Take  1 capsule  2 to 3 x /day for Painful Neuropathy (Dx: e11.40) Patient taking differently: Take 200 mg by mouth 2 (two)  times daily. 10/31/20  Yes Unk Pinto, MD  PREMPRO 0.45-1.5 MG tablet TAKE 1 TABLET BY MOUTH EVERY DAY Patient taking differently: Take 1 tablet by mouth daily. 10/20/20  Yes Liane Comber, NP  traZODone (DESYREL) 150 MG tablet Take 1/2 to 1 tablet 1 hour before Bedtime if needed for Sleep (icd 10  Dx:  g47.00) Patient taking differently: Take 150 mg by mouth at bedtime as needed for sleep. Take 1/2 to 1 tablet 1 hour before Bedtime if needed for Sleep (icd 10  Dx:  g47.00) 03/17/21  Yes Unk Pinto, MD  valACYclovir (VALTREX) 1000 MG tablet Take 1 tablet 3 x /day for Fever Blisters / Cold Sores Patient taking differently: Take 1,000 mg by mouth daily as needed (fever blisters). 10/17/19  Yes Unk Pinto, MD  zinc gluconate 50 MG tablet Take 50 mg by mouth daily.   Yes [provider]  blood glucose meter kit and supplies KIT Dispense based on patient and insurance preference. Use up to four times daily as directed. (FOR ICD-9 250.00, 250.01). 12/05/17   Annita Brod, MD  cephALEXin (KEFLEX) 250 MG capsule Take 1 tab after intercourse as needed to prevent recurrent UTI. Patient not taking: No sig reported 05/01/21   Liane Comber, NP  glucose blood test strip Test blood sugar once daily 08/09/19   Liane Comber, NP  levofloxacin (LEVAQUIN) 500 MG tablet Take  1 tablet  now & repeat at Bedtime today, then tomorrow Start  1 tablet  Daily  for UTI Patient not taking: No sig reported 05/08/21   Unk Pinto, MD  levothyroxine (SYNTHROID) 125 MCG tablet TAKE 1 TAB DAILY ON AN EMPTY STOMACH WITH ONLY WATER FOR 30 MINUTES & NO ANTACID MEDS, CALCIUM OR MAGNESIUM FOR 4 HOURS & AVOID BIOTIN Patient not taking: No sig reported 05/11/20   Garnet Sierras, NP  topiramate (TOPAMAX) 50 MG tablet Take 1/2 to 1 tablet 2 x /day at Suppertime &  Bedtime for Dieting & Weight Loss Patient not taking: No sig reported 02/26/21   Unk Pinto, MD  traMADol Veatrice Bourbon) 50 MG tablet Take     1 to 2 tablets      every 4 hours if needed    for Severe Pain Patient not taking: No sig reported 12/14/20   Unk Pinto, MD    Allergies    Tetanus toxoids and Sulfa antibiotics  Review of Systems   Review of Systems  Constitutional:  Positive for chills.  HENT:  Negative for congestion.   Eyes:  Negative for visual disturbance.  Respiratory:  Negative for shortness of breath.   Cardiovascular:  Negative for chest pain.  Gastrointestinal:  Positive for abdominal pain.  Genitourinary:  Positive for dysuria, flank pain, frequency, hematuria and urgency.  Musculoskeletal:  Negative for arthralgias and neck pain.  Skin:  Negative for color change.  Neurological:  Negative for weakness and headaches.  All other systems reviewed and are negative.  Physical Exam Updated Vital Signs BP 91/62 (BP Location: Right Arm)   Pulse 88   Temp 98 F (36.7 C) (Oral)   Resp 16   Ht $R'5\' 2"'iz$  (1.575 m)   Wt 62.1 kg   SpO2 97%   BMI 25.06 kg/m   Physical Exam Vitals and nursing note reviewed.  Constitutional:      Comments: Appears uncomfortable, is not in physical extremis.  HENT:     Head: Normocephalic and atraumatic.  Eyes:     Conjunctiva/sclera: Conjunctivae normal.  Cardiovascular:     Rate and Rhythm: Normal rate and regular rhythm.     Pulses: Normal pulses.     Heart sounds: Normal heart sounds.  Pulmonary:     Effort: Pulmonary effort is normal. No respiratory distress.  Abdominal:     General: There is no distension.     Tenderness: There is no abdominal tenderness. There is right CVA tenderness. There is no left CVA tenderness.  Genitourinary:    Comments: Deferred Musculoskeletal:     Cervical back: Normal range of motion and neck supple.     Right lower leg: No edema.     Left lower leg: No edema.     Comments: No obvious  acute injury  Skin:    General: Skin is warm.  Neurological:     Mental Status: She is alert.     Comments: Awake and alert, answers all questions appropriately.  Speech is not slurred.  Psychiatric:        Mood and Affect: Mood normal.        Behavior: Behavior normal.    ED Results / Procedures / Treatments   Labs (all labs ordered are listed, but only abnormal results are displayed) Labs Reviewed  URINALYSIS, ROUTINE W REFLEX MICROSCOPIC - Abnormal; Notable for the following components:      Result Value   Color, Urine STRAW (*)    Glucose, UA >=500 (*)  Hgb urine dipstick MODERATE (*)    All other components within normal limits  URINE CULTURE  CBC WITH DIFFERENTIAL/PLATELET  COMPREHENSIVE METABOLIC PANEL    EKG None  Radiology CT Renal Stone Study  Result Date: 06/24/2021 CLINICAL DATA:  Flank pain EXAM: CT ABDOMEN AND PELVIS WITHOUT CONTRAST TECHNIQUE: Multidetector CT imaging of the abdomen and pelvis was performed following the standard protocol without IV contrast. COMPARISON:  None. FINDINGS: Lower chest: Mild left basilar atelectasis.  No acute abnormality. Hepatobiliary: No focal liver abnormality is seen. No gallstones, gallbladder wall thickening, or biliary dilatation. Pancreas: Unremarkable. No pancreatic ductal dilatation or surrounding inflammatory changes. Spleen: Normal in size without focal abnormality. Adrenals/Urinary Tract: Bilateral adrenal glands are unremarkable. Moderate right hydronephrosis and hydroureter. Two small stones are seen at the right ureterovesicular junction measuring 2 and 3 mm. Punctate nonobstructing stones of the upper pole of the left kidney. Stomach/Bowel: Stomach is within normal limits. Appendix appears normal. Diverticulosis. No evidence of bowel wall thickening, distention, or inflammatory changes. Vascular/Lymphatic: Aortic atherosclerosis. No enlarged abdominal or pelvic lymph nodes. Reproductive: Uterus and bilateral adnexa are  unremarkable. Other: No abdominal wall hernia or abnormality. No abdominopelvic ascites. Musculoskeletal: Levocurvature of the lumbar spine. No acute osseous abnormality. IMPRESSION: 1. Two small stones are seen at the right ureterovesicular junction measuring 2 and 3 mm. Moderate right hydronephrosis and hydroureter is suggestive of obstruction. 2. Punctate nonobstructing stones of the upper pole of the left kidney. 3.  Aortic Atherosclerosis (ICD10-I70.0). Electronically Signed   By: Yetta Glassman M.D.   On: 06/24/2021 15:38    Procedures Procedures   Medications Ordered in ED Medications  oxyCODONE-acetaminophen (PERCOCET/ROXICET) 5-325 MG per tablet 1 tablet (1 tablet Oral Given 06/24/21 1453)  ondansetron (ZOFRAN-ODT) disintegrating tablet 4 mg (4 mg Oral Given 06/24/21 1454)  morphine 4 MG/ML injection 4 mg (4 mg Intravenous Given 06/24/21 1856)  ketorolac (TORADOL) 15 MG/ML injection 15 mg (15 mg Intravenous Given 06/24/21 2238)  oxyCODONE-acetaminophen (PERCOCET/ROXICET) 5-325 MG per tablet 1 tablet (1 tablet Oral Given 06/24/21 2237)    ED Course  I have reviewed the triage vital signs and the nursing notes.  Pertinent labs & imaging results that were available during my care of the patient were reviewed by me and considered in my medical decision making (see chart for details).  Clinical Course as of 06/25/21 0007  Nancy Fetter Jun 24, 2021  2126 Messaged RN about meds.  [EH]  2127 Patient reevaluated, her pain is coming back.  She has not yet gotten her IV Toradol.  Ordered p.o. Percocet.  I explained delay with multiple sick patients, patient states her understanding. [EH]    Clinical Course User Index [EH] Ollen Gross   MDM Rules/Calculators/A&P                          Patient is a 55 year old woman who presents today for evaluation of acute onset right-sided flank pain.  She does have a remote history of kidney stones. On my initial exam she appeared  uncomfortable however is not in extremitas.  While in the emergency room her blood pressure was soft, in the 09N systolic.  She states that she normally runs about 80/60 at baseline.  CT scan was obtained showing 2 small stones at the right UVJ measuring 2 and 3 mm.  She also has moderate right hydronephrosis and hydroureter with punctate nonobstructing stones in the left kidney. Incidental findings were discussed with patient.  Labs were obtained and reviewed, CBC and see MP is unremarkable.  UA has moderate blood on dipstick with over 500 glucose, which I suspect is medication related as her blood sugar was normal. Microscopy without any bacteria seen.  Given her reports of frequent UTIs including recent UTIs however we will send a urine culture.  Her pain was treated in the emergency room with multiple rounds of pain medication including Percocet, Toradol, morphine.  She was given Zofran.  After this her pain improved and she felt ready for discharge.  She already has a follow-up appointment with urology on Tuesday scheduled due to her frequent urinary tract infections.  She is encouraged to keep this appointment.  She does not meet SIRS/sepsis criteria while in the ER.  [facility] PMP is consulted, patient is given a prescription for short course of narcotic pain medication.  She is given prescription for Zofran ODT.  Given that her stones are relatively small at 2 and 3 mm, and her blood pressure being soft in the ER and at baseline, will defer on Flomax.  She is given a urine strainer and instructed to strain her urine.  Return precautions were discussed with patient who states their understanding.  At the time of discharge patient denied any unaddressed complaints or concerns.  Patient is agreeable for discharge home.  Note: Portions of this report may have been transcribed using voice recognition software. Every effort was made to ensure accuracy; however, inadvertent computerized  transcription errors may be present   Final Clinical Impression(s) / ED Diagnoses Final diagnoses:  Ureterolithiasis  Kidney stone    Rx / DC Orders ED Discharge Orders          Ordered    ondansetron (ZOFRAN ODT) 4 MG disintegrating tablet  Every 6 hours PRN        06/24/21 2336    HYDROcodone-acetaminophen (NORCO/VICODIN) 5-325 MG tablet  Every 6 hours PRN        06/24/21 2336             Lorin Glass, PA-C 06/25/21 0007    Sherwood Gambler, MD 06/28/21 1616

## 2021-06-24 NOTE — ED Notes (Signed)
Ambulated to restroom without assistance 

## 2021-06-24 NOTE — ED Triage Notes (Signed)
Pt reports R flank pain that radiates to R abd since 10am with burning with urination.  Hematuria 1 week ago.  History of kidney stones.

## 2021-06-24 NOTE — ED Provider Notes (Signed)
Emergency Medicine Provider Triage Evaluation Note  Caitlin Barnett , a 55 y.o. female  was evaluated in triage.  Pt complains of acute onset right flank pain.  History of kidney stones.  Says that this feels the same.  Earlier he noticed some dysuria and hematuria.  Was ill at the beginning of last week with flulike symptoms.  Review of Systems  Positive: As above Negative:   Physical Exam  BP 101/67   Pulse 80   Temp 97.9 F (36.6 C)   Resp 18   SpO2 98%  Gen:   Awake, no distress   Resp:  Normal effort  MSK:   Moves extremities without difficulty  Other:  Obvious discomfort, CVA tenderness present on the right side.  No abdominal tenderness, left CVA negative.  Medical Decision Making  Medically screening exam initiated at 2:49 PM.  Appropriate orders placed.  Caitlin Barnett was informed that the remainder of the evaluation will be completed by another provider, this initial triage assessment does not replace that evaluation, and the importance of remaining in the ED until their evaluation is complete.     Caitlin Benders, PA-C 06/24/21 1450    Caitlin Manchester, MD 06/25/21 0004

## 2021-06-26 LAB — URINE CULTURE: Culture: <10000 — AB

## 2021-06-27 ENCOUNTER — Other Ambulatory Visit: Payer: Self-pay | Admitting: Urology

## 2021-06-28 ENCOUNTER — Encounter (HOSPITAL_COMMUNITY): Payer: Self-pay

## 2021-06-28 NOTE — Progress Notes (Addendum)
COVID swab appointment: N/A  COVID Vaccine Completed: Yes x3 Date COVID Vaccine completed:  12-30-19 01-27-20 Has received booster: Yes x1 09-16-20 COVID vaccine manufacturer: Pfizer      Date of COVID positive in last 90 days:  No  PCP - [name], MD Cardiologist - Nanetta Batty, MD  Chest x-ray - 01-18-21 Epic EKG - 01-19-21 Epic Stress Test - greater than 2 years Epic ECHO - greater than 2 years Epic Cardiac Cath - N/A Pacemaker/ICD device last checked: Spinal Cord Stimulator: Telemetry monitor -2019 Epic  Sleep Study - N/A CPAP -   Fasting Blood Sugar - 100 or less Checks Blood Sugar - checks as needed  Blood Thinner Instructions: Aspirin Instructions:  Aspirin 81 pt to stay on per Dr. Berneice Heinrich.  Patient is aware Last Dose:  Activity level:  Can go up a flight of stairs and perform activities of daily living without stopping and without symptoms of chest pain or shortness of breath.    Anesthesia review:  CHF.  Hx of chest pain, palpitations, dyspnea.  HTN, DM  Patient denies shortness of breath, fever, cough and chest pain at PAT appointment (completed over the phone)  Patient verbalized understanding of instructions that were given to them at the PAT appointment. Patient was also instructed that they will need to review over the PAT instructions again at home before surgery.

## 2021-06-28 NOTE — Patient Instructions (Addendum)
DUE TO COVID-19 ONLY ONE VISITOR IS ALLOWED TO COME WITH YOU AND STAY IN THE WAITING ROOM ONLY DURING PRE OP AND PROCEDURE.   **NO VISITORS ARE ALLOWED IN THE SHORT STAY AREA OR RECOVERY ROOM!!**        Your procedure is scheduled on:  Wednesday, 07-04-21   Report to [facility] Main  Entrance     Report to admitting at 5:15 AM   Call this number if you have problems the morning of surgery [phone]   Do not eat food :After Midnight.   May have liquids until 4:30 AM day of surgery  CLEAR LIQUID DIET  Foods Allowed                                                                     Foods Excluded  Water, Black Coffee (no milk/no creamer) and tea, regular and decaf                              liquids that you cannot  Plain Jell-O in any flavor  (No red)                         see through such as: Fruit ices (not with fruit pulp)                                 milk, soups, orange juice  Iced Popsicles (No red)                                    All solid food                             Apple juices Sports drinks like Gatorade (No red) Lightly seasoned clear broth or consume(fat free) Sugar     Oral Hygiene is also important to reduce your risk of infection.                                    Remember - BRUSH YOUR TEETH THE MORNING OF SURGERY WITH YOUR REGULAR TOOTHPASTE   Do NOT smoke after Midnight   Take these medicines the morning of surgery with A SIP OF WATER: Zyrtec, Fenofibrate, Vascepa, Levothyroxine, Pravastatin, Lyrica.  Percocet if needed  How to Manage Your Diabetes Before and After Surgery  Why is it important to control my blood sugar before and after surgery? Improving blood sugar levels before and after surgery helps healing and can limit problems. A way of improving blood sugar control is eating a healthy diet by:  Eating less sugar and carbohydrates  Increasing activity/exercise  Talking with your doctor about reaching your blood sugar  goals High blood sugars (greater than 180 mg/dL) can raise your risk of infections and slow your recovery, so you will need to focus on controlling your diabetes during the weeks before surgery. Make sure that the doctor who takes care  of your diabetes knows about your planned surgery including the date and location.  How do I manage my blood sugar before surgery? Check your blood sugar at least 4 times a day, starting 2 days before surgery, to make sure that the level is not too high or low. Check your blood sugar the morning of your surgery when you wake up and every 2 hours until you get to the Short Stay unit. If your blood sugar is less than 70 mg/dL, you will need to treat for low blood sugar: Do not take insulin. Treat a low blood sugar (less than 70 mg/dL) with  cup of clear juice (cranberry or apple), 4 glucose tablets, OR glucose gel. Recheck blood sugar in 15 minutes after treatment (to make sure it is greater than 70 mg/dL). If your blood sugar is not greater than 70 mg/dL on recheck, call [phone] for further instructions. Report your blood sugar to the short stay nurse when you get to Short Stay.  If you are admitted to the hospital after surgery: Your blood sugar will be checked by the staff and you will probably be given insulin after surgery (instead of oral diabetes medicines) to make sure you have good blood sugar levels. The goal for blood sugar control after surgery is 80-180 mg/dL.   WHAT DO I DO ABOUT MY DIABETES MEDICATION?  Do not take oral diabetes medicines (pills) the morning of surgery.  THE DAY BEFORE SURGERY: Do not take Jardiance.    THE MORNING OF SURGERY: Do not take any diabetes medications.   Reviewed and Endorsed by [facility] Patient Education Committee, August 2015                     Aspirin - patient to stay on   Stop all vitamins and herbal supplements a week before surgery             You may not have any metal on your body including  hair pins, jewelry, and body piercing             Do not wear make-up, lotions, powders, perfumes or deodorant  Do not wear nail polish including gel and S&S, artificial/acrylic nails, or any other type of covering on natural nails including finger and toenails. If you have artificial nails, gel coating, etc. that needs to be removed by a nail salon please have this removed prior to surgery or surgery may need to be canceled/ delayed if the surgeon/ anesthesia feels like they are unable to be safely monitored.   Do not shave  48 hours prior to surgery.   Do not bring valuables to the hospital. [facility] IS NOT RESPONSIBLE FOR VALUABLES.   Contacts, dentures or bridgework may not be worn into surgery.    Patients discharged the day of surgery will not be allowed to drive home.  Please read over the following fact sheets you were given: IF YOU HAVE QUESTIONS ABOUT YOUR PRE OP INSTRUCTIONS PLEASE CALL [phone]   [facility] - Preparing for Surgery Before surgery, you can play an important role.  Because skin is not sterile, your skin needs to be as free of germs as possible.  You can reduce the number of germs on your skin by washing with CHG (chlorahexidine gluconate) soap before surgery.  CHG is an antiseptic cleaner which kills germs and bonds with the skin to continue killing germs even after washing. Please DO NOT use if you have an allergy to  CHG or antibacterial soaps.  If your skin becomes reddened/irritated stop using the CHG and inform your nurse when you arrive at Short Stay. Do not shave (including legs and underarms) for at least 48 hours prior to the first CHG shower.  You may shave your face/neck.  Please follow these instructions carefully:  1.  Shower with CHG Soap the night before surgery and the  morning of surgery.  2.  If you choose to wash your hair, wash your hair first as usual with your normal  shampoo.  3.  After you shampoo, rinse your hair and body thoroughly to  remove the shampoo.                             4.  Use CHG as you would any other liquid soap.  You can apply chg directly to the skin and wash.  Gently with a scrungie or clean washcloth.  5.  Apply the CHG Soap to your body ONLY FROM THE NECK DOWN.   Do   not use on face/ open                           Wound or open sores. Avoid contact with eyes, ears mouth and   genitals (private parts).                       Wash face,  Genitals (private parts) with your normal soap.             6.  Wash thoroughly, paying special attention to the area where your    surgery  will be performed.  7.  Thoroughly rinse your body with warm water from the neck down.  8.  DO NOT shower/wash with your normal soap after using and rinsing off the CHG Soap.                9.  Pat yourself dry with a clean towel.            10.  Wear clean pajamas.            11.  Place clean sheets on your bed the night of your first shower and do not  sleep with pets. Day of Surgery : Do not apply any lotions/deodorants the morning of surgery.  Please wear clean clothes to the hospital/surgery center.  FAILURE TO FOLLOW THESE INSTRUCTIONS MAY RESULT IN THE CANCELLATION OF YOUR SURGERY  PATIENT SIGNATURE_________________________________  NURSE SIGNATURE__________________________________  ________________________________________________________________________

## 2021-06-29 ENCOUNTER — Other Ambulatory Visit: Payer: Self-pay

## 2021-06-29 ENCOUNTER — Encounter (HOSPITAL_COMMUNITY): Payer: Self-pay

## 2021-06-29 ENCOUNTER — Encounter (HOSPITAL_COMMUNITY)
Admission: RE | Admit: 2021-06-29 | Discharge: 2021-06-29 | Disposition: A | Discharge status: Home / Self Care | Payer: Managed Care, Other (non HMO) | Source: Ambulatory Visit | Attending: Urology | Admitting: Urology

## 2021-06-29 VITALS — Ht 62.0 in | Wt 137.0 lb

## 2021-06-29 DIAGNOSIS — Z01818 Encounter for other preprocedural examination: Secondary | ICD-10-CM

## 2021-06-29 DIAGNOSIS — E119 Type 2 diabetes mellitus without complications: Secondary | ICD-10-CM

## 2021-06-29 HISTORY — DX: Hypothyroidism, unspecified: E03.9

## 2021-06-29 HISTORY — DX: Personal history of urinary calculi: Z87.442

## 2021-06-29 HISTORY — DX: Nausea with vomiting, unspecified: R11.2

## 2021-06-29 HISTORY — DX: Carrier or suspected carrier of methicillin resistant Staphylococcus aureus: Z22.322

## 2021-06-29 HISTORY — DX: Heart failure, unspecified: I50.9

## 2021-06-29 HISTORY — DX: Other specified postprocedural states: Z98.890

## 2021-06-29 HISTORY — DX: Essential (primary) hypertension: I10

## 2021-07-03 NOTE — Anesthesia Preprocedure Evaluation (Addendum)
Anesthesia Evaluation  Patient identified by MRN, date of birth, ID band Patient awake    Reviewed: Allergy & Precautions, NPO status , Patient's Chart, lab work & pertinent test results  History of Anesthesia Complications (+) PONV and history of anesthetic complications  Airway Mallampati: III  TM Distance: >3 FB Neck ROM: Full    Dental  (+) Dental Advisory Given   Pulmonary former smoker,    Pulmonary exam normal        Cardiovascular hypertension, (-) CHF Normal cardiovascular exam     Neuro/Psych  Headaches, negative psych ROS   GI/Hepatic Neg liver ROS, GERD  Controlled,  Endo/Other  diabetes (sequelae of covid, no longer on meds)Hypothyroidism   Renal/GU negative Renal ROS     Musculoskeletal negative musculoskeletal ROS (+)   Abdominal   Peds  Hematology negative hematology ROS (+)   Anesthesia Other Findings   Reproductive/Obstetrics                            Anesthesia Physical Anesthesia Plan  ASA: 2  Anesthesia Plan: General   Post-op Pain Management:    Induction: Intravenous  PONV Risk Score and Plan: 4 or greater and Treatment may vary due to age or medical condition, Ondansetron, Dexamethasone, Midazolam and Scopolamine patch - Pre-op  Airway Management Planned: LMA  Additional Equipment: None  Intra-op Plan:   Post-operative Plan: Extubation in OR  Informed Consent: I have reviewed the patients History and Physical, chart, labs and discussed the procedure including the risks, benefits and alternatives for the proposed anesthesia with the patient or authorized representative who has indicated his/her understanding and acceptance.     Dental advisory given  Plan Discussed with: CRNA and Anesthesiologist  Anesthesia Plan Comments:        Anesthesia Quick Evaluation

## 2021-07-04 ENCOUNTER — Encounter (HOSPITAL_COMMUNITY)
Admission: RE | Disposition: A | Discharge status: Home / Self Care | Payer: Self-pay | Source: Ambulatory Visit | Attending: Urology

## 2021-07-04 ENCOUNTER — Ambulatory Visit (HOSPITAL_COMMUNITY): Payer: Managed Care, Other (non HMO) | Admitting: Physician Assistant

## 2021-07-04 ENCOUNTER — Ambulatory Visit (HOSPITAL_COMMUNITY)
Admission: RE | Admit: 2021-07-04 | Discharge: 2021-07-04 | Disposition: A | Discharge status: Home / Self Care | Payer: Managed Care, Other (non HMO) | Source: Ambulatory Visit | Attending: Urology | Admitting: Urology

## 2021-07-04 ENCOUNTER — Ambulatory Visit (HOSPITAL_COMMUNITY): Payer: Managed Care, Other (non HMO) | Admitting: Registered Nurse

## 2021-07-04 ENCOUNTER — Encounter (HOSPITAL_COMMUNITY): Payer: Self-pay | Admitting: Urology

## 2021-07-04 ENCOUNTER — Ambulatory Visit (HOSPITAL_COMMUNITY): Payer: Managed Care, Other (non HMO)

## 2021-07-04 DIAGNOSIS — E119 Type 2 diabetes mellitus without complications: Secondary | ICD-10-CM | POA: Insufficient documentation

## 2021-07-04 DIAGNOSIS — G8929 Other chronic pain: Secondary | ICD-10-CM | POA: Insufficient documentation

## 2021-07-04 DIAGNOSIS — Z7984 Long term (current) use of oral hypoglycemic drugs: Secondary | ICD-10-CM | POA: Insufficient documentation

## 2021-07-04 DIAGNOSIS — K589 Irritable bowel syndrome without diarrhea: Secondary | ICD-10-CM | POA: Insufficient documentation

## 2021-07-04 DIAGNOSIS — U099 Post covid-19 condition, unspecified: Secondary | ICD-10-CM | POA: Diagnosis not present

## 2021-07-04 DIAGNOSIS — N132 Hydronephrosis with renal and ureteral calculous obstruction: Secondary | ICD-10-CM | POA: Insufficient documentation

## 2021-07-04 DIAGNOSIS — I10 Essential (primary) hypertension: Secondary | ICD-10-CM | POA: Diagnosis not present

## 2021-07-04 DIAGNOSIS — K219 Gastro-esophageal reflux disease without esophagitis: Secondary | ICD-10-CM | POA: Insufficient documentation

## 2021-07-04 DIAGNOSIS — Z01818 Encounter for other preprocedural examination: Secondary | ICD-10-CM

## 2021-07-04 DIAGNOSIS — Z87891 Personal history of nicotine dependence: Secondary | ICD-10-CM | POA: Insufficient documentation

## 2021-07-04 DIAGNOSIS — E039 Hypothyroidism, unspecified: Secondary | ICD-10-CM | POA: Insufficient documentation

## 2021-07-04 HISTORY — PX: CYSTOSCOPY WITH RETROGRADE PYELOGRAM, URETEROSCOPY AND STENT PLACEMENT: SHX5789

## 2021-07-04 LAB — SURGICAL PCR SCREEN
MRSA, PCR: NEGATIVE
Staphylococcus aureus: NEGATIVE

## 2021-07-04 LAB — GLUCOSE, CAPILLARY
Glucose-Capillary: 109 mg/dL — ABNORMAL HIGH (ref 70–99)
Glucose-Capillary: 97 mg/dL (ref 70–99)

## 2021-07-04 LAB — HEMOGLOBIN A1C
Hgb A1c MFr Bld: 5.1 % (ref 4.8–5.6)
Mean Plasma Glucose: 99.67 mg/dL

## 2021-07-04 SURGERY — CYSTOURETEROSCOPY, WITH RETROGRADE PYELOGRAM AND STENT INSERTION
Anesthesia: General | Site: Bladder | Laterality: Bilateral

## 2021-07-04 MED ORDER — SCOPOLAMINE 1 MG/3DAYS TD PT72
MEDICATED_PATCH | TRANSDERMAL | Status: AC
  Filled 2021-07-04: qty 1

## 2021-07-04 MED ORDER — PROPOFOL 10 MG/ML IV BOLUS
INTRAVENOUS | Status: AC
  Filled 2021-07-04: qty 20

## 2021-07-04 MED ORDER — DEXAMETHASONE SODIUM PHOSPHATE 10 MG/ML IJ SOLN
INTRAMUSCULAR | Status: AC
  Filled 2021-07-04: qty 1

## 2021-07-04 MED ORDER — LIDOCAINE HCL (PF) 2 % IJ SOLN
INTRAMUSCULAR | Status: AC
  Filled 2021-07-04: qty 5

## 2021-07-04 MED ORDER — KETOROLAC TROMETHAMINE 30 MG/ML IJ SOLN
INTRAMUSCULAR | Status: AC
  Filled 2021-07-04: qty 1

## 2021-07-04 MED ORDER — EPHEDRINE SULFATE-NACL 50-0.9 MG/10ML-% IV SOSY
PREFILLED_SYRINGE | INTRAVENOUS | Status: DC | PRN
  Administered 2021-07-04: 5 mg via INTRAVENOUS

## 2021-07-04 MED ORDER — KETOROLAC TROMETHAMINE 10 MG PO TABS: 10.0000 mg | ORAL_TABLET | Freq: Three times a day (TID) | ORAL | 0 refills | Status: DC | PRN

## 2021-07-04 MED ORDER — MIDAZOLAM HCL 2 MG/2ML IJ SOLN
INTRAMUSCULAR | Status: AC
  Filled 2021-07-04: qty 2

## 2021-07-04 MED ORDER — CHLORHEXIDINE GLUCONATE 0.12 % MT SOLN
15.0000 mL | Freq: Once | OROMUCOSAL | Status: AC
  Administered 2021-07-04: 15 mL via OROMUCOSAL

## 2021-07-04 MED ORDER — FENTANYL CITRATE PF 50 MCG/ML IJ SOSY
25.0000 ug | PREFILLED_SYRINGE | INTRAMUSCULAR | Status: DC | PRN
  Administered 2021-07-04 (×3): 50 ug via INTRAVENOUS

## 2021-07-04 MED ORDER — ONDANSETRON HCL 4 MG/2ML IJ SOLN
INTRAMUSCULAR | Status: AC
  Filled 2021-07-04: qty 2

## 2021-07-04 MED ORDER — LACTATED RINGERS IV SOLN: INTRAVENOUS | Status: DC

## 2021-07-04 MED ORDER — MIDAZOLAM HCL 5 MG/5ML IJ SOLN
INTRAMUSCULAR | Status: DC | PRN
  Administered 2021-07-04: 2 mg via INTRAVENOUS

## 2021-07-04 MED ORDER — FENTANYL CITRATE (PF) 100 MCG/2ML IJ SOLN
INTRAMUSCULAR | Status: AC
  Filled 2021-07-04: qty 2

## 2021-07-04 MED ORDER — DEXAMETHASONE SODIUM PHOSPHATE 10 MG/ML IJ SOLN
INTRAMUSCULAR | Status: DC | PRN
  Administered 2021-07-04: 4 mg via INTRAVENOUS

## 2021-07-04 MED ORDER — SCOPOLAMINE 1 MG/3DAYS TD PT72
MEDICATED_PATCH | TRANSDERMAL | Status: DC | PRN
  Administered 2021-07-04: 1 via TRANSDERMAL

## 2021-07-04 MED ORDER — FENTANYL CITRATE PF 50 MCG/ML IJ SOSY
PREFILLED_SYRINGE | INTRAMUSCULAR | Status: AC
  Filled 2021-07-04: qty 2

## 2021-07-04 MED ORDER — OXYCODONE-ACETAMINOPHEN 5-325 MG PO TABS: 1.0000 | ORAL_TABLET | Freq: Four times a day (QID) | ORAL | 0 refills | Status: DC | PRN

## 2021-07-04 MED ORDER — SODIUM CHLORIDE 0.9 % IR SOLN
Status: DC | PRN
  Administered 2021-07-04 (×2): 3000 mL via INTRAVESICAL

## 2021-07-04 MED ORDER — IOHEXOL 300 MG/ML  SOLN
INTRAMUSCULAR | Status: DC | PRN
  Administered 2021-07-04: 18 mL

## 2021-07-04 MED ORDER — FENTANYL CITRATE (PF) 100 MCG/2ML IJ SOLN
INTRAMUSCULAR | Status: DC | PRN
  Administered 2021-07-04 (×2): 25 ug via INTRAVENOUS

## 2021-07-04 MED ORDER — PHENYLEPHRINE 40 MCG/ML (10ML) SYRINGE FOR IV PUSH (FOR BLOOD PRESSURE SUPPORT)
PREFILLED_SYRINGE | INTRAVENOUS | Status: DC | PRN
  Administered 2021-07-04 (×3): 80 ug via INTRAVENOUS

## 2021-07-04 MED ORDER — PROMETHAZINE HCL 25 MG/ML IJ SOLN: 6.2500 mg | INTRAMUSCULAR | Status: DC | PRN

## 2021-07-04 MED ORDER — OXYCODONE HCL 5 MG PO TABS
5.0000 mg | ORAL_TABLET | Freq: Once | ORAL | Status: AC | PRN
  Administered 2021-07-04: 5 mg via ORAL

## 2021-07-04 MED ORDER — ORAL CARE MOUTH RINSE: 15.0000 mL | Freq: Once | OROMUCOSAL | Status: AC

## 2021-07-04 MED ORDER — ACETAMINOPHEN 500 MG PO TABS
1000.0000 mg | ORAL_TABLET | Freq: Once | ORAL | Status: AC
  Administered 2021-07-04: 1000 mg via ORAL
  Filled 2021-07-04: qty 2

## 2021-07-04 MED ORDER — GENTAMICIN SULFATE 40 MG/ML IJ SOLN
5.0000 mg/kg | INTRAVENOUS | Status: AC
  Administered 2021-07-04: 310 mg via INTRAVENOUS
  Filled 2021-07-04: qty 7.75

## 2021-07-04 MED ORDER — LIDOCAINE 2% (20 MG/ML) 5 ML SYRINGE
INTRAMUSCULAR | Status: DC | PRN
  Administered 2021-07-04: 60 mg via INTRAVENOUS

## 2021-07-04 MED ORDER — EPHEDRINE 5 MG/ML INJ
INTRAVENOUS | Status: AC
  Filled 2021-07-04: qty 5

## 2021-07-04 MED ORDER — OXYCODONE HCL 5 MG PO TABS
ORAL_TABLET | ORAL | Status: AC
  Filled 2021-07-04: qty 1

## 2021-07-04 MED ORDER — FENTANYL CITRATE PF 50 MCG/ML IJ SOSY
PREFILLED_SYRINGE | INTRAMUSCULAR | Status: AC
  Filled 2021-07-04: qty 1

## 2021-07-04 MED ORDER — KETOROLAC TROMETHAMINE 30 MG/ML IJ SOLN
INTRAMUSCULAR | Status: DC | PRN
  Administered 2021-07-04: 30 mg via INTRAVENOUS

## 2021-07-04 MED ORDER — PROPOFOL 10 MG/ML IV BOLUS
INTRAVENOUS | Status: DC | PRN
  Administered 2021-07-04: 170 mg via INTRAVENOUS

## 2021-07-04 MED ORDER — OXYCODONE HCL 5 MG/5ML PO SOLN: 5.0000 mg | Freq: Once | ORAL | Status: AC | PRN

## 2021-07-04 MED ORDER — ONDANSETRON HCL 4 MG/2ML IJ SOLN
INTRAMUSCULAR | Status: DC | PRN
  Administered 2021-07-04: 4 mg via INTRAVENOUS

## 2021-07-04 SURGICAL SUPPLY — 23 items
BAG URO CATCHER STRL LF (MISCELLANEOUS) ×3 IMPLANT
BASKET LASER NITINOL 1.9FR (BASKET) ×3 IMPLANT
CATH URETL OPEN END 6FR 70 (CATHETERS) ×3 IMPLANT
CLOTH BEACON ORANGE TIMEOUT ST (SAFETY) ×3 IMPLANT
EXTRACTOR STONE 1.7FRX115CM (UROLOGICAL SUPPLIES) IMPLANT
GLOVE SURG ENC TEXT LTX SZ7.5 (GLOVE) ×3 IMPLANT
GOWN STRL REUS W/TWL LRG LVL3 (GOWN DISPOSABLE) ×3 IMPLANT
GUIDEWIRE ANG ZIPWIRE 038X150 (WIRE) ×3 IMPLANT
GUIDEWIRE STR DUAL SENSOR (WIRE) ×3 IMPLANT
KIT TURNOVER KIT A (KITS) IMPLANT
LASER FIB FLEXIVA PULSE ID 365 (Laser) IMPLANT
LASER FIB FLEXIVA PULSE ID 550 (Laser) IMPLANT
LASER FIB FLEXIVA PULSE ID 910 (Laser) IMPLANT
MANIFOLD NEPTUNE II (INSTRUMENTS) ×3 IMPLANT
PACK CYSTO (CUSTOM PROCEDURE TRAY) ×3 IMPLANT
SHEATH NAVIGATOR HD 11/13X28 (SHEATH) ×3 IMPLANT
SHEATH NAVIGATOR HD 11/13X36 (SHEATH) IMPLANT
STENT POLARIS 5FRX22 (STENTS) ×6 IMPLANT
TRACTIP FLEXIVA PULS ID 200XHI (Laser) IMPLANT
TRACTIP FLEXIVA PULSE ID 200 (Laser)
TUBE FEEDING 8FR 16IN STR KANG (MISCELLANEOUS) ×3 IMPLANT
TUBING CONNECTING 10 (TUBING) ×3 IMPLANT
TUBING UROLOGY SET (TUBING) ×3 IMPLANT

## 2021-07-04 NOTE — Transfer of Care (Signed)
Immediate Anesthesia Transfer of Care Note  Patient: Caitlin Barnett  Procedure(s) Performed: CYSTOSCOPY WITH RETROGRADE PYELOGRAM, URETEROSCOPY, BASKETING OF STONES, AND STENT PLACEMENT (Bilateral: Bladder)  Patient Location: PACU  Anesthesia Type:General  Level of Consciousness: awake, alert , oriented and patient cooperative  Airway & Oxygen Therapy: Patient Spontanous Breathing and Patient connected to face mask oxygen  Post-op Assessment: Report given to RN, Post -op Vital signs reviewed and stable and Patient moving all extremities  Post vital signs: Reviewed and stable  Last Vitals:  Vitals Value Taken Time  BP 101/66 07/04/21 0809  Temp 36.4 C 07/04/21 0809  Pulse 90 07/04/21 0811  Resp 14 07/04/21 0811  SpO2 100 % 07/04/21 0811  Vitals shown include unvalidated device data.  Last Pain:  Vitals:   07/04/21 0809  TempSrc:   PainSc: Asleep      Patients Stated Pain Goal: 3 (07/04/21 0549)  Complications: No notable events documented.

## 2021-07-04 NOTE — Anesthesia Postprocedure Evaluation (Signed)
Anesthesia Post Note  Patient: Caitlin Barnett  Procedure(s) Performed: CYSTOSCOPY WITH RETROGRADE PYELOGRAM, URETEROSCOPY, BASKETING OF STONES, AND STENT PLACEMENT (Bilateral: Bladder)     Patient location during evaluation: PACU Anesthesia Type: General Level of consciousness: awake and alert Pain management: pain level controlled Vital Signs Assessment: post-procedure vital signs reviewed and stable Respiratory status: spontaneous breathing, nonlabored ventilation and respiratory function stable Cardiovascular status: stable and blood pressure returned to baseline Anesthetic complications: no   No notable events documented.  Last Vitals:  Vitals:   07/04/21 0845 07/04/21 0858  BP: 100/67 110/78  Pulse: 83 86  Resp: 10 19  Temp: (!) 36.3 C   SpO2: 96% 98%    Last Pain:  Vitals:   07/04/21 0858  TempSrc:   PainSc: 2                  Beryle Lathe

## 2021-07-04 NOTE — Discharge Instructions (Addendum)
1 - You may have urinary urgency (bladder spasms) and bloody urine on / off with stent in place. This is normal.  2 - Remove tethered stents on Friday morning at home by pulling on strings, then blue-white plastic tubing, and discarding.   3 - Call MD or go to ER for fever >102, severe pain / nausea / vomiting not relieved by medications, or acute change in medical status

## 2021-07-04 NOTE — H&P (Signed)
Caitlin Barnett is an 55 y.o. female.    Chief Complaint: Pre-Op BILATERAL Ureteroscopic Stone Manipulation  HPI:   1 - Urolithiasis - Rt 70mm distal UVJ stone with moderate hdyro and punctate left lower pole renal stones on ER CT 06/2021 on eval flank pain. NO fevers, UA withtou infectious parameters.   PMH sig for chornic pain / IBS / Narcotcis, DM2 (on Jardiance), hypothyroid. 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 her PCP is Ginger Carne MD.   Today " Caitlin Barnett" is seen to proceed with BILATERAL ureteroscopic stone manipulation. No interval fevers. Most recent UCX non-clonal.  Past Medical History:  Diagnosis Date   CHF (congestive heart failure) ([place])    COVID-19    Diabetes mellitus without complication (H[facility])    Diverticulitis    GERD (gastroesophageal reflux disease)    History of kidney stones    Hyperlipidemia    Hypertension    Hypothyroidism    IBS (irritable bowel syndrome)    Insomnia 12/10/2019   Kidney stone    Migraine    MRSA (methicillin resistant staph aureus) culture positive    Pneumonia 11/2017   PONV (postoperative nausea and vomiting)    Thyroid disease    UTI (urinary tract infection)     Past Surgical History:  Procedure Laterality Date   CESAREAN SECTION  1993   cystoscopies     multiple   FUNCTIONAL ENDOSCOPIC SINUS SURGERY  2003   LAPAROSCOPIC OVARIAN CYSTECTOMY  1983   TONSILLECTOMY  1973   TUBAL LIGATION     TYMPANOPLASTY W/ MASTOIDECTOMY Left 2016 & 2017   WISDOM TOOTH EXTRACTION      Family History  Problem Relation Age of Onset   Suicidality Mother    Depression Mother    Alcohol abuse Father    Kidney disease Father    Suicidality Father    Depression Father    Breast cancer Maternal Aunt    Heart disease Maternal Grandmother    Breast cancer Maternal Grandmother    Suicidality Maternal Grandfather    Stroke Paternal Grandmother    Heart disease Paternal Grandmother    Heart disease Paternal  Grandfather    Esophageal cancer Maternal Uncle    Colon cancer Neg Hx    Pancreatic cancer Neg Hx    Stomach cancer Neg Hx    Liver disease Neg Hx    Social History:  reports that she quit smoking about 3 years ago. Her smoking use included cigarettes. She has a 15.00 pack-year smoking history. She has never used smokeless tobacco. She reports that she does not drink alcohol and does not use drugs.  Allergies:  Allergies  Allergen Reactions   Tetanus Toxoids Other (See Comments)    Injection site abcess   Sulfa Antibiotics Hives    itching    Medications Prior to Admission  Medication Sig Dispense Refill   aspirin EC 81 MG tablet Take 81 mg by mouth daily.     cetirizine (ZYRTEC) 10 MG tablet Take 10 mg by mouth daily.     Cholecalciferol 1000 units capsule Take 5,000 Units by mouth 2 (two) times daily.      cyanocobalamin (,VITAMIN B-12,) 1000 MCG/ML injection Inject 1,000 mcg into the muscle every 30 (thirty) days.     fenofibrate (TRICOR) 145 MG tablet TAKE 1 TABLET DAILY FOR TRIGLYCERIDES (BLOOD FATS) (Patient taking differently: Take 145 mg by mouth daily.) 90 tablet 3   furosemide (LASIX) 40 MG  tablet Take 0.5-1 tablets (20-40 mg total) by mouth daily as needed. NEED OV. (Patient taking differently: Take 20 mg by mouth daily.) 30 tablet 0   icosapent Ethyl (VASCEPA) 1 g capsule Take  1 capsule 2 x /day for for Triglycerides (Blood Fats)  /      TAKE 1 CAPSULE BY MOUTH 2 TIMES DAILY. (Patient taking differently: Take 1 g by mouth 2 (two) times daily.) 180 capsule 3   JARDIANCE 25 MG TABS tablet TAKE 1 TABLET BY MOUTH DAILY FOR DIABETES (Patient taking differently: Take 25 mg by mouth daily.) 90 tablet 3   levothyroxine (SYNTHROID) 112 MCG tablet Take 112 mcg by mouth every morning.     Magnesium 250 MG TABS Take 250 mg by mouth daily.     meloxicam (MOBIC) 15 MG tablet TAKE 1/2 TO 1 TABLET DAILY WITH FOOD FOR PAIN & INFLAMMATION (Patient taking differently: Take 15 mg by mouth  daily.) 90 tablet 0   ondansetron (ZOFRAN ODT) 4 MG disintegrating tablet Take 1 tablet (4 mg total) by mouth every 6 (six) hours as needed for nausea or vomiting. 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     albuterol (PROVENTIL HFA;VENTOLIN HFA) 108 (90 Base) MCG/ACT inhaler 2 inhalations  15-20  minutes apart every 4 hours as needed to rescue Asthma (Patient taking differently: Inhale 2 puffs into the lungs every 4 (four) hours as needed for wheezing or shortness of breath (asthma). Inhale 15-20  minutes apart) 3 Inhaler 3   albuterol (PROVENTIL) (2.5 MG/3ML) 0.083% nebulizer solution Take 3 mLs (2.5 mg total) by nebulization every 6 (six) hours as needed for wheezing or shortness of breath. 75 mL 12   blood glucose meter kit and supplies KIT Dispense based on patient and insurance preference. Use up to four times daily as directed. (FOR ICD-9 250.00, 250.01). 1 each 0   butalbital-acetaminophen-caffeine (FIORICET) 50-325-40 MG tablet Take 0.5 tablets by mouth every 4 (four) hours as needed for headache. 20 tablet 0   cephALEXin (KEFLEX) 250 MG capsule Take 1 tab after intercourse as needed to prevent recurrent UTI. (Patient not taking: Reported on 06/24/2021) 30 capsule 0   cyclobenzaprine (FLEXERIL) 5 MG tablet Take 1  tablet (5 mg total) by mouth 3 (three) times daily as needed for muscle spasms. 60 tablet 0   glucose blood test strip Test blood sugar once daily 100 each 12   HYDROcodone-acetaminophen (NORCO/VICODIN) 5-325 MG tablet Take 1 tablet by mouth every 6 (six) hours as needed. 10 tablet 0   levofloxacin (LEVAQUIN) 500 MG tablet Take  1 tablet  now & repeat at Bedtime today, then tomorrow Start  1 tablet  Daily  for UTI (Patient not taking: Reported on 06/24/2021) 20 tablet 0   levothyroxine (SYNTHROID) 125 MCG tablet TAKE 1 TAB DAILY ON AN EMPTY STOMACH WITH ONLY WATER FOR 30 MINUTES & NO ANTACID MEDS, CALCIUM OR MAGNESIUM FOR 4 HOURS & AVOID BIOTIN (Patient not taking: Reported on 06/24/2021) 90 tablet 3   OZEMPIC, 0.25 OR 0.5 MG/DOSE, 2 MG/1.5ML SOPN Inject 0.5 mg into the skin once a week.     phenazopyridine (PYRIDIUM) 200 MG tablet Take 1 tab up to three times a day as needed for bladder pain. (Patient taking differently: Take 200 mg by mouth daily as needed for pain.) 30 tablet 0   potassium chloride (KLOR-CON) 10 MEQ tablet TAKE 1 TABLET BY MOUTH EVERY DAY (Patient taking differently: Take 10 mEq by mouth daily as needed (low potassium).) 90 tablet 3   topiramate (TOPAMAX) 50 MG tablet Take 1/2 to 1 tablet 2 x /day at Suppertime & Bedtime for Dieting & Weight Loss (Patient not taking: Reported on 06/24/2021) 180 tablet 1   traMADol (ULTRAM) 50 MG tablet Take     1 to 2 tablets      every 4 hours if needed    for Severe Pain (Patient not taking: Reported on 06/24/2021) 30 tablet 0   traZODone (DESYREL) 150 MG tablet Take 1/2 to 1 tablet 1 hour before Bedtime if needed for Sleep (icd 10  Dx:  g47.00) (Patient taking differently: Take 150 mg by mouth at bedtime as needed for sleep. Take 1/2 to 1 tablet 1 hour before Bedtime if needed for Sleep (icd 10  Dx:  g47.00))  90 tablet 3   valACYclovir (VALTREX) 1000 MG tablet Take 1 tablet 3 x /day for Fever Blisters / Cold Sores (Patient taking differently: Take 1,000 mg by mouth daily as needed (fever blisters).) 270 tablet 0    Results for orders placed or performed during the hospital encounter of 07/04/21 (from the past 48 hour(s))  Glucose, capillary     Status: Abnormal   Collection Time: 07/04/21  5:59 AM  Result Value Ref Range   Glucose-Capillary 109 (H) 70 - 99 mg/dL    Comment: Glucose reference range applies only to samples taken after fasting for at least 8 hours.   No results found.  Review of Systems  Constitutional:  Negative for chills and fever.  Genitourinary:  Positive for flank pain.  All other systems reviewed and are negative.  Blood pressure 92/70, pulse 72, temperature 98.6 F (37 C), temperature source Oral, resp. rate 16, height $RemoveBe'5\' 2"'lULkZVfQx$  (1.575 m), weight 62.1 kg, SpO2 100 %. Physical Exam Vitals reviewed.  HENT:     Head: Normocephalic.     Nose: Nose normal.  Eyes:     Pupils: Pupils are equal, round, and reactive to light.  Cardiovascular:     Rate and Rhythm: Normal rate.  Pulmonary:     Effort: Pulmonary effort is normal.  Abdominal:     General: Abdomen is flat.  Genitourinary:    Comments: No CVAT Musculoskeletal:        General: Normal range of motion.     Cervical back: Normal range of motion.  Skin:    General: Skin is warm.  Neurological:     General: No focal deficit present.     Mental Status: She is alert.  Psychiatric:        Mood and Affect: Mood normal.  Assessment/Plan  Proceed as planned with BILATERAL ureteroscopy with goal of stone free. Risks, benefits, alternatives, expected peri-op course discussed previously and reiterated today.   Alexis Frock, MD 07/04/2021, 6:30 AM

## 2021-07-04 NOTE — Brief Op Note (Signed)
07/04/2021  8:00 AM  PATIENT:  Caitlin Barnett  55 y.o. female  PRE-OPERATIVE DIAGNOSIS:  RIGHT URETERAL, LEFT RENAL STONES  POST-OPERATIVE DIAGNOSIS:  RIGHT URETERAL, LEFT RENAL STONES  PROCEDURE:  Procedure(s): CYSTOSCOPY WITH RETROGRADE PYELOGRAM, URETEROSCOPY, BASKETING OF STONES, AND STENT PLACEMENT (Bilateral)  SURGEON:  Surgeon(s) and Role:    * Sebastian Ache, MD - Primary  PHYSICIAN ASSISTANT:   ASSISTANTS: none   ANESTHESIA:   general  EBL:  minimal   BLOOD ADMINISTERED:none  DRAINS: none   LOCAL MEDICATIONS USED:  NONE  SPECIMEN:  Source of Specimen:  Rt ureteral, Lt renal stone fragments  DISPOSITION OF SPECIMEN:   [facility] for compositional analysis  COUNTS:  YES  TOURNIQUET:  * No tourniquets in log *  DICTATION: .Other Dictation: Dictation Number [id number]  PLAN OF CARE: Discharge to home after PACU  PATIENT DISPOSITION:  PACU - hemodynamically stable.   Delay start of Pharmacological VTE agent (>24hrs) due to surgical blood loss or risk of bleeding: yes

## 2021-07-04 NOTE — Anesthesia Procedure Notes (Signed)
Procedure Name: LMA Insertion Date/Time: 07/04/2021 7:34 AM Performed by: Elisabeth Cara, CRNA Pre-anesthesia Checklist: Patient identified, Emergency Drugs available, Suction available, Patient being monitored and Timeout performed Patient Re-evaluated:Patient Re-evaluated prior to induction Oxygen Delivery Method: Circle system utilized Preoxygenation: Pre-oxygenation with 100% oxygen Induction Type: IV induction LMA: LMA with gastric port inserted LMA Size: 4.0 Number of attempts: 1 Placement Confirmation: positive ETCO2 and breath sounds checked- equal and bilateral Tube secured with: Tape Dental Injury: Teeth and Oropharynx as per pre-operative assessment

## 2021-07-06 ENCOUNTER — Encounter (HOSPITAL_COMMUNITY): Payer: Self-pay | Admitting: Urology

## 2021-07-09 NOTE — Op Note (Signed)
Caitlin Barnett, Caitlin Barnett MEDICAL RECORD NO: [id number] ACCOUNT NO: [id number] DATE OF BIRTH: 01-06-1966 FACILITY: [facility] LOCATION: [facility] PHYSICIAN: Sebastian Ache, MD  Operative Report   DATE OF PROCEDURE: 07/04/2021  PREOPERATIVE DIAGNOSES:  Right ureteral and left renal stones.  PROCEDURE PERFORMED:  1.  Cystoscopy with bilateral retrograde pyelograms interpretation. 2.  Bilateral ureteroscopy with basketing of stones. 3.  Insertion of bilateral ureteral stents, 5 x 22 Polaris with tether.  ESTIMATED BLOOD LOSS:  Nil.  COMPLICATIONS:  None.  SPECIMENS:  Right ureteral and left renal stone fragments for composition analysis.  FINDINGS:  1.  Right distal ureteral stone, mild hydronephrosis. 2.  Very small left papillary tip renal calcifications. 3.  Complete removal of all accessible stone fragments larger than one-third mm following basket extraction. 4.  Successful placement of bilateral ureteral stents, proximal end in the renal pelvis, distal end in the urinary bladder.  INDICATIONS FOR PROCEDURE:  The patient is a pleasant 55 year old retired Engineer, civil (consulting) who was found on workup of colicky flank pain to have a right distal ureteral stone, approximately 3 mm.  She was appropriately placed on medical therapy; however, she failed  to pass stone and symptoms remained quite problematic.  She also has some very small punctate left intrarenal stones.  Options were discussed for management including continued medical therapy versus shockwave lithotripsy versus ureteroscopy and she  wished to proceed with bilateral ureteroscopy with goal of stone free.  Informed consent was obtained and placed in medical record.  DESCRIPTION OF PROCEDURE:  The patient being verified, procedure being bilateral ureteroscopic stone manipulation confirmed.  Procedure timeout was performed.  Intravenous antibiotics were administered and general LMA anesthesia induced.  The patient was  placed into a low lithotomy  position.  A sterile field was created, prepped and draped the patient's vagina, introitus and proximal thigh using iodine.  Cystourethroscopy was performed using 21-French rigid cystoscope with offset lens.  Inspection of  urinary bladder revealed no diverticula, calcifications, papillary lesions.  The left ureteral orifice was cannulated with a 6-French end-hole catheter and left retrograde pyelogram was obtained.  Left retrograde pyelogram demonstrated a single left ureter, single system left kidney.  No filling defects or narrowing noted.  A ZIPwire was advanced and set aside as a safety wire.  Next, right retrograde pyelogram was obtained.  Right retrograde pyelogram demonstrated a single right ureter, single system right kidney.  There was minimal hydronephrosis, no obvious filling defects.  The ZIPwire was advanced to the level of upper pole, set aside as safety wire.  An 8-French feeding  tube placed in the urinary bladder for pressure release.  Semi-rigid ureteroscopy was performed of distal right ureter, alongside a separate sensor working wire.  Just above the intramural ureter, there was a small stone as anticipated.  This was  amenable to basketing.  It was grasped on its long axis and removed, set aside for composition analysis.  Notably, this was the only stone on the right side on imaging.  Next, semirigid ureteroscopy was performed of the entire length of the left ureter  alongside a separate sensor working wire. No mucosal abnormalities were found.  Semi-rigid scope was exchanged for an 11/13 short length ureteral access sheath to the level of the proximal ureter.  Using continuous fluoroscopic guidance, a flexible  digital ureteroscopy was performed in the proximal left ureter and systematic inspection of the left kidney including all calices x3.  There was multifocal, very small volume papillary tip calcifications, mostly in the upper mid  calix.  These were  amenable to dislodging with  the escape basket and flushing from the kidney.  They were punctate.  Access sheath was removed under continuous vision.  No significant mucosal abnormalities were found.  Given the bilateral nature of the procedure, it was  felt that brief interval stenting with tethered stents would be most prudent.  As such, a new 5 x 22 Polaris type stents were placed over remaining safety wires using fluoroscopic guidance.  Good proximal and distal planes were noted.  Tethers were left  in place and fashioned together and tucked per vagina.  The procedure was terminated.  The patient tolerated the procedure well and no immediate perioperative complications.  The patient taken to postanesthesia care in stable condition.   SHW D: 07/04/2021 8:05:27 am T: 07/04/2021 9:09:00 am  JOB: [id number]/ [id number]

## 2021-07-14 ENCOUNTER — Other Ambulatory Visit: Payer: Self-pay | Admitting: Internal Medicine

## 2021-07-14 DIAGNOSIS — E1140 Type 2 diabetes mellitus with diabetic neuropathy, unspecified: Secondary | ICD-10-CM

## 2021-07-18 ENCOUNTER — Encounter: Payer: Self-pay | Admitting: Internal Medicine

## 2021-07-18 ENCOUNTER — Other Ambulatory Visit: Payer: Self-pay

## 2021-07-18 ENCOUNTER — Ambulatory Visit (INDEPENDENT_AMBULATORY_CARE_PROVIDER_SITE_OTHER): Payer: Managed Care, Other (non HMO) | Admitting: Internal Medicine

## 2021-07-18 VITALS — BP 90/75 | HR 109 | Temp 98.0°F | Resp 18 | Ht 63.0 in | Wt 139.0 lb

## 2021-07-18 DIAGNOSIS — J208 Acute bronchitis due to other specified organisms: Secondary | ICD-10-CM | POA: Diagnosis not present

## 2021-07-18 DIAGNOSIS — E6609 Other obesity due to excess calories: Secondary | ICD-10-CM | POA: Diagnosis not present

## 2021-07-18 DIAGNOSIS — Z6831 Body mass index (BMI) 31.0-31.9, adult: Secondary | ICD-10-CM

## 2021-07-18 DIAGNOSIS — U071 COVID-19: Secondary | ICD-10-CM | POA: Diagnosis not present

## 2021-07-18 DIAGNOSIS — G4483 Primary cough headache: Secondary | ICD-10-CM

## 2021-07-18 DIAGNOSIS — R059 Cough, unspecified: Secondary | ICD-10-CM

## 2021-07-18 LAB — POC COVID19 BINAXNOW: SARS Coronavirus 2 Ag: POSITIVE — AB

## 2021-07-18 LAB — POC INFLUENZA A&B (BINAX/QUICKVUE)
Influenza A, POC: NEGATIVE
Influenza B, POC: NEGATIVE

## 2021-07-18 MED ORDER — BENZONATATE 200 MG PO CAPS: ORAL_CAPSULE | ORAL | 1 refills | Status: DC

## 2021-07-18 MED ORDER — DEXAMETHASONE 4 MG PO TABS: ORAL_TABLET | ORAL | 1 refills | Status: DC

## 2021-07-18 MED ORDER — PHENTERMINE HCL 37.5 MG PO TABS: ORAL_TABLET | ORAL | 1 refills | Status: DC

## 2021-07-18 MED ORDER — TRAMADOL HCL 50 MG PO TABS: ORAL_TABLET | ORAL | 0 refills | Status: DC

## 2021-07-18 NOTE — Progress Notes (Signed)
Future Appointments  Date Time Provider Department  07/18/2021  9:30 AM [name], MD [facility]  11/07/2021 10:00 AM [name], MD [facility]    History of Present Illness:                                                      This very nice 55 y.o. MWF   with HTN, HLD, Hypothyroidism, T2_NIDDM and Vitamin D Deficiency who presents with sinus congestion, Sore throat,. Fever /chills, aches. Tested (-) for Flu, but (+) Covid. In December 2020, she was hospitalized with Covid and fortunately didn't require ventilation,  but post hospital was on insulin consequent of her high dose steroid treatment. She had since slowly tapered off of the insulin. She reports her current O2 sat's are in the mid 90's.   Medications Current Outpatient Medications (Endocrine & Metabolic):    JARDIANCE 25 MG TABS tablet, TAKE 1 TABLET  DAILY FOR DIABETES (Patient taking differently: Take 25 mg  daily.)   levothyroxine (SYNTHROID) 112 MCG tablet, Take 112 mcg very morning.   OZEMPIC, 0.25 OR 0.5 MG/DOSE, 2 MG/1.5ML SOPN, Inject 0.5 mg into the skin once a week.   PREMPRO 0.45-1.5 MG tablet, TAKE 1 TABLET EVERY DAY    fenofibrate (TRICOR) 145 MG tablet, TAKE 1 TABLET DAILY FOR TRIGLYCERIDES (BLOOD FATS) (Patient taking differently: Take 145 mg daily.)   furosemide (LASIX) 40 MG tablet, Take 0.5-1 tablets (20-40 mg total) daily as needed. NEED OV. (Patient taking differently: Take 20 mg by mouth daily.)   icosapent Ethyl (VASCEPA) 1 g capsule, Take  1 capsule 2 x /day for for Triglycerides (Blood Fats)  /      TAKE 1 CAPSULE 2 TIMES DAILY. (Patient taking differently: Take 1 g  2  times daily.)   pravastatin (PRAVACHOL) 20 MG tablet, TAKE 1 TABLET EVERYDAY AT BEDTIME (Patient taking differently: Take 20 mg  daily.)   albuterol (PROVENTIL HFA;VENTOLIN HFA) 108 (90 Base) MCG/ACT inhaler, 2 inhalations  15-20  minutes apart every 4 hours as needed to rescue Asthma (Patient taking differently: Inhale 2 puffs  into the lungs every 4 (four) hours as needed for wheezing or shortness of breath (asthma). Inhale 15-20  minutes apart)   albuterol (PROVENTIL) (2.5 MG/3ML) 0.083% nebulizer solution, Take 3 mLs (2.5 mg total) by nebulization every 6 (six) hours as needed for wheezing or shortness of breath.   cetirizine (ZYRTEC) 10 MG tablet, Take 10 mg by mouth daily.  Current Outpatient Medications (Analgesics):    butalbital-acetaminophen-caffeine (FIORICET) 50-325-40 MG tablet, Take 0.5 tablets by mouth every 4 (four) hours as needed for headache.   ketorolac (TORADOL) 10 MG tablet, Take 1 tablet (10 mg total) by mouth every 8 (eight) hours as needed for moderate pain. Or stent discomfort post-operatively.   oxyCODONE-acetaminophen (PERCOCET) 5-325 MG tablet, Take 1 tablet by mouth every 6 (six) hours as needed for severe pain (post-operatively).   cyanocobalamin (,VITAMIN B-12,) 1000 MCG/ML injection, Inject 1,000 mcg into the muscle every 30 (thirty) days.   Cholecalciferol 1000 units capsule, Take 5,000 Units 2 (two) times daily.    cyclobenzaprine (FLEXERIL) 5 MG tablet, Take 1 table times daily as needed for muscle spasms.   glucose blood test strip, Test blood sugar once daily   Magnesium 250 MG TABS, Take 250 mg by mouth daily.  ondansetron (ZOFRAN ODT) 4 MG disintegrating tablet, Take 1 tablet (4 mg total) by mouth every 6 (six) hours as needed for nausea or vomiting.   phenazopyridine (PYRIDIUM) 200 MG tablet, Take 1 tab up to three times a day as needed for bladder pain. (Patient taking differently: Take 200 mg by mouth daily as needed for pain.)   phentermine (ADIPEX-P) 37.5 MG tablet, Take 1/2 to 1 tablet every Morning for Dieting & Weight Loss (Patient taking differently: Take 18.75 mg by mouth daily before breakfast.)   potassium chloride (KLOR-CON) 10 MEQ tablet, TAKE 1 TABLET daoly (Dx: e11.40)   traZODone (DESYREL) 150 MG tablet, Take 1/2 to 1 tablet 1 hour before Bedtime if needed for Sleep  (icd 10  Dx:  g47.00) (Patient taking differently: Take 150 mg at bedtime as needed for sleep. Take 1/2 to 1 tablet 1 hour before Bedtime if needed for Sleep (icd 10  Dx:  g47.00))   valACYclovir (VALTREX) 1000 MG tablet, Take 1 tablet 3 x /day for Fever Blisters / Cold Sores (Patient taking differently: Take 1,000 mg \\ daily as needed (fever blisters).)   zinc gluconate 50 MG tablet, Take 50 mg \ daily.  Problem list She has Hypothyroidism; Type 2 diabetes mellitus (HCC); Vitamin D deficiency; Postmenopausal; Hot flashes due to menopause; Vitamin B12 deficiency; Peripheral neuropathy; Hyperlipidemia associated with type 2 diabetes mellitus (HCC); GERD (gastroesophageal reflux disease); Obesity (BMI 30.0-34.9); Bilateral lower extremity edema; Chronic diastolic (congestive) heart failure (HCC); Hepatic steatosis; Chronic suppurative otitis media of left ear; Fatigue; Former smoker; FHx: heart disease; Class 1 obesity due to excess calories with serious comorbidity and body mass index (BMI) of 31.0 to 31.9 in adult; Hypertension; Insomnia; and Dyspnea on minimal exertion on their problem list.   Observations/Objective:  BP 90/75   Pulse (!) 109   Temp 98 F (36.7 C)   Resp 18   Ht 5\' 3"  (1.6 m)   Wt 139 lb (63 kg)   SpO2 97%   BMI 24.62 kg/m   No Rash, cyanosis or Icterus. No cough or Stridor w/cough . No Wheezes.  Cor - Nl HS. RRR w/o sig MGR. PP 1(+). No edema. MS- FROM w/o deformities.  Gait Nl. Neuro -  Nl w/o focal abnormalities.  Assessment and Plan:  1. Acute bronchitis due to COVID-19 virus   2. Cough headache  - POC COVID-19 - POC Influenza A&B (Binax test)  3. Cough  - benzonatate (TESSALON) 200 MG capsule; Take  1 perle  3 x /day  to Prevent Cough  Dispense: 90 capsule; Refill: 1  4. Class 1 obesity due to excess calories with serious comorbidity and body mass index (BMI) of 31.0 to 31.9 in ad  - phentermine (ADIPEX-P) 37.5 MG tablet; Take 1 tablet every morning  for Dieting & Weight  Loss  Dispense: 90 tablet; Refill: 1   Discussed 5 day quarantine, then if afebrile  mask.   Follow Up Instructions:        I discussed the assessment and treatment plan with the patient. The patient was provided an opportunity to ask questions and all were answered. The patient agreed with the plan and demonstrated an understanding of the instructions.       The patient was advised to call back or seek an in-person evaluation if the symptoms worsen or if the condition fails to improve as anticipated.    , MD

## 2021-07-19 ENCOUNTER — Encounter: Payer: Self-pay | Admitting: Internal Medicine

## 2021-07-22 ENCOUNTER — Other Ambulatory Visit: Payer: Self-pay | Admitting: Internal Medicine

## 2021-07-23 ENCOUNTER — Other Ambulatory Visit: Payer: Self-pay | Admitting: Adult Health

## 2021-07-24 ENCOUNTER — Other Ambulatory Visit: Payer: Self-pay | Admitting: Internal Medicine

## 2021-07-24 ENCOUNTER — Encounter: Payer: Self-pay | Admitting: Internal Medicine

## 2021-07-24 DIAGNOSIS — U071 COVID-19: Secondary | ICD-10-CM

## 2021-07-24 DIAGNOSIS — R079 Chest pain, unspecified: Secondary | ICD-10-CM

## 2021-07-24 DIAGNOSIS — J988 Other specified respiratory disorders: Secondary | ICD-10-CM

## 2021-07-24 MED ORDER — ESOMEPRAZOLE MAGNESIUM 40 MG PO CPDR: DELAYED_RELEASE_CAPSULE | ORAL | 1 refills | Status: DC

## 2021-07-25 ENCOUNTER — Ambulatory Visit (HOSPITAL_COMMUNITY)
Admission: RE | Admit: 2021-07-25 | Discharge: 2021-07-25 | Disposition: A | Discharge status: Home / Self Care | Payer: Managed Care, Other (non HMO) | Source: Ambulatory Visit | Attending: Internal Medicine | Admitting: Internal Medicine

## 2021-07-25 ENCOUNTER — Other Ambulatory Visit: Payer: Self-pay

## 2021-07-25 DIAGNOSIS — R079 Chest pain, unspecified: Secondary | ICD-10-CM | POA: Diagnosis not present

## 2021-07-25 DIAGNOSIS — J988 Other specified respiratory disorders: Secondary | ICD-10-CM | POA: Insufficient documentation

## 2021-07-25 DIAGNOSIS — U071 COVID-19: Secondary | ICD-10-CM | POA: Insufficient documentation

## 2021-07-26 NOTE — Progress Notes (Signed)
============================================================ °============================================================ ° °-    CXR - OK No pneumonia, cancer or heart failure & no sign of rib fx's.  ============================================================ ============================================================

## 2021-08-01 ENCOUNTER — Encounter: Payer: Self-pay | Admitting: Internal Medicine

## 2021-08-06 ENCOUNTER — Other Ambulatory Visit: Payer: Self-pay | Admitting: Adult Health

## 2021-08-06 DIAGNOSIS — E1140 Type 2 diabetes mellitus with diabetic neuropathy, unspecified: Secondary | ICD-10-CM

## 2021-09-09 ENCOUNTER — Other Ambulatory Visit: Payer: Self-pay | Admitting: Adult Health

## 2021-09-09 ENCOUNTER — Other Ambulatory Visit: Payer: Self-pay | Admitting: Gastroenterology

## 2021-09-09 DIAGNOSIS — K219 Gastro-esophageal reflux disease without esophagitis: Secondary | ICD-10-CM

## 2021-09-09 DIAGNOSIS — E1140 Type 2 diabetes mellitus with diabetic neuropathy, unspecified: Secondary | ICD-10-CM

## 2021-09-26 NOTE — Progress Notes (Signed)
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 Patient admits increased anxiety after confrontational employee meetings .  She has not checked but feels that her BP had been elevated.  She does still monitor CBG's.  She desires to restart Lexapro which she feels had been beneficial in the past in dealing with job stresses.                                                                                                                                                                                                                                                        Levothyroxine 112 MCG tablet, Take  every morning.   OZEMPIC, 0.25 OR 0.5 MG/DOSE, 2 MG/1.5ML SOPN, Inject 0.5 mg into the skin once a week.   PREMPRO 0.45-1.5 MG tablet, TAKE 1 TABLET BY MOUTH EVERY DAY (Patient taking differently: Take 1 tablet by mouth daily.)   fenofibrate 145 MG tablet, TAKE 1 TABLET DAILY    furosemide 40 MG tablet, Take 0.5-1 tablets / daily    VASCEPA 1 g capsule, Take  1 capsule 2 x /day   pravastatin 20 MG tablet, TAKE 1 TABLET EVERYDAY    albuterol  HFA inhaler, 2 inhalations  every 4 hours as needed to rescue Asthma    albuterol  (2.5 MG/3ML) 0.083% nebulizer soln, Take 3 mLs by neb every 6 (six) hours as needed    cetirizine  10 MG tablet, Take 10 mg by mouth daily.   FIORICET, Take 0.5 tablets  every 4  hours as needed   ketorolac (TORADOL) 10 MG tablet, Take 1 tablet  every 8 hours as needed   traMADol 50 MG tablet, Take  1/2 to 1 tablet   every 4 to 6 hours   as needed for Severe Pain   VITAMIN B-12 1000 MCG/ML inj, Inject 1,000 mcg into the muscle every 30  days.   Cholecalciferol, Take 5,000 Units  2 times daily.    glucose blood test strip,  Test blood sugar once daily   Magnesium 250 MG TABS, Take daily.   omeprazole  40 MG capsule, TAKE 1 CAPSULE EVERY DAY   ondansetron ODT) 4 MG disintegrating tablet, Take 1 tablet  every 6  hours as needed    phenazopyridine  200 MG tablet, Take 1 tab up to three times a day as needed    phentermine 37.5 MG , Take 1 tablet every morning    potassium chloride 10 MEQ tablet, TAKE 1 TABLET EVERY DAY   pregabalin 200 MG capsule, Take  1 capsule  3 x /day for Painful Neuropathy (Dx: e11.40)    traZODone  150 MG tablet, Take 1/2 to 1 tablet 1 hour before Bedtime if needed  (icd 10  (Dx:  g47.00))    valACYclovir 1000 MG tablet, Take 1 tablet 3 x /day    zinc 50 MG tablet, Take  daily.  Problem list She has Hypothyroidism; Type 2 diabetes mellitus ([place]); Vitamin D deficiency; Postmenopausal; Hot flashes due to menopause; Vitamin B12 deficiency; Peripheral neuropathy; Hyperlipidemia associated with type 2 diabetes mellitus ([place]); GERD (gastroesophageal reflux disease); Obesity (BMI 30.0-34.9); Bilateral lower extremity edema; Chronic diastolic (congestive) heart failure ([place]); Hepatic steatosis; Chronic suppurative otitis media of left ear; Fatigue; Former smoker; FHx: heart disease; Class 1 obesity due to excess calories with serious comorbidity and body mass index (BMI) of 31.0 to 31.9 in adult; Hypertension; Insomnia; and Dyspnea on minimal exertion on their problem list.   Observations/Objective:  There were no vitals taken for this visit.  HEENT - WNL. Neck - supple.  Chest - Clear equal BS. Cor - Nl HS. RRR w/o sig MGR. PP 1(+). No edema. MS- FROM w/o deformities.  Gait Nl. Neuro -  Nl w/o focal abnormalities.   Assessment and Plan:  1. Labile hypertension   2. Hyperlipidemia associated with type 2 diabetes mellitus ([place])   3. Type 2 diabetes mellitus with diabetic neuropathy,                         without long-term current use of insulin (HCC)    Follow Up Instructions:         Discussed with patient restarting Lexapro & the 2-4 week latency before maximal benefit. Will also Rx low dose Alprazolam to use only during that Lexapro restart period. Recommended ROV 2 weeks to review.                                                   I discussed the assessment and treatment plan with the patient. The patient was provided an opportunity to ask questions and all were answered. The patient agreed with the plan and demonstrated an understanding of the instructions.       The patient was advised to call back or seek an in-person evaluation if the symptoms worsen or if the condition fails to improve as anticipated.    Kirtland Bouchard, MD

## 2021-09-27 ENCOUNTER — Encounter: Payer: Self-pay | Admitting: Internal Medicine

## 2021-09-27 ENCOUNTER — Ambulatory Visit (INDEPENDENT_AMBULATORY_CARE_PROVIDER_SITE_OTHER): Payer: Managed Care, Other (non HMO) | Admitting: Internal Medicine

## 2021-09-27 ENCOUNTER — Other Ambulatory Visit: Payer: Self-pay

## 2021-09-27 VITALS — BP 118/76 | HR 88 | Temp 97.9°F | Resp 16 | Ht 63.0 in | Wt 139.6 lb

## 2021-09-27 DIAGNOSIS — E785 Hyperlipidemia, unspecified: Secondary | ICD-10-CM

## 2021-09-27 DIAGNOSIS — R0989 Other specified symptoms and signs involving the circulatory and respiratory systems: Secondary | ICD-10-CM

## 2021-09-27 DIAGNOSIS — E1140 Type 2 diabetes mellitus with diabetic neuropathy, unspecified: Secondary | ICD-10-CM | POA: Diagnosis not present

## 2021-09-27 DIAGNOSIS — E1169 Type 2 diabetes mellitus with other specified complication: Secondary | ICD-10-CM | POA: Diagnosis not present

## 2021-09-27 MED ORDER — ALPRAZOLAM 0.5 MG PO TABS: ORAL_TABLET | ORAL | 0 refills | Status: DC

## 2021-09-27 MED ORDER — ESCITALOPRAM OXALATE 20 MG PO TABS: ORAL_TABLET | ORAL | 3 refills | Status: DC

## 2021-09-28 ENCOUNTER — Other Ambulatory Visit: Payer: Self-pay | Admitting: Adult Health

## 2021-11-06 ENCOUNTER — Encounter: Payer: Self-pay | Admitting: Internal Medicine

## 2021-11-06 NOTE — Patient Instructions (Signed)

## 2021-11-06 NOTE — Progress Notes (Signed)
? ?Annual Screening/Preventative Visit ?& Comprehensive Evaluation &  Examination ? ?Future Appointments  ?Date Time Provider Department  ?11/07/2021 10:00 AM Unk Pinto, MD [facility]  ?11/14/2022 10:00 AM Unk Pinto, MD [facility]  ? ? ?    This very nice 56 y.o. MWF presents for a Screening /Preventative Visit & comprehensive evaluation and management of multiple medical co-morbidities.  Patient has been followed for HTN, HLD, T2_NIDDM  Prediabetes  and Vitamin D Deficiency.  ? ?                                               Patient was hospitalized in Dec 2020 with Covid-19 PNA & treated also with steroids causing exacerbation of her DM requiring insulin  (later tapered to d/c).   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  In Dec 2021, she was dx'd /tx'd  for a Seronegative Polyarthritis  by Dr Aryl with MTX  ( later d/c'd).  ? ? ?     Labile HTN predates since 2020 and is followed expectantly. Patient's BP has been controlled at home and patient denies any cardiac symptoms as chest pain, palpitations, shortness of breath, dizziness or ankle swelling. Today's BP  is at goal - 116/72 . ? ? ?    Patient's hyperlipidemia is controlled with diet and Pravastatin /Fenofibrate . Patient denies myalgias or other medication SE's. Last lipids were at goal except elevated Trig's : ? ?Lab Results  ?Component Value Date  ? CHOL 129 02/26/2021  ? HDL 45 (L) 02/26/2021  ? LDLCALC 54 02/26/2021  ? TRIG 242 (H) 02/26/2021  ? CHOLHDL 2.9 02/26/2021  ? ? ? ?    Patient has hx/o remote Gestational DM  ([place]) ,b then in honeymoon til 2016 and was started on Metformin.  After requiring Insulin after steroids for Covid 19 PNA she was transitioned back to Metformin. Patient has lost 35# over the last 3-6 months.  Patient denies reactive hypoglycemic symptoms, visual blurring, diabetic polys or paresthesias. Last A1c was at goal : ? ?Lab Results  ?Component Value Date  ? HGBA1C 5.1  07/04/2021  ? ? ? ? ?                                        Patient was dx'd Hypothyroid at age 21 yo in 39 & has been on thyroid replacement since.  ?  ? ?    Finally, patient has history of Vitamin D Deficiency  ("8" /2018) and last Vitamin D was  ? ?Lab Results  ?Component Value Date  ? VD25OH 79 02/26/2021  ? ? ? ?Current Outpatient Medications on File Prior to Visit  ?Medication Sig  ?    ? albuterol ((2.5 MG/3ML) 0.083% neb soln  Take 3 mLs (2.5 mg total) by neb every 6 hours as needed   ? ALPRAZolam 0.5 MG tablet Take  1/2 to 1 tablet  2 to 3 x /day  if needed for Anxiety  ? FIORICET Take 0.5 tablets  every 4  hours as needed   ? cetirizine (ZYRTEC) 10 MG tablet Take 10 mg  daily.  ? Cholecalciferol 1000 units capsule Take 5,000 Units  2 times daily.   ? VITAMIN  B-12 1000 MCG/ML injection Inject 1,000 mcg into the muscle every 30 (thirty) days.  ? escitalopram (LEXAPRO) 20 MG tablet Take 1 tablet Daily   ? fenofibrate (TRICOR) 145 MG tablet TAKE 1 TABLET DAILY   ? furosemide (LASIX) 40 MG tablet Take 1 tablets  daily as needed.   ? VASCEPA 1 g capsule Take  1 capsule 2 x /day   ? JARDIANCE 25 MG TABS tablet TAKE 1 TABLET DAILY   ? ketorolac (TORADOL) 10 MG tablet Take 1 tablet  every 8 hours as needed   ? Levothyroxine 112 MCG tablet Take 112 mcg every morning.  ? Magnesium 250 MG TABS Take 250 mg by mouth daily.  ? omeprazole 40 MG capsule TAKE 1 CAPSULE EVERY DAY  ? ondansetron ODT 4 MG  Take 1 tablet  every 6 (six) hours as needed   ? OZEMPIC, 0.25 OR 0.5 MG/DOSE, 2 MG/1.5ML  Inject 0.5 mg into the skin once a week.  ? phenazopyridine  200 MG tablet Take 1 tab up to three times a day as needed   ? Phentermine 37.5 MG tablet Take 1 tablet every morning for Dieting & Weight  Loss  ? potassium 10 MEQ tablet Take daily as needed (low potassium).)  ? pravastatin  20 MG tablet TAKE 1 TABLET  AT BEDTIME  ? pregabalin  200 MG capsule Take  1 capsule  3 x /day for Painful Neuropathy (Dx: e11.40)  ? PREMPRO  0.45-1.5 MG tablet TAKE 1 TABLET  EVERY DAY   ? traMADol (ULTRAM) 50 MG tablet Take  1/2 to 1 tablet   every 4 to 6 hours   as needed for Severe Pain  ? traZODone 150 MG tablet  Take 1/2 to 1 tablet 1 hour before Bedtime if needed for Sleep (icd 10  Dx:  g47.00)  ? valACYclovir 1000 MG tablet Take 1 tablet 3 x /day for Fever Blisters / Cold Sores   ? zinc 50 MG tablet Take 5 daily.  ? ? ? ? ?Allergies  ?Allergen Reactions  ? Tetanus Toxoids Other (See Comments)  ?  Injection site abcess  ? Sulfa Antibiotics Hives  ?  itching  ? ? ? ?Past Medical History:  ?Diagnosis Date  ? CHF (congestive heart failure) ([place])   ? COVID-19   ? Diabetes mellitus without complication ([place])   ? Diverticulitis   ? GERD (gastroesophageal reflux disease)   ? History of kidney stones   ? Hyperlipidemia   ? Hypertension   ? Hypothyroidism   ? IBS (irritable bowel syndrome)   ? Insomnia 12/10/2019  ? Kidney stone   ? Migraine   ? MRSA (methicillin resistant staph aureus) culture positive   ? Pneumonia 11/2017  ? PONV (postoperative nausea and vomiting)   ? Thyroid disease   ? UTI (urinary tract infection)   ? ? ? ?Health Maintenance  ?Topic Date Due  ? Hepatitis C Screening  Never done  ? Zoster Vaccines- Shingrix (1 of 2) Never done  ? MAMMOGRAM  Never done  ? PAP SMEAR-Modifier  02/25/2020  ? COVID-19 Vaccine (4 - Booster for Pfizer series) 11/11/2020  ? INFLUENZA VACCINE  03/12/2021  ? OPHTHALMOLOGY EXAM  07/18/2021  ? FOOT EXAM  10/30/2021  ? URINE MICROALBUMIN  10/31/2021  ? HEMOGLOBIN A1C  01/01/2022  ? COLONOSCOPY (Pts 45-42yrs Insurance coverage will need to be confirmed)  12/08/2029  ? HIV Screening  Completed  ? HPV VACCINES  Aged Out  ? TETANUS/TDAP  Discontinued  ? ? ? ?Immunization History  ?Administered Date(s) Administered  ? Influenza Inj Mdck Quad  07/22/2017, 05/14/2018  ? PFIZER Covid-19 Tri-Sucrose Vacc 09/16/2020  ? [place] Vacc  12/30/2019, 01/27/2020  ? Pneumococcal -23 08/20/2018  ? ? ?Last EGD -  12/09/2019 -  Dr Loletha Carrow - Normal. ?  ?Last Colon - 12/09/2019 - Dr Loletha Carrow - Sigmoid Diverticulosis - Recc 10 yr f/u due May 2031.  ?  ?MGM - overdue & patient aware  ? ? ?Past Surgical History:  ?Procedure Laterality Date  ? [place]  ? cystoscopies    ? multiple  ? CYSTOSCOPY WITH RETROGRADE PYELOGRAM, URETEROSCOPY AND STENT PLACEMENT Bilateral 07/04/2021  ? Procedure: CYSTOSCOPY WITH RETROGRADE PYELOGRAM, URETEROSCOPY, BASKETING OF STONES, AND STENT PLACEMENT;  Surgeon: Alexis Frock, MD;  Location: [facility];  Service: Urology;  Laterality: Bilateral;  ? FUNCTIONAL ENDOSCOPIC SINUS SURGERY  2003  ? Normal  ? TONSILLECTOMY  1973  ? TUBAL LIGATION    ? TYMPANOPLASTY W/ MASTOIDECTOMY Left 2016 & 2017  ? WISDOM TOOTH EXTRACTION    ? ? ? ?Family History  ?Problem Relation Age of Onset  ? Suicidality Mother   ? Depression Mother   ? Alcohol abuse Father   ? Kidney disease Father   ? Suicidality Father   ? Depression Father   ? Breast cancer Maternal Aunt   ? Heart disease Maternal Grandmother   ? Breast cancer Maternal Grandmother   ? Suicidality Maternal Grandfather   ? Stroke Paternal Grandmother   ? Heart disease Paternal Grandmother   ? Heart disease Paternal Grandfather   ? Esophageal cancer Maternal Uncle   ? Colon cancer Neg Hx   ? Pancreatic cancer Neg Hx   ? Stomach cancer Neg Hx   ? Liver disease Neg Hx   ? ? ? ?Social History  ? ?Tobacco Use  ? Smoking status: Former  ?  Packs/day: 0.50  ?  Years: 30.00  ?  Pack years: 15.00  ?  Types: Cigarettes  ?  Quit date: 08/24/2017  ?  Years since quitting: 4.2  ? Smokeless tobacco: Never  ?Vaping Use  ? Vaping Use: Never used  ?Substance Use Topics  ? Alcohol use: No  ? Drug use: No  ? ? ? ? ROS ?Constitutional: Denies fever, chills, weight loss/gain, headaches, insomnia,  night sweats, and change in appetite. Does c/o fatigue. ?Eyes: Denies redness, blurred vision, diplopia, discharge, itchy, watery eyes.  ?ENT: Denies discharge,  congestion, post nasal drip, epistaxis, sore throat, earache, hearing loss, dental pain, Tinnitus, Vertigo, Sinus pain, snoring.  ?Cardio: Denies chest pain, palpitations, irregular heartbeat, syncope, dyspnea, di

## 2021-11-07 ENCOUNTER — Other Ambulatory Visit: Payer: Self-pay

## 2021-11-07 ENCOUNTER — Ambulatory Visit (INDEPENDENT_AMBULATORY_CARE_PROVIDER_SITE_OTHER): Payer: Managed Care, Other (non HMO) | Admitting: Internal Medicine

## 2021-11-07 ENCOUNTER — Encounter: Payer: Self-pay | Admitting: Internal Medicine

## 2021-11-07 VITALS — BP 116/72 | HR 90 | Temp 97.9°F | Resp 16 | Ht 63.0 in | Wt 139.8 lb

## 2021-11-07 DIAGNOSIS — E785 Hyperlipidemia, unspecified: Secondary | ICD-10-CM

## 2021-11-07 DIAGNOSIS — Z111 Encounter for screening for respiratory tuberculosis: Secondary | ICD-10-CM | POA: Diagnosis not present

## 2021-11-07 DIAGNOSIS — N6019 Diffuse cystic mastopathy of unspecified breast: Secondary | ICD-10-CM

## 2021-11-07 DIAGNOSIS — Z13220 Encounter for screening for lipoid disorders: Secondary | ICD-10-CM | POA: Diagnosis not present

## 2021-11-07 DIAGNOSIS — E559 Vitamin D deficiency, unspecified: Secondary | ICD-10-CM | POA: Diagnosis not present

## 2021-11-07 DIAGNOSIS — Z130 Encounter for screening for diseases of the blood and blood-forming organs and certain disorders involving the immune mechanism: Secondary | ICD-10-CM

## 2021-11-07 DIAGNOSIS — Z0000 Encounter for general adult medical examination without abnormal findings: Secondary | ICD-10-CM | POA: Diagnosis not present

## 2021-11-07 DIAGNOSIS — E1169 Type 2 diabetes mellitus with other specified complication: Secondary | ICD-10-CM

## 2021-11-07 DIAGNOSIS — Z131 Encounter for screening for diabetes mellitus: Secondary | ICD-10-CM | POA: Diagnosis not present

## 2021-11-07 DIAGNOSIS — Z1389 Encounter for screening for other disorder: Secondary | ICD-10-CM

## 2021-11-07 DIAGNOSIS — Z1231 Encounter for screening mammogram for malignant neoplasm of breast: Secondary | ICD-10-CM

## 2021-11-07 DIAGNOSIS — Z0001 Encounter for general adult medical examination with abnormal findings: Secondary | ICD-10-CM

## 2021-11-07 DIAGNOSIS — E6609 Other obesity due to excess calories: Secondary | ICD-10-CM

## 2021-11-07 DIAGNOSIS — R0989 Other specified symptoms and signs involving the circulatory and respiratory systems: Secondary | ICD-10-CM | POA: Diagnosis not present

## 2021-11-07 DIAGNOSIS — Z1211 Encounter for screening for malignant neoplasm of colon: Secondary | ICD-10-CM

## 2021-11-07 DIAGNOSIS — E1140 Type 2 diabetes mellitus with diabetic neuropathy, unspecified: Secondary | ICD-10-CM

## 2021-11-07 DIAGNOSIS — Z136 Encounter for screening for cardiovascular disorders: Secondary | ICD-10-CM | POA: Diagnosis not present

## 2021-11-07 DIAGNOSIS — Z13820 Encounter for screening for osteoporosis: Secondary | ICD-10-CM

## 2021-11-07 DIAGNOSIS — Z87891 Personal history of nicotine dependence: Secondary | ICD-10-CM

## 2021-11-07 DIAGNOSIS — Z8249 Family history of ischemic heart disease and other diseases of the circulatory system: Secondary | ICD-10-CM

## 2021-11-07 DIAGNOSIS — I700 Atherosclerosis of aorta: Secondary | ICD-10-CM

## 2021-11-07 DIAGNOSIS — R5383 Other fatigue: Secondary | ICD-10-CM

## 2021-11-07 DIAGNOSIS — E039 Hypothyroidism, unspecified: Secondary | ICD-10-CM

## 2021-11-07 DIAGNOSIS — Z79899 Other long term (current) drug therapy: Secondary | ICD-10-CM | POA: Diagnosis not present

## 2021-11-07 DIAGNOSIS — E2839 Other primary ovarian failure: Secondary | ICD-10-CM

## 2021-11-07 DIAGNOSIS — Z1212 Encounter for screening for malignant neoplasm of rectum: Secondary | ICD-10-CM

## 2021-11-07 MED ORDER — PHENTERMINE HCL 37.5 MG PO TABS: ORAL_TABLET | ORAL | 1 refills | Status: DC

## 2021-11-07 MED ORDER — OZEMPIC (2 MG/DOSE) 8 MG/3ML ~~LOC~~ SOPN: 2.0000 mg | PEN_INJECTOR | SUBCUTANEOUS | 1 refills | Status: DC

## 2021-11-08 LAB — LIPID PANEL
Cholesterol: 184 mg/dL (ref <200)
HDL: 68 mg/dL (ref >=50)
LDL Cholesterol (Calc): 89 mg/dL (calc)
Non-HDL Cholesterol (Calc): 116 mg/dL (calc) (ref <130)
Total CHOL/HDL Ratio: 2.7 (calc) (ref <5.0)
Triglycerides: 173 mg/dL — ABNORMAL HIGH (ref <150)

## 2021-11-08 LAB — CBC WITH DIFFERENTIAL/PLATELET
Absolute Monocytes: 562 cells/uL (ref 200–950)
Basophils Absolute: 52 cells/uL (ref 0–200)
Basophils Relative: 0.7 %
Eosinophils Absolute: 111 cells/uL (ref 15–500)
Eosinophils Relative: 1.5 %
HCT: 45.2 % — ABNORMAL HIGH (ref 35.0–45.0)
Hemoglobin: 15 g/dL (ref 11.7–15.5)
Lymphs Abs: 2353 cells/uL (ref 850–3900)
MCH: 30.7 pg (ref 27.0–33.0)
MCHC: 33.2 g/dL (ref 32.0–36.0)
MCV: 92.6 fL (ref 80.0–100.0)
MPV: 10.5 fL (ref 7.5–12.5)
Monocytes Relative: 7.6 %
Neutro Abs: 4322 cells/uL (ref 1500–7800)
Neutrophils Relative %: 58.4 %
Platelets: 348 10*3/uL (ref 140–400)
RBC: 4.88 10*6/uL (ref 3.80–5.10)
RDW: 13 % (ref 11.0–15.0)
Total Lymphocyte: 31.8 %
WBC: 7.4 10*3/uL (ref 3.8–10.8)

## 2021-11-08 LAB — COMPLETE METABOLIC PANEL WITH GFR
AG Ratio: 1.9 (calc) (ref 1.0–2.5)
ALT: 17 U/L (ref 6–29)
AST: 19 U/L (ref 10–35)
Albumin: 4.5 g/dL (ref 3.6–5.1)
Alkaline phosphatase (APISO): 56 U/L (ref 37–153)
BUN: 17 mg/dL (ref 7–25)
CO2: 29 mmol/L (ref 20–32)
Calcium: 10.1 mg/dL (ref 8.6–10.4)
Chloride: 99 mmol/L (ref 98–110)
Creat: 0.68 mg/dL (ref 0.50–1.03)
Globulin: 2.4 g/dL (calc) (ref 1.9–3.7)
Glucose, Bld: 80 mg/dL (ref 65–99)
Potassium: 4.6 mmol/L (ref 3.5–5.3)
Sodium: 138 mmol/L (ref 135–146)
Total Bilirubin: 0.6 mg/dL (ref 0.2–1.2)
Total Protein: 6.9 g/dL (ref 6.1–8.1)
eGFR: 103 mL/min/{1.73_m2} (ref >=60)

## 2021-11-08 LAB — URINALYSIS, ROUTINE W REFLEX MICROSCOPIC
Bilirubin Urine: NEGATIVE
Hgb urine dipstick: NEGATIVE
Ketones, ur: NEGATIVE
Leukocytes,Ua: NEGATIVE
Nitrite: NEGATIVE
Protein, ur: NEGATIVE
Specific Gravity, Urine: 1.025 (ref 1.001–1.035)
pH: 5.5 (ref 5.0–8.0)

## 2021-11-08 LAB — MICROALBUMIN / CREATININE URINE RATIO
Creatinine, Urine: 75 mg/dL (ref 20–275)
Microalb Creat Ratio: 5 mcg/mg creat (ref <30)
Microalb, Ur: 0.4 mg/dL

## 2021-11-08 LAB — TSH: TSH: 2.24 mIU/L

## 2021-11-08 LAB — INSULIN, RANDOM: Insulin: 9.1 u[IU]/mL

## 2021-11-08 LAB — HEMOGLOBIN A1C
Hgb A1c MFr Bld: 5.4 % of total Hgb (ref <5.7)
Mean Plasma Glucose: 108 mg/dL
eAG (mmol/L): 6 mmol/L

## 2021-11-08 LAB — IRON, TOTAL/TOTAL IRON BINDING CAP
%SAT: 21 % (calc) (ref 16–45)
Iron: 88 ug/dL (ref 45–160)
TIBC: 416 mcg/dL (calc) (ref 250–450)

## 2021-11-08 LAB — VITAMIN B12: Vitamin B-12: >2000 pg/mL — ABNORMAL HIGH (ref 200–1100)

## 2021-11-08 LAB — VITAMIN D 25 HYDROXY (VIT D DEFICIENCY, FRACTURES): Vit D, 25-Hydroxy: 80 ng/mL (ref 30–100)

## 2021-11-08 LAB — MAGNESIUM: Magnesium: 1.8 mg/dL (ref 1.5–2.5)

## 2021-11-08 NOTE — Progress Notes (Signed)
<><><><><><><><><><><><><><><><><><><><><><><><><><><><><><><><><> ?<><><><><><><><><><><><><><><><><><><><><><><><><><><><><><><><><> ?-   Test results slightly outside the reference range are not unusual. ?If there is anything important, I will review this with you,  ?otherwise it is considered normal test values.  ?If you have further questions,  ?please do not hesitate to contact me at the office or via My Chart.  ?<><><><><><><><><><><><><><><><><><><><><><><><><><><><><><><><><> ?<><><><><><><><><><><><><><><><><><><><><><><><><><><><><><><><><> ? ?-   ?-  Total  Chol =  184   -  Great ! ?           (  Ideal  or  Goal is less than 180  !  )  ? ?- and  ? ?-  Bad / Dangerous LDL  Chol =  89     -  Slightly elevated   ?            (  Ideal  or  Goal is less than 70  !  )  ? ?- Recommend a stricter Low Cholesterol diet  ? ?- Cholesterol only comes from animal sources  ?- ie. meat, dairy, egg yolks ? ?- Eat all the vegetables you want. ? ?- Avoid meat, especially red meat - Beef AND Pork . ? ?- Avoid cheese & dairy - milk & ice cream.    ? ?- Cheese is the most concentrated form of trans-fats which  ?is the worst thing to clog up our arteries.  ? ?- Veggie cheese is OK which can be found in the fresh  ?produce section at [facility] or [employer] or [employer] ?<><><><><><><><><><><><><><><><><><><><><><><><><><><><><><><><><> ?<><><><><><><><><><><><><><><><><><><><><><><><><><><><><><><><><> ? ?- Vitamin B12 is very very high  ( over 2000 ), So May stop shots  &  ?                    (Ideal or Goal Vit B12 is between 450 - 1,100)  ? ? ?- Recommend take a sub-lingual form of Vitamin B12 tablet  ? ?1,000 to 5,000 mcg tab that you dissolve under your tongue /Daily  ? ?- Can get Lavonia Dana - best price at [employer] or on [employer] ?<><><><><><><><><><><><><><><><><><><><><><><><><><><><><><><><><> ?<><><><><><><><><><><><><><><><><><><><><><><><><><><><><><><><><> ? ?- IRON level is Normal / OK   ?<><><><><><><><><><><><><><><><><><><><><><><><><><><><><><><><><> ?<><><><><><><><><><><><><><><><><><><><><><><><><><><><><><><><><> ? ?- Glucose level at blood draw was 80 mg% - Great ! ? ?- A1c = 5.5% - NORMAL ! ! !  ? ?- OK to stop the Jardiance & continue the Ozempic  ?<><><><><><><><><><><><><><><><><><><><><><><><><><><><><><><><><> ?<><><><><><><><><><><><><><><><><><><><><><><><><><><><><><><><><> ? ?- TSH - is normal - Please continue Thyroid meds same  ?<><><><><><><><><><><><><><><><><><><><><><><><><><><><><><><><><> ?<><><><><><><><><><><><><><><><><><><><><><><><><><><><><><><><><> ? ?- Vitamin D = 80 - Excellent  !   - Please keep dose same  ?<><><><><><><><><><><><><><><><><><><><><><><><><><><><><><><><><> ?<><><><><><><><><><><><><><><><><><><><><><><><><><><><><><><><><> ? ?- All Else - CBC - Kidneys - Electrolytes - Liver - Magnesium & Thyroid   ? ?- all  Normal / OK ?<><><><><><><><><><><><><><><><><><><><><><><><><><><><><><><><><> ?<><><><><><><><><><><><><><><><><><><><><><><><><><><><><><><><><> ? ?- Keep up the [place] Work   ! ?<><><><><><><><><><><><><><><><><><><><><><><><><><><><><><><><><> ?<><><><><><><><><><><><><><><><><><><><><><><><><><><><><><><><><> ? ? ? ? ? ? ? ? ? ? ? ? ? ? ? ? ? ? ? ? ? ? ? ? ? ? ? ? ? ? ? ? ? ? ? ? ? ? ? ?

## 2021-11-14 IMAGING — CT CT RENAL STONE PROTOCOL
2 of 4 series · 17 of 46 positions shown, 19 images · non-contrast
Comparison: None.

CLINICAL DATA: Flank pain

EXAM:
CT ABDOMEN AND PELVIS WITHOUT CONTRAST
TECHNIQUE: Multidetector CT imaging of the abdomen and pelvis was performed
following the standard protocol without IV contrast.

[Series 3: ap without · axial · non-contrast · 0.76mm/px · z∈[+840,+1220]mm · 14 of 86 slices shown, 16 images]
[im 5/86  soft-tissue]
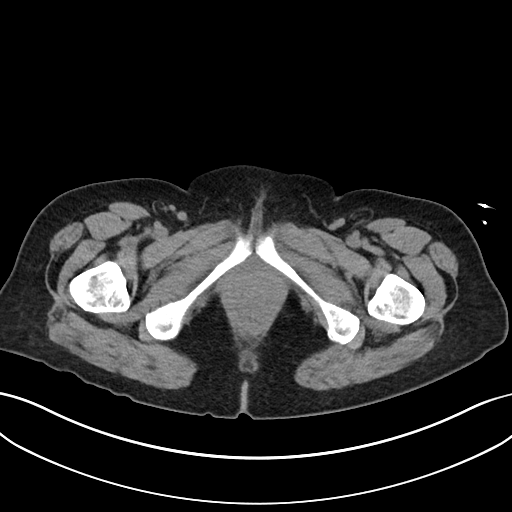
[im 5/86  bone]
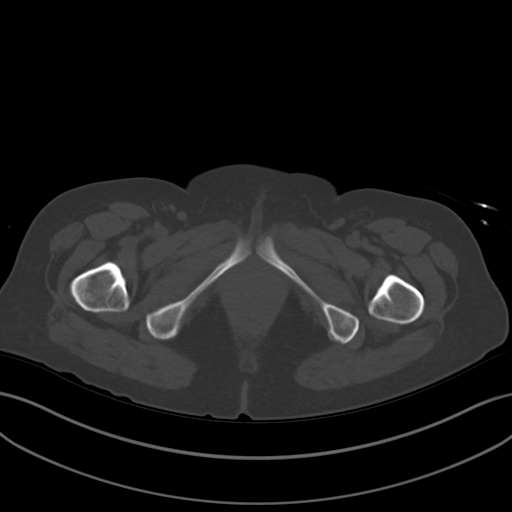
[im 13/86  soft-tissue]
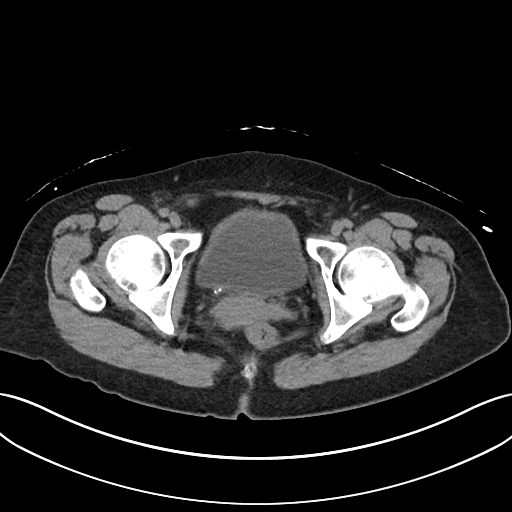
[im 17/86  soft-tissue]
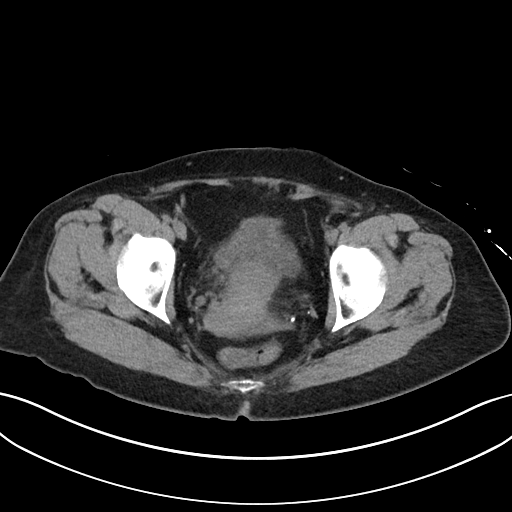
[im 25/86  soft-tissue]
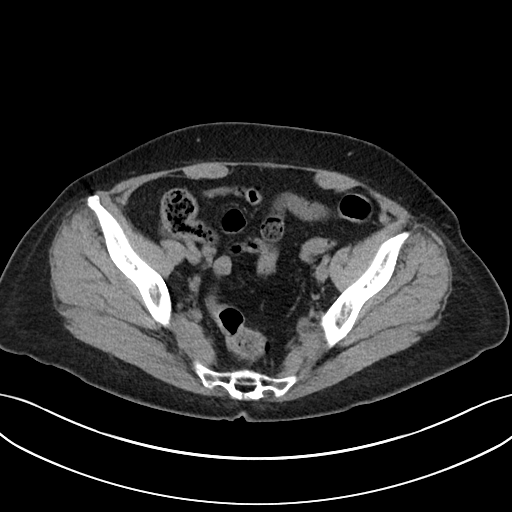
[im 29/86  soft-tissue]
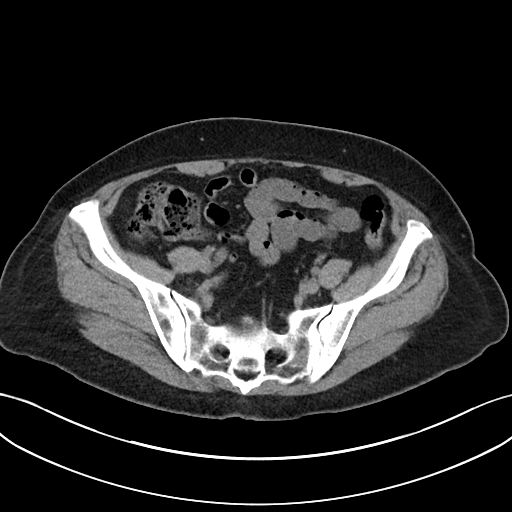
[im 33/86  soft-tissue]
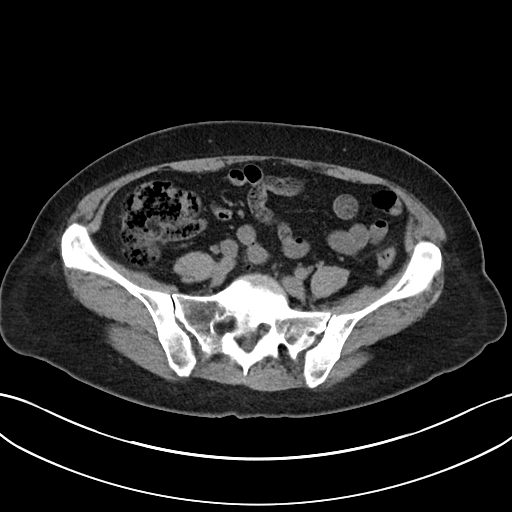
[im 41/86  soft-tissue]
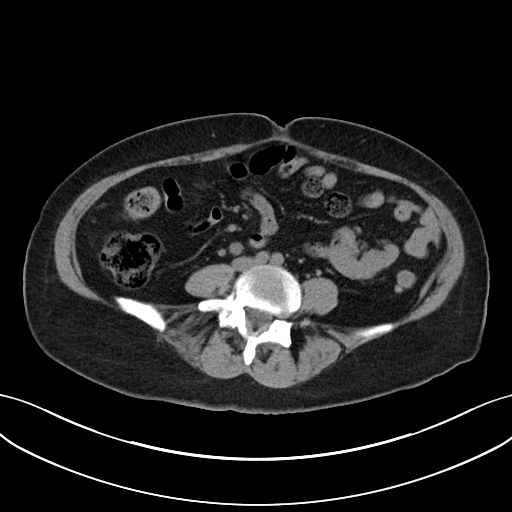
[im 45/86  soft-tissue]
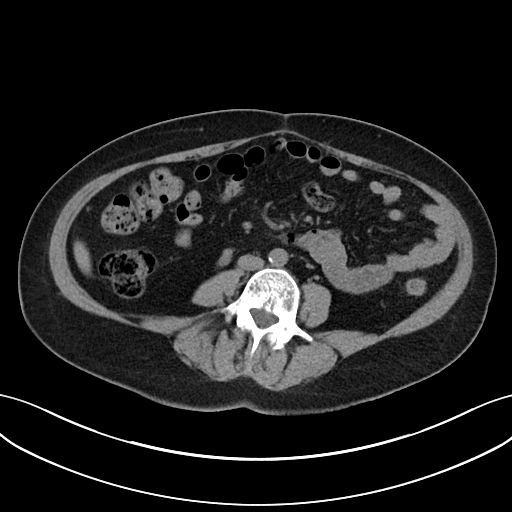
[im 53/86  soft-tissue]
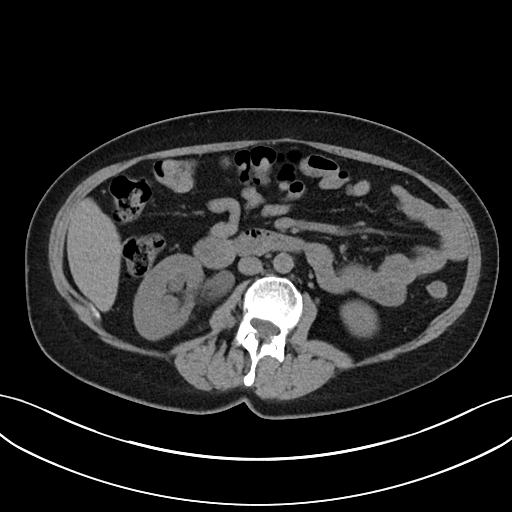
[im 53/86  bone]
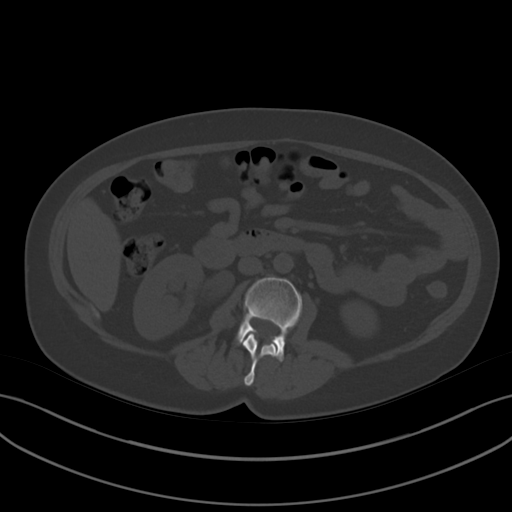
[im 57/86  soft-tissue]
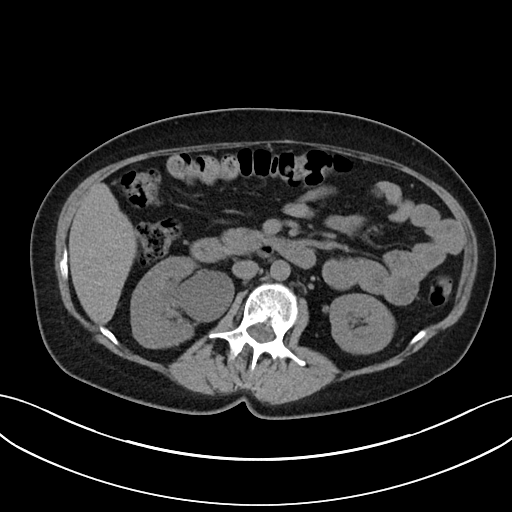
[im 65/86  soft-tissue]
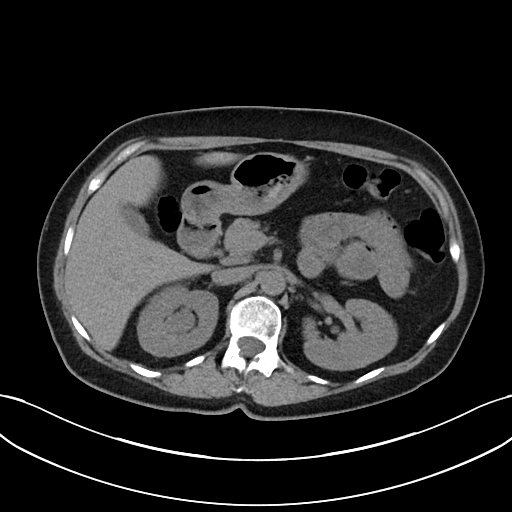
[im 69/86  soft-tissue]
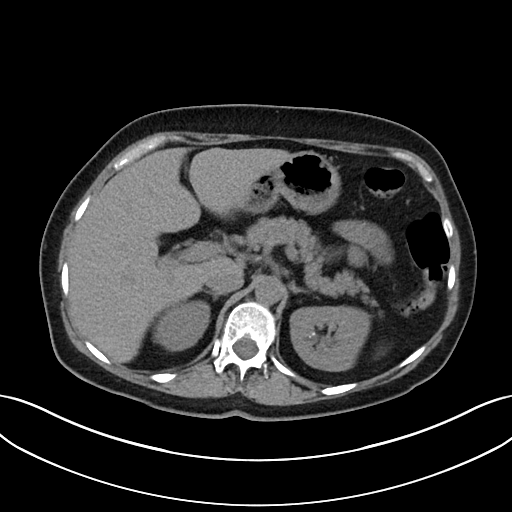
[im 73/86  soft-tissue]
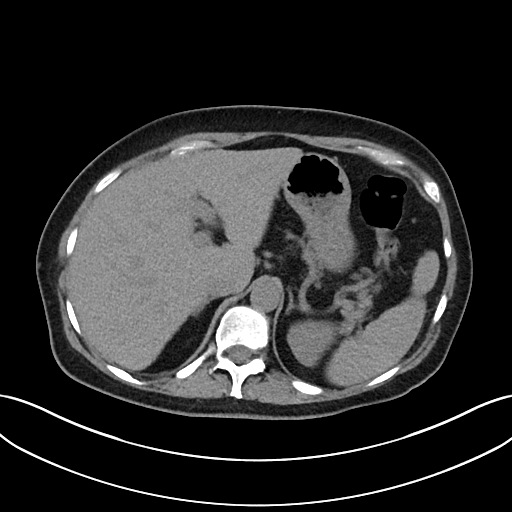
[im 81/86  soft-tissue]
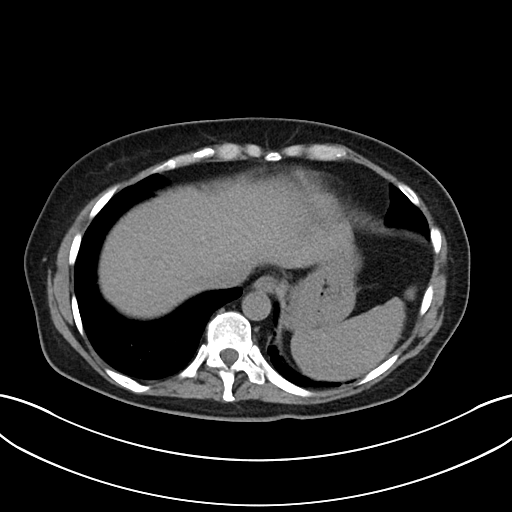

[Series 6: cor · coronal · 0.79mm/px · 3 of 81 slices shown]
[im 27/81  soft-tissue]
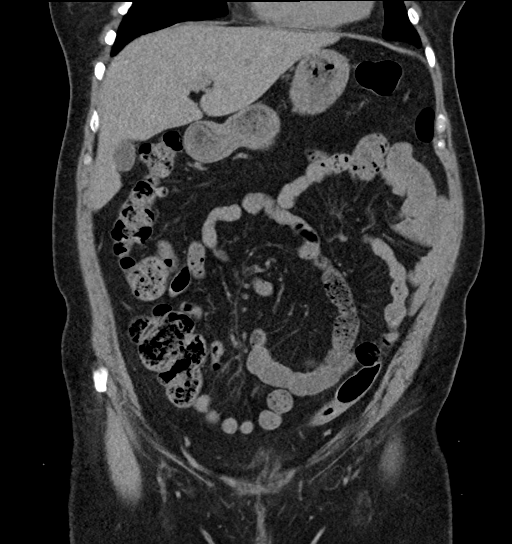
[im 36/81  soft-tissue]
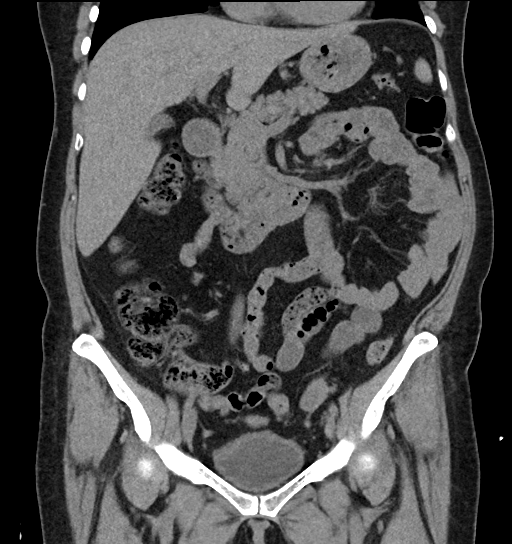
[im 45/81  soft-tissue]
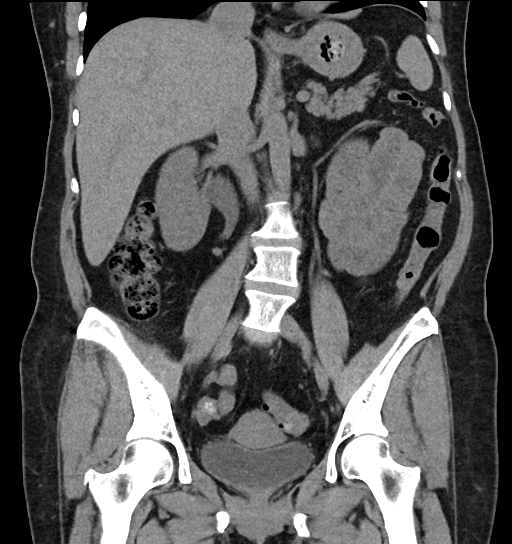

[17 of 46 positions shown; findings below may reference images not displayed]

FINDINGS: Lower chest: Mild left basilar atelectasis.  No acute abnormality.

Hepatobiliary: No focal liver abnormality is seen. No gallstones,
gallbladder wall thickening, or biliary dilatation.

Pancreas: Unremarkable. No pancreatic ductal dilatation or
surrounding inflammatory changes.

Spleen: Normal in size without focal abnormality.

Adrenals/Urinary Tract: Bilateral adrenal glands are unremarkable.
Moderate right hydronephrosis and hydroureter. Two small stones are
seen at the right ureterovesicular junction measuring 2 and 3 mm.
Punctate nonobstructing stones of the upper pole of the left kidney.

Stomach/Bowel: Stomach is within normal limits. Appendix appears
normal. Diverticulosis. No evidence of bowel wall thickening,
distention, or inflammatory changes.

Vascular/Lymphatic: Aortic atherosclerosis. No enlarged abdominal or
pelvic lymph nodes.

Reproductive: Uterus and bilateral adnexa are unremarkable.

Other: No abdominal wall hernia or abnormality. No abdominopelvic
ascites.

Musculoskeletal: Levocurvature of the lumbar spine. No acute osseous
abnormality.
IMPRESSION: 1. Two small stones are seen at the right ureterovesicular junction
measuring 2 and 3 mm. Moderate right hydronephrosis and hydroureter
is suggestive of obstruction.
2. Punctate nonobstructing stones of the upper pole of the left
kidney.
3.  Aortic Atherosclerosis ([id number].L).

## 2021-11-16 ENCOUNTER — Ambulatory Visit
Admission: RE | Admit: 2021-11-16 | Discharge: 2021-11-16 | Disposition: A | Discharge status: Home / Self Care | Payer: Managed Care, Other (non HMO) | Source: Ambulatory Visit | Attending: Internal Medicine | Admitting: Internal Medicine

## 2021-11-16 DIAGNOSIS — N6019 Diffuse cystic mastopathy of unspecified breast: Secondary | ICD-10-CM

## 2021-11-16 DIAGNOSIS — Z1231 Encounter for screening mammogram for malignant neoplasm of breast: Secondary | ICD-10-CM

## 2021-11-20 ENCOUNTER — Other Ambulatory Visit: Payer: Self-pay | Admitting: Internal Medicine

## 2021-11-20 DIAGNOSIS — R928 Other abnormal and inconclusive findings on diagnostic imaging of breast: Secondary | ICD-10-CM

## 2021-12-04 ENCOUNTER — Other Ambulatory Visit: Payer: Self-pay | Admitting: Adult Health

## 2021-12-04 DIAGNOSIS — Z780 Asymptomatic menopausal state: Secondary | ICD-10-CM

## 2021-12-04 DIAGNOSIS — N951 Menopausal and female climacteric states: Secondary | ICD-10-CM

## 2021-12-07 ENCOUNTER — Encounter: Payer: Self-pay | Admitting: Internal Medicine

## 2021-12-07 ENCOUNTER — Other Ambulatory Visit: Payer: Self-pay | Admitting: Internal Medicine

## 2021-12-07 MED ORDER — OZEMPIC (2 MG/DOSE) 8 MG/3ML ~~LOC~~ SOPN
2.0000 mg | PEN_INJECTOR | SUBCUTANEOUS | 1 refills | Status: DC
Start: 2021-12-07 — End: 2022-04-19

## 2021-12-11 ENCOUNTER — Other Ambulatory Visit: Payer: Self-pay | Admitting: Internal Medicine

## 2021-12-11 ENCOUNTER — Other Ambulatory Visit: Payer: Self-pay | Admitting: Gastroenterology

## 2021-12-11 ENCOUNTER — Other Ambulatory Visit: Payer: Self-pay | Admitting: Adult Health

## 2021-12-11 DIAGNOSIS — J208 Acute bronchitis due to other specified organisms: Secondary | ICD-10-CM

## 2021-12-11 DIAGNOSIS — K219 Gastro-esophageal reflux disease without esophagitis: Secondary | ICD-10-CM

## 2021-12-11 DIAGNOSIS — G4483 Primary cough headache: Secondary | ICD-10-CM

## 2021-12-11 NOTE — Telephone Encounter (Signed)
Last seen 2 years ago - appointment needed if refills requested. ? ?Please contact patient. ? ?- HD ?

## 2021-12-18 ENCOUNTER — Ambulatory Visit
Admission: RE | Admit: 2021-12-18 | Discharge: 2021-12-18 | Disposition: A | Discharge status: Home / Self Care | Payer: Managed Care, Other (non HMO) | Source: Ambulatory Visit | Attending: Internal Medicine | Admitting: Internal Medicine

## 2021-12-18 ENCOUNTER — Ambulatory Visit: Payer: Managed Care, Other (non HMO)

## 2021-12-18 DIAGNOSIS — R928 Other abnormal and inconclusive findings on diagnostic imaging of breast: Secondary | ICD-10-CM

## 2021-12-21 ENCOUNTER — Other Ambulatory Visit: Payer: Self-pay | Admitting: Internal Medicine

## 2021-12-21 DIAGNOSIS — N951 Menopausal and female climacteric states: Secondary | ICD-10-CM

## 2021-12-21 DIAGNOSIS — Z780 Asymptomatic menopausal state: Secondary | ICD-10-CM

## 2021-12-21 MED ORDER — PREMPRO 0.45-1.5 MG PO TABS: ORAL_TABLET | ORAL | 3 refills | Status: DC

## 2021-12-24 ENCOUNTER — Other Ambulatory Visit: Payer: Self-pay | Admitting: Gastroenterology

## 2021-12-24 DIAGNOSIS — K219 Gastro-esophageal reflux disease without esophagitis: Secondary | ICD-10-CM

## 2021-12-27 LAB — HM DIABETES EYE EXAM

## 2022-01-01 ENCOUNTER — Other Ambulatory Visit: Payer: Self-pay | Admitting: Internal Medicine

## 2022-01-01 ENCOUNTER — Encounter: Payer: Self-pay | Admitting: Internal Medicine

## 2022-01-01 MED ORDER — ONDANSETRON HCL 8 MG PO TABS: ORAL_TABLET | ORAL | 0 refills | Status: DC

## 2022-01-03 ENCOUNTER — Encounter: Payer: Self-pay | Admitting: Internal Medicine

## 2022-02-07 ENCOUNTER — Ambulatory Visit: Payer: Managed Care, Other (non HMO) | Admitting: Nurse Practitioner

## 2022-03-21 ENCOUNTER — Other Ambulatory Visit: Payer: Self-pay | Admitting: Nurse Practitioner

## 2022-04-09 ENCOUNTER — Other Ambulatory Visit: Payer: Self-pay | Admitting: Internal Medicine

## 2022-04-09 DIAGNOSIS — E1140 Type 2 diabetes mellitus with diabetic neuropathy, unspecified: Secondary | ICD-10-CM

## 2022-04-19 ENCOUNTER — Encounter: Payer: Self-pay | Admitting: Internal Medicine

## 2022-04-19 ENCOUNTER — Other Ambulatory Visit: Payer: Self-pay | Admitting: Internal Medicine

## 2022-04-19 DIAGNOSIS — E6609 Other obesity due to excess calories: Secondary | ICD-10-CM

## 2022-04-19 DIAGNOSIS — E119 Type 2 diabetes mellitus without complications: Secondary | ICD-10-CM

## 2022-04-19 DIAGNOSIS — E1165 Type 2 diabetes mellitus with hyperglycemia: Secondary | ICD-10-CM

## 2022-04-19 MED ORDER — PHENTERMINE HCL 37.5 MG PO TABS: ORAL_TABLET | ORAL | 1 refills | Status: DC

## 2022-04-19 MED ORDER — OZEMPIC (2 MG/DOSE) 8 MG/3ML ~~LOC~~ SOPN: 2.0000 mg | PEN_INJECTOR | SUBCUTANEOUS | 3 refills | Status: DC

## 2022-04-29 ENCOUNTER — Other Ambulatory Visit: Payer: Managed Care, Other (non HMO)

## 2022-05-12 ENCOUNTER — Encounter: Payer: Self-pay | Admitting: Internal Medicine

## 2022-05-12 NOTE — Progress Notes (Unsigned)
Future Appointments  Date Time Provider Department  05/14/2022              6 mo ov 10:30 AM [name], MD [facility]  11/14/2022                cpe 10:00 AM [name], MD [facility]    History of Present Illness:       This very nice 56 y.o. MWF  presents for  6 month follow up with HTN, HLD, Hypothyroidism, T2_NIDDM and Vitamin D Deficiency.        Patient is followed expectantly  for labile HTN  circa 2020  & BP has been controlled at home. Today's BP is                               .   Patient has had no complaints of any cardiac type chest pain, palpitations, dyspnea Pollyann Kennedy /PND, dizziness, claudication or dependent edema.       Hyperlipidemia is controlled with diet & Fenofibrate Candee Furbish [place] Nail. Patient denies myalgias or other med SE's. Last Lipids were at goal except elevated Trig's:  Lab Results  Component Value Date   CHOL 184 11/07/2021   HDL 68 11/07/2021   LDLCALC 89 11/07/2021   TRIG 173 (H) 11/07/2021   CHOLHDL 2.7 11/07/2021     Patient has hx/o remote Gestational DM  (1993 & 1996) , then in honeymoon til 2016 when started on Metformin.  After hospitalization in Dec 2020 w/Covid PNA  when  treated with steroids exacerbating hyperglycemia necessitating insulin for glucose control and unfortunate weight gain. Since then, she  did use low dose Ozempic for a short period of time  and has since d/c'd  Metformin & is only on Jardiance and has had no symptoms of reactive hypoglycemia, diabetic polys, paresthesias or visual blurring.  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  In Dec 2021,  she was dx'd /tx'd  for a Seronegative Polyarthritis  by Dr Aryl with MTX  ( later d/c'd ) .                                                          Further, the patient also has history of Vitamin D Deficiency  ("8" /2018) and supplements vitamin D without any suspected side-effects. Last vitamin D was at goal:  Lab Results  Component Value Date   VD25OH 80 11/07/2021  Patient was dx'd Hypothyroid at age 47 yo in 48 & has been on thyroid replacement since.     Current Outpatient Medications  Medication Instructions   albuterol (PROVENTIL HFA;VENTOLIN HFA) 108 (90 Base) MCG/ACT inhaler 2 inhalations  15-20  minutes apart every 4 hours as needed to rescue Asthma   albuterol (PROVENTIL) 2.5 mg, Nebulization, Every 6 hours PRN   ALPRAZolam (XANAX) 0.5 MG tablet Take 1/2 - 1 tablet 2 - 3 x /day ONLY if needed for Anxiety Attack    FIORICET  MG tablet 0.5 tablets Every 4 hours PRN   ZYRTEC  10 mg Daily   Vit D 5,000 Units,  2 times daily   VITAMIN B12    1,000 mcg  Im, Every 30 days   escitalopram ) 20 MG tablet Take 1 tablet Daily for Mood   PREMPRO 0.45-1.5 MG tablet Take 1 tablet  Daily   fenofibrate 145 MG tablet TAKE 1 TABLET DAILY    furosemide 20-40 mg PRN   icosapent Ethyl (VASCEPA) 1 g capsule Take  1 capsule 2 x /day  2 TIMES DAILY.   JARDIANCE 25 MG TABS tablet TAKE 1 TABLET DAILY    Levothyroxine  112 mcg Every morning   Magnesium     250 mg Daily   omeprazole  40 MG capsule TAKE 1 CAPSULE  EVERY DAY   ondansetron  8 MG tablet Take  1/2 to 1 tab  every 6 to 8 hours  as needed    Ozempic (2 MG/DOSE) 2 mg, Subcutaneous, Weekly   phenazopyridine 200 MG tablet Take 1 tab up to three times a day as needed   phentermine  37.5 MG tablet Take 1 tablet every morning    potassium chloride  10 MEQ tablet TAKE 1 TABLET  EVERY DAY   pravastatin (PRAVACHOL) 20 MG tablet TAKE 1 TABLET AT BEDTIME   pregabalin  200 MG capsule TAKE  1 CAPSULE 3 TIMES A DAY FOR PAINFUL NEUROPATHY   traMADol   50 MG tablet Take  1/2 to 1 tablet   every 4 to 6 hours   as needed for Severe Pain   traZODone (150 MG tablet Take 1/2 to 1 tablet 1 hour before Bedtime if needed for Sleep (icd 10  Dx:  g47.00)   valACYclovir  1000 MG tablet Take 1 tablet 3 x /day for Fever Blisters   zinc  50 mg Daily     Allergies  Allergen Reactions   Tetanus Toxoids Other (See Comments)    Injection site abcess   Sulfa Antibiotics Hives    itching     PMHx:   Past Medical History:  Diagnosis Date   COVID-19    Diabetes mellitus without complication (HCC)    Diverticulitis    GERD (gastroesophageal reflux disease)    Hyperlipidemia    IBS (irritable bowel syndrome)    Insomnia 12/10/2019   Migraine    Pneumonia 11/2017   Thyroid disease    UTI (urinary tract infection)      Immunization History  Administered Date(s) Administered   Influenza Inj Mdck Quad  07/22/2017, 05/14/2018   PFIZER  Covid-19 Tri-Sucrose Vacc 09/16/2020   PFIZER SARS-COV-2 Vacc 12/30/2019, 01/27/2020   Pneumococcal -23 08/20/2018     Past Surgical History:  Procedure Laterality Date   CESAREAN SECTION  1993   cystoscopies     multiple   FUNCTIONAL ENDOSCOPIC SINUS SURGERY  2003   LAPAROSCOPIC OVARIAN CYSTECTOMY  1983   TONSILLECTOMY  1973   TUBAL LIGATION     TYMPANOPLASTY W/ MASTOIDECTOMY Left 2016 & 2017    FHx:    Reviewed / unchanged  SHx:    Reviewed / unchanged   Systems Review:  Constitutional: Denies fever, chills, wt changes, headaches, insomnia, fatigue, night sweats, change in appetite. Eyes: Denies redness, blurred vision, diplopia, discharge, itchy, watery eyes.  ENT: Denies discharge, congestion, post nasal drip, epistaxis, sore throat, earache, hearing loss, dental pain, tinnitus, vertigo, sinus pain, snoring.  CV: Denies chest pain, palpitations, irregular heartbeat, syncope, dyspnea, diaphoresis, orthopnea, PND, claudication or  edema. Respiratory: denies cough, dyspnea, DOE, pleurisy, hoarseness, laryngitis, wheezing.  Gastrointestinal: Denies dysphagia, odynophagia, heartburn, reflux, water brash, abdominal pain or cramps, nausea, vomiting, bloating, diarrhea, constipation, hematemesis, melena, hematochezia  or hemorrhoids. Genitourinary: Denies dysuria, frequency, urgency, nocturia, hesitancy, discharge, hematuria or flank pain. Musculoskeletal: Denies arthralgias, myalgias, stiffness, jt. swelling, pain, limping or strain/sprain.  Skin: Denies pruritus, rash, hives, warts, acne, eczema or change in skin lesion(s). Neuro: No weakness, tremor, incoordination, spasms, paresthesia or pain. Psychiatric: Denies confusion, memory loss or sensory loss. Endo: Denies change in weight, skin or hair change.  Heme/Lymph: No excessive bleeding, bruising or enlarged lymph nodes.  Physical Exam  There were no vitals taken for this visit.  Appears  well nourished, well groomed  and in no distress.  Eyes: PERRLA, EOMs, conjunctiva no swelling or erythema. Sinuses: No frontal/maxillary tenderness ENT/Mouth: EAC's clear, TM's nl w/o erythema, bulging. Nares clear w/o erythema, swelling, exudates. Oropharynx clear without erythema or exudates. Oral hygiene is good. Tongue normal, non obstructing. Hearing intact.  Neck: Supple. Thyroid not palpable. Car 2+/2+ without bruits, nodes or JVD. Chest: Respirations nl with BS clear & equal w/o rales, rhonchi, wheezing or stridor.  Cor: Heart sounds normal w/ regular rate and rhythm without sig. murmurs, gallops, clicks or rubs. Peripheral pulses normal and equal  without edema.  Abdomen: Soft & bowel sounds normal. Non-tender w/o guarding, rebound, hernias, masses or organomegaly.  Lymphatics: Unremarkable.  Musculoskeletal: Full ROM all peripheral extremities, joint stability, 5/5 strength and normal gait.  Skin: Warm, dry without exposed rashes, lesions or ecchymosis apparent.  Neuro:  Cranial nerves intact, reflexes equal bilaterally. Sensory-motor testing grossly intact. Tendon reflexes grossly intact.  Pysch: Alert & oriented x 3.  Insight and judgement nl & appropriate. No ideations.  Assessment and Plan:   1. Labile hypertension  - CBC with Differential/Platelet - COMPLETE METABOLIC PANEL WITH GFR - Magnesium - TSH  2. Hyperlipidemia associated with type 2 diabetes mellitus (HCC)  - Lipid panel - TSH  3. Type 2 diabetes mellitus with diabetic neuropathy,HCC)  - Hemoglobin A1c - Insulin, random  4. Vitamin D deficiency  - VITAMIN D 25 Hydroxy   5. Hypothyroidism, unspecified type  - TSH  6. Medication management  - CBC with Differential/Platelet - COMPLETE METABOLIC PANEL WITH GFR - Magnesium - Lipid panel - TSH - Hemoglobin A1c - Insulin, random - VITAMIN D 25 Hydroxy          Discussed  regular exercise, BP monitoring, weight control to achieve/maintain BMI less than 25 and discussed med and SE's. Recommended labs to assess and monitor clinical status with further disposition pending results of labs.  I discussed the assessment and treatment plan with the patient. The patient was provided an opportunity to ask questions and all were answered. The patient agreed with the plan and demonstrated an understanding of the instructions.  I provided over 30 minutes of exam, counseling,  chart review and  complex critical decision making.        The patient was advised to call back or seek an in-person evaluation if the symptoms worsen or if the condition fails to improve as anticipated.   Kirtland Bouchard, MD

## 2022-05-12 NOTE — Patient Instructions (Signed)

## 2022-05-14 ENCOUNTER — Ambulatory Visit: Payer: Managed Care, Other (non HMO) | Admitting: Internal Medicine

## 2022-05-14 ENCOUNTER — Encounter: Payer: Self-pay | Admitting: Internal Medicine

## 2022-05-14 VITALS — BP 100/60 | HR 79 | Temp 97.8°F | Resp 16 | Ht 63.0 in | Wt 139.2 lb

## 2022-05-14 DIAGNOSIS — Z79899 Other long term (current) drug therapy: Secondary | ICD-10-CM

## 2022-05-14 DIAGNOSIS — E039 Hypothyroidism, unspecified: Secondary | ICD-10-CM

## 2022-05-14 DIAGNOSIS — R300 Dysuria: Secondary | ICD-10-CM | POA: Diagnosis not present

## 2022-05-14 DIAGNOSIS — R0989 Other specified symptoms and signs involving the circulatory and respiratory systems: Secondary | ICD-10-CM | POA: Diagnosis not present

## 2022-05-14 DIAGNOSIS — E559 Vitamin D deficiency, unspecified: Secondary | ICD-10-CM

## 2022-05-14 DIAGNOSIS — E785 Hyperlipidemia, unspecified: Secondary | ICD-10-CM | POA: Diagnosis not present

## 2022-05-14 DIAGNOSIS — E1169 Type 2 diabetes mellitus with other specified complication: Secondary | ICD-10-CM

## 2022-05-14 DIAGNOSIS — E1140 Type 2 diabetes mellitus with diabetic neuropathy, unspecified: Secondary | ICD-10-CM

## 2022-05-14 MED ORDER — NITROFURANTOIN MONOHYD MACRO 100 MG PO CAPS: ORAL_CAPSULE | ORAL | 1 refills | Status: DC

## 2022-05-15 ENCOUNTER — Other Ambulatory Visit: Payer: Self-pay | Admitting: Internal Medicine

## 2022-05-15 DIAGNOSIS — N390 Urinary tract infection, site not specified: Secondary | ICD-10-CM

## 2022-05-15 DIAGNOSIS — E039 Hypothyroidism, unspecified: Secondary | ICD-10-CM

## 2022-05-15 LAB — CBC WITH DIFFERENTIAL/PLATELET
Absolute Monocytes: 559 cells/uL (ref 200–950)
Basophils Absolute: 41 cells/uL (ref 0–200)
Basophils Relative: 0.5 %
Eosinophils Absolute: 122 cells/uL (ref 15–500)
Eosinophils Relative: 1.5 %
HCT: 44.4 % (ref 35.0–45.0)
Hemoglobin: 14.9 g/dL (ref 11.7–15.5)
Lymphs Abs: 2260 cells/uL (ref 850–3900)
MCH: 31.3 pg (ref 27.0–33.0)
MCHC: 33.6 g/dL (ref 32.0–36.0)
MCV: 93.3 fL (ref 80.0–100.0)
MPV: 11 fL (ref 7.5–12.5)
Monocytes Relative: 6.9 %
Neutro Abs: 5119 cells/uL (ref 1500–7800)
Neutrophils Relative %: 63.2 %
Platelets: 310 10*3/uL (ref 140–400)
RBC: 4.76 10*6/uL (ref 3.80–5.10)
RDW: 12.8 % (ref 11.0–15.0)
Total Lymphocyte: 27.9 %
WBC: 8.1 10*3/uL (ref 3.8–10.8)

## 2022-05-15 LAB — COMPLETE METABOLIC PANEL WITH GFR
AG Ratio: 2.1 (calc) (ref 1.0–2.5)
ALT: 21 U/L (ref 6–29)
AST: 19 U/L (ref 10–35)
Albumin: 4.6 g/dL (ref 3.6–5.1)
Alkaline phosphatase (APISO): 44 U/L (ref 37–153)
BUN: 15 mg/dL (ref 7–25)
CO2: 27 mmol/L (ref 20–32)
Calcium: 9.6 mg/dL (ref 8.6–10.4)
Chloride: 103 mmol/L (ref 98–110)
Creat: 0.67 mg/dL (ref 0.50–1.03)
Globulin: 2.2 g/dL (calc) (ref 1.9–3.7)
Glucose, Bld: 82 mg/dL (ref 65–99)
Potassium: 4.4 mmol/L (ref 3.5–5.3)
Sodium: 137 mmol/L (ref 135–146)
Total Bilirubin: 0.5 mg/dL (ref 0.2–1.2)
Total Protein: 6.8 g/dL (ref 6.1–8.1)
eGFR: 103 mL/min/{1.73_m2} (ref >=60)

## 2022-05-15 LAB — LIPID PANEL
Cholesterol: 108 mg/dL (ref <200)
HDL: 66 mg/dL (ref >=50)
LDL Cholesterol (Calc): 25 mg/dL (calc)
Non-HDL Cholesterol (Calc): 42 mg/dL (calc) (ref <130)
Total CHOL/HDL Ratio: 1.6 (calc) (ref <5.0)
Triglycerides: 87 mg/dL (ref <150)

## 2022-05-15 LAB — HEMOGLOBIN A1C
Hgb A1c MFr Bld: 5.5 % of total Hgb (ref <5.7)
Mean Plasma Glucose: 111 mg/dL
eAG (mmol/L): 6.2 mmol/L

## 2022-05-15 LAB — VITAMIN D 25 HYDROXY (VIT D DEFICIENCY, FRACTURES): Vit D, 25-Hydroxy: 38 ng/mL (ref 30–100)

## 2022-05-15 LAB — INSULIN, RANDOM: Insulin: 12.8 u[IU]/mL

## 2022-05-15 LAB — TSH: TSH: 0.02 mIU/L — ABNORMAL LOW

## 2022-05-15 LAB — MAGNESIUM: Magnesium: 2.1 mg/dL (ref 1.5–2.5)

## 2022-05-15 NOTE — Progress Notes (Signed)
<><><><><><><><><><><><><><><><><><><><><><><><><><><><><><><><><> <><><><><><><><><><><><><><><><><><><><><><><><><><><><><><><><><>  -   U/A suspicious for UTI  -> will send for U/C  <><><><><><><><><><><><><><><><><><><><><><><><><><><><><><><><><>  - TSH - very low  -> suspicious for elevated thyroid hormone in blood , So m  - Please cut Levothyroxine 112 mcg down to 5 tabs/week. Ie  - Take 1 whole tablet 3 x / week                               ( Mon Wed Fri &                                                1/2 tab  4 x /week on Tues  Thurs)   - then call office to schedule a Nurse Visit in 1 month to re-check TSH  <><><><><><><><><><><><><><><><><><><><><><><><><><><><><><><><><>  -  Total Chol = 108    Z&   LDL Chol = 25   Excellent   - Very low risk for Heart Attack  / Stroke  ! <><><><><><><><><><><><><><><><><><><><><><><><><><><><><><><><><> <><><><><><><><><><><><><><><><><><><><><><><><><><><><><><><><><>  -  A1c - Normal - No Diabetes  - Great  ! <><><><><><><><><><><><><><><><><><><><><><><><><><><><><><><><><>  -  Vitamin D low - has gone down from 80 to now 38  - too low   - Vitamin D goal is between 70-100.   - Please make sure that you are taking your Vitamin D 10,000 units /day as directed.   - It is very important as a natural anti-inflammatory and helping the  immune system protect against viral infections, like the Covid-19    helping hair, skin, and nails, as well as reducing stroke and  heart attack risk.   - It helps your bones and helps with mood.  - It also decreases numerous cancer risks so please  take it as directed.   - Low Vit D is associated with a 200-300% higher risk for  CANCER   and 200-300% higher risk for HEART   ATTACK  &  STROKE.    - It is also associated with higher death rate at younger ages,   autoimmune diseases like Rheumatoid arthritis, Lupus,  Multiple Sclerosis.     - Also many other serious conditions, like  depression, Alzheimer's  Dementia, infertility, muscle aches, fatigue, fibromyalgia  <><><><><><><><><><><><><><><><><><><><><><><><><><><><><><><><><> <><><><><><><><><><><><><><><><><><><><><><><><><><><><><><><><><>  - All Else - CBC - Kidneys - Electrolytes - Liver & Magnesium  - all  Normal / OK <><><><><><><><><><><><><><><><><><><><><><><><><><><><><><><><><> <><><><><><><><><><><><><><><><><><><><><><><><><><><><><><><><><>

## 2022-05-17 ENCOUNTER — Other Ambulatory Visit: Payer: Self-pay | Admitting: Internal Medicine

## 2022-05-17 LAB — URINALYSIS, ROUTINE W REFLEX MICROSCOPIC
Bilirubin Urine: NEGATIVE
Hyaline Cast: NONE SEEN /LPF
Ketones, ur: NEGATIVE
Nitrite: NEGATIVE
RBC / HPF: NONE SEEN /HPF (ref 0–2)
Specific Gravity, Urine: 1.019 (ref 1.001–1.035)
Squamous Epithelial / HPF: NONE SEEN /HPF (ref <=5)
pH: <=5 (ref 5.0–8.0)

## 2022-05-17 LAB — URINE CULTURE
MICRO NUMBER:: 14002204
SPECIMEN QUALITY:: ADEQUATE

## 2022-05-17 LAB — MICROSCOPIC MESSAGE

## 2022-06-21 ENCOUNTER — Other Ambulatory Visit: Payer: Self-pay | Admitting: Internal Medicine

## 2022-07-12 ENCOUNTER — Ambulatory Visit: Payer: Managed Care, Other (non HMO) | Admitting: Orthopaedic Surgery

## 2022-07-17 ENCOUNTER — Other Ambulatory Visit: Payer: Self-pay | Admitting: Internal Medicine

## 2022-07-25 ENCOUNTER — Encounter: Payer: Self-pay | Admitting: Internal Medicine

## 2022-08-02 ENCOUNTER — Encounter: Payer: Self-pay | Admitting: Internal Medicine

## 2022-08-02 ENCOUNTER — Other Ambulatory Visit: Payer: Self-pay | Admitting: Internal Medicine

## 2022-08-02 DIAGNOSIS — E1140 Type 2 diabetes mellitus with diabetic neuropathy, unspecified: Secondary | ICD-10-CM

## 2022-08-02 MED ORDER — METFORMIN HCL ER 500 MG PO TB24: ORAL_TABLET | ORAL | 3 refills | Status: DC

## 2022-08-07 ENCOUNTER — Ambulatory Visit: Payer: Managed Care, Other (non HMO) | Admitting: Nurse Practitioner

## 2022-08-07 ENCOUNTER — Encounter: Payer: Self-pay | Admitting: Nurse Practitioner

## 2022-08-07 VITALS — BP 107/81 | HR 81 | Temp 97.3°F | Ht 63.0 in | Wt 148.8 lb

## 2022-08-07 DIAGNOSIS — H9222 Otorrhagia, left ear: Secondary | ICD-10-CM

## 2022-08-07 DIAGNOSIS — H663X2 Other chronic suppurative otitis media, left ear: Secondary | ICD-10-CM | POA: Diagnosis not present

## 2022-08-07 DIAGNOSIS — H9202 Otalgia, left ear: Secondary | ICD-10-CM | POA: Diagnosis not present

## 2022-08-07 MED ORDER — CIPROFLOXACIN-DEXAMETHASONE 0.3-0.1 % OT SUSP: 4.0000 [drp] | Freq: Two times a day (BID) | OTIC | 0 refills | Status: DC

## 2022-08-07 MED ORDER — TRAMADOL-ACETAMINOPHEN 37.5-325 MG PO TABS: 1.0000 | ORAL_TABLET | Freq: Three times a day (TID) | ORAL | 0 refills | Status: DC | PRN

## 2022-08-07 NOTE — Progress Notes (Signed)
Assessment and Plan:  Caitlin Barnett was seen today for an episodic visit.  Diagnoses and all order for this visit:  Chronic suppurative otitis media of left ear, unspecified otitis media location Continue heating pad. Discussed Tramadol however, patient currently on 3 controlled substances, Xanax, Fiorecet, Lyrica. Suggested Tylenol 1,000 mg up to QID. Continue IBU 800 mg PRN. Continue to monitor Referral to ENT for further review and evaluation.   - ciprofloxacin-dexamethasone (CIPRODEX) OTIC suspension; Place 4 drops into the left ear 2 (two) times daily.  Dispense: 7.5 mL; Refill: 0  2. Otalgia of left ear/bleeding of left ear.  Continue to monitor If increase in fever, chills, N/V contact office.    Orders Placed This Encounter  Procedures   Ambulatory referral to ENT    Referral Priority:   Routine    Referral Type:   Consultation    Referral Reason:   Specialty Services Required    Requested Specialty:   Otolaryngology    Number of Visits Requested:   1    Notify office for further evaluation and treatment, questions or concerns if s/s fail to improve. The risks and benefits of my recommendations, as well as other treatment options were discussed with the patient today. Questions were answered.  Further disposition pending results of labs. Discussed med's effects and SE's.    Over 15 minutes of exam, counseling, chart review, and critical decision making was performed.   Future Appointments  Date Time Provider [place]  08/22/2022  9:30 AM Caitlin Rossetti, NP [facility] None  12/19/2022  3:00 PM Caitlin Pinto, MD [facility] None    ------------------------------------------------------------------------------------------------------------------   HPI BP (!) 88/50   Pulse 81   Temp (!) 97.3 F (36.3 C)   Ht _0  (1.6 m)   Wt 148 lb 12.8 oz (67.5 kg)   SpO2 95%   BMI 26.36 kg/m    Patient presents with recurring ear infections, ear pain, ear  drainage of the left ear. Caitlin Barnett reports having had numerous episodes of otitis media in the past several years. The infections typically affect the left ear and are typically manifested by ear pain, ear drainage, ear perforation.  Prior antibiotic therapy has included Ciprodex which she had on hand and has been using with me relief.  She is also using a heating pad and taking 800 mg IBU. Middle ear fluid has been persistent for 2 days.  Her nasal symptoms consist of none of significance.  Reports following with ENT in the past but has not established since moving here.    Past Medical History:  Diagnosis Date   CHF (congestive heart failure) (HCC)    COVID-19    Diabetes mellitus without complication (HCC)    Diverticulitis    GERD (gastroesophageal reflux disease)    History of kidney stones    Hyperlipidemia    Hypertension    Hypothyroidism    IBS (irritable bowel syndrome)    Insomnia 12/10/2019   Kidney stone    Migraine    MRSA (methicillin resistant staph aureus) culture positive    Pneumonia 11/2017   PONV (postoperative nausea and vomiting)    Thyroid disease    UTI (urinary tract infection)      Allergies  Allergen Reactions   Tetanus Toxoids Other (See Comments)    Injection site abcess   Sulfa Antibiotics Hives    itching    Current Outpatient Medications on File Prior to Visit  Medication Sig   albuterol (PROVENTIL HFA;VENTOLIN  HFA) 108 (90 Base) MCG/ACT inhaler 2 inhalations  15-20  minutes apart every 4 hours as needed to rescue Asthma (Patient taking differently: Inhale 2 puffs into the lungs every 4 (four) hours as needed for wheezing or shortness of breath (asthma). Inhale 15-20  minutes apart)   albuterol (PROVENTIL) (2.5 MG/3ML) 0.083% nebulizer solution Take 3 mLs (2.5 mg total) by nebulization every 6 (six) hours as needed for wheezing or shortness of breath.   ALPRAZolam (XANAX) 0.5 MG tablet Take 1/2 - 1 tablet 2 - 3 x /day ONLY if needed for Anxiety  Attack &  limit to 5 days /week to avoid Addiction & Dementia                                            /                                                       TAKE                           BY                           MOUTH   blood glucose meter kit and supplies KIT Dispense based on patient and insurance preference. Use up to four times daily as directed. (FOR ICD-9 250.00, 250.01).   butalbital-acetaminophen-caffeine (FIORICET) 50-325-40 MG tablet Take 0.5 tablets by mouth every 4 (four) hours as needed for headache.   cetirizine (ZYRTEC) 10 MG tablet Take 10 mg by mouth daily.   Cholecalciferol 1000 units capsule Take 5,000 Units by mouth 2 (two) times daily.    cyanocobalamin (,VITAMIN B-12,) 1000 MCG/ML injection Inject 1,000 mcg into the muscle every 30 (thirty) days.   escitalopram (LEXAPRO) 20 MG tablet Take 1 tablet Daily for Mood   esomeprazole (NEXIUM) 40 MG capsule TAKE 1 CAPSULE DAILY TO PREVENT HEARTBURN & INDIGESTION   estrogen, conjugated,-medroxyprogesterone (PREMPRO) 0.45-1.5 MG tablet Take 1 tablet  Daily   fenofibrate (TRICOR) 145 MG tablet TAKE 1 TABLET DAILY FOR TRIGLYCERIDES (BLOOD FATS)   furosemide (LASIX) 40 MG tablet Take 0.5-1 tablets (20-40 mg total) by mouth daily as needed. NEED OV. (Patient taking differently: Take 20 mg by mouth daily.)   glucose blood test strip Test blood sugar once daily   icosapent Ethyl (VASCEPA) 1 g capsule Take  1 capsule 2 x /day for for Triglycerides (Blood Fats)  /      TAKE 1 CAPSULE BY MOUTH 2 TIMES DAILY. (Patient taking differently: Take 1 g by mouth 2 (two) times daily.)   JARDIANCE 25 MG TABS tablet TAKE 1 TABLET BY MOUTH DAILY FOR DIABETES   levothyroxine (SYNTHROID) 112 MCG tablet Take 112 mcg by mouth every morning.   Magnesium 250 MG TABS Take 250 mg by mouth daily.   metFORMIN (GLUCOPHAGE-XR) 500 MG 24 hr tablet Take 2 tablets 2 x/ day with meals  for Diabetes   nitrofurantoin, macrocrystal-monohydrate, (MACROBID) 100 MG  capsule Take  1 capsule  2 x /day  as directed for UTI   ondansetron (ZOFRAN) 8 MG tablet Take  1/2 to 1  tab  every 6 to 8 hours  as needed for Nausea or Vomiting   phenazopyridine (PYRIDIUM) 200 MG tablet Take 1 tab up to three times a day as needed for bladder pain. (Patient taking differently: Take 200 mg by mouth daily as needed for pain.)   phentermine (ADIPEX-P) 37.5 MG tablet Take 1 tablet every morning for Dieting & Weight  Loss   potassium chloride (KLOR-CON) 10 MEQ tablet TAKE 1 TABLET BY MOUTH EVERY DAY (Patient taking differently: Take 10 mEq by mouth daily as needed (low potassium).)   pravastatin (PRAVACHOL) 20 MG tablet TAKE 1 TABLET BY MOUTH EVERYDAY AT BEDTIME   pregabalin (LYRICA) 200 MG capsule TAKE 1 CAPSULE 3 TIMES A DAY FOR PAINFUL NEUROPATHY   Semaglutide, 2 MG/DOSE, (OZEMPIC, 2 MG/DOSE,) 8 MG/3ML SOPN Inject 2 mg into the skin once a week.   traMADol (ULTRAM) 50 MG tablet Take  1/2 to 1 tablet   every 4 to 6 hours   as needed for Severe Pain   traZODone (DESYREL) 150 MG tablet Take 1/2 to 1 tablet 1 hour before Bedtime if needed for Sleep (icd 10  Dx:  g47.00) (Patient taking differently: Take 150 mg by mouth at bedtime as needed for sleep. Take 1/2 to 1 tablet 1 hour before Bedtime if needed for Sleep (icd 10  Dx:  g47.00))   valACYclovir (VALTREX) 1000 MG tablet Take 1 tablet 3 x /day for Fever Blisters / Cold Sores (Patient taking differently: Take 1,000 mg by mouth daily as needed (fever blisters).)   zinc gluconate 50 MG tablet Take 50 mg by mouth daily.   No current facility-administered medications on file prior to visit.    ROS: all negative except what is noted in the HPI.   Physical Exam:  BP (!) 88/50   Pulse 81   Temp (!) 97.3 F (36.3 C)   Ht _0  (1.6 m)   Wt 148 lb 12.8 oz (67.5 kg)   SpO2 95%   BMI 26.36 kg/m   General Appearance: NAD.  Awake, conversant and cooperative. Eyes: PERRLA, EOMs intact.  Sclera white.  Conjunctiva without  erythema. Sinuses: No frontal/maxillary tenderness.  No nasal discharge. Nares patent.  ENT/Mouth: Ext aud canals clear.  :eft TM bulging and with mild erythema, bright red blood spot. Hearing intact.  Tenderness to eustachian tube area along left lateral neck.  Neck: Supple.  No masses, nodules or thyromegaly. Respiratory: Effort is regular with non-labored breathing. Breath sounds are equal bilaterally without rales, rhonchi, wheezing or stridor.  Cardio: RRR with no MRGs. Brisk peripheral pulses without edema.  Abdomen: Active BS in all four quadrants.  Soft and non-tender without guarding, rebound tenderness, hernias or masses. Lymphatics: Non tender without lymphadenopathy.  Musculoskeletal: Full ROM, 5/5 strength, normal ambulation.  No clubbing or cyanosis. Skin: Appropriate color for ethnicity. Warm without rashes, lesions, ecchymosis, ulcers.  Neuro: CN II-XII grossly normal. Normal muscle tone without cerebellar symptoms and intact sensation.   Psych: AO X 3,  appropriate mood and affect, insight and judgment.     Darrol Jump, NP 2:27 PM [facility]

## 2022-08-15 ENCOUNTER — Other Ambulatory Visit: Payer: Self-pay | Admitting: Internal Medicine

## 2022-08-21 ENCOUNTER — Other Ambulatory Visit: Payer: Self-pay | Admitting: Internal Medicine

## 2022-08-21 DIAGNOSIS — N951 Menopausal and female climacteric states: Secondary | ICD-10-CM

## 2022-08-21 DIAGNOSIS — Z780 Asymptomatic menopausal state: Secondary | ICD-10-CM

## 2022-08-22 ENCOUNTER — Ambulatory Visit: Payer: Managed Care, Other (non HMO) | Admitting: Nurse Practitioner

## 2022-08-22 NOTE — Progress Notes (Deleted)
FOLLOW UP  Assessment and Plan:   CHF Appears euvolemic today; Disease process and medications discussed. Questions answered fully. Emphasized salt restriction, less than 2081m a day. Encouraged daily monitoring of the patient's weight, call office if 5 lb weight loss or gain in a day.  Encouraged regular exercise. If any increasing shortness of breath, swelling, or chest pressure go to ER immediately.  decrease your fluid intake to less than 2 L daily please remember to always increase your potassium intake with any increase of your fluid pill.   Hypertension At goal;  Monitor blood pressure at home; patient to call if consistently greater than 130/80 Continue DASH diet.   Reminder to go to the ER if any CP, SOB, nausea, dizziness, severe HA, changes vision/speech, left arm numbness and tingling and jaw pain.  Hyperlipidemia with Type 2 Diabetes mellitus(HCC) Currently above goal; on fenofibrate, pravastatin 20 mg, advised she start on omega 3/fish oil supplement for trigs, consider vascepa Continue low cholesterol diet and exercise.  Check lipid panel.   Diabetes with other circulatory complications Continue medication: metformin,  jardiance 25 mg daily Continue diet and exercise.  Perform daily foot/skin check, notify office of any concerning changes.  Check A1C  Obesity with co morbidities Long discussion about weight loss, diet, and exercise Recommended diet heavy in fruits and veggies and low in animal meats, cheeses, and dairy products, appropriate calorie intake Discussed ideal weight for height  Will follow up in 3 months   Hypothyroidism continue medications the same pending lab results reminded to take on an empty stomach 30-617ms before food.  check TSH level  Vitamin D Def Below goal at last visit; she has changed her dose continue supplementation to maintain goal of 70-100 Check Vit D level    Continue diet and meds as discussed. Further disposition  pending results of labs. Discussed med's effects and SE's.   Over 30 minutes of exam, counseling, chart review, and critical decision making was performed.   Future Appointments  Date Time Provider [place]1/07/2023 10:30 AM WiAlycia RossettiNP [facility] None  12/19/2022  3:00 PM McUnk PintoMD [facility] None    ----------------------------------------------------------------------------------------------------------------------  HPI [age].o. female  presents for 3 month follow up on hypertension, cholesterol, diabetes, hypothyroid, obesity and vitamin D deficiency.   Patient was hospitalized Dec 13- Dec 20 with Covid Pneumonia and Acute respiratory failure and fortunately never required mechanical ventilation, was discharged and recovered slowly. Today she reports has significant residual exertional dyspnea. Was improving, then seems to be getting worse in the last month; she reports dyspnea just walking up the steps of her home. Will get short of breath while speaking during a meeting at work. Occasional tightness in chest resolves with albuterol, but no improvement with exertional tolerance. Mild non-productive cough in the last few weeks. She did have normal CXR following admission on 08/18/2019 and normal EKG on 10/06/2019.    Also persistent bil knee and hip pain and weakness (saw ortho, did have xrays, recommended PT and was given home exercises for weakness). She does feel this helped some, but pain in knees and hips limits her. Also has neuropathic distal burning, has been prescribed lyrica 75 mg caps, per Dr. McMelford Aasenstructions she reports taking 2 tabs AM and PM, 1 cap with lunch.   BMI is There is no height or weight on file to calculate BMI., she has been working on diet and exercise, working on cutting carbs, has been unable to tolerate  significant exertion, minimal exercise at this time Trying to make healthy choices, protein with veggies, does admit to potatoes and  pasta Water only, no alcohol It is her birthday today and reports had cake earlier today, but this is atypical for her, minimal sweets Wt Readings from Last 3 Encounters:  08/07/22 148 lb 12.8 oz (67.5 kg)  05/14/22 139 lb 3.2 oz (63.1 kg)  11/07/21 139 lb 12.8 oz (63.4 kg)   She was abdmitted in 11/2017 with LLL pneumonia; 2D ECHO showed normal LV function with grade 1 diastolic dysfunction. Remains on oral lasix 20 mg and well controlled, weights stable, minimal edema other than after increased sodium, weights stable, denies PND.   Her blood pressure has been controlled at home, today their BP is    She does not workout. She denies chest pain, dizziness. She does have dyspnea with minimal exertion.    She is on cholesterol medication Pravastatin and Fenofibrate and denies myalgias. Her cholesterol is not at goal. The cholesterol last visit was:   Lab Results  Component Value Date   CHOL 108 05/14/2022   HDL 66 05/14/2022   LDLCALC 25 05/14/2022   TRIG 87 05/14/2022   CHOLHDL 1.6 05/14/2022    She has been working on diet and exercise for T2 diabetes treated by metformin 1000 mg BID, jardiance 25 mg and denies hyperglycemia, hypoglycemia , increased appetite, nausea, paresthesia of the feet, polydipsia, polyuria and visual disturbances.  She does check fasting glucose, ranges 110-160, this is typical for her Has glipizide to take, 5 mg if needed for atypical elevations Last A1C in the office was:  Lab Results  Component Value Date   HGBA1C 5.5 05/14/2022   She is on thyroid medication. Her medication was not changed last visit. Takes 125 mcg daily except skips Wednesday, unchanged for several years  Lab Results  Component Value Date   TSH 0.02 (L) 05/14/2022   Patient is on Vitamin D supplement.   Lab Results  Component Value Date   VD25OH 38 05/14/2022     She has been taking 1000 mcg monthly injections, since last low check added daily sublingual  Lab Results  Component  Value Date   VITAMINB12 >2,000 (H) 11/07/2021    Current Medications:  Current Outpatient Medications on File Prior to Visit  Medication Sig   albuterol (PROVENTIL HFA;VENTOLIN HFA) 108 (90 Base) MCG/ACT inhaler 2 inhalations  15-20  minutes apart every 4 hours as needed to rescue Asthma (Patient taking differently: Inhale 2 puffs into the lungs every 4 (four) hours as needed for wheezing or shortness of breath (asthma). Inhale 15-20  minutes apart)   albuterol (PROVENTIL) (2.5 MG/3ML) 0.083% nebulizer solution Take 3 mLs (2.5 mg total) by nebulization every 6 (six) hours as needed for wheezing or shortness of breath.   ALPRAZolam (XANAX) 0.5 MG tablet Take 1/2 - 1 tablet 2 - 3 x /day ONLY if needed for Anxiety Attack &  limit to 5 days /week to avoid Addiction & Dementia                                            /  TAKE                           BY                           MOUTH   blood glucose meter kit and supplies KIT Dispense based on patient and insurance preference. Use up to four times daily as directed. (FOR ICD-9 250.00, 250.01).   butalbital-acetaminophen-caffeine (FIORICET) 50-325-40 MG tablet Take 0.5 tablets by mouth every 4 (four) hours as needed for headache.   cetirizine (ZYRTEC) 10 MG tablet Take 10 mg by mouth daily.   Cholecalciferol 1000 units capsule Take 5,000 Units by mouth 2 (two) times daily.    ciprofloxacin-dexamethasone (CIPRODEX) OTIC suspension Place 4 drops into the left ear 2 (two) times daily.   cyanocobalamin (,VITAMIN B-12,) 1000 MCG/ML injection Inject 1,000 mcg into the muscle every 30 (thirty) days.   escitalopram (LEXAPRO) 20 MG tablet TAKE 1 TABLET BY MOUTH DAILY FOR MOOD   esomeprazole (NEXIUM) 40 MG capsule TAKE 1 CAPSULE DAILY TO PREVENT HEARTBURN & INDIGESTION   estrogen, conjugated,-medroxyprogesterone (PREMPRO) 0.45-1.5 MG tablet Take 1 tablet  Daily   fenofibrate (TRICOR) 145 MG tablet TAKE 1  TABLET DAILY FOR TRIGLYCERIDES (BLOOD FATS)   furosemide (LASIX) 40 MG tablet Take 0.5-1 tablets (20-40 mg total) by mouth daily as needed. NEED OV. (Patient taking differently: Take 20 mg by mouth daily.)   glucose blood test strip Test blood sugar once daily   icosapent Ethyl (VASCEPA) 1 g capsule Take  1 capsule 2 x /day for for Triglycerides (Blood Fats)  /      TAKE 1 CAPSULE BY MOUTH 2 TIMES DAILY. (Patient taking differently: Take 1 g by mouth 2 (two) times daily.)   JARDIANCE 25 MG TABS tablet TAKE 1 TABLET BY MOUTH DAILY FOR DIABETES   levothyroxine (SYNTHROID) 112 MCG tablet Take 112 mcg by mouth every morning.   Magnesium 250 MG TABS Take 250 mg by mouth daily.   metFORMIN (GLUCOPHAGE-XR) 500 MG 24 hr tablet Take 2 tablets 2 x/ day with meals  for Diabetes   nitrofurantoin, macrocrystal-monohydrate, (MACROBID) 100 MG capsule Take  1 capsule  2 x /day  as directed for UTI   ondansetron (ZOFRAN) 8 MG tablet Take  1/2 to 1 tab  every 6 to 8 hours  as needed for Nausea or Vomiting   phenazopyridine (PYRIDIUM) 200 MG tablet Take 1 tab up to three times a day as needed for bladder pain. (Patient taking differently: Take 200 mg by mouth daily as needed for pain.)   phentermine (ADIPEX-P) 37.5 MG tablet Take 1 tablet every morning for Dieting & Weight  Loss   potassium chloride (KLOR-CON) 10 MEQ tablet TAKE 1 TABLET BY MOUTH EVERY DAY (Patient taking differently: Take 10 mEq by mouth daily as needed (low potassium).)   pravastatin (PRAVACHOL) 20 MG tablet TAKE 1 TABLET BY MOUTH EVERYDAY AT BEDTIME   pregabalin (LYRICA) 200 MG capsule TAKE 1 CAPSULE 3 TIMES A DAY FOR PAINFUL NEUROPATHY   Semaglutide, 2 MG/DOSE, (OZEMPIC, 2 MG/DOSE,) 8 MG/3ML SOPN Inject 2 mg into the skin once a week.   traMADol-acetaminophen (ULTRACET) 37.5-325 MG tablet Take 1 tablet by mouth every 8 (eight) hours as needed.   valACYclovir (VALTREX) 1000 MG tablet Take 1 tablet 3 x /day for Fever Blisters / Cold Sores (Patient  taking differently: Take 1,000 mg by mouth  daily as needed (fever blisters).)   zinc gluconate 50 MG tablet Take 50 mg by mouth daily.   No current facility-administered medications on file prior to visit.     Allergies:  Allergies  Allergen Reactions   Tetanus Toxoids Other (See Comments)    Injection site abcess   Sulfa Antibiotics Hives    itching     Medical History:  Past Medical History:  Diagnosis Date   CHF (congestive heart failure) ([place])    COVID-19    Diabetes mellitus without complication (HCC)    Diverticulitis    GERD (gastroesophageal reflux disease)    History of kidney stones    Hyperlipidemia    Hypertension    Hypothyroidism    IBS (irritable bowel syndrome)    Insomnia 12/10/2019   Kidney stone    Migraine    MRSA (methicillin resistant staph aureus) culture positive    Pneumonia 11/2017   PONV (postoperative nausea and vomiting)    Thyroid disease    UTI (urinary tract infection)    Family history- Reviewed and unchanged Social history- Reviewed and unchanged   Review of Systems:  Review of Systems  Constitutional:  Positive for malaise/fatigue. Negative for chills, fever and weight loss.  HENT:  Negative for congestion, ear discharge, ear pain, hearing loss, sore throat and tinnitus.   Eyes:  Negative for blurred vision and double vision.  Respiratory:  Positive for cough (mild, non-productive, intermittent ) and shortness of breath (with minimal exertion). Negative for hemoptysis, sputum production and wheezing.   Cardiovascular:  Negative for chest pain, palpitations, orthopnea, claudication, leg swelling and PND.  Gastrointestinal:  Negative for abdominal pain, blood in stool, constipation, diarrhea, heartburn, melena, nausea and vomiting.  Genitourinary: Negative.   Musculoskeletal:  Positive for joint pain (bil knees, hips, saw ortho). Negative for myalgias.  Skin:  Negative for rash.  Neurological:  Negative for dizziness, tingling,  sensory change, focal weakness, weakness and headaches.  Endo/Heme/Allergies:  Negative for polydipsia.  Psychiatric/Behavioral: Negative.    All other systems reviewed and are negative.   Physical Exam: There were no vitals taken for this visit. Wt Readings from Last 3 Encounters:  08/07/22 148 lb 12.8 oz (67.5 kg)  05/14/22 139 lb 3.2 oz (63.1 kg)  11/07/21 139 lb 12.8 oz (63.4 kg)   General Appearance: Well nourished, in no apparent distress. Eyes: PERRLA, EOMs, conjunctiva no swelling or erythema Sinuses: No Frontal/maxillary tenderness ENT/Mouth: Ext aud canals clear, TMs without erythema, bulging. No erythema, swelling, or exudate on post pharynx.  Tonsils not swollen or erythematous. Hearing normal.  Neck: Supple, thyroid normal.  Respiratory: Respiratory effort normal, BS equal bilaterally without rales, rhonchi, wheezing or stridor.  Cardio: RRR with no MRGs. Brisk peripheral pulses without edema.  Abdomen: Soft, + BS.  Non tender, no guarding, rebound, hernias, masses. Lymphatics: Non tender without lymphadenopathy.   Musculoskeletal: Full ROM, no obvious deformity or effusion, symmetrical strength, Normal gait Skin: Warm, dry without rashes, lesions, ecchymosis.  Neuro: Cranial nerves intact. No cerebellar symptoms.  Psych: Awake and oriented X 3, normal affect, Insight and Judgment appropriate.    Alycia Rossetti, NP 1:00 PM [facility]

## 2022-08-22 NOTE — Telephone Encounter (Signed)
Please advise pt her insurance will not cover Prempro.  Is she willing to try a different medication

## 2022-08-23 ENCOUNTER — Ambulatory Visit: Payer: Managed Care, Other (non HMO) | Admitting: Nurse Practitioner

## 2022-08-23 DIAGNOSIS — E669 Obesity, unspecified: Secondary | ICD-10-CM

## 2022-08-23 DIAGNOSIS — I5032 Chronic diastolic (congestive) heart failure: Secondary | ICD-10-CM

## 2022-08-23 DIAGNOSIS — E039 Hypothyroidism, unspecified: Secondary | ICD-10-CM

## 2022-08-23 DIAGNOSIS — E1140 Type 2 diabetes mellitus with diabetic neuropathy, unspecified: Secondary | ICD-10-CM

## 2022-08-23 DIAGNOSIS — Z79899 Other long term (current) drug therapy: Secondary | ICD-10-CM

## 2022-08-23 DIAGNOSIS — E559 Vitamin D deficiency, unspecified: Secondary | ICD-10-CM

## 2022-08-23 DIAGNOSIS — R0989 Other specified symptoms and signs involving the circulatory and respiratory systems: Secondary | ICD-10-CM

## 2022-08-23 DIAGNOSIS — E1169 Type 2 diabetes mellitus with other specified complication: Secondary | ICD-10-CM

## 2022-08-25 ENCOUNTER — Other Ambulatory Visit: Payer: Self-pay | Admitting: Internal Medicine

## 2022-09-02 ENCOUNTER — Ambulatory Visit: Payer: Managed Care, Other (non HMO) | Admitting: Internal Medicine

## 2022-09-02 ENCOUNTER — Encounter: Payer: Self-pay | Admitting: Internal Medicine

## 2022-09-02 VITALS — BP 100/60 | HR 89 | Temp 97.9°F | Resp 16 | Ht 63.0 in | Wt 143.6 lb

## 2022-09-02 DIAGNOSIS — E785 Hyperlipidemia, unspecified: Secondary | ICD-10-CM

## 2022-09-02 DIAGNOSIS — Z79899 Other long term (current) drug therapy: Secondary | ICD-10-CM | POA: Diagnosis not present

## 2022-09-02 DIAGNOSIS — E559 Vitamin D deficiency, unspecified: Secondary | ICD-10-CM

## 2022-09-02 DIAGNOSIS — E1165 Type 2 diabetes mellitus with hyperglycemia: Secondary | ICD-10-CM

## 2022-09-02 DIAGNOSIS — E1169 Type 2 diabetes mellitus with other specified complication: Secondary | ICD-10-CM | POA: Diagnosis not present

## 2022-09-02 DIAGNOSIS — R0989 Other specified symptoms and signs involving the circulatory and respiratory systems: Secondary | ICD-10-CM | POA: Diagnosis not present

## 2022-09-02 DIAGNOSIS — E039 Hypothyroidism, unspecified: Secondary | ICD-10-CM

## 2022-09-02 DIAGNOSIS — M26623 Arthralgia of bilateral temporomandibular joint: Secondary | ICD-10-CM

## 2022-09-02 DIAGNOSIS — E1129 Type 2 diabetes mellitus with other diabetic kidney complication: Secondary | ICD-10-CM | POA: Diagnosis not present

## 2022-09-02 DIAGNOSIS — H7291 Unspecified perforation of tympanic membrane, right ear: Secondary | ICD-10-CM

## 2022-09-02 DIAGNOSIS — H6523 Chronic serous otitis media, bilateral: Secondary | ICD-10-CM

## 2022-09-02 MED ORDER — TIRZEPATIDE 10 MG/0.5ML ~~LOC~~ SOAJ: SUBCUTANEOUS | 0 refills | Status: DC

## 2022-09-02 MED ORDER — DEXAMETHASONE 4 MG PO TABS: ORAL_TABLET | ORAL | 0 refills | Status: DC

## 2022-09-02 NOTE — Progress Notes (Signed)
Future Appointments  Date Time Provider Department  09/02/2022              9 mo ov  4:00 PM Unk Pinto, MD [facility]  12/19/2022                cpe  3:00 PM Unk Pinto, MD [facility]    History of Present Illness:       This very nice 57 y.o. MWF  presents for  6 month follow up with HTN, HLD, Hypothyroidism, T2_NIDDM and Vitamin D Deficiency.        Patient also relates remote & long standing hx/o bilateral recurrent ear infections & perforations relating prior Lt tympanoplasties  and chronic perforation of the Rt TM. Currently, she has been having recurrent infections of the ears and has ENT referral pending from 08/07/22 OV.        Patient is followed expectantly  for labile HTN  circa 2020  & BP has been controlled at home. Today's BP is  at goal - 100/60 .   Patient has had no complaints of any cardiac type chest pain, palpitations, dyspnea Vertell Limber /PND, dizziness, claudication or dependent edema.        Hyperlipidemia is controlled with diet & Fenofibrate Andree Elk Juliane Lack. Patient denies myalgias or other med SE's. Last Lipids were at goal :  Lab Results  Component Value Date   CHOL 108 05/14/2022   HDL 66 05/14/2022   LDLCALC 25 05/14/2022   TRIG 87 05/14/2022   CHOLHDL 1.6 05/14/2022     Patient has hx/o remote Gestational DM  ([place]), then in honeymoon til 2016 when started on Metformin.  After hospitalization in Dec 2020 w/Covid PNA  when  treated with steroids exacerbating hyperglycemia necessitating insulin for glucose control and unfortunate weight gain. Since then, she  has been on Metformin, Jardiance and Ozempic to recently discover her [employer] no longer will cover her Ozempic.  She has had no symptoms of reactive hypoglycemia, diabetic polys, paresthesias or visual blurring.  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                                                           Further, the patient also has history of Vitamin D Deficiency  ("8" /2018) and supplements vitamin D without any suspected side-effects. Last vitamin D was not at goal (70-100) :  Lab Results  Component Value Date   VD25OH 38 05/14/2022  Patient was dx'd Hypothyroid at age 61 yo in 44 & has been on thyroid replacement since.     Current Outpatient Medications  Medication Instructions   albuterol HFA  inhaler 2 inhalations   every 4 hours as needed to rescue Asthma   albuterol    2.5 mg, Nebulization Every 6 hours PRN   ALPRAZolam 0.5 MG tablet Take 1/2 - 1 tablet 2 - 3 x /day ONLY if needed for Anxiety Attack    FIORICET 50-325-40 MG tablet 0.5 tablets Every 4 hours PRN   cetirizine 10 mg Daily   Cholecalciferol 5,000 u 2 times daily   CIPRODEX OTIC suspension 4 drops, Left EAR, 2 times daily   VITAMIN B12 1,000 mcg, Intramuscular, Every 30 days   LEXAPRO  20 MG tablet TAKE 1 TABLET DAILY    NEXIUM 40 MG capsule TAKE 1 CAPSULE DAILY    fenofibrate  145 MG tablet TAKE 1 TABLET DAILY    furosemide (LASIX) 20-40 mg, Oral, Daily PRN, NEED OV.   VASCEPA 1 g capsule Take  1 capsule 2 x /day       JARDIANCE 25 MG TABS tablet TAKE 1 TABLET DAILY    levothyroxine  112 mcg, Oral, Every morning   Magnesium 250 mg, Oral, Daily   metFORMIN -XR 500 MG  Take 2 tablets 2 x/ day with meals    norethindrone-ethinyl estradiol (FEMHRT 1/5) 1 tablet  Daily   ondansetron 8 MG tablet Take  1/2 to 1 tab  every 6 to 8 hours  as needed for Nausea    Ozempic (2 MG/DOSE) 2 mg, SQ, Weekly   phenazopyridine  200 MG tablet Take 1 tab up to three times a day as needed for bladder pain.   phentermine  37.5 MG tablet Take 1 tablet every morning for Dieting & Weight  Loss   potassium chloride 20 meq TAKE 1 TABLET EVERY DAY   pravastatin 20 MG tablet TAKE 1 TABLET  AT BEDTIME   LYRICA 200 MG capsule TAKE 1 CAPSULE 3 TIMES A DAY    traMADol-APAP  (ULTRACET) 37.5-325 MG  1 tablet, Oral, Every 8 hours PRN   valACYclovir 1000 MG tablet Take 1 tablet 3 x /day for Fever Blisters /Cold Sores   Zinc    50 mg   Daily     Allergies  Allergen Reactions   Tetanus Toxoids Other (See Comments)    Injection site abcess   Sulfa Antibiotics Hives    itching     PMHx:   Past Medical History:  Diagnosis Date   COVID-19    Diabetes mellitus without complication (HCC)    Diverticulitis    GERD (gastroesophageal reflux disease)    Hyperlipidemia    IBS (irritable bowel syndrome)    Insomnia 12/10/2019   Migraine    Pneumonia 11/2017   Thyroid disease    UTI (urinary tract infection)      Immunization History  Administered Date(s) Administered   Influenza Inj Mdck Quad  07/22/2017, 05/14/2018   PFIZER  Covid-19 Tri-Sucrose Vacc 09/16/2020   PFIZER SARS-COV-2 Vacc 12/30/2019, 01/27/2020   Pneumococcal -23 08/20/2018     Past Surgical History:  Procedure Laterality Date   CESAREAN SECTION  1993   cystoscopies     multiple   FUNCTIONAL ENDOSCOPIC SINUS SURGERY  2003   LAPAROSCOPIC OVARIAN CYSTECTOMY  1983   TONSILLECTOMY  1973   TUBAL LIGATION     TYMPANOPLASTY W/ MASTOIDECTOMY Left 2016 & 2017  FHx:    Reviewed / unchanged  SHx:    Reviewed / unchanged   Systems Review:  Constitutional: Denies fever, chills, wt changes, headaches, insomnia, fatigue, night sweats, change in appetite. Eyes: Denies redness, blurred vision, diplopia, discharge, itchy, watery eyes.  ENT: Denies discharge, congestion, post nasal drip, epistaxis, sore throat,  dental pain, tinnitus, vertigo, sinus pain,  snoring. Relates bilat TMJ aching & "poor" hearing.  CV: Denies chest pain, palpitations, irregular heartbeat, syncope, dyspnea, diaphoresis, orthopnea, PND, claudication or edema. Respiratory: denies cough, dyspnea, DOE, pleurisy, hoarseness, laryngitis, wheezing.  Gastrointestinal: Denies dysphagia, odynophagia, heartburn, reflux, water brash, abdominal pain or cramps, nausea, vomiting, bloating, diarrhea, constipation, hematemesis, melena, hematochezia  or hemorrhoids. Genitourinary: Denies dysuria, frequency, urgency, nocturia, hesitancy, discharge, hematuria or flank pain. Musculoskeletal: Denies arthralgias, myalgias, stiffness, jt. swelling, pain, limping or strain/sprain.  Skin: Denies pruritus, rash, hives, warts, acne, eczema or change in skin lesion(s). Neuro: No weakness, tremor, incoordination, spasms, paresthesia or pain. Psychiatric: Denies confusion, memory loss or sensory loss. Endo: Denies change in weight, skin or hair change.  Heme/Lymph: No excessive bleeding, bruising or enlarged lymph nodes.  Physical Exam  BP 100/60   Pulse 89   Temp 97.9 F (36.6 C)   Resp 16   Ht 5\' 3"  (1.6 m)   Wt 143 lb 9.6 oz (65.1 kg)   SpO2 98%   BMI 25.44 kg/m   Appears  well nourished, well groomed  and in no distress.  Eyes: PERRLA, EOMs, conjunctiva no swelling or erythema. Sinuses: No frontal/maxillary tenderness. Bilat TMJt tenderness.  ENT/Mouth: EAC's clear, TM's retracted Left and ? Defect on Right. Nares clear w/o erythema, swelling, exudates. Oropharynx clear without erythema or exudates. Oral hygiene is good. Tongue normal, non obstructing. Hearing intact.  Neck: Supple. Thyroid not palpable. Car 2+/2+ without bruits, nodes or JVD. Chest: Respirations nl with BS clear & equal w/o rales, rhonchi, wheezing or stridor.  Cor: Heart sounds normal w/ regular rate and rhythm without sig. murmurs, gallops, clicks or rubs. Peripheral pulses normal and equal  without edema.  Abdomen:  Soft & bowel sounds normal. Non-tender w/o guarding, rebound, hernias, masses or organomegaly.  Lymphatics: Unremarkable.  Musculoskeletal: Full ROM all peripheral extremities, joint stability, 5/5 strength and normal gait.  Skin: Warm, dry without exposed rashes, lesions or ecchymosis apparent.  Neuro: Cranial nerves intact, reflexes equal bilaterally. Sensory-motor testing grossly intact. Tendon reflexes grossly intact.  Pysch: Alert & oriented x 3.  Insight and judgement nl & appropriate. No ideations.  Assessment and Plan:  1. Labile hypertension  - Continue medication, monitor blood pressure at home.  - Continue DASH diet.  Reminder to go to the ER if any CP,  SOB, nausea, dizziness, severe HA, changes vision/speech.   - CBC with Differential/Platelet - COMPLETE METABOLIC PANEL WITH GFR - Magnesium - TSH   2. Hyperlipidemia associated with type 2 diabetes mellitus ([place])   - Continue diet/meds, exercise,& lifestyle modifications.  - Continue monitor periodic cholesterol/liver & renal functions    - Lipid panel - TSH   3. 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  - Monitor appropriate lab   - Hemoglobin A1c - Insulin, random -  D/C Ozempic - tirzepatide (MOUNJARO) 10 MG/0.5ML Pen;     Inject 1 pen (10 mg) into the Skin  every 7 days for Diabetes  Dispense: 2 mL; Refill: 0   4. Vitamin D deficiency    - Continue supplementation.   - VITAMIN D 25 Hydroxy    5. Hypothyroidism  - TSH   6. Bilateral chronic serous otitis media   7. Perforated tympanic membrane on examination, right   8. Bilateral temporomandibular joint pain  - dexamethasone  4 MG tablet;  Take 1/2 to  tab  3 x day  as directed for  TMJ pain   Dispense: 30 tablet; Refill: 0  9. Medication management  - CBC with Differential/Platelet - COMPLETE  METABOLIC PANEL WITH GFR - Magnesium - Lipid panel - TSH - Hemoglobin A1c - Insulin, random - VITAMIN D 25 Hydroxy         Discussed  regular exercise, BP monitoring, weight control to achieve/maintain BMI less than 25 and discussed med and SE's. Recommended labs to assess and monitor clinical status with further disposition pending results of labs.  I discussed the assessment and treatment plan with the patient. The patient was provided an opportunity to ask questions and all were answered. The patient agreed with the plan and demonstrated an understanding of the instructions.  I provided over 30 minutes of exam, counseling, chart review and  complex critical decision making.        The patient was advised to call back or seek an in-person evaluation if the symptoms worsen or if the condition fails to improve as anticipated.   Marinus Maw, MD

## 2022-09-02 NOTE — Patient Instructions (Signed)

## 2022-09-03 LAB — COMPLETE METABOLIC PANEL WITH GFR
AG Ratio: 2.1 (calc) (ref 1.0–2.5)
ALT: 33 U/L — ABNORMAL HIGH (ref 6–29)
AST: 31 U/L (ref 10–35)
Albumin: 5.3 g/dL — ABNORMAL HIGH (ref 3.6–5.1)
Alkaline phosphatase (APISO): 53 U/L (ref 37–153)
BUN: 16 mg/dL (ref 7–25)
CO2: 30 mmol/L (ref 20–32)
Calcium: 11.4 mg/dL — ABNORMAL HIGH (ref 8.6–10.4)
Chloride: 97 mmol/L — ABNORMAL LOW (ref 98–110)
Creat: 0.83 mg/dL (ref 0.50–1.03)
Globulin: 2.5 g/dL (calc) (ref 1.9–3.7)
Glucose, Bld: 79 mg/dL (ref 65–99)
Potassium: 4.6 mmol/L (ref 3.5–5.3)
Sodium: 137 mmol/L (ref 135–146)
Total Bilirubin: 0.6 mg/dL (ref 0.2–1.2)
Total Protein: 7.8 g/dL (ref 6.1–8.1)
eGFR: 83 mL/min/{1.73_m2} (ref >=60)

## 2022-09-03 LAB — LIPID PANEL
Cholesterol: 114 mg/dL (ref <200)
HDL: 70 mg/dL (ref >=50)
LDL Cholesterol (Calc): 25 mg/dL (calc)
Non-HDL Cholesterol (Calc): 44 mg/dL (calc) (ref <130)
Total CHOL/HDL Ratio: 1.6 (calc) (ref <5.0)
Triglycerides: 111 mg/dL (ref <150)

## 2022-09-03 LAB — CBC WITH DIFFERENTIAL/PLATELET
Absolute Monocytes: 595 cells/uL (ref 200–950)
Basophils Absolute: 63 cells/uL (ref 0–200)
Basophils Relative: 0.9 %
Eosinophils Absolute: 77 cells/uL (ref 15–500)
Eosinophils Relative: 1.1 %
HCT: 44.6 % (ref 35.0–45.0)
Hemoglobin: 15.5 g/dL (ref 11.7–15.5)
Lymphs Abs: 2548 cells/uL (ref 850–3900)
MCH: 31.1 pg (ref 27.0–33.0)
MCHC: 34.8 g/dL (ref 32.0–36.0)
MCV: 89.4 fL (ref 80.0–100.0)
MPV: 11.1 fL (ref 7.5–12.5)
Monocytes Relative: 8.5 %
Neutro Abs: 3717 cells/uL (ref 1500–7800)
Neutrophils Relative %: 53.1 %
Platelets: 345 10*3/uL (ref 140–400)
RBC: 4.99 10*6/uL (ref 3.80–5.10)
RDW: 13.1 % (ref 11.0–15.0)
Total Lymphocyte: 36.4 %
WBC: 7 10*3/uL (ref 3.8–10.8)

## 2022-09-03 LAB — HEMOGLOBIN A1C
Hgb A1c MFr Bld: 5.8 % of total Hgb — ABNORMAL HIGH (ref <5.7)
Mean Plasma Glucose: 120 mg/dL
eAG (mmol/L): 6.6 mmol/L

## 2022-09-03 LAB — TSH: TSH: 3.57 mIU/L

## 2022-09-03 LAB — VITAMIN D 25 HYDROXY (VIT D DEFICIENCY, FRACTURES): Vit D, 25-Hydroxy: 72 ng/mL (ref 30–100)

## 2022-09-03 LAB — MAGNESIUM: Magnesium: 1.9 mg/dL (ref 1.5–2.5)

## 2022-09-03 LAB — INSULIN, RANDOM: Insulin: 12.8 u[IU]/mL

## 2022-09-03 NOTE — Progress Notes (Signed)
<><><><><><><><><><><><><><><><><><><><><><><><><><><><><><><><><> <><><><><><><><><><><><><><><><><><><><><><><><><><><><><><><><><> -   Test results slightly outside the reference range are not unusual. If there is anything important, I will review this with you,  otherwise it is considered normal test values.  If you have further questions,  please do not hesitate to contact me at the office or via My Chart.  <><><><><><><><><><><><><><><><><><><><><><><><><><><><><><><><><> <><><><><><><><><><><><><><><><><><><><><><><><><><><><><><><><><>  -  A1c = 5.8% - slightly elevated  <><><><><><><><><><><><><><><><><><><><><><><><><><><><><><><><><>  -  Total Chol = 114    &  LDL Chol = 25  - WOW  !    =-=-=-=-=-=-=-=-=-=-=-=-=-=-=-=-=-=-=-=-=-=-=-=-=-=-=-=-=-=-=-=-=-=-=-=-=-=-=-=-=-=-  - OK to stop your Pravastatin & Fenofibrate  <><><><><><><><><><><><><><><><><><><><><><><><><><><><><><><><><>  -  Vitamin D = 72 - Excellent   - Please keep dose same  <><><><><><><><><><><><><><><><><><><><><><><><><><><><><><><><><>  -  All Else - CBC - Kidneys - Electrolytes - Liver - Magnesium & Thyroid    - all  Normal / OK <><><><><><><><><><><><><><><><><><><><><><><><><><><><><><><><><>  -  Keep up the [place] Work  !   <><><><><><><><><><><><><><><><><><><><><><><><><><><><><><><><><> <><><><><><><><><><><><><><><><><><><><><><><><><><><><><><><><><>

## 2022-09-09 ENCOUNTER — Encounter: Payer: Self-pay | Admitting: Internal Medicine

## 2022-09-20 ENCOUNTER — Other Ambulatory Visit: Payer: Self-pay | Admitting: Nurse Practitioner

## 2022-09-20 ENCOUNTER — Other Ambulatory Visit: Payer: Self-pay | Admitting: Internal Medicine

## 2022-09-20 DIAGNOSIS — H663X2 Other chronic suppurative otitis media, left ear: Secondary | ICD-10-CM

## 2022-10-08 ENCOUNTER — Other Ambulatory Visit: Payer: Self-pay | Admitting: Internal Medicine

## 2022-10-08 ENCOUNTER — Encounter: Payer: Self-pay | Admitting: Nurse Practitioner

## 2022-10-08 ENCOUNTER — Other Ambulatory Visit: Payer: Self-pay

## 2022-10-08 ENCOUNTER — Ambulatory Visit (INDEPENDENT_AMBULATORY_CARE_PROVIDER_SITE_OTHER): Payer: Managed Care, Other (non HMO) | Admitting: Nurse Practitioner

## 2022-10-08 DIAGNOSIS — R062 Wheezing: Secondary | ICD-10-CM | POA: Diagnosis not present

## 2022-10-08 DIAGNOSIS — J101 Influenza due to other identified influenza virus with other respiratory manifestations: Secondary | ICD-10-CM | POA: Diagnosis not present

## 2022-10-08 DIAGNOSIS — R0602 Shortness of breath: Secondary | ICD-10-CM | POA: Diagnosis not present

## 2022-10-08 DIAGNOSIS — Z79899 Other long term (current) drug therapy: Secondary | ICD-10-CM

## 2022-10-08 DIAGNOSIS — Z1152 Encounter for screening for COVID-19: Secondary | ICD-10-CM | POA: Diagnosis not present

## 2022-10-08 DIAGNOSIS — R6889 Other general symptoms and signs: Secondary | ICD-10-CM | POA: Diagnosis not present

## 2022-10-08 DIAGNOSIS — R058 Other specified cough: Secondary | ICD-10-CM

## 2022-10-08 LAB — POCT INFLUENZA A/B
Influenza A, POC: NEGATIVE
Influenza B, POC: POSITIVE — AB

## 2022-10-08 LAB — POCT RESPIRATORY SYNCYTIAL VIRUS: RSV Rapid Ag: NEGATIVE

## 2022-10-08 LAB — POC COVID19 BINAXNOW: SARS Coronavirus 2 Ag: NEGATIVE

## 2022-10-08 MED ORDER — ACETAMINOPHEN-CODEINE 300-30 MG PO TABS: ORAL_TABLET | ORAL | 0 refills | Status: DC

## 2022-10-08 MED ORDER — PREDNISONE 20 MG PO TABS: ORAL_TABLET | ORAL | 0 refills | Status: DC

## 2022-10-08 MED ORDER — ALBUTEROL SULFATE (2.5 MG/3ML) 0.083% IN NEBU: 2.5000 mg | INHALATION_SOLUTION | Freq: Four times a day (QID) | RESPIRATORY_TRACT | 12 refills | Status: AC | PRN

## 2022-10-08 MED ORDER — BENZONATATE 200 MG PO CAPS: ORAL_CAPSULE | ORAL | 1 refills | Status: AC

## 2022-10-08 MED ORDER — BENZONATATE 100 MG PO CAPS: 100.0000 mg | ORAL_CAPSULE | Freq: Three times a day (TID) | ORAL | 1 refills | Status: DC | PRN

## 2022-10-08 MED ORDER — ALBUTEROL SULFATE HFA 108 (90 BASE) MCG/ACT IN AERS: INHALATION_SPRAY | RESPIRATORY_TRACT | 3 refills | Status: AC

## 2022-10-08 MED ORDER — BREZTRI AEROSPHERE 160-9-4.8 MCG/ACT IN AERO: 2.0000 | INHALATION_SPRAY | Freq: Two times a day (BID) | RESPIRATORY_TRACT | 1 refills | Status: DC

## 2022-10-08 NOTE — Progress Notes (Signed)
THIS ENCOUNTER IS A VIRTUAL VISIT DUE TO INFLUENZA - PATIENT WAS NOT SEEN IN THE OFFICE.  PATIENT HAS CONSENTED TO VIRTUAL VISIT / TELEMEDICINE VISIT   Virtual Visit via telephone Note  I connected with  Docia Furl on 10/08/2022 by telephone.  I verified that I am speaking with the correct person using two identifiers.    I discussed the limitations of evaluation and management by telemedicine and the availability of in person appointments. The patient expressed understanding and agreed to proceed.  History of Present Illness:  There were no vitals taken for this visit. 57 y.o. patient contacted office reporting URI sx congestion, sob, cough, sore throat, chills, HA, body aches since 10/04/22. she tested positive by office rapid flu test. OV was conducted by telephone to minimize exposure. This patient has not been recently vaccinated for the flu.  Sx began 5 days ago with chest and nasal congestion.  Treatments tried so far: mucinex  Exposures: unknown   Medications  Current Outpatient Medications (Endocrine & Metabolic):    dexamethasone (DECADRON) 4 MG tablet, Take 1/2 to  tab  3 x day  as directed for  TMJ pain   JARDIANCE 25 MG TABS tablet, TAKE 1 TABLET BY MOUTH DAILY FOR DIABETES   levothyroxine (SYNTHROID) 112 MCG tablet, Take 112 mcg by mouth every morning.   metFORMIN (GLUCOPHAGE-XR) 500 MG 24 hr tablet, Take 2 tablets 2 x/ day with meals  for Diabetes   norethindrone-ethinyl estradiol (FEMHRT 1/5) 1-5 MG-MCG TABS tablet, Take 1 tablet by mouth daily.   tirzepatide [facility]) 10 MG/0.5ML Pen, Inject 1 pen (10 mg) into the Skin  every 7 days for Diabetes      (Dx: e11.29)  Current Outpatient Medications (Cardiovascular):    fenofibrate (TRICOR) 145 MG tablet, TAKE 1 TABLET DAILY FOR TRIGLYCERIDES (BLOOD FATS)   furosemide (LASIX) 40 MG tablet, Take 0.5-1 tablets (20-40 mg total) by mouth daily as needed. NEED OV. (Patient taking differently: Take 20 mg by mouth daily.)    icosapent Ethyl (VASCEPA) 1 g capsule, Take  1 capsule 2 x /day for for Triglycerides (Blood Fats)  /      TAKE 1 CAPSULE BY MOUTH 2 TIMES DAILY. (Patient taking differently: Take 1 g by mouth 2 (two) times daily.)   pravastatin (PRAVACHOL) 20 MG tablet, TAKE 1 TABLET BY MOUTH EVERYDAY AT BEDTIME  Current Outpatient Medications (Respiratory):    albuterol (PROVENTIL HFA;VENTOLIN HFA) 108 (90 Base) MCG/ACT inhaler, 2 inhalations  15-20  minutes apart every 4 hours as needed to rescue Asthma (Patient taking differently: Inhale 2 puffs into the lungs every 4 (four) hours as needed for wheezing or shortness of breath (asthma). Inhale 15-20  minutes apart)   albuterol (PROVENTIL) (2.5 MG/3ML) 0.083% nebulizer solution, Take 3 mLs (2.5 mg total) by nebulization every 6 (six) hours as needed for wheezing or shortness of breath.   cetirizine (ZYRTEC) 10 MG tablet, Take 10 mg by mouth daily.  Current Outpatient Medications (Analgesics):    butalbital-acetaminophen-caffeine (FIORICET) 50-325-40 MG tablet, Take 0.5 tablets by mouth every 4 (four) hours as needed for headache.   traMADol-acetaminophen (ULTRACET) 37.5-325 MG tablet, Take 1 tablet by mouth every 8 (eight) hours as needed.  Current Outpatient Medications (Hematological):    cyanocobalamin (,VITAMIN B-12,) 1000 MCG/ML injection, Inject 1,000 mcg into the muscle every 30 (thirty) days.  Current Outpatient Medications (Other):    ALPRAZolam (XANAX) 0.5 MG tablet, Take 1/2 - 1 tablet 2 - 3 x /day ONLY  if needed for Anxiety Attack &  limit to 5 days /week to avoid Addiction & Dementia                                            /                                                       TAKE                           BY                           MOUTH   blood glucose meter kit and supplies KIT, Dispense based on patient and insurance preference. Use up to four times daily as directed. (FOR ICD-9 250.00, 250.01).   Cholecalciferol 1000 units capsule, Take  5,000 Units by mouth 2 (two) times daily.    ciprofloxacin-dexamethasone (CIPRODEX) OTIC suspension, Place 4 drops into the left ear 2 (two) times daily.   escitalopram (LEXAPRO) 20 MG tablet, TAKE 1 TABLET BY MOUTH DAILY FOR MOOD   esomeprazole (NEXIUM) 40 MG capsule, TAKE 1 CAPSULE DAILY TO PREVENT HEARTBURN & INDIGESTION   glucose blood test strip, Test blood sugar once daily   Magnesium 250 MG TABS, Take 250 mg by mouth daily.   ondansetron (ZOFRAN) 8 MG tablet, TAKE 1/2 TO 1 TAB EVERY 6 TO 8 HOURS AS NEEDED FOR NAUSEA OR VOMITING   phenazopyridine (PYRIDIUM) 200 MG tablet, Take 1 tab up to three times a day as needed for bladder pain. (Patient taking differently: Take 200 mg by mouth daily as needed for pain.)   phentermine (ADIPEX-P) 37.5 MG tablet, Take 1 tablet every morning for Dieting & Weight  Loss   potassium chloride (KLOR-CON) 10 MEQ tablet, TAKE 1 TABLET BY MOUTH EVERY DAY (Patient taking differently: Take 10 mEq by mouth daily as needed (low potassium).)   pregabalin (LYRICA) 200 MG capsule, TAKE 1 CAPSULE 3 TIMES A DAY FOR PAINFUL NEUROPATHY   valACYclovir (VALTREX) 1000 MG tablet, Take 1 tablet 3 x /day for Fever Blisters / Cold Sores (Patient taking differently: Take 1,000 mg by mouth daily as needed (fever blisters).)   zinc gluconate 50 MG tablet, Take 50 mg by mouth daily.  Allergies:  Allergies  Allergen Reactions   Tetanus Toxoids Other (See Comments)    Injection site abcess   Sulfa Antibiotics Hives    itching    Problem list She has Hypothyroidism; Type 2 diabetes mellitus ([place]); Vitamin D deficiency; Postmenopausal; Hot flashes due to menopause; Vitamin B12 deficiency; Peripheral neuropathy; Hyperlipidemia associated with type 2 diabetes mellitus ([place]); GERD (gastroesophageal reflux disease); Obesity (BMI 30.0-34.9); Bilateral lower extremity edema; Chronic diastolic (congestive) heart failure ([place]); Hepatic steatosis; Chronic suppurative otitis media of left ear;  Fatigue; Former smoker; FHx: heart disease; Class 1 obesity due to excess calories with serious comorbidity and body mass index (BMI) of 31.0 to 31.9 in adult; Hypertension; Insomnia; and Dyspnea on minimal exertion on their problem list.   Social History:   reports that she quit smoking about 5 years ago. Her smoking use included cigarettes.  She has a 15.00 pack-year smoking history. She has never used smokeless tobacco. She reports that she does not drink alcohol and does not use drugs.  Observations/Objective:  General : Well sounding patient in no apparent distress HEENT: hoarse, no cough for duration of visit Lungs: speaks in complete sentences, no audible wheezing, no apparent distress Neurological: alert, oriented x 3 Psychiatric: pleasant, judgement appropriate   Assessment and Plan:  Influenza B Influenza B positive per rapid screening test in office Risk factors include:  Past Medical History:  Diagnosis Date   CHF (congestive heart failure) ([place])    COVID-19    Diabetes mellitus without complication ([place])    Diverticulitis    GERD (gastroesophageal reflux disease)    History of kidney stones    Hyperlipidemia    Hypertension    Hypothyroidism    IBS (irritable bowel syndrome)    Insomnia 12/10/2019   Kidney stone    Migraine    MRSA (methicillin resistant staph aureus) culture positive    Pneumonia 11/2017   PONV (postoperative nausea and vomiting)    Thyroid disease    UTI (urinary tract infection)    Symptoms are: moderate Due to co morbid conditions and risk factors, discussed antivirals  Immue support reviewed Vitamin C, Zinc, Vitamin D. Take tylenol PRN temp 101+ Push hydration Sx supportive therapy suggested Follow up via mychart or telephone if needed Advised patient obtain O2 monitor; present to ED if persistently <90% or with severe dyspnea, CP, fever uncontrolled by tylenol, confusion, sudden decline     Flu-like symptoms/Influenza B Positive  Influenza B  - POCT Influenza A/B  Encounter for screening for COVID-19 Negative  - POC COVID-19  Medication management All medications discussed and reviewed in full. All questions and concerns regarding medications addressed.     Shortness of breath RSV Negative  - Budeson-Glycopyrrol-Formoterol (BREZTRI AEROSPHERE) 160-9-4.8 MCG/ACT AERO; Inhale 2 puffs into the lungs in the morning and at bedtime.  Dispense: 10.7 g; Refill: 1 - predniSONE (DELTASONE) 20 MG tablet; Take 2 tabs (40 mg) for 3 days followed by 1 tab (20 mg) for 4 days.  Dispense: 10 tablet; Refill: 0 - albuterol (VENTOLIN HFA) 108 (90 Base) MCG/ACT inhaler; 2 inhalations  15-20  minutes apart every 4 hours as needed to rescue Asthma  Dispense: 3 each; Refill: 3 - albuterol (PROVENTIL) (2.5 MG/3ML) 0.083% nebulizer solution; Take 3 mLs (2.5 mg total) by nebulization every 6 (six) hours as needed for wheezing or shortness of breath.  Dispense: 75 mL; Refill: 12 - POCT respiratory syncytial virus  Productive cough RSV Negative  - benzonatate (TESSALON PERLES) 100 MG capsule; Take 1 capsule (100 mg total) by mouth 3 (three) times daily as needed for cough.  Dispense: 30 capsule; Refill: 1 - POCT respiratory syncytial virus  Wheezing  - albuterol (VENTOLIN HFA) 108 (90 Base) MCG/ACT inhaler; 2 inhalations  15-20  minutes apart every 4 hours as needed to rescue Asthma  Dispense: 3 each; Refill: 3   Follow Up Instructions:  I discussed the assessment and treatment plan with the patient. The patient was provided an opportunity to ask questions and all were answered. The patient agreed with the plan and demonstrated an understanding of the instructions.   The patient was advised to call back or seek an in-person evaluation if the symptoms worsen or if the condition fails to improve as anticipated.  I provided 15 minutes of non-face-to-face time during this encounter.   Darrol Jump, NP

## 2022-10-10 ENCOUNTER — Other Ambulatory Visit: Payer: Self-pay | Admitting: Nurse Practitioner

## 2022-10-10 ENCOUNTER — Other Ambulatory Visit: Payer: Self-pay | Admitting: Internal Medicine

## 2022-10-10 MED ORDER — ACETAMINOPHEN-CODEINE 300-60 MG PO TABS: ORAL_TABLET | ORAL | 0 refills | Status: DC

## 2022-10-11 ENCOUNTER — Other Ambulatory Visit: Payer: Self-pay | Admitting: Internal Medicine

## 2022-10-11 DIAGNOSIS — R058 Other specified cough: Secondary | ICD-10-CM

## 2022-10-11 MED ORDER — TRAMADOL HCL 50 MG PO TABS: ORAL_TABLET | ORAL | 0 refills | Status: DC

## 2022-10-20 ENCOUNTER — Other Ambulatory Visit: Payer: Self-pay | Admitting: Internal Medicine

## 2022-10-20 DIAGNOSIS — J0140 Acute pansinusitis, unspecified: Secondary | ICD-10-CM

## 2022-10-20 DIAGNOSIS — E1122 Type 2 diabetes mellitus with diabetic chronic kidney disease: Secondary | ICD-10-CM

## 2022-10-20 DIAGNOSIS — E1129 Type 2 diabetes mellitus with other diabetic kidney complication: Secondary | ICD-10-CM

## 2022-10-20 MED ORDER — TIRZEPATIDE 12.5 MG/0.5ML ~~LOC~~ SOAJ: SUBCUTANEOUS | 0 refills | Status: DC

## 2022-10-20 MED ORDER — AZITHROMYCIN 250 MG PO TABS: ORAL_TABLET | ORAL | 1 refills | Status: DC

## 2022-10-23 ENCOUNTER — Other Ambulatory Visit: Payer: Self-pay | Admitting: Internal Medicine

## 2022-10-23 DIAGNOSIS — E1122 Type 2 diabetes mellitus with diabetic chronic kidney disease: Secondary | ICD-10-CM

## 2022-10-24 ENCOUNTER — Other Ambulatory Visit: Payer: Self-pay | Admitting: Nurse Practitioner

## 2022-10-24 DIAGNOSIS — E1122 Type 2 diabetes mellitus with diabetic chronic kidney disease: Secondary | ICD-10-CM

## 2022-10-28 ENCOUNTER — Other Ambulatory Visit: Payer: Self-pay | Admitting: Nurse Practitioner

## 2022-10-28 DIAGNOSIS — E1122 Type 2 diabetes mellitus with diabetic chronic kidney disease: Secondary | ICD-10-CM

## 2022-11-14 ENCOUNTER — Encounter: Payer: Managed Care, Other (non HMO) | Admitting: Internal Medicine

## 2022-11-16 ENCOUNTER — Other Ambulatory Visit: Payer: Self-pay | Admitting: Nurse Practitioner

## 2022-11-27 ENCOUNTER — Other Ambulatory Visit: Payer: Self-pay | Admitting: Internal Medicine

## 2022-11-27 ENCOUNTER — Encounter: Payer: Self-pay | Admitting: Nurse Practitioner

## 2022-11-27 ENCOUNTER — Other Ambulatory Visit: Payer: Self-pay | Admitting: Nurse Practitioner

## 2022-11-27 DIAGNOSIS — E6609 Other obesity due to excess calories: Secondary | ICD-10-CM

## 2022-11-27 MED ORDER — CEPHALEXIN 250 MG PO CAPS: ORAL_CAPSULE | ORAL | 0 refills | Status: DC

## 2022-11-27 MED ORDER — PHENTERMINE HCL 37.5 MG PO TABS: ORAL_TABLET | ORAL | 0 refills | Status: DC

## 2022-12-03 ENCOUNTER — Encounter: Payer: Self-pay | Admitting: Internal Medicine

## 2022-12-19 ENCOUNTER — Encounter: Payer: Managed Care, Other (non HMO) | Admitting: Internal Medicine

## 2022-12-30 ENCOUNTER — Encounter: Payer: Self-pay | Admitting: Nurse Practitioner

## 2022-12-30 ENCOUNTER — Ambulatory Visit (INDEPENDENT_AMBULATORY_CARE_PROVIDER_SITE_OTHER): Payer: Managed Care, Other (non HMO) | Admitting: Nurse Practitioner

## 2022-12-30 ENCOUNTER — Other Ambulatory Visit: Payer: Self-pay | Admitting: Nurse Practitioner

## 2022-12-30 VITALS — BP 108/66 | HR 80 | Temp 97.6°F | Ht 63.0 in | Wt 151.6 lb

## 2022-12-30 DIAGNOSIS — Z87891 Personal history of nicotine dependence: Secondary | ICD-10-CM

## 2022-12-30 DIAGNOSIS — Z130 Encounter for screening for diseases of the blood and blood-forming organs and certain disorders involving the immune mechanism: Secondary | ICD-10-CM

## 2022-12-30 DIAGNOSIS — E559 Vitamin D deficiency, unspecified: Secondary | ICD-10-CM

## 2022-12-30 DIAGNOSIS — R0989 Other specified symptoms and signs involving the circulatory and respiratory systems: Secondary | ICD-10-CM

## 2022-12-30 DIAGNOSIS — Z780 Asymptomatic menopausal state: Secondary | ICD-10-CM

## 2022-12-30 DIAGNOSIS — Z0001 Encounter for general adult medical examination with abnormal findings: Secondary | ICD-10-CM

## 2022-12-30 DIAGNOSIS — R300 Dysuria: Secondary | ICD-10-CM | POA: Diagnosis not present

## 2022-12-30 DIAGNOSIS — R600 Localized edema: Secondary | ICD-10-CM

## 2022-12-30 DIAGNOSIS — Z1389 Encounter for screening for other disorder: Secondary | ICD-10-CM

## 2022-12-30 DIAGNOSIS — Z13220 Encounter for screening for lipoid disorders: Secondary | ICD-10-CM

## 2022-12-30 DIAGNOSIS — Z131 Encounter for screening for diabetes mellitus: Secondary | ICD-10-CM

## 2022-12-30 DIAGNOSIS — G629 Polyneuropathy, unspecified: Secondary | ICD-10-CM

## 2022-12-30 DIAGNOSIS — N951 Menopausal and female climacteric states: Secondary | ICD-10-CM

## 2022-12-30 DIAGNOSIS — K219 Gastro-esophageal reflux disease without esophagitis: Secondary | ICD-10-CM

## 2022-12-30 DIAGNOSIS — Z136 Encounter for screening for cardiovascular disorders: Secondary | ICD-10-CM | POA: Diagnosis not present

## 2022-12-30 DIAGNOSIS — E669 Obesity, unspecified: Secondary | ICD-10-CM

## 2022-12-30 DIAGNOSIS — Z79899 Other long term (current) drug therapy: Secondary | ICD-10-CM | POA: Diagnosis not present

## 2022-12-30 DIAGNOSIS — Z0000 Encounter for general adult medical examination without abnormal findings: Secondary | ICD-10-CM | POA: Diagnosis not present

## 2022-12-30 DIAGNOSIS — E1165 Type 2 diabetes mellitus with hyperglycemia: Secondary | ICD-10-CM

## 2022-12-30 DIAGNOSIS — E66811 Obesity, class 1: Secondary | ICD-10-CM

## 2022-12-30 DIAGNOSIS — E538 Deficiency of other specified B group vitamins: Secondary | ICD-10-CM

## 2022-12-30 DIAGNOSIS — K760 Fatty (change of) liver, not elsewhere classified: Secondary | ICD-10-CM

## 2022-12-30 DIAGNOSIS — I5032 Chronic diastolic (congestive) heart failure: Secondary | ICD-10-CM

## 2022-12-30 DIAGNOSIS — E1169 Type 2 diabetes mellitus with other specified complication: Secondary | ICD-10-CM

## 2022-12-30 LAB — CBC WITH DIFFERENTIAL/PLATELET
Basophils Absolute: 46 cells/uL (ref 0–200)
Lymphs Abs: 2880 cells/uL (ref 850–3900)
MCV: 91.8 fL (ref 80.0–100.0)
MPV: 11.1 fL (ref 7.5–12.5)
RDW: 13.2 % (ref 11.0–15.0)
Total Lymphocyte: 37.4 %

## 2022-12-30 MED ORDER — SEMAGLUTIDE(0.25 OR 0.5MG/DOS) 2 MG/3ML ~~LOC~~ SOPN
0.5000 mg | PEN_INJECTOR | SUBCUTANEOUS | 2 refills | Status: DC
Start: 2022-12-30 — End: 2023-03-27

## 2022-12-30 MED ORDER — PREMPRO 0.45-1.5 MG PO TABS
ORAL_TABLET | ORAL | 0 refills | Status: DC
Start: 2022-12-30 — End: 2023-06-19

## 2022-12-30 NOTE — Patient Instructions (Signed)

## 2022-12-30 NOTE — Progress Notes (Signed)
Complete Physical  Assessment and Plan:  1. Encounter for general adult medical examination with abnormal findings Due annually Health  maintenance reviewed   2. Labile hypertension Discussed DASH (Dietary Approaches to Stop Hypertension) DASH diet is lower in sodium than a typical American diet. Cut back on foods that are high in saturated fat, cholesterol, and trans fats. Eat more whole-grain foods, fish, poultry, and nuts Remain active and exercise as tolerated daily.  Monitor BP at home-Call if greater than 130/80.  Check CMP/CBC  - CBC with Differential/Platelet - COMPLETE METABOLIC PANEL WITH GFR - EKG 16-XWRU  3. Hot flashes due to menopause Stop FEMHRT Restart Prempro  - estrogen, conjugated,-medroxyprogesterone (PREMPRO) 0.45-1.5 MG tablet; Take 1 tablet  Daily  Dispense: 84 tablet; Refill: 0  4. Chronic diastolic (congestive) heart failure (HCC) Keep BP well controlled.   Remain active  - EKG 12-Lead  5. Gastroesophageal reflux disease, unspecified whether esophagitis present No suspected reflux complications ([place]/stricture). Lifestyle modification:  wt loss, avoid meals 2-3h before bedtime. Consider eliminating food triggers:  chocolate, caffeine, EtOH, acid/spicy food.  - Magnesium  6. Hepatic steatosis Avoid alcohol and Tylenol   - COMPLETE METABOLIC PANEL WITH GFR  7. Type 2 diabetes mellitus with hyperglycemia, without long-term current use of insulin (HCC) Stop Mounjaro Start Ozempic  - Hemoglobin A1c - Insulin, random - Urinalysis, Routine w reflex microscopic - Microalbumin / creatinine urine ratio - Semaglutide,0.25 or 0.5MG /DOS, 2 MG/3ML SOPN; Inject 0.5 mg into the skin once a week.  Dispense: 3 mL; Refill: 2  8. Hyperlipidemia associated with type 2 diabetes mellitus (HCC) Discussed lifestyle modifications. Recommended diet heavy in fruits and veggies, omega 3's. Decrease consumption of animal meats, cheeses, and dairy  products. Remain active and exercise as tolerated. Continue to monitor. Check lipids/TSH  - Lipid panel - TSH  9. Peripheral polyneuropathy Continue Lyrica Keep BG well controlled Continue to monitor  10. Vitamin D deficiency Continue supplement for goal of 60-100 Monitor Vitamin D levels  - VITAMIN D 25 Hydroxy (Vit-D Deficiency, Fractures)  11. Vitamin B12 deficiency  - Vitamin B12  12. Obesity (BMI 30.0-34.9) Discussed appropriate BMI Diet modification. Physical activity. Encouraged/praised to build confidence.   13. Bilateral lower extremity edema Feet elevation Avoid salt Remain active  14. Former smoker Continue to monitor  15. Dysuria Remain well hydrated to keep urinary system well flushed.  - Urine Culture  16. Medication management All medications discussed and reviewed in full. All questions and concerns regarding medications addressed.    - CBC with Differential/Platelet - COMPLETE METABOLIC PANEL WITH GFR - Magnesium - Lipid panel - TSH - Hemoglobin A1c - Insulin, random - VITAMIN D 25 Hydroxy (Vit-D Deficiency, Fractures) - EKG 12-Lead - Urinalysis, Routine w reflex microscopic - Microalbumin / creatinine urine ratio - Vitamin B12 - Urine Culture  17. Postmenopausal  - estrogen, conjugated,-medroxyprogesterone (PREMPRO) 0.45-1.5 MG tablet; Take 1 tablet  Daily  Dispense: 84 tablet; Refill: 0   Notify office for further evaluation and treatment, questions or concerns if any reported s/s fail to improve.   The patient was advised to call back or seek an in-person evaluation if any symptoms worsen or if the condition fails to improve as anticipated.   Further disposition pending results of labs. Discussed med's effects and SE's.    I discussed the assessment and treatment plan with the patient. The patient was provided an opportunity to ask questions and all were answered. The patient agreed with the plan and demonstrated an  understanding of the instructions.  Discussed med's effects and SE's. Screening labs and tests as requested with regular follow-up as recommended.  I provided 35 minutes of face-to-face time during this encounter including counseling, chart review, and critical decision making was preformed.  Today's Plan of Care is based on a patient-centered health care approach known as shared decision making - the decisions, tests and treatments allow for patient preferences and values to be balanced with clinical evidence.     Future Appointments  Date Time Provider Department Center  12/24/2023  3:00 PM [name], MD [facility] None    HPI  57 y.o. female  presents for a complete physical. She has Hypothyroidism; Type 2 diabetes mellitus (HCC); Vitamin D deficiency; Postmenopausal; Hot flashes due to menopause; Vitamin B12 deficiency; Peripheral neuropathy; Hyperlipidemia associated with type 2 diabetes mellitus (HCC); GERD (gastroesophageal reflux disease); Obesity (BMI 30.0-34.9); Bilateral lower extremity edema; Chronic diastolic (congestive) heart failure (HCC); Hepatic steatosis; Chronic suppurative otitis media of left ear; Fatigue; Former smoker; FHx: heart disease; Class 1 obesity due to excess calories with serious comorbidity and body mass index (BMI) of 31.0 to 31.9 in adult; Hypertension; Insomnia; and Dyspnea on minimal exertion on their problem list.  She is concerned for excessive hair thinning.  She is unsure if this is related to thyroid levels, which have a hx of being uncontrolled.  She is not taking Levothyroxine, or if this is due to her switching from Ozempic to [place].  Hair comes out in handfuls when brushing and washing.  This has occurred for the past 6-7 weeks and was most notable when staring Mounjaro.    She has been switching to [facility] from Prempro and reports medication is not effective at controlling the hot flashes.  She is requesting to switch back.  She has  noticed increase in urinary frequency and dysuria.  Trying to stay well hydrated.     She has a hx of GERD which is well controlled with Esomeprazole.  She reports mood is stable with Lexapro.  Denies any extreme mood swings, HI/SI.  Reports rare Xanax use, only PRN.    BMI is Body mass index is 26.85 kg/m., she has not been working on diet and exercise.  She continues Adipex.   Wt Readings from Last 3 Encounters:  12/30/22 151 lb 9.6 oz (68.8 kg)  09/02/22 143 lb 9.6 oz (65.1 kg)  08/07/22 148 lb 12.8 oz (67.5 kg)   Her blood pressure has been controlled at home, today their BP is BP: 108/66 She does workout. She denies chest pain, shortness of breath, dizziness.   She is not on cholesterol medication and denies myalgias. Her cholesterol is at goal. The cholesterol last visit was:   Lab Results  Component Value Date   CHOL 114 09/02/2022   HDL 70 09/02/2022   LDLCALC 25 09/02/2022   TRIG 111 09/02/2022   CHOLHDL 1.6 09/02/2022   She has not been working on diet and exercise for DM, she is not on bASA, she is not on ACE/ARB and denies hyperglycemia and hypoglycemia . Currently taking Jardiance.  Last A1C in the office was:  Lab Results  Component Value Date   HGBA1C 5.8 (H) 09/02/2022   Last GFR: Lab Results  Component Value Date   EGFR 83 09/02/2022   Patient is on Vitamin D supplement.   Lab Results  Component Value Date   VD25OH 72 09/02/2022      Current Medications:  Current Outpatient Medications on File  Prior to Visit  Medication Sig Dispense Refill   traMADol (ULTRAM) 50 MG tablet Take 1/2 to 1 tablet every 4 hours as needed for Severe Cough 30 tablet 0   albuterol (PROVENTIL) (2.5 MG/3ML) 0.083% nebulizer solution Take 3 mLs (2.5 mg total) by nebulization every 6 (six) hours as needed for wheezing or shortness of breath. 75 mL 12   albuterol (VENTOLIN HFA) 108 (90 Base) MCG/ACT inhaler 2 inhalations  15-20  minutes apart every 4 hours as needed to rescue Asthma  3 each 3   ALPRAZolam (XANAX) 0.5 MG tablet Take 1/2 - 1 tablet 2 - 3 x /day ONLY if needed for Anxiety Attack &  limit to 5 days /week to avoid Addiction & Dementia                                            /                                                       TAKE                           BY                           MOUTH 90 tablet 0   azithromycin (ZITHROMAX) 250 MG tablet Take 2 tablets with Food on  Day 1, then 1 tablet Daily with Food for Sinusitis 6 each 1   benzonatate (TESSALON) 200 MG capsule Take 1 perle  (200 mg) 3 x/day to Prevent Cough 30 capsule 1   blood glucose meter kit and supplies KIT Dispense based on patient and insurance preference. Use up to four times daily as directed. (FOR ICD-9 250.00, 250.01). 1 each 0   Budeson-Glycopyrrol-Formoterol (BREZTRI AEROSPHERE) 160-9-4.8 MCG/ACT AERO Inhale 2 puffs into the lungs in the morning and at bedtime. 10.7 g 1   butalbital-acetaminophen-caffeine (FIORICET) 50-325-40 MG tablet Take 0.5 tablets by mouth every 4 (four) hours as needed for headache. 20 tablet 0   cephALEXin (KEFLEX) 250 MG capsule Take 1 tab after intercourse as needed to prevent recurrent UTI. 30 capsule 0   cetirizine (ZYRTEC) 10 MG tablet Take 10 mg by mouth daily.     Cholecalciferol 1000 units capsule Take 5,000 Units by mouth 2 (two) times daily.      ciprofloxacin-dexamethasone (CIPRODEX) OTIC suspension Place 4 drops into the left ear 2 (two) times daily. 7.5 mL 0   cyanocobalamin (,VITAMIN B-12,) 1000 MCG/ML injection Inject 1,000 mcg into the muscle every 30 (thirty) days.     escitalopram (LEXAPRO) 20 MG tablet TAKE 1 TABLET BY MOUTH DAILY FOR MOOD 90 tablet 3   esomeprazole (NEXIUM) 40 MG capsule TAKE 1 CAPSULE DAILY TO PREVENT HEARTBURN & INDIGESTION 90 capsule 1   fenofibrate (TRICOR) 145 MG tablet TAKE 1 TABLET DAILY FOR TRIGLYCERIDES (BLOOD FATS) 90 tablet 3   furosemide (LASIX) 40 MG tablet Take 0.5-1 tablets (20-40 mg total) by mouth daily as needed.  NEED OV. (Patient taking differently: Take 20 mg by mouth daily.) 30 tablet 0   glucose blood test strip Test blood sugar once  daily 100 each 12   icosapent Ethyl (VASCEPA) 1 g capsule Take  1 capsule 2 x /day for for Triglycerides (Blood Fats)  /      TAKE 1 CAPSULE BY MOUTH 2 TIMES DAILY. (Patient taking differently: Take 1 g by mouth 2 (two) times daily.) 180 capsule 3   JARDIANCE 25 MG TABS tablet TAKE 1 TABLET BY MOUTH DAILY FOR DIABETES 90 tablet 3   levothyroxine (SYNTHROID) 112 MCG tablet Take 112 mcg by mouth every morning.     Magnesium 250 MG TABS Take 250 mg by mouth daily.     metFORMIN (GLUCOPHAGE-XR) 500 MG 24 hr tablet Take 2 tablets 2 x/ day with meals  for Diabetes 360 tablet 3   norethindrone-ethinyl estradiol (FEMHRT 1/5) 1-5 MG-MCG TABS tablet Take 1 tablet by mouth daily. 28 tablet 6   ondansetron (ZOFRAN) 8 MG tablet TAKE 1/2 TO 1 TAB EVERY 6 TO 8 HOURS AS NEEDED FOR NAUSEA OR VOMITING 20 tablet 0   phenazopyridine (PYRIDIUM) 200 MG tablet Take 1 tab up to three times a day as needed for bladder pain. (Patient taking differently: Take 200 mg by mouth daily as needed for pain.) 30 tablet 0   phentermine (ADIPEX-P) 37.5 MG tablet Take 1 tablet every morning for Dieting & Weight  Loss 30 tablet 0   potassium chloride (KLOR-CON) 10 MEQ tablet TAKE 1 TABLET BY MOUTH EVERY DAY (Patient taking differently: Take 10 mEq by mouth daily as needed (low potassium).) 90 tablet 3   pravastatin (PRAVACHOL) 20 MG tablet TAKE 1 TABLET BY MOUTH EVERYDAY AT BEDTIME 90 tablet 1   predniSONE (DELTASONE) 20 MG tablet Take 2 tabs (40 mg) for 3 days followed by 1 tab (20 mg) for 4 days. 10 tablet 0   pregabalin (LYRICA) 200 MG capsule TAKE 1 CAPSULE 3 TIMES A DAY FOR PAINFUL NEUROPATHY 270 capsule 1   tirzepatide (MOUNJARO) 12.5 MG/0.5ML Pen INJECT 1 PEN (12.5 MG) INTO SKIN EVERY 7 DAYS FOR DIABETES (DX: E11.29) 2 mL 2   valACYclovir (VALTREX) 1000 MG tablet Take 1 tablet 3 x /day for Fever Blisters /  Cold Sores (Patient taking differently: Take 1,000 mg by mouth daily as needed (fever blisters).) 270 tablet 0   zinc gluconate 50 MG tablet Take 50 mg by mouth daily.     No current facility-administered medications on file prior to visit.   Allergies:  Allergies  Allergen Reactions   Tetanus Toxoids Other (See Comments)    Injection site abcess   Sulfa Antibiotics Hives    itching   Medical History:  She has Hypothyroidism; Type 2 diabetes mellitus (HCC); Vitamin D deficiency; Postmenopausal; Hot flashes due to menopause; Vitamin B12 deficiency; Peripheral neuropathy; Hyperlipidemia associated with type 2 diabetes mellitus (HCC); GERD (gastroesophageal reflux disease); Obesity (BMI 30.0-34.9); Bilateral lower extremity edema; Chronic diastolic (congestive) heart failure (HCC); Hepatic steatosis; Chronic suppurative otitis media of left ear; Fatigue; Former smoker; FHx: heart disease; Class 1 obesity due to excess calories with serious comorbidity and body mass index (BMI) of 31.0 to 31.9 in adult; Hypertension; Insomnia; and Dyspnea on minimal exertion on their problem list.  Health Maintenance:   Immunization History  Administered Date(s) Administered   Influenza Inj Mdck Quad With Preservative 07/22/2017, 05/14/2018   PFIZER Comirnaty(Gray Top)Covid-19 Tri-Sucrose Vaccine 09/16/2020   PFIZER(Purple Top)SARS-COV-2 Vaccination 12/30/2019, 01/27/2020   PPD Test 11/07/2021   Pneumococcal Polysaccharide-23 08/20/2018   Health Maintenance  Topic Date Due   Hepatitis C Screening  Never  done   DTaP/Tdap/Td (1 - Tdap) Never done   Zoster Vaccines- Shingrix (1 of 2) Never done   PAP SMEAR-Modifier  02/25/2020   COVID-19 Vaccine (4 - 2023-24 season) 04/12/2022   FOOT EXAM  11/07/2022   Diabetic kidney evaluation - Urine ACR  11/08/2022   OPHTHALMOLOGY EXAM  12/28/2022   HEMOGLOBIN A1C  03/03/2023   INFLUENZA VACCINE  03/13/2023   Diabetic kidney evaluation - eGFR measurement  09/03/2023    MAMMOGRAM  12/19/2023   COLONOSCOPY (Pts 45-10yrs Insurance coverage will need to be confirmed)  12/08/2029   HIV Screening  Completed   HPV VACCINES  Aged Out    LMP: No LMP recorded. Patient is postmenopausal. Sexually Active: yes STD testing offered Pap: 2018 - follows with GYN MGM: 12/2021 DEXA: Due age 26  Colonoscopy: 11/2019 Recall 10 years EGD:  Last Dental Exam: Follows Annually Last Eye Exam: Follows annually Last Derm Exam: Follows annually   Patient Care Team: [name], MD as PCP - General (Internal Medicine)  Surgical History:  She has a past surgical history that includes Laparoscopic ovarian cystectomy (1983); Cesarean section (1993); Functional endoscopic sinus surgery (2003); Tonsillectomy (1973); cystoscopies; Tympanoplasty w/ mastoidectomy (Left, 2016 & 2017); Tubal ligation; Wisdom tooth extraction; and Cystoscopy with retrograde pyelogram, ureteroscopy and stent placement (Bilateral, 07/04/2021). Family History:  Herfamily history includes Alcohol abuse in her father; Breast cancer in her maternal grandmother; Breast cancer (age of onset: 23) in her maternal aunt; Depression in her father and mother; Esophageal cancer in her maternal uncle; Heart disease in her maternal grandmother, paternal grandfather, and paternal grandmother; Kidney disease in her father; Stroke in her paternal grandmother; Suicidality in her father, maternal grandfather, and mother. Social History:  She reports that she quit smoking about 5 years ago. Her smoking use included cigarettes. She has a 15.00 pack-year smoking history. She has never used smokeless tobacco. She reports that she does not drink alcohol and does not use drugs.  Review of Systems: Review of Systems  Constitutional: Negative.        Hair loss  HENT: Negative.    Eyes: Negative.   Respiratory: Negative.    Cardiovascular: Negative.   Gastrointestinal:  Positive for heartburn.  Genitourinary:  Positive for  dysuria.  Musculoskeletal:  Positive for back pain.  Skin:  Negative for rash.  Neurological: Negative.   Endo/Heme/Allergies: Negative.   Psychiatric/Behavioral:  The patient is nervous/anxious.     Physical Exam: Estimated body mass index is 26.85 kg/m as calculated from the following:   Height as of this encounter: 5\' 3"  (1.6 m).   Weight as of this encounter: 151 lb 9.6 oz (68.8 kg). BP 108/66   Pulse 80   Temp 97.6 F (36.4 C)   Ht 5\' 3"  (1.6 m)   Wt 151 lb 9.6 oz (68.8 kg)   SpO2 97%   BMI 26.85 kg/m   General Appearance: Well nourished, well developed, in no apparent distress.  Eyes: PERRLA, EOMs, conjunctiva no swelling or erythema, normal fundi and vessels.  Sinuses: No Frontal/maxillary tenderness  ENT/Mouth: Ext aud canals clear, normal light reflex with TMs without erythema, bulging. Good dentition. No erythema, swelling, or exudate on post pharynx. Tonsils not swollen or erythematous. Hearing normal.  Neck: Supple, thyroid normal. No bruits  Respiratory: Respiratory effort normal, BS equal bilaterally without rales, rhonchi, wheezing or stridor.  Cardio: RRR without murmurs, rubs or gallops. Brisk peripheral pulses without edema.  Chest: symmetric, with normal excursions and percussion.  Breasts: Symmetric, without lumps, nipple discharge, retractions.  Abdomen: Soft, nontender, no guarding, rebound, hernias, masses, or organomegaly.  Lymphatics: Non tender without lymphadenopathy.  Musculoskeletal: Full ROM all peripheral extremities,5/5 strength, and normal gait.  Skin: Warm, dry without rashes, lesions, ecchymosis. Neuro: Cranial nerves intact, reflexes equal bilaterally. Normal muscle tone, no cerebellar symptoms. Sensation intact.  Psych: Awake and oriented X 3, normal affect, Insight and Judgment appropriate.  Genitourinary: Female genitalia: not done  EKG: WNL no ST changes BBB NSR   Noelene Gang, NP 3:30 PM [facility]

## 2022-12-31 ENCOUNTER — Encounter: Payer: Self-pay | Admitting: Nurse Practitioner

## 2022-12-31 LAB — URINALYSIS, ROUTINE W REFLEX MICROSCOPIC
Bilirubin Urine: NEGATIVE
Hgb urine dipstick: NEGATIVE
Leukocytes,Ua: NEGATIVE
Nitrite: NEGATIVE
Protein, ur: NEGATIVE
pH: 5.5 (ref 5.0–8.0)

## 2022-12-31 LAB — CBC WITH DIFFERENTIAL/PLATELET
Basophils Relative: 0.6 %
Eosinophils Absolute: 169 cells/uL (ref 15–500)
HCT: 42.7 % (ref 35.0–45.0)
Monocytes Relative: 8.9 %
Platelets: 316 10*3/uL (ref 140–400)
RBC: 4.65 10*6/uL (ref 3.80–5.10)
WBC: 7.7 10*3/uL (ref 3.8–10.8)

## 2022-12-31 LAB — INSULIN, RANDOM: Insulin: 19.6 u[IU]/mL — ABNORMAL HIGH

## 2022-12-31 LAB — MAGNESIUM: Magnesium: 2 mg/dL (ref 1.5–2.5)

## 2022-12-31 LAB — COMPLETE METABOLIC PANEL WITH GFR
Alkaline phosphatase (APISO): 54 U/L (ref 37–153)
BUN: 13 mg/dL (ref 7–25)
Calcium: 9.8 mg/dL (ref 8.6–10.4)
Chloride: 105 mmol/L (ref 98–110)
Sodium: 141 mmol/L (ref 135–146)
Total Bilirubin: 0.3 mg/dL (ref 0.2–1.2)
eGFR: 105 mL/min/{1.73_m2} (ref >=60)

## 2022-12-31 LAB — HEMOGLOBIN A1C: Hgb A1c MFr Bld: 6.1 % of total Hgb — ABNORMAL HIGH (ref <5.7)

## 2022-12-31 LAB — URINE CULTURE

## 2022-12-31 LAB — LIPID PANEL
HDL: 49 mg/dL — ABNORMAL LOW (ref >=50)
Non-HDL Cholesterol (Calc): 84 mg/dL (calc) (ref <130)

## 2022-12-31 LAB — MICROALBUMIN / CREATININE URINE RATIO
Microalb Creat Ratio: 11 mg/g creat (ref <30)
Microalb, Ur: 0.5 mg/dL

## 2022-12-31 LAB — VITAMIN D 25 HYDROXY (VIT D DEFICIENCY, FRACTURES): Vit D, 25-Hydroxy: 43 ng/mL (ref 30–100)

## 2023-01-01 ENCOUNTER — Other Ambulatory Visit: Payer: Self-pay | Admitting: Nurse Practitioner

## 2023-01-01 MED ORDER — CIPROFLOXACIN HCL 250 MG PO TABS: ORAL_TABLET | ORAL | 0 refills | Status: DC

## 2023-01-02 LAB — LIPID PANEL
Cholesterol: 133 mg/dL (ref <200)
LDL Cholesterol (Calc): 54 mg/dL (calc)
Total CHOL/HDL Ratio: 2.7 (calc) (ref <5.0)
Triglycerides: 244 mg/dL — ABNORMAL HIGH (ref <150)

## 2023-01-02 LAB — TSH: TSH: 3.9 mIU/L

## 2023-01-02 LAB — MICROALBUMIN / CREATININE URINE RATIO: Creatinine, Urine: 45 mg/dL (ref 20–275)

## 2023-01-02 LAB — COMPLETE METABOLIC PANEL WITH GFR
AG Ratio: 1.8 (calc) (ref 1.0–2.5)
ALT: 12 U/L (ref 6–29)
AST: 15 U/L (ref 10–35)
Albumin: 4.4 g/dL (ref 3.6–5.1)
CO2: 26 mmol/L (ref 20–32)
Creat: 0.63 mg/dL (ref 0.50–1.03)
Globulin: 2.4 g/dL (calc) (ref 1.9–3.7)
Glucose, Bld: 71 mg/dL (ref 65–99)
Potassium: 4.2 mmol/L (ref 3.5–5.3)
Total Protein: 6.8 g/dL (ref 6.1–8.1)

## 2023-01-02 LAB — HEMOGLOBIN A1C
Mean Plasma Glucose: 128 mg/dL
eAG (mmol/L): 7.1 mmol/L

## 2023-01-02 LAB — URINALYSIS, ROUTINE W REFLEX MICROSCOPIC
Ketones, ur: NEGATIVE
Specific Gravity, Urine: 1.035 (ref 1.001–1.035)

## 2023-01-02 LAB — VITAMIN B12: Vitamin B-12: 779 pg/mL (ref 200–1100)

## 2023-01-02 LAB — CBC WITH DIFFERENTIAL/PLATELET
Absolute Monocytes: 685 cells/uL (ref 200–950)
Eosinophils Relative: 2.2 %
Hemoglobin: 14.5 g/dL (ref 11.7–15.5)
MCH: 31.2 pg (ref 27.0–33.0)
MCHC: 34 g/dL (ref 32.0–36.0)
Neutro Abs: 3919 cells/uL (ref 1500–7800)
Neutrophils Relative %: 50.9 %

## 2023-01-02 LAB — URINE CULTURE
MICRO NUMBER:: 14980400
SPECIMEN QUALITY:: ADEQUATE

## 2023-01-03 ENCOUNTER — Encounter: Payer: Self-pay | Admitting: Internal Medicine

## 2023-01-03 ENCOUNTER — Other Ambulatory Visit: Payer: Self-pay | Admitting: Internal Medicine

## 2023-01-03 DIAGNOSIS — N3000 Acute cystitis without hematuria: Secondary | ICD-10-CM

## 2023-01-03 MED ORDER — CEFUROXIME AXETIL 250 MG PO TABS
ORAL_TABLET | ORAL | 0 refills | Status: AC
Start: 2023-01-03 — End: ?

## 2023-02-03 ENCOUNTER — Other Ambulatory Visit: Payer: Self-pay | Admitting: Internal Medicine

## 2023-02-03 ENCOUNTER — Other Ambulatory Visit: Payer: Self-pay | Admitting: Nurse Practitioner

## 2023-02-03 DIAGNOSIS — E1140 Type 2 diabetes mellitus with diabetic neuropathy, unspecified: Secondary | ICD-10-CM

## 2023-02-03 DIAGNOSIS — E6609 Other obesity due to excess calories: Secondary | ICD-10-CM

## 2023-02-05 ENCOUNTER — Other Ambulatory Visit: Payer: Self-pay | Admitting: Internal Medicine

## 2023-02-05 DIAGNOSIS — R058 Other specified cough: Secondary | ICD-10-CM

## 2023-02-11 ENCOUNTER — Other Ambulatory Visit: Payer: Self-pay | Admitting: Nurse Practitioner

## 2023-02-11 DIAGNOSIS — N951 Menopausal and female climacteric states: Secondary | ICD-10-CM

## 2023-02-11 DIAGNOSIS — Z780 Asymptomatic menopausal state: Secondary | ICD-10-CM

## 2023-03-24 ENCOUNTER — Other Ambulatory Visit: Payer: Self-pay | Admitting: Nurse Practitioner

## 2023-03-24 DIAGNOSIS — E1140 Type 2 diabetes mellitus with diabetic neuropathy, unspecified: Secondary | ICD-10-CM

## 2023-03-24 DIAGNOSIS — E6609 Other obesity due to excess calories: Secondary | ICD-10-CM

## 2023-03-25 ENCOUNTER — Other Ambulatory Visit: Payer: Self-pay | Admitting: Nurse Practitioner

## 2023-03-27 ENCOUNTER — Other Ambulatory Visit: Payer: Self-pay | Admitting: Internal Medicine

## 2023-03-27 ENCOUNTER — Other Ambulatory Visit: Payer: Self-pay | Admitting: Nurse Practitioner

## 2023-03-27 DIAGNOSIS — E6609 Other obesity due to excess calories: Secondary | ICD-10-CM

## 2023-03-27 DIAGNOSIS — E1140 Type 2 diabetes mellitus with diabetic neuropathy, unspecified: Secondary | ICD-10-CM

## 2023-03-27 MED ORDER — PHENTERMINE HCL 37.5 MG PO TABS
ORAL_TABLET | ORAL | 0 refills | Status: DC
Start: 2023-03-27 — End: 2023-05-30

## 2023-03-27 MED ORDER — PREGABALIN 200 MG PO CAPS
ORAL_CAPSULE | ORAL | 0 refills | Status: DC
Start: 2023-03-27 — End: 2023-06-25

## 2023-04-08 ENCOUNTER — Ambulatory Visit: Payer: Managed Care, Other (non HMO) | Admitting: Nurse Practitioner

## 2023-05-30 ENCOUNTER — Other Ambulatory Visit: Payer: Self-pay | Admitting: Cardiology

## 2023-05-30 ENCOUNTER — Other Ambulatory Visit: Payer: Self-pay | Admitting: Nurse Practitioner

## 2023-05-30 DIAGNOSIS — E66811 Obesity, class 1: Secondary | ICD-10-CM

## 2023-06-02 MED ORDER — PHENTERMINE HCL 37.5 MG PO TABS
ORAL_TABLET | ORAL | 0 refills | Status: DC
Start: 2023-06-02 — End: 2023-06-25

## 2023-06-02 NOTE — Telephone Encounter (Signed)
Spoke with pt, aware we can not refill her medications as she has not been seen since 2019. Patient voiced understanding and hung up.

## 2023-06-16 ENCOUNTER — Ambulatory Visit: Payer: Managed Care, Other (non HMO) | Admitting: Nurse Practitioner

## 2023-06-19 ENCOUNTER — Emergency Department (HOSPITAL_BASED_OUTPATIENT_CLINIC_OR_DEPARTMENT_OTHER)
Admission: EM | Admit: 2023-06-19 | Discharge: 2023-06-19 | Disposition: A | Discharge status: Home / Self Care | Payer: Managed Care, Other (non HMO) | Attending: Emergency Medicine | Admitting: Emergency Medicine

## 2023-06-19 ENCOUNTER — Encounter: Payer: Self-pay | Admitting: Nurse Practitioner

## 2023-06-19 ENCOUNTER — Ambulatory Visit (INDEPENDENT_AMBULATORY_CARE_PROVIDER_SITE_OTHER): Payer: Managed Care, Other (non HMO) | Admitting: Nurse Practitioner

## 2023-06-19 ENCOUNTER — Other Ambulatory Visit (HOSPITAL_BASED_OUTPATIENT_CLINIC_OR_DEPARTMENT_OTHER): Payer: Self-pay

## 2023-06-19 ENCOUNTER — Encounter (HOSPITAL_BASED_OUTPATIENT_CLINIC_OR_DEPARTMENT_OTHER): Payer: Self-pay | Admitting: Emergency Medicine

## 2023-06-19 ENCOUNTER — Emergency Department (HOSPITAL_BASED_OUTPATIENT_CLINIC_OR_DEPARTMENT_OTHER): Payer: Managed Care, Other (non HMO)

## 2023-06-19 ENCOUNTER — Other Ambulatory Visit: Payer: Self-pay

## 2023-06-19 VITALS — BP 102/80 | HR 96 | Temp 98.0°F | Ht 63.0 in | Wt 153.4 lb

## 2023-06-19 DIAGNOSIS — L539 Erythematous condition, unspecified: Secondary | ICD-10-CM

## 2023-06-19 DIAGNOSIS — M79661 Pain in right lower leg: Secondary | ICD-10-CM | POA: Diagnosis present

## 2023-06-19 DIAGNOSIS — R600 Localized edema: Secondary | ICD-10-CM | POA: Diagnosis not present

## 2023-06-19 DIAGNOSIS — M79604 Pain in right leg: Secondary | ICD-10-CM | POA: Diagnosis not present

## 2023-06-19 MED ORDER — OXYCODONE-ACETAMINOPHEN 5-325 MG PO TABS
2.0000 | ORAL_TABLET | Freq: Once | ORAL | Status: AC
  Administered 2023-06-19: 2 via ORAL
  Filled 2023-06-19: qty 2

## 2023-06-19 MED ORDER — METHOCARBAMOL 750 MG PO TABS: 750.0000 mg | ORAL_TABLET | Freq: Three times a day (TID) | ORAL | 1 refills | Status: AC | PRN

## 2023-06-19 MED ORDER — LIDOCAINE 5 % EX PTCH: 1.0000 | MEDICATED_PATCH | CUTANEOUS | 0 refills | Status: AC

## 2023-06-19 MED ORDER — LIDOCAINE 5 % EX PTCH
1.0000 | MEDICATED_PATCH | CUTANEOUS | Status: DC
  Administered 2023-06-19: 1 via TRANSDERMAL
  Filled 2023-06-19: qty 1

## 2023-06-19 MED ORDER — OXYCODONE-ACETAMINOPHEN 5-325 MG PO TABS
2.0000 | ORAL_TABLET | Freq: Four times a day (QID) | ORAL | 0 refills | Status: DC | PRN
  Filled 2023-06-19: qty 15, 2d supply, fill #0

## 2023-06-19 MED ORDER — OXYCODONE-ACETAMINOPHEN 5-325 MG PO TABS: 2.0000 | ORAL_TABLET | Freq: Four times a day (QID) | ORAL | 0 refills | Status: AC | PRN

## 2023-06-19 MED ORDER — LIDOCAINE 5 % EX PTCH
1.0000 | MEDICATED_PATCH | CUTANEOUS | 0 refills | Status: DC
  Filled 2023-06-19: qty 30, 30d supply, fill #0

## 2023-06-19 NOTE — Patient Instructions (Signed)
YOU CAN CALL TO MAKE AN ULTRASOUND..  I have put in an order for an ultrasound for you to have You can set them up at your convenience by calling this number [phone]  If you have any issues call our office and we will set this up for you.    You have been provided a sample of Eliquis 5 mg.  Take 10 mg twice a day for 7 days or until a DVT is ruled out.  Deep Vein Thrombosis  Deep vein thrombosis (DVT) is a condition in which a blood clot forms in a vein of the deep venous system. This can occur in the lower leg, thigh, pelvis, arm, or neck. A clot is blood that has thickened into a gel or solid. This condition is serious and can be life-threatening if the clot travels to the arteries of the lungs and causes a blockage (pulmonary embolism). A DVT can also damage veins in the leg, which can lead to long-term venous disease, leg pain, swelling, discoloration, and ulcers or sores (post-thrombotic syndrome). What are the causes? This condition may be caused by: A slowdown of blood flow. Damage to a vein. A condition that causes blood to clot more easily, such as certain bleeding disorders. What increases the risk? The following factors may make you more likely to develop this condition: Obesity. Being older, especially older than age 18. Being inactive or not moving around (sedentary lifestyle). This may include: Sitting or lying down for longer than 4-6 hours other than to sleep at night. Being in the hospital, or having major or lengthy surgery. Having any recent bone injuries, such as breaks (fractures), that reduce movement, especially in the lower extremities. Having recent orthopedic surgery on the lower extremities. Being pregnant, giving birth, or having recently given birth. Taking medicines that contain estrogen, such as birth control or hormone replacement therapy. Using products that contain nicotine or tobacco, especially if you use hormonal birth control. Having a history  of a blood vessel disease (peripheral vascular disease) or congestive heart disease. Having a history of cancer, especially if being treated with chemotherapy. What are the signs or symptoms? Symptoms of this condition include: Swelling, pain, pressure, or tenderness in an arm or a leg. An arm or a leg becoming warm, red, or discolored. A leg turning very pale or blue. You may have a large DVT. This is rare. If the clot is in your leg, you may notice that symptoms get worse when you stand or walk. In some cases, there are no symptoms. How is this diagnosed? This condition is diagnosed with: Your medical history and a physical exam. Tests, such as: Blood tests to check how well your blood clots. Doppler ultrasound. This is the best way to find a DVT. CT venogram. Contrast dye is injected into a vein, and X-rays are taken to check for clots. This is helpful for veins in the chest or pelvis. How is this treated? Treatment for this condition depends on: The cause of your DVT. The size and location of your DVT, or having more than one DVT. Your risk for bleeding or developing more clots. Other medical conditions you may have. Treatment may include: Taking a blood thinner medicine (anticoagulant) to prevent more clots from forming or current clots from growing. Wearing compression stockings. Injecting medicines into the affected vein to break up the clot (catheter-directed thrombolysis). Surgical procedures, when DVT is severe or hard to treat. These may be done to: Isolate and remove your clot.  Place an inferior vena cava (IVC) filter. This filter is placed into a large vein called the inferior vena cava to catch blood clots before they reach your lungs. You may get some medical treatments for 6 months or longer. Follow these instructions at home: If you are taking blood thinners: Talk with your health care provider before you take any medicines that contain aspirin or NSAIDs, such as  ibuprofen. These medicines increase your risk for dangerous bleeding. Take your medicine exactly as told, at the same time every day. Do not skip a dose. Do not take more than the prescribed dose. This is important. Ask your health care provider about foods and medicines that could change or interact with the way your blood thinner works. Avoid these foods and medicines if you are told to do so. Avoid anything that may cause bleeding or bruising. You may bleed more easily while taking blood thinners. Be very careful when using knives, scissors, or other sharp objects. Use an electric razor instead of a blade. Avoid activities that could cause injury or bruising, and follow instructions for preventing falls. Tell your health care provider if you have had any internal bleeding, bleeding ulcers, or neurologic diseases, such as strokes or cerebral aneurysms. Wear a medical alert bracelet or carry a card that lists what medicines you take. General instructions Take over-the-counter and prescription medicines only as told by your health care provider. Return to your normal activities as told by your health care provider. Ask your health care provider what activities are safe for you. If recommended, wear compression stockings as told by your health care provider. These stockings help to prevent blood clots and reduce swelling in your legs. Never wear your compression stockings while sleeping at night. Keep all follow-up visits. This is important. Where to find more information American Heart Association: www.heart.org Centers for Disease Control and Prevention: FootballExhibition.com.br National Heart, Lung, and Blood Institute: PopSteam.is Contact a health care provider if: You miss a dose of your blood thinner. You have unusual bruising or other color changes. You have new or worse pain, swelling, or redness in an arm or a leg. You have worsening numbness or tingling in an arm or a leg. You have a  significant color change (pale or blue) in the extremity that has the DVT. Get help right away if: You have signs or symptoms that a blood clot has moved to the lungs. These may include: Shortness of breath. Chest pain. Fast or irregular heartbeats (palpitations). Light-headedness, dizziness, or fainting. Coughing up blood. You have signs or symptoms that your blood is too thin. These may include: Blood in your vomit, stool, or urine. A cut that will not stop bleeding. A menstrual period that is heavier than usual. A severe headache or confusion. These symptoms may be an emergency. Get help right away. Call 911. Do not wait to see if the symptoms will go away. Do not drive yourself to the hospital. Summary Deep vein thrombosis (DVT) happens when a blood clot forms in a deep vein. This may occur in the lower leg, thigh, pelvis, arm, or neck. Symptoms affect the arm or leg and can include swelling, pain, tenderness, warmth, redness, or discoloration. This condition may be treated with medicines. In severe cases, a procedure or surgery may be done to remove or dissolve the clots. If you are taking blood thinners, take them exactly as told. Do not skip a dose. Do not take more than is prescribed. Get help right away if  you have a severe headache, shortness of breath, chest pain, fast or irregular heartbeats, or blood in your vomit, urine, or stool. This information is not intended to replace advice given to you by your health care provider. Make sure you discuss any questions you have with your health care provider. Document Revised: 02/19/2021 Document Reviewed: 02/19/2021 Elsevier Patient Education  2024 [employer].

## 2023-06-19 NOTE — Discharge Instructions (Addendum)
As we discussed, your ultrasound was negative for DVT.  We will treat this as a muscular strain of the right calf.  I have sent narcotic medication to your pharmacy and give you lidocaine patches.  Please follow-up with your primary care doctor for further evaluation.  You may return to the emergency department for further evaluation.

## 2023-06-19 NOTE — ED Provider Notes (Signed)
[place] EMERGENCY DEPARTMENT AT [facility] Provider Note   CSN: [id number] Arrival date & time: 06/19/23  1126     History Chief Complaint  Patient presents with   Leg Swelling    Caitlin Barnett is a 57 y.o. female patient who presents to the emerged apartment today for further evaluation of right leg pain.  Patient states that 6 days ago she was walking on a cruise when she felt a sharp pain in the back of her right calf and she was unable to walk since then.  She states that she has progressed from a wheelchair to a walker but still has a lot of pain when she places a lot of weight on the plantar aspect of the right foot.  Patient was sent by her PCP for further evaluation of a DVT in the right leg. She denies any other injury.   HPI     Home Medications Prior to Admission medications   Medication Sig Start Date End Date Taking? Authorizing Provider  albuterol (PROVENTIL) (2.5 MG/3ML) 0.083% nebulizer solution Take 3 mLs (2.5 mg total) by nebulization every 6 (six) hours as needed for wheezing or shortness of breath. 10/08/22   Adela Glimpse, NP  albuterol (VENTOLIN HFA) 108 (90 Base) MCG/ACT inhaler 2 inhalations  15-20  minutes apart every 4 hours as needed to rescue Asthma 10/08/22   Adela Glimpse, NP  ALPRAZolam (XANAX) 0.5 MG tablet Take 1/2 - 1 tablet 2 - 3 x /day ONLY if needed for Anxiety Attack &  limit to 5 days /week to avoid Addiction & Dementia                                            /                                                       TAKE                           BY                           MOUTH 04/09/22   [name], MD  benzonatate (TESSALON) 200 MG capsule Take 1 perle  (200 mg) 3 x/day to Prevent Cough 10/08/22   [name], MD  blood glucose meter kit and supplies KIT Dispense based on patient and insurance preference. Use up to four times daily as directed. (FOR ICD-9 250.00, 250.01). 12/05/17   Hollice Espy, MD   butalbital-acetaminophen-caffeine [facility]) [phone] MG tablet Take 0.5 tablets by mouth every 4 (four) hours as needed for headache. 08/09/19   Judd Gaudier, NP  cefUROXime (CEFTIN) 250 MG tablet Take  1 tablet  2 x/day  with Food for UTI 01/03/23   [name], MD  cetirizine (ZYRTEC) 10 MG tablet Take 10 mg by mouth daily.    [provider]  Cholecalciferol 1000 units capsule Take 5,000 Units by mouth 2 (two) times daily.     [provider]  cyanocobalamin (,VITAMIN B-12,) 1000 MCG/ML injection Inject 1,000 mcg into the muscle every 30 (thirty) days. 02/17/21   [provider]  escitalopram (LEXAPRO) 20 MG tablet TAKE 1 TABLET BY MOUTH DAILY FOR MOOD 08/15/22   Raynelle Dick, NP  esomeprazole (NEXIUM) 40 MG capsule TAKE 1 CAPSULE DAILY TO PREVENT HEARTBURN & INDIGESTION 11/17/22   Adela Glimpse, NP  furosemide (LASIX) 40 MG tablet Take 0.5-1 tablets (20-40 mg total) by mouth daily as needed. NEED OV. Patient taking differently: Take 20 mg by mouth daily. 02/10/20   Lewayne Bunting, MD  glucose blood test strip Test blood sugar once daily 08/09/19   Judd Gaudier, NP  JARDIANCE 25 MG TABS tablet TAKE 1 TABLET BY MOUTH DAILY FOR DIABETES 10/10/22   Raynelle Dick, NP  lidocaine (LIDODERM) 5 % Place 1 patch onto the skin daily. Remove & Discard patch within 12 hours or as directed by MD 06/19/23   Teressa Lower, PA-C  metFORMIN (GLUCOPHAGE-XR) 500 MG 24 hr tablet Take 2 tablets 2 x/ day with meals  for Diabetes 08/02/22   [name], MD  methocarbamol (ROBAXIN-750) 750 MG tablet Take 1 tablet (750 mg total) by mouth every 8 (eight) hours as needed for muscle spasms. 06/19/23   Adela Glimpse, NP  ondansetron (ZOFRAN) 8 MG tablet TAKE 1/2 TO 1 TAB EVERY 6 TO 8 HOURS AS NEEDED FOR NAUSEA OR VOMITING 03/25/23   Raynelle Dick, NP  oxyCODONE-acetaminophen (PERCOCET/ROXICET) 5-325 MG tablet Take 2 tablets by mouth every 6 (six) hours as needed for  severe pain (pain score 7-10). 06/19/23   Teressa Lower, PA-C  phenazopyridine (PYRIDIUM) 200 MG tablet Take 1 tab up to three times a day as needed for bladder pain. Patient not taking: Reported on 06/19/2023 04/26/21   Judd Gaudier, NP  phentermine (ADIPEX-P) 37.5 MG tablet Take 1 tablet every morning for Dieting & Weight  Loss 06/02/23   Adela Glimpse, NP  potassium chloride (KLOR-CON) 10 MEQ tablet TAKE 1 TABLET BY MOUTH EVERY DAY Patient taking differently: Take 10 mEq by mouth daily as needed (low potassium). 12/23/19   Judd Gaudier, NP  pregabalin (LYRICA) 200 MG capsule TAKE 1 CAPSULE 3 TIMES A DAY FOR PAINFUL NEUROPATHY 03/27/23   [name], MD  Semaglutide, 2 MG/DOSE, (OZEMPIC, 2 MG/DOSE,) 8 MG/3ML SOPN INJECT 2 MG INTO THE SKIN ONCE A WEEK. 02/03/23   Cranford, Archie Patten, NP  traMADol (ULTRAM) 50 MG tablet TAKE 1/2 TO 1 TABLET EVERY 4 HOURS AS NEEDED FOR SEVERE COUGH Patient not taking: Reported on 06/19/2023 02/06/23   [name], MD  valACYclovir (VALTREX) 1000 MG tablet Take 1 tablet 3 x /day for Fever Blisters / Cold Sores Patient taking differently: Take 1,000 mg by mouth daily as needed (fever blisters). 10/17/19   [name], MD      Allergies    Tetanus toxoids and Sulfa antibiotics    Review of Systems   Review of Systems  All other systems reviewed and are negative.   Physical Exam Updated Vital Signs BP 122/70   Pulse 93   Temp 98.1 F (36.7 C) (Oral)   Resp 20   Ht 5\' 2"  (1.575 m)   Wt 69.4 kg   SpO2 96%   BMI 27.98 kg/m  Physical Exam Vitals and nursing note reviewed.  Constitutional:      Appearance: Normal appearance.  HENT:     Head: Normocephalic and atraumatic.  Eyes:     General:        Right eye: No discharge.        Left eye: No discharge.  Conjunctiva/sclera: Conjunctivae normal.  Pulmonary:     Effort: Pulmonary effort is normal.  Musculoskeletal:     Comments: Negative Thompson sign.  Increased level of tenderness  with dorsiflexion of the right hand. 2+ DP pulse felt bilaterally. Good sensation distally.   Skin:    General: Skin is warm and dry.     Findings: No rash.  Neurological:     General: No focal deficit present.     Mental Status: She is alert.  Psychiatric:        Mood and Affect: Mood normal.        Behavior: Behavior normal.     ED Results / Procedures / Treatments   Labs (all labs ordered are listed, but only abnormal results are displayed) Labs Reviewed - No data to display  EKG None  Radiology US Venous Img Lower Unilateral Right  Result Date: 06/19/2023 CLINICAL DATA:  Swelling right lower extremity for a week EXAM: Right LOWER EXTREMITY VENOUS DOPPLER ULTRASOUND TECHNIQUE: Gray-scale sonography with compression, as well as color and duplex ultrasound, were performed to evaluate the deep venous system(s) from the level of the common femoral vein through the popliteal and proximal calf veins. COMPARISON:  None Available. FINDINGS: VENOUS Normal compressibility of the common femoral, superficial femoral, and popliteal veins, as well as the visualized calf veins. Visualized portions of profunda femoral vein and great saphenous vein unremarkable. No filling defects to suggest DVT on grayscale or color Doppler imaging. Doppler waveforms show normal direction of venous flow, normal respiratory plasticity and response to augmentation. Limited views of the contralateral common femoral vein are unremarkable. OTHER None. Limitations: none IMPRESSION: No evidence of right lower extremity DVT Electronically Signed   By: Karen Kays M.D.   On: 06/19/2023 13:50    Procedures Procedures    Medications Ordered in ED Medications  lidocaine (LIDODERM) 5 % 1 patch (1 patch Transdermal Patch Applied 06/19/23 1442)  oxyCODONE-acetaminophen (PERCOCET/ROXICET) 5-325 MG per tablet 2 tablet (2 tablets Oral Given 06/19/23 1441)    ED Course/ Medical Decision Making/ A&P   {   Click here for ABCD2,  HEART and other calculators  Medical Decision Making JANIT CUTTER is a 57 y.o. female patient who presents to the emergency department today for further evaluation of right calf pain.  Ultrasound was performed in triage interpreted by myself.  Do not see any evidence of DVT today.  I favor calf strain versus Tear.  There is not a significant amount of ecchymosis but there is moderate amount of swelling.  Will treat with narcotic pain medication, heat, and lidocaine patches.  I will have her follow-up with her primary care doctor for further evaluation.  She is safe for discharge.   Risk Prescription drug management.    Final Clinical Impression(s) / ED Diagnoses Final diagnoses:  Right calf pain    Rx / DC Orders ED Discharge Orders          Ordered    lidocaine (LIDODERM) 5 %  Every 24 hours,   Status:  Discontinued        06/19/23 1446    oxyCODONE-acetaminophen (PERCOCET/ROXICET) 5-325 MG tablet  Every 6 hours PRN,   Status:  Discontinued        06/19/23 1446    lidocaine (LIDODERM) 5 %  Every 24 hours        06/19/23 1453    oxyCODONE-acetaminophen (PERCOCET/ROXICET) 5-325 MG tablet  Every 6 hours PRN  06/19/23 1453              Teressa Lower, PA-C 06/19/23 1459    Laurence Spates, MD 06/19/23 2019

## 2023-06-19 NOTE — Progress Notes (Signed)
Assessment and Plan:  [place] was seen today for an episodic visit.  Diagnoses and all order for this visit:  Acute pain of right lower extremity Considering hx of blood clot with Covid, recent discontinuation of HRT and recent flight will send for STAT LE DVT. Eliquis 5 mg sample provided. Start 10 mg BID for 7 days. Start Robaxin as directed for pain - may continue to take Tylenol 1,000 mg up to QID.    - VAS [place] LOWER EXTREMITY VENOUS (DVT); Future  Edema of right lower extremity  - VAS [place] LOWER EXTREMITY VENOUS (DVT); Future  Localized erythema  - VAS [place] LOWER EXTREMITY VENOUS (DVT); Future  Report to ER for any increase in SOB, difficulty breathing, hemoptysis, unrelenting pain.    Notify office for further evaluation and treatment, questions or concerns if s/s fail to improve. The risks and benefits of my recommendations, as well as other treatment options were discussed with the patient today. Questions were answered.  Further disposition pending results of labs. Discussed med's effects and SE's.    Over 20 minutes of exam, counseling, chart review, and critical decision making was performed.   Future Appointments  Date Time Provider Department Center  06/20/2023  8:00 AM [facility]  12/29/2023  2:00 PM [name], MD [facility] None    ------------------------------------------------------------------------------------------------------------------   HPI BP 102/80   Pulse 96   Temp 98 F (36.7 C)   Ht 5\' 3"  (1.6 m)   Wt 153 lb 6.4 oz (69.6 kg)   SpO2 98%   BMI 27.17 kg/m   57 y.o.female presents for evaluation of RLE pain accompanied by mild erythema and edema.  Onset was 06/05/23.  She flew to [facility] [place] [facility] for a cruise on 06/07/23 and noticed severe pain when walking on 06/10/23 to where she needed a wheelchair to walk.  She then flew back home from [place] and continued to have pain in the RLE most notably at night with increase in calf  edema and mild erythema.  She continues to have difficulty walking.  She has started a baby ASA.    Of note she has a hx of blood clots with covid and recently discontinued HRT oral birth control about 6-8 weeks ago.  Past Medical History:  Diagnosis Date   CHF (congestive heart failure) (HCC)    COVID-19    Diabetes mellitus without complication (HCC)    Diverticulitis    GERD (gastroesophageal reflux disease)    History of kidney stones    Hyperlipidemia    Hypertension    Hypothyroidism    IBS (irritable bowel syndrome)    Insomnia 12/10/2019   Kidney stone    Migraine    MRSA (methicillin resistant staph aureus) culture positive    Pneumonia 11/2017   PONV (postoperative nausea and vomiting)    Thyroid disease    UTI (urinary tract infection)      Allergies  Allergen Reactions   Tetanus Toxoids Other (See Comments)    Injection site abcess   Sulfa Antibiotics Hives    itching    Current Outpatient Medications on File Prior to Visit  Medication Sig   albuterol (PROVENTIL) (2.5 MG/3ML) 0.083% nebulizer solution Take 3 mLs (2.5 mg total) by nebulization every 6 (six) hours as needed for wheezing or shortness of breath.   albuterol (VENTOLIN HFA) 108 (90 Base) MCG/ACT inhaler 2 inhalations  15-20  minutes apart every 4 hours as needed to rescue Asthma  ALPRAZolam (XANAX) 0.5 MG tablet Take 1/2 - 1 tablet 2 - 3 x /day ONLY if needed for Anxiety Attack &  limit to 5 days /week to avoid Addiction & Dementia                                            /                                                       TAKE                           BY                           MOUTH   benzonatate (TESSALON) 200 MG capsule Take 1 perle  (200 mg) 3 x/day to Prevent Cough   blood glucose meter kit and supplies KIT Dispense based on patient and insurance preference. Use up to four times daily as directed. (FOR ICD-9 250.00, 250.01).   butalbital-acetaminophen-caffeine (FIORICET) 50-325-40 MG  tablet Take 0.5 tablets by mouth every 4 (four) hours as needed for headache.   cefUROXime (CEFTIN) 250 MG tablet Take  1 tablet  2 x/day  with Food for UTI   cetirizine (ZYRTEC) 10 MG tablet Take 10 mg by mouth daily.   Cholecalciferol 1000 units capsule Take 5,000 Units by mouth 2 (two) times daily.    ciprofloxacin (CIPRO) 250 MG tablet Take 1 tablet 2 x /day with Food for Infection   cyanocobalamin (,VITAMIN B-12,) 1000 MCG/ML injection Inject 1,000 mcg into the muscle every 30 (thirty) days.   escitalopram (LEXAPRO) 20 MG tablet TAKE 1 TABLET BY MOUTH DAILY FOR MOOD   esomeprazole (NEXIUM) 40 MG capsule TAKE 1 CAPSULE DAILY TO PREVENT HEARTBURN & INDIGESTION   estrogen, conjugated,-medroxyprogesterone (PREMPRO) 0.45-1.5 MG tablet Take 1 tablet  Daily   furosemide (LASIX) 40 MG tablet Take 0.5-1 tablets (20-40 mg total) by mouth daily as needed. NEED OV. (Patient taking differently: Take 20 mg by mouth daily.)   glucose blood test strip Test blood sugar once daily   JARDIANCE 25 MG TABS tablet TAKE 1 TABLET BY MOUTH DAILY FOR DIABETES   metFORMIN (GLUCOPHAGE-XR) 500 MG 24 hr tablet Take 2 tablets 2 x/ day with meals  for Diabetes   ondansetron (ZOFRAN) 8 MG tablet TAKE 1/2 TO 1 TAB EVERY 6 TO 8 HOURS AS NEEDED FOR NAUSEA OR VOMITING   phentermine (ADIPEX-P) 37.5 MG tablet Take 1 tablet every morning for Dieting & Weight  Loss   potassium chloride (KLOR-CON) 10 MEQ tablet TAKE 1 TABLET BY MOUTH EVERY DAY (Patient taking differently: Take 10 mEq by mouth daily as needed (low potassium).)   pregabalin (LYRICA) 200 MG capsule TAKE 1 CAPSULE 3 TIMES A DAY FOR PAINFUL NEUROPATHY   Semaglutide, 2 MG/DOSE, (OZEMPIC, 2 MG/DOSE,) 8 MG/3ML SOPN INJECT 2 MG INTO THE SKIN ONCE A WEEK.   valACYclovir (VALTREX) 1000 MG tablet Take 1 tablet 3 x /day for Fever Blisters / Cold Sores (Patient taking differently: Take 1,000 mg by mouth daily as needed (fever blisters).)   phenazopyridine (PYRIDIUM) 200 MG tablet  Take 1 tab up to three times a day as needed for bladder pain. (Patient not taking: Reported on 06/19/2023)   traMADol (ULTRAM) 50 MG tablet TAKE 1/2 TO 1 TABLET EVERY 4 HOURS AS NEEDED FOR SEVERE COUGH (Patient not taking: Reported on 06/19/2023)   No current facility-administered medications on file prior to visit.    ROS: all negative except what is noted in the HPI.   Physical Exam:  BP 102/80   Pulse 96   Temp 98 F (36.7 C)   Ht 5\' 3"  (1.6 m)   Wt 153 lb 6.4 oz (69.6 kg)   SpO2 98%   BMI 27.17 kg/m   General Appearance: NAD.  Awake, conversant and cooperative. Eyes: PERRLA, EOMs intact.  Sclera white.  Conjunctiva without erythema. Sinuses: No frontal/maxillary tenderness.  No nasal discharge. Nares patent.  ENT/Mouth: Ext aud canals clear.  Bilateral TMs w/DOL and without erythema or bulging. Hearing intact.  Posterior pharynx without swelling or exudate.  Tonsils without swelling or erythema.  Neck: Supple.  No masses, nodules or thyromegaly. Respiratory: Effort is regular with non-labored breathing. Breath sounds are equal bilaterally without rales, rhonchi, wheezing or stridor.  Cardio: RRR with no MRGs. Brisk peripheral pulses without edema.  Abdomen: Active BS in all four quadrants.  Soft and non-tender without guarding, rebound tenderness, hernias or masses. Lymphatics: Non tender without lymphadenopathy.  Musculoskeletal: RLE with mild edema and erythema.   Pain in calf with walking.  Full ROM, 5/5 strength, normal ambulation.  No clubbing or cyanosis. Skin: Appropriate color for ethnicity. Warm without rashes, lesions, ecchymosis, ulcers.  Neuro: CN II-XII grossly normal. Normal muscle tone without cerebellar symptoms and intact sensation.   Psych: AO X 3,  appropriate mood and affect, insight and judgment.     Adela Glimpse, NP 11:04 AM [facility]

## 2023-06-19 NOTE — ED Triage Notes (Signed)
Pt arrived POV from home. Seen at PCP and was sent for possible blood clot in R leg. Pt states it has been going on for about a week, but was on a cruise. Cool to touch and swelling noted. Painful to ambulate.

## 2023-06-20 ENCOUNTER — Encounter (HOSPITAL_COMMUNITY): Payer: Managed Care, Other (non HMO)

## 2023-06-21 ENCOUNTER — Other Ambulatory Visit: Payer: Self-pay | Admitting: Internal Medicine

## 2023-06-21 DIAGNOSIS — E1140 Type 2 diabetes mellitus with diabetic neuropathy, unspecified: Secondary | ICD-10-CM

## 2023-06-25 ENCOUNTER — Other Ambulatory Visit: Payer: Self-pay | Admitting: Internal Medicine

## 2023-06-25 ENCOUNTER — Encounter: Payer: Self-pay | Admitting: Nurse Practitioner

## 2023-06-25 ENCOUNTER — Other Ambulatory Visit: Payer: Self-pay | Admitting: Nurse Practitioner

## 2023-06-25 DIAGNOSIS — E1140 Type 2 diabetes mellitus with diabetic neuropathy, unspecified: Secondary | ICD-10-CM

## 2023-06-25 DIAGNOSIS — E66811 Obesity, class 1: Secondary | ICD-10-CM

## 2023-06-25 DIAGNOSIS — Z780 Asymptomatic menopausal state: Secondary | ICD-10-CM

## 2023-06-25 DIAGNOSIS — N951 Menopausal and female climacteric states: Secondary | ICD-10-CM

## 2023-06-25 MED ORDER — PREGABALIN 200 MG PO CAPS
ORAL_CAPSULE | ORAL | 0 refills | Status: DC
Start: 2023-06-25 — End: 2023-09-22

## 2023-06-25 MED ORDER — PHENTERMINE HCL 37.5 MG PO TABS: ORAL_TABLET | ORAL | 0 refills | Status: DC

## 2023-06-26 MED ORDER — PREMPRO 0.45-1.5 MG PO TABS: ORAL_TABLET | ORAL | 0 refills | Status: AC

## 2023-07-03 ENCOUNTER — Other Ambulatory Visit: Payer: Self-pay | Admitting: Nurse Practitioner

## 2023-07-13 ENCOUNTER — Other Ambulatory Visit: Payer: Self-pay | Admitting: Nurse Practitioner

## 2023-08-20 NOTE — Progress Notes (Deleted)
 Assessment and Plan:  Caitlin Barnett was seen today for a ***.  Diagnoses and all order for this visit:  There are no diagnoses linked to this encounter.   Continue to monitor for any increase in fever, chills, N/V, diarrhea, changes to bowel habits, blood in stool.  Notify office for further evaluation and treatment, questions or concerns if s/s fail to improve. The risks and benefits of my recommendations, as well as other treatment options were discussed with the patient today. Questions were answered.  Further disposition pending results of labs. Discussed med's effects and SE's.    Over *** minutes of exam, counseling, chart review, and critical decision making was performed.   Future Appointments  Date Time Provider Department Center  08/21/2023  9:30 AM Laurice President, NP [facility] None  12/29/2023  2:00 PM Tonita Fallow, MD [facility] None    ------------------------------------------------------------------------------------------------------------------   HPI There were no vitals taken for this visit.  Upper Respiratory Infection Patient complains of {uri rfv:14243::symptoms of a URI}. Symptoms include {uri sx:15453}. Onset of symptoms was {1-10:13787} {time; units:18646::days} ago, and has been {clinical course:17::gradually worsening} since that time. Treatment to date: {URI tx:14268}.         Past Medical History:  Diagnosis Date   CHF (congestive heart failure) (HCC)    COVID-19    Diabetes mellitus without complication (HCC)    Diverticulitis    GERD (gastroesophageal reflux disease)    History of kidney stones    Hyperlipidemia    Hypertension    Hypothyroidism    IBS (irritable bowel syndrome)    Insomnia 12/10/2019   Kidney stone    Migraine    MRSA (methicillin resistant staph aureus) culture positive    Pneumonia 11/2017   PONV (postoperative nausea and vomiting)    Thyroid  disease    UTI (urinary tract infection)      Allergies   Allergen Reactions   Tetanus Toxoids Other (See Comments)    Injection site abcess   Sulfa Antibiotics Hives    itching    Current Outpatient Medications on File Prior to Visit  Medication Sig   albuterol  (PROVENTIL ) (2.5 MG/3ML) 0.083% nebulizer solution Take 3 mLs (2.5 mg total) by nebulization every 6 (six) hours as needed for wheezing or shortness of breath.   albuterol  (VENTOLIN  HFA) 108 (90 Base) MCG/ACT inhaler 2 inhalations  15-20  minutes apart every 4 hours as needed to rescue Asthma   ALPRAZolam  (XANAX ) 0.5 MG tablet Take 1/2 - 1 tablet 2 - 3 x /day ONLY if needed for Anxiety Attack &  limit to 5 days /week to avoid Addiction & Dementia                                            /                                                       TAKE                           BY  MOUTH   benzonatate  (TESSALON ) 200 MG capsule Take 1 perle  (200 mg) 3 x/day to Prevent Cough   blood glucose meter kit and supplies KIT Dispense based on patient and insurance preference. Use up to four times daily as directed. (FOR ICD-9 250.00, 250.01).   butalbital -acetaminophen -caffeine  (FIORICET) 50-325-40 MG tablet Take 0.5 tablets by mouth every 4 (four) hours as needed for headache.   cefUROXime  (CEFTIN ) 250 MG tablet Take  1 tablet  2 x/day  with Food for UTI   cetirizine (ZYRTEC) 10 MG tablet Take 10 mg by mouth daily.   Cholecalciferol  1000 units capsule Take 5,000 Units by mouth 2 (two) times daily.    cyanocobalamin  (,VITAMIN B-12,) 1000 MCG/ML injection Inject 1,000 mcg into the muscle every 30 (thirty) days.   escitalopram  (LEXAPRO ) 20 MG tablet TAKE 1 TABLET BY MOUTH DAILY FOR MOOD   esomeprazole  (NEXIUM ) 40 MG capsule TAKE 1 CAPSULE DAILY TO PREVENT HEARTBURN & INDIGESTION   estrogen, conjugated,-medroxyprogesterone (PREMPRO ) 0.45-1.5 MG tablet Take 1 tablet  Daily   furosemide  (LASIX ) 40 MG tablet Take 0.5-1 tablets (20-40 mg total) by mouth daily as needed. NEED OV.  (Patient taking differently: Take 20 mg by mouth daily.)   glucose blood test strip Test blood sugar once daily   JARDIANCE  25 MG TABS tablet TAKE 1 TABLET BY MOUTH DAILY FOR DIABETES   lidocaine  (LIDODERM ) 5 % Place 1 patch onto the skin daily. Remove & Discard patch within 12 hours or as directed by MD   metFORMIN  (GLUCOPHAGE -XR) 500 MG 24 hr tablet TAKE 2 TABLETS 2 X/ DAY WITH MEALS FOR DIABETES   methocarbamol  (ROBAXIN -750) 750 MG tablet Take 1 tablet (750 mg total) by mouth every 8 (eight) hours as needed for muscle spasms.   ondansetron  (ZOFRAN ) 8 MG tablet TAKE 1/2 TO 1 TAB EVERY 6 TO 8 HOURS AS NEEDED FOR NAUSEA OR VOMITING   oxyCODONE -acetaminophen  (PERCOCET/ROXICET) 5-325 MG tablet Take 2 tablets by mouth every 6 (six) hours as needed for severe pain (pain score 7-10).   phenazopyridine  (PYRIDIUM ) 200 MG tablet Take 1 tab up to three times a day as needed for bladder pain. (Patient not taking: Reported on 06/19/2023)   phentermine  (ADIPEX-P ) 37.5 MG tablet Take 1 tablet every morning for Dieting & Weight  Loss   potassium chloride  (KLOR-CON ) 10 MEQ tablet TAKE 1 TABLET BY MOUTH EVERY DAY (Patient taking differently: Take 10 mEq by mouth daily as needed (low potassium).)   pregabalin  (LYRICA ) 200 MG capsule TAKE 1 CAPSULE 3 TIMES A DAY FOR PAINFUL NEUROPATHY   Semaglutide , 2 MG/DOSE, (OZEMPIC , 2 MG/DOSE,) 8 MG/3ML SOPN INJECT 2 MG INTO THE SKIN ONCE A WEEK.   traMADol  (ULTRAM ) 50 MG tablet TAKE 1/2 TO 1 TABLET EVERY 4 HOURS AS NEEDED FOR SEVERE COUGH (Patient not taking: Reported on 06/19/2023)   valACYclovir  (VALTREX ) 1000 MG tablet Take 1 tablet 3 x /day for Fever Blisters / Cold Sores (Patient taking differently: Take 1,000 mg by mouth daily as needed (fever blisters).)   No current facility-administered medications on file prior to visit.    ROS: all negative except what is noted in the HPI.   Physical Exam:  There were no vitals taken for this visit.  General Appearance: NAD.   Awake, conversant and cooperative. Eyes: PERRLA, EOMs intact.  Sclera white.  Conjunctiva without erythema. Sinuses: No frontal/maxillary tenderness.  No nasal discharge. Nares patent.  ENT/Mouth: Ext aud canals clear.  Bilateral TMs w/DOL and without erythema or bulging. Hearing  intact.  Posterior pharynx without swelling or exudate.  Tonsils without swelling or erythema.  Neck: Supple.  No masses, nodules or thyromegaly. Respiratory: Effort is regular with non-labored breathing. Breath sounds are equal bilaterally without rales, rhonchi, wheezing or stridor.  Cardio: RRR with no MRGs. Brisk peripheral pulses without edema.  Abdomen: Active BS in all four quadrants.  Soft and non-tender without guarding, rebound tenderness, hernias or masses. Lymphatics: Non tender without lymphadenopathy.  Musculoskeletal: Full ROM, 5/5 strength, normal ambulation.  No clubbing or cyanosis. Skin: Appropriate color for ethnicity. Warm without rashes, lesions, ecchymosis, ulcers.  Neuro: CN II-XII grossly normal. Normal muscle tone without cerebellar symptoms and intact sensation.   Psych: AO X 3,  appropriate mood and affect, insight and judgment.     BASCOM NECESSARY, NP 10:48 PM [facility]

## 2023-08-21 ENCOUNTER — Ambulatory Visit: Payer: Managed Care, Other (non HMO) | Admitting: Nurse Practitioner

## 2023-08-21 ENCOUNTER — Encounter: Payer: Self-pay | Admitting: Nurse Practitioner

## 2023-08-21 ENCOUNTER — Ambulatory Visit (INDEPENDENT_AMBULATORY_CARE_PROVIDER_SITE_OTHER): Payer: Managed Care, Other (non HMO) | Admitting: Nurse Practitioner

## 2023-08-21 ENCOUNTER — Other Ambulatory Visit: Payer: Self-pay

## 2023-08-21 VITALS — BP 102/70 | HR 96 | Temp 97.9°F | Ht 62.0 in | Wt 152.8 lb

## 2023-08-21 DIAGNOSIS — R051 Acute cough: Secondary | ICD-10-CM

## 2023-08-21 DIAGNOSIS — R0989 Other specified symptoms and signs involving the circulatory and respiratory systems: Secondary | ICD-10-CM

## 2023-08-21 DIAGNOSIS — R062 Wheezing: Secondary | ICD-10-CM | POA: Diagnosis not present

## 2023-08-21 DIAGNOSIS — J0100 Acute maxillary sinusitis, unspecified: Secondary | ICD-10-CM

## 2023-08-21 MED ORDER — AZITHROMYCIN 250 MG PO TABS: ORAL_TABLET | ORAL | 1 refills | Status: AC

## 2023-08-21 MED ORDER — PROMETHAZINE-DM 6.25-15 MG/5ML PO SYRP: 5.0000 mL | ORAL_SOLUTION | Freq: Four times a day (QID) | ORAL | 1 refills | Status: AC | PRN

## 2023-08-21 MED ORDER — PREDNISONE 10 MG PO TABS: ORAL_TABLET | ORAL | 1 refills | Status: AC

## 2023-08-21 MED ORDER — BENZONATATE 200 MG PO CAPS: ORAL_CAPSULE | ORAL | 1 refills | Status: AC

## 2023-08-21 NOTE — Progress Notes (Signed)
 Assessment and Plan:  BIJAL SIGLIN was seen today for an episodic visit.  Diagnoses and all order for this visit:  Acute non-recurrent maxillary sinusitis (Primary) Start Zpak as directed Continue Zyrtec Stay well hydrated to keep mucus thin and productive  - azithromycin  (ZITHROMAX ) 250 MG tablet; Take 2 tablets on  Day 1,  followed by 1 tablet  daily for 4 more days    for Sinusitis  /Bronchitis  Dispense: 6 each; Refill: 1  Acute cough Start Promethazine  cough syrup and Benzonatate  cough perles. Stay well hydrated to keep any mucus thin an d productive. Coughing can be cuased by several factors including   breathing in things that bother (irritate) your lungs.  Allergies.  Asthma.  Mucus that runs down the back of your throat (postnasal drip).  Smoking/smoke.  Acid backing up from the stomach into the tube that moves food from the mouth to the stomach (gastroesophageal reflux). A cough can linger for 3 weeks. Watch for any changes in your cough and contact office if noticed including blood, pus, pain, night sweats. Cover your mouth when you cough. If the air is dry, use a cool mist vaporizer or humidifier in your home. If your cough is worse at night, try using extra pillows to raise your head up higher while you sleep. Call 911 or report to ER if you start to have difficulty breathing.   - promethazine -dextromethorphan (PROMETHAZINE -DM) 6.25-15 MG/5ML syrup; Take 5 mLs by mouth 4 (four) times daily as needed for cough.  Dispense: 240 mL; Refill: 1 - benzonatate  (TESSALON ) 200 MG capsule; Take 1 perle 3 x / day to prevent cough  Dispense: 30 capsule; Refill: 1  Wheezing Start steroid taper as directed Continue Zyrtec Continue Ventolin  nd Proventil  nebulizer as needed as directed.  - predniSONE  (DELTASONE ) 10 MG tablet; 1 tab 3 x day for 2 days, then 1 tab 2 x day for 2 days, then 1 tab 1 x day for 3 days  Dispense: 13 tablet; Refill: 1  Labile  hypertension Controlled Discussed DASH (Dietary Approaches to Stop Hypertension) DASH diet is lower in sodium than a typical American diet. Cut back on foods that are high in saturated fat, cholesterol, and trans fats. Eat more whole-grain foods, fish, poultry, and nuts Remain active and exercise as tolerated daily.  Monitor BP at home-Call if greater than 130/80.    Notify office for further evaluation and treatment, questions or concerns if s/s fail to improve. The risks and benefits of my recommendations, as well as other treatment options were discussed with the patient today. Questions were answered.  Further disposition pending results of labs. Discussed med's effects and SE's.    Over 20 minutes of exam, counseling, chart review, and critical decision making was performed.   Future Appointments  Date Time Provider Department Center  12/29/2023  2:00 PM Tonita Fallow, MD [facility] None    ------------------------------------------------------------------------------------------------------------------   HPI BP 102/70   Pulse 96   Temp 97.9 F (36.6 C)   Ht 5' 2 (1.575 m)   Wt 152 lb 12.8 oz (69.3 kg)   SpO2 98%   BMI 27.95 kg/m    Patient complains of symptoms of a URI, possible sinusitis. Symptoms include left ear pressure/pain, congestion, cough described as productive, headache described as pressure, nasal congestion, post nasal drip, sinus pressure, sneezing, sore throat, and wheezing. Onset of symptoms was 2 weeks ago, and has been unchanged since that time. Treatment to date: antihistamines, decongestants, and inhaler and  nebulizer .  Overall BP is stable.   BP Readings from Last 3 Encounters:  08/21/23 102/70  06/19/23 122/70  06/19/23 102/80   Past Medical History:  Diagnosis Date   CHF (congestive heart failure) (HCC)    COVID-19    Diabetes mellitus without complication (HCC)    Diverticulitis    GERD (gastroesophageal reflux disease)    History  of kidney stones    Hyperlipidemia    Hypertension    Hypothyroidism    IBS (irritable bowel syndrome)    Insomnia 12/10/2019   Kidney stone    Migraine    MRSA (methicillin resistant staph aureus) culture positive    Pneumonia 11/2017   PONV (postoperative nausea and vomiting)    Thyroid  disease    UTI (urinary tract infection)      Allergies  Allergen Reactions   Tetanus Toxoids Other (See Comments)    Injection site abcess   Sulfa Antibiotics Hives    itching    Current Outpatient Medications on File Prior to Visit  Medication Sig   albuterol  (PROVENTIL ) (2.5 MG/3ML) 0.083% nebulizer solution Take 3 mLs (2.5 mg total) by nebulization every 6 (six) hours as needed for wheezing or shortness of breath.   albuterol  (VENTOLIN  HFA) 108 (90 Base) MCG/ACT inhaler 2 inhalations  15-20  minutes apart every 4 hours as needed to rescue Asthma   ALPRAZolam  (XANAX ) 0.5 MG tablet Take 1/2 - 1 tablet 2 - 3 x /day ONLY if needed for Anxiety Attack &  limit to 5 days /week to avoid Addiction & Dementia                                            /                                                       TAKE                           BY                           MOUTH   benzonatate  (TESSALON ) 200 MG capsule Take 1 perle  (200 mg) 3 x/day to Prevent Cough   blood glucose meter kit and supplies KIT Dispense based on patient and insurance preference. Use up to four times daily as directed. (FOR ICD-9 250.00, 250.01).   butalbital -acetaminophen -caffeine  (FIORICET) 50-325-40 MG tablet Take 0.5 tablets by mouth every 4 (four) hours as needed for headache.   cefUROXime  (CEFTIN ) 250 MG tablet Take  1 tablet  2 x/day  with Food for UTI   cetirizine (ZYRTEC) 10 MG tablet Take 10 mg by mouth daily.   Cholecalciferol  1000 units capsule Take 5,000 Units by mouth 2 (two) times daily.    cyanocobalamin  (,VITAMIN B-12,) 1000 MCG/ML injection Inject 1,000 mcg into the muscle every 30 (thirty) days.   escitalopram   (LEXAPRO ) 20 MG tablet TAKE 1 TABLET BY MOUTH DAILY FOR MOOD   esomeprazole  (NEXIUM ) 40 MG capsule TAKE 1 CAPSULE DAILY TO PREVENT HEARTBURN & INDIGESTION   estrogen, conjugated,-medroxyprogesterone (PREMPRO ) 0.45-1.5 MG tablet Take 1 tablet  Daily  furosemide  (LASIX ) 40 MG tablet Take 0.5-1 tablets (20-40 mg total) by mouth daily as needed. NEED OV. (Patient taking differently: Take 20 mg by mouth daily.)   glucose blood test strip Test blood sugar once daily   JARDIANCE  25 MG TABS tablet TAKE 1 TABLET BY MOUTH DAILY FOR DIABETES   lidocaine  (LIDODERM ) 5 % Place 1 patch onto the skin daily. Remove & Discard patch within 12 hours or as directed by MD   metFORMIN  (GLUCOPHAGE -XR) 500 MG 24 hr tablet TAKE 2 TABLETS 2 X/ DAY WITH MEALS FOR DIABETES   methocarbamol  (ROBAXIN -750) 750 MG tablet Take 1 tablet (750 mg total) by mouth every 8 (eight) hours as needed for muscle spasms.   ondansetron  (ZOFRAN ) 8 MG tablet TAKE 1/2 TO 1 TAB EVERY 6 TO 8 HOURS AS NEEDED FOR NAUSEA OR VOMITING   oxyCODONE -acetaminophen  (PERCOCET/ROXICET) 5-325 MG tablet Take 2 tablets by mouth every 6 (six) hours as needed for severe pain (pain score 7-10).   phenazopyridine  (PYRIDIUM ) 200 MG tablet Take 1 tab up to three times a day as needed for bladder pain.   phentermine  (ADIPEX-P ) 37.5 MG tablet Take 1 tablet every morning for Dieting & Weight  Loss   potassium chloride  (KLOR-CON ) 10 MEQ tablet TAKE 1 TABLET BY MOUTH EVERY DAY (Patient taking differently: Take 10 mEq by mouth daily as needed (low potassium).)   pregabalin  (LYRICA ) 200 MG capsule TAKE 1 CAPSULE 3 TIMES A DAY FOR PAINFUL NEUROPATHY   Semaglutide , 2 MG/DOSE, (OZEMPIC , 2 MG/DOSE,) 8 MG/3ML SOPN INJECT 2 MG INTO THE SKIN ONCE A WEEK.   traMADol  (ULTRAM ) 50 MG tablet TAKE 1/2 TO 1 TABLET EVERY 4 HOURS AS NEEDED FOR SEVERE COUGH   valACYclovir  (VALTREX ) 1000 MG tablet Take 1 tablet 3 x /day for Fever Blisters / Cold Sores (Patient taking differently: Take 1,000 mg  by mouth daily as needed (fever blisters).)   No current facility-administered medications on file prior to visit.    ROS: all negative except what is noted in the HPI.   Physical Exam:  BP 102/70   Pulse 96   Temp 97.9 F (36.6 C)   Ht 5' 2 (1.575 m)   Wt 152 lb 12.8 oz (69.3 kg)   SpO2 98%   BMI 27.95 kg/m   General Appearance: NAD.  Awake, conversant and cooperative. Eyes: PERRLA, EOMs intact.  Sclera white.  Conjunctiva without erythema. Sinuses: Frontal/maxillary tenderness.  No nasal discharge. Nares patent.  ENT/Mouth: Ext aud canals clear.  Bilateral TMs w/DOL and without erythema or bulging. Hearing intact.  Posterior pharynx without swelling or exudate.  Tonsils without swelling or erythema.  Neck: Supple.  No masses, nodules or thyromegaly. Respiratory: Effort is regular with non-labored breathing. Breath sounds are equal bilaterally with scattered wheezing throughout.  Cardio: RRR with no MRGs. Brisk peripheral pulses without edema.  Abdomen: Active BS in all four quadrants.  Soft and non-tender without guarding, rebound tenderness, hernias or masses. Lymphatics: Non tender without lymphadenopathy.  Musculoskeletal: Full ROM, 5/5 strength, normal ambulation.  No clubbing or cyanosis. Skin: Appropriate color for ethnicity. Warm without rashes, lesions, ecchymosis, ulcers.  Neuro: CN II-XII grossly normal. Normal muscle tone without cerebellar symptoms and intact sensation.   Psych: AO X 3,  appropriate mood and affect, insight and judgment.     BASCOM NECESSARY, NP 12:28 PM [facility]

## 2023-08-21 NOTE — Patient Instructions (Signed)

## 2023-09-14 ENCOUNTER — Other Ambulatory Visit: Payer: Self-pay | Admitting: Nurse Practitioner

## 2023-09-16 ENCOUNTER — Other Ambulatory Visit: Payer: Self-pay | Admitting: Nurse Practitioner

## 2023-09-16 DIAGNOSIS — E6609 Other obesity due to excess calories: Secondary | ICD-10-CM

## 2023-09-22 ENCOUNTER — Other Ambulatory Visit: Payer: Self-pay | Admitting: Nurse Practitioner

## 2023-09-22 DIAGNOSIS — E6609 Other obesity due to excess calories: Secondary | ICD-10-CM

## 2023-09-22 DIAGNOSIS — E1140 Type 2 diabetes mellitus with diabetic neuropathy, unspecified: Secondary | ICD-10-CM

## 2023-09-22 MED ORDER — PREGABALIN 200 MG PO CAPS: ORAL_CAPSULE | ORAL | 0 refills | Status: AC

## 2023-09-22 MED ORDER — PHENTERMINE HCL 37.5 MG PO TABS: ORAL_TABLET | ORAL | 0 refills | Status: AC

## 2023-10-01 ENCOUNTER — Other Ambulatory Visit: Payer: Self-pay

## 2023-10-01 MED ORDER — ONDANSETRON HCL 8 MG PO TABS: ORAL_TABLET | ORAL | 2 refills | Status: AC

## 2023-10-07 ENCOUNTER — Other Ambulatory Visit: Payer: Self-pay

## 2023-10-07 MED ORDER — ESCITALOPRAM OXALATE 20 MG PO TABS: ORAL_TABLET | ORAL | 3 refills | Status: AC

## 2023-10-27 ENCOUNTER — Other Ambulatory Visit: Payer: Self-pay

## 2023-10-28 MED ORDER — ALPRAZOLAM 0.5 MG PO TABS: ORAL_TABLET | ORAL | 0 refills | Status: AC

## 2023-12-04 ENCOUNTER — Other Ambulatory Visit: Payer: Self-pay | Admitting: Family Medicine

## 2023-12-04 DIAGNOSIS — Z122 Encounter for screening for malignant neoplasm of respiratory organs: Secondary | ICD-10-CM

## 2023-12-16 ENCOUNTER — Ambulatory Visit
Admission: RE | Admit: 2023-12-16 | Discharge: 2023-12-16 | Disposition: A | Discharge status: Home / Self Care | Source: Ambulatory Visit | Attending: Family Medicine | Admitting: Family Medicine

## 2023-12-16 DIAGNOSIS — Z122 Encounter for screening for malignant neoplasm of respiratory organs: Secondary | ICD-10-CM

## 2023-12-17 ENCOUNTER — Other Ambulatory Visit: Payer: Self-pay | Admitting: Nurse Practitioner

## 2023-12-17 DIAGNOSIS — Z1231 Encounter for screening mammogram for malignant neoplasm of breast: Secondary | ICD-10-CM

## 2023-12-17 DIAGNOSIS — E2839 Other primary ovarian failure: Secondary | ICD-10-CM

## 2023-12-24 ENCOUNTER — Encounter: Payer: Managed Care, Other (non HMO) | Admitting: Internal Medicine

## 2023-12-29 ENCOUNTER — Encounter: Payer: Managed Care, Other (non HMO) | Admitting: Internal Medicine

## 2024-01-07 ENCOUNTER — Ambulatory Visit
Admission: RE | Admit: 2024-01-07 | Discharge: 2024-01-07 | Disposition: A | Discharge status: Home / Self Care | Source: Ambulatory Visit | Attending: Nurse Practitioner | Admitting: Nurse Practitioner

## 2024-01-07 DIAGNOSIS — Z1231 Encounter for screening mammogram for malignant neoplasm of breast: Secondary | ICD-10-CM

## 2024-05-06 ENCOUNTER — Ambulatory Visit (HOSPITAL_BASED_OUTPATIENT_CLINIC_OR_DEPARTMENT_OTHER)
Admission: RE | Admit: 2024-05-06 | Discharge: 2024-05-06 | Disposition: A | Discharge status: Home / Self Care | Source: Ambulatory Visit | Attending: Nurse Practitioner | Admitting: Nurse Practitioner

## 2024-05-06 DIAGNOSIS — E2839 Other primary ovarian failure: Secondary | ICD-10-CM | POA: Insufficient documentation

## 2024-06-14 ENCOUNTER — Other Ambulatory Visit: Payer: Self-pay | Admitting: Family

## 2024-09-07 ENCOUNTER — Other Ambulatory Visit
